# Patient Record
Sex: Female | Born: 1940 | Race: White | Hispanic: No | Marital: Married | Smoking: Never smoker
Health system: Southern US, Community
[De-identification: ages and names within clinical notes are randomized; demographics above are authoritative.]

## PROBLEM LIST (undated history)

## (undated) DIAGNOSIS — Z9221 Personal history of antineoplastic chemotherapy: Secondary | ICD-10-CM

## (undated) DIAGNOSIS — R0600 Dyspnea, unspecified: Secondary | ICD-10-CM

## (undated) DIAGNOSIS — Z9289 Personal history of other medical treatment: Secondary | ICD-10-CM

## (undated) DIAGNOSIS — Z923 Personal history of irradiation: Secondary | ICD-10-CM

## (undated) DIAGNOSIS — F419 Anxiety disorder, unspecified: Secondary | ICD-10-CM

## (undated) DIAGNOSIS — D649 Anemia, unspecified: Secondary | ICD-10-CM

## (undated) DIAGNOSIS — R011 Cardiac murmur, unspecified: Secondary | ICD-10-CM

## (undated) DIAGNOSIS — K5900 Constipation, unspecified: Secondary | ICD-10-CM

## (undated) DIAGNOSIS — R0602 Shortness of breath: Principal | ICD-10-CM

## (undated) DIAGNOSIS — M51369 Other intervertebral disc degeneration, lumbar region without mention of lumbar back pain or lower extremity pain: Secondary | ICD-10-CM

## (undated) DIAGNOSIS — D0510 Intraductal carcinoma in situ of unspecified breast: Secondary | ICD-10-CM

## (undated) DIAGNOSIS — M5136 Other intervertebral disc degeneration, lumbar region: Secondary | ICD-10-CM

## (undated) DIAGNOSIS — M1990 Unspecified osteoarthritis, unspecified site: Secondary | ICD-10-CM

## (undated) DIAGNOSIS — K219 Gastro-esophageal reflux disease without esophagitis: Secondary | ICD-10-CM

## (undated) DIAGNOSIS — C50411 Malignant neoplasm of upper-outer quadrant of right female breast: Secondary | ICD-10-CM

## (undated) DIAGNOSIS — I10 Essential (primary) hypertension: Secondary | ICD-10-CM

## (undated) DIAGNOSIS — E785 Hyperlipidemia, unspecified: Secondary | ICD-10-CM

## (undated) HISTORY — PX: ABDOMINAL HYSTERECTOMY: SHX81

## (undated) HISTORY — DX: Shortness of breath: R06.02

## (undated) HISTORY — DX: Intraductal carcinoma in situ of unspecified breast: D05.10

## (undated) HISTORY — DX: Unspecified osteoarthritis, unspecified site: M19.90

## (undated) HISTORY — DX: Gastro-esophageal reflux disease without esophagitis: K21.9

## (undated) HISTORY — DX: Hyperlipidemia, unspecified: E78.5

## (undated) HISTORY — DX: Malignant neoplasm of upper-outer quadrant of right female breast: C50.411

## (undated) HISTORY — DX: Essential (primary) hypertension: I10

## (undated) HISTORY — PX: OTHER SURGICAL HISTORY: SHX169

---

## 1999-03-01 HISTORY — PX: JOINT REPLACEMENT: SHX530

## 2000-05-30 ENCOUNTER — Encounter: Payer: Self-pay | Admitting: Orthopedic Surgery

## 2000-06-05 ENCOUNTER — Inpatient Hospital Stay (HOSPITAL_COMMUNITY): Admission: RE | Admit: 2000-06-05 | Discharge: 2000-06-10 | Payer: Self-pay | Admitting: Orthopedic Surgery

## 2001-02-28 HISTORY — PX: JOINT REPLACEMENT: SHX530

## 2002-02-28 HISTORY — PX: JOINT REPLACEMENT: SHX530

## 2005-05-18 ENCOUNTER — Ambulatory Visit: Payer: Self-pay | Admitting: Internal Medicine

## 2006-07-03 ENCOUNTER — Ambulatory Visit: Payer: Self-pay | Admitting: Internal Medicine

## 2006-07-10 ENCOUNTER — Inpatient Hospital Stay (HOSPITAL_COMMUNITY): Admission: RE | Admit: 2006-07-10 | Discharge: 2006-07-13 | Payer: Self-pay | Admitting: Orthopedic Surgery

## 2007-03-01 HISTORY — PX: COLONOSCOPY: SHX174

## 2007-07-11 ENCOUNTER — Ambulatory Visit: Payer: Self-pay | Admitting: Internal Medicine

## 2007-07-25 ENCOUNTER — Ambulatory Visit: Payer: Self-pay | Admitting: Internal Medicine

## 2007-08-03 ENCOUNTER — Ambulatory Visit: Payer: Self-pay | Admitting: General Surgery

## 2007-08-17 ENCOUNTER — Ambulatory Visit: Payer: Self-pay | Admitting: General Surgery

## 2007-09-04 ENCOUNTER — Other Ambulatory Visit: Payer: Self-pay

## 2007-09-04 ENCOUNTER — Ambulatory Visit: Payer: Self-pay | Admitting: General Surgery

## 2007-09-11 ENCOUNTER — Ambulatory Visit: Payer: Self-pay | Admitting: General Surgery

## 2008-03-17 ENCOUNTER — Ambulatory Visit: Payer: Self-pay | Admitting: Gastroenterology

## 2008-08-20 ENCOUNTER — Ambulatory Visit: Payer: Self-pay | Admitting: Internal Medicine

## 2008-11-27 ENCOUNTER — Inpatient Hospital Stay (HOSPITAL_COMMUNITY): Admission: RE | Admit: 2008-11-27 | Discharge: 2008-11-29 | Payer: Self-pay | Admitting: Orthopedic Surgery

## 2009-09-22 ENCOUNTER — Ambulatory Visit: Payer: Self-pay | Admitting: Internal Medicine

## 2010-01-29 ENCOUNTER — Encounter: Admission: RE | Admit: 2010-01-29 | Discharge: 2010-01-29 | Payer: Self-pay | Admitting: Orthopedic Surgery

## 2010-02-28 HISTORY — PX: BREAST BIOPSY: SHX20

## 2010-02-28 HISTORY — PX: CHOLECYSTECTOMY: SHX55

## 2010-06-03 LAB — BASIC METABOLIC PANEL
BUN: 14 mg/dL (ref 6–23)
BUN: 15 mg/dL (ref 6–23)
CO2: 28 mEq/L (ref 19–32)
Calcium: 8.3 mg/dL — ABNORMAL LOW (ref 8.4–10.5)
Chloride: 104 mEq/L (ref 96–112)
Chloride: 106 mEq/L (ref 96–112)
Creatinine, Ser: 0.91 mg/dL (ref 0.4–1.2)
GFR calc Af Amer: >60 mL/min (ref >60)
GFR calc non Af Amer: >60 mL/min (ref >60)
Glucose, Bld: 112 mg/dL — ABNORMAL HIGH (ref 70–99)
Glucose, Bld: 91 mg/dL (ref 70–99)
Potassium: 3.8 mEq/L (ref 3.5–5.1)
Potassium: 3.9 mEq/L (ref 3.5–5.1)
Sodium: 137 mEq/L (ref 135–145)

## 2010-06-03 LAB — CBC
HCT: 32 % — ABNORMAL LOW (ref 36.0–46.0)
HCT: 35.3 % — ABNORMAL LOW (ref 36.0–46.0)
Hemoglobin: 12.1 g/dL (ref 12.0–15.0)
MCHC: 34.4 g/dL (ref 30.0–36.0)
MCV: 94.5 fL (ref 78.0–100.0)
MCV: 95.9 fL (ref 78.0–100.0)
Platelets: 117 10*3/uL — ABNORMAL LOW (ref 150–400)
Platelets: 151 10*3/uL (ref 150–400)
RBC: 3.73 MIL/uL — ABNORMAL LOW (ref 3.87–5.11)
RDW: 13.1 % (ref 11.5–15.5)
RDW: 13.3 % (ref 11.5–15.5)
WBC: 15.3 10*3/uL — ABNORMAL HIGH (ref 4.0–10.5)

## 2010-06-04 LAB — COMPREHENSIVE METABOLIC PANEL
AST: 37 U/L (ref 0–37)
Albumin: 3.6 g/dL (ref 3.5–5.2)
Chloride: 104 mEq/L (ref 96–112)
Creatinine, Ser: 1.06 mg/dL (ref 0.4–1.2)
GFR calc Af Amer: >60 mL/min (ref >60)
Total Bilirubin: 1.6 mg/dL — ABNORMAL HIGH (ref 0.3–1.2)
Total Protein: 6.5 g/dL (ref 6.0–8.3)

## 2010-06-04 LAB — DIFFERENTIAL
Basophils Absolute: 0 10*3/uL (ref 0.0–0.1)
Basophils Relative: 0 % (ref 0–1)
Eosinophils Relative: 1 % (ref 0–5)
Lymphocytes Relative: 27 % (ref 12–46)
Neutro Abs: 4.8 10*3/uL (ref 1.7–7.7)

## 2010-06-04 LAB — URINALYSIS, ROUTINE W REFLEX MICROSCOPIC
Nitrite: NEGATIVE
Specific Gravity, Urine: 1.028 (ref 1.005–1.030)
Urobilinogen, UA: 0.2 mg/dL (ref 0.0–1.0)
pH: 6 (ref 5.0–8.0)

## 2010-06-04 LAB — CBC
HCT: 42.9 % (ref 36.0–46.0)
MCHC: 34.3 g/dL (ref 30.0–36.0)
Platelets: 168 10*3/uL (ref 150–400)
RDW: 13.1 % (ref 11.5–15.5)

## 2010-06-04 LAB — APTT: aPTT: 25 seconds (ref 24–37)

## 2010-06-04 LAB — BASIC METABOLIC PANEL
Calcium: 9.3 mg/dL (ref 8.4–10.5)
GFR calc Af Amer: 56 mL/min — ABNORMAL LOW (ref >60)
GFR calc non Af Amer: 47 mL/min — ABNORMAL LOW (ref >60)
Potassium: 3.3 mEq/L — ABNORMAL LOW (ref 3.5–5.1)
Sodium: 142 mEq/L (ref 135–145)

## 2010-06-04 LAB — CROSSMATCH: ABO/RH(D): O POS

## 2010-06-04 LAB — PROTIME-INR: INR: 0.9 (ref 0.00–1.49)

## 2010-07-16 NOTE — H&P (Signed)
Maria Gutierrez, Maria Gutierrez                 ACCOUNT NO.:  [id number]   MEDICAL RECORD NO.:  [id number]          PATIENT TYPE:  INP   LOCATION:  NA                           FACILITY:  [facility]   PHYSICIAN:  John L. Rendall, M.D.  DATE OF BIRTH:  [birth year]   DATE OF ADMISSION:  07/10/2006  DATE OF DISCHARGE:                              HISTORY & PHYSICAL   CHIEF COMPLAINT:  Painful left total knee replacement.   HISTORY OF PRESENT ILLNESS:  [name] is a 70 year old white female  with history of left total knee arthroplasty by Dr. Priscille Kluver in 2002.  The patient did well until the summer of 2007, when she started  developing left knee pain.  She denies any injury.  Pains became  progressively worse over the last year.  She describes the pain as achy  pain, worse with ambulation, better with Darvocet and crutches.  Labs  were obtained to rule out infection, i.e., sed rate and CRP, and these  were within normal limits.  X-rays of the left knee, however, showed  loosening of tibial component which has failed and now is in varus.   ALLERGIES:  1. VICODIN causes edema.  2. With PERCOCET, she feels she has an allergy, but is  unsure of what      the allergy is.  3. DEMEROL causes rash.  4. DILAUDID, she is unsure of allergy.  Feels she has allergy.  5. SULFA, again unsure, but has been told she has an allergy.   CURRENT MEDICATIONS:  1. Metoprolol ER 25 mg daily.  2. Meloxicam 15 mg one daily.  3. Hydrochlorothiazide 12.5 mg daily.  4. Darvocet-N 100, she takes daily q.4-6 h.  5. Lipitor 20 mg daily.  6. 81 mg of aspirin daily, stopped on June 03, 2006.  7. Caltrate 600 plus D 1 daily.  8. Allegra 180 mg as needed.  9. Fish oil 1000 mg twice daily.  10.Pepcid OTC p.r.n.   PAST MEDICAL HISTORY:  1. Essential hypertension.  2. Hyperlipidemia.  3. Reflux esophagitis.  4. Obesity.   PAST SURGICAL HISTORY:  1. Left total knee replacement in 2002, Dr. Priscille Kluver.  2. Hysterectomy.  3. Knee  arthroscopy, twice on the left knee and once on the right      knee. All knee procedures done by Dr. Priscille Kluver.  4. The patient denies any complications with anesthesia with the above      procedures.  Has had a blood transfusion with the left total knee      replacement in 2002.   SOCIAL HISTORY:  She denies any tobacco or alcohol use.  She lives with  her husband in a three-story home, two steps to regular entrance.   FAMILY HISTORY:  Mother deceased at age 64, history of heart disease,  MI, and stroke.  Father deceased at age 12 with heart disease and  history of stroke.  She has one living brother, age approximately 86,  hypertension. Two living sisters, age 20 with hypertension, coronary  artery disease with stent placement, and a 69 year old who is stated to  be in good health.   REVIEW OF SYSTEMS:  Reveals that the patient wears glasses at all times.  She has hypertension.  She has a history of anxiety, panic attacks. She  denies any recent fevers, chills or flu-like symptoms.  She does suffer  from recurrent sinus infections.  Otherwise, review of systems negative  or noncontributory.  Normal stress Cardiolite negative in October 2007  reported by her primary care physician.   PHYSICAL EXAMINATION:  GENERAL:  Patient is a well-developed, well-  nourished female in no acute distress.  She ambulates with the aid of a  crutch on the right side.  The patient's mood and affect are  appropriate.  She talks easily with the examiner.  VITAL SIGNS:  Temperature 99.2, pulse is 70, blood pressure 120/80,  respiratory rate 16.  CARDIAC:  Regular rate and rhythm.  No murmurs, rubs, or gallops noted.  LUNGS:  Clear to auscultation bilaterally.  No wheezing, rhonchi, or  rales noted.  ABDOMEN:  Obese.  Bowel sounds x4 quadrants.  Soft, nontender  throughout.  HEENT: Head is normocephalic, atraumatic, without frontal or maxillary  sinus tenderness to palpation.  Conjunctiva is pink.  Sclerae  is  anicteric.  PERLA.  EOMs are intact.  No external ear deformities.  TMs  pearly and gray bilaterally.  Nose: Nasal septum midline.  Nasal mucosa  pink, moist, without polyps.  Buccal mucosa pink, moist.  The patient  has good dentition.  Pharynx without erythema or exudate.  Tongue, uvula  midline.  NECK:  Carotids are 2+ bilaterally, without bruits.  No tenderness with  palpation over the midline.  She has full range of motion of the  cervical spine without pain.  BACK:  N tenderness to palpation over the thoracic or lumbar spine.[name]  NEUROLOGIC:  Patient is alert and oriented x3.  Cranial nerves II-XII  grossly intact.  Lower extremity strength testing reveals 5/5 strength  throughout.  Deep tendon reflexes are 2+ bilaterally at the knees and  ankles and are equal and symmetric.  She has good sensation to light  touch throughout.  BREASTS/GENITOURINARY/RECTAL:  All deferred at this time.  MUSCULOSKELETAL:  She has full range of motion of the upper extremities,  without pain.  Upper extremities are equal and symmetric in size and  shape throughout.  Radial pulses are 2+.  LOWER EXTREMITIES:  She has full painless range of motion of both hips.  Both hips flex to 90 degrees easily without pain.  Left knee:  0-95  degrees of flexion. Well-healed surgical incision.  She has tenderness  along the medial joint line.  Valgus and varus stressing reveals no  instability.  No effusion.  No edema is noted.  Right knee:  0-97  degrees of flexion.  5 degrees of varus deformity.  She has tenderness  along the medial joint line to palpation.  No effusion.  No edema.  Valgus and varus stressing reveals no laxity.  Calves nontender  bilaterally.  Lower extremities are nonedematous.  Dorsal pedal pulses  are 2+ bilaterally.  Posterior tibial pulse are 2+ bilaterally, equal  and symmetric.   IMPRESSION: 1. Failed left total knee arthroplasty, with loosening of the tibial      component.  2. Benign  essential hypertension.  3. Esophageal reflux.  4. Hyperlipidemia.  5. Obesity.  6. Osteoarthritis right knee.  7. Anxiety disorder.   PLAN:  The patient is to be admitted on Jul 10, 2006 to [facility]  to undergo a revision of her left total knee.  Prior to  surgery, the patient did receive surgical clearance from Dr. Yves Dill  from [place], [place].  The patient has undergone preoperative  lab testing prior to surgery.      Richardean Canal, P.A.      John L. Rendall, M.D.  Electronically Signed    GC/MEDQ  D:  06/29/2006  T:  06/29/2006  Job:  [id number]

## 2010-07-16 NOTE — H&P (Signed)
[place]  Patient:    Maria Gutierrez, Maria Gutierrez                          MRN: [id number] Adm. Date:  06/05/00 Attending:  Jonny Ruiz L. Dorothyann Gibbs, M.D. Dictator:   Arnoldo Morale, P.A.C.                         History and Physical  DATE OF BIRTH:  May 23, 1940  CHIEF COMPLAINT:  Progressively worsening left knee pain.  HISTORY OF PRESENT ILLNESS:  This 70 year old white female patient presented to Dr. Priscille Kluver with a long history of bilateral knee pain.  At this point, the left is much worse than the right.  She has a history of three knee arthroscopies on the left by Dr. Priscille Kluver, with her last one being in 1998. Since 1998, the pain in her left knee has been getting progressively worse.  At this point, the pain in the left knee is described as an intermittent throb located mostly about the medial aspect of the joint with radiation into her posterior calf.  It increases with any walking, and decreases with rest and the use of a jacuzzi on the knee.  The pain does increase at night.  She has a lot of stiffness in the knee, especially after standing for any length of time.  The knee does pop, catch, grind, lock, and give way at times.  She does have to use a crutch to ambulate p.r.n.  She has no edema or paresthesias associated with the pain.  She did get cortisone shots in both knees in March or April, and that only helped for a few days.  She is currently taking one Darvocet two to three times a day for relief of the pain, in addition to Vioxx.  ALLERGIES:  VICODIN caused edema of her lips.  CURRENT MEDICATIONS:  1. Vioxx 25 mg one tablet p.o. q.d.  2. Zestoretic 20/12 mg one tablet p.o. q.d.  3. Toprol XL 50 mg one tablet p.o. q.d.  4. Lipitor 10 mg one tablet p.o. q.d.  5. Zyrtec 10 mg one tablet p.o. q.d. p.r.n. congestion.  6. Darvocet-N 100 mg one tablet p.o. b.i.d. to t.i.d. p.r.n. pain.  7. Vivelle estrogen patch 0.05 mg one patch applied q.d.  8.  Cafergot tablets one tablet p.o. p.r.n. migraine headaches.  9. Ecotrin 81 mg one tablet p.o. q.d., last dose May 30, 2000. 10. Flonase nasal spray 50 mcg per spray 2 sprays in each naris inhaled q.d.  PAST MEDICAL HISTORY: 1. She was diagnosed with hypertension a year ago. 2. History of migraines, but none since she has been using the Darvocet and    Vioxx. 3. History of allergy and sinus congestion. 4. History of hypercholesterolemia.  She denies any history of diabetes mellitus, thyroid disease, hiatal hernia, peptic ulcer disease, heart disease, asthma, or any other chronic medical condition other than noted previously.  PAST SURGICAL HISTORY: 1. Left knee arthroscopy on November 12, 1985, by Dr. Jonny Ruiz L. Rendall. 2. Left knee arthroscopy on May 25, 1988, by Dr. Jonny Ruiz L. Rendall. 3. Excision of a pump bump on the right heel by Dr. Claude Manges. [place] on    November 16, 1990. 4. Total vaginal hysterectomy by Dr. Annett Fabian on September 13, 1994. 5. Left knee arthroscopy by Dr. Jonny Ruiz L. Rendall on June 26, 1996.  SOCIAL HISTORY:  She denies  any history of cigarette smoking, alcohol use, or drug use.  She is married and has one daughter.  She and her husband live in a one story house with two steps into the main entrance.  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 Her medical doctor is Dr. Margaretann Loveless, and his phone number is [phone].  FAMILY HISTORY:  Her mother is alive at age 22 with heart disease, hypertension, and Alzheimers disease.  Her father is alive at age 10 with heart disease, stroke, and hypertension.  She has one brother age 59 who has hypertension, and two sisters age 49 and 47 who both have hypertension.  Her daughter is age 70 and she is alive and healthy.  REVIEW OF SYSTEMS:  She does have some ear infections occasionally due to excessive build up of ear wax.  She has that removed every couple of  months. She does report a 6 pound weight gain over the last two months.  She does wear glasses.  She does have some urinary urgency.  All other systems are negative and noncontributory at this time.  PHYSICAL EXAMINATION:  GENERAL:  A well-developed, well-nourished, overweight white female in no acute distress.  Talks easily and appropriately with the examiner.  Mood and affect are appropriate.  Height is 5 feet 6 inches, weight 250 pounds, BMI 39.5.  VITAL SIGNS:  Temperature 99.1 degrees Fahrenheit, pulse 64, respirations 20, blood pressure 108/80.  HEENT:  Normocephalic, atraumatic without frontal or maxillary sinus tenderness to palpation.  Conjunctivae pink, sclerae anicteric.  Pupils are equal, round and reactive to light and accommodation.  Extraocular movements intact.  Funduscopic exam shows visible red reflex bilaterally with normal retinal vasculature, optic disks, and cup.  No visible external ear deformities.  Hearing grossly intact.  Tympanic membranes pearly gray bilaterally with good light reflex.  Nose and nasal septum midline.  Mucus membranes moist and pink without exudates or polyps noted.  Buccal mucosa pink and moist.  Good dentition.  Pharynx without erythema or exudates.  Tongue and uvula midline.  Tongue without fasciculations and uvula rises equally with phonation.  NECK:  No visible masses or lesions noted.  Trachea midline.  No palpable lymphadenopathy nor thyromegaly.  Carotids are +2 bilaterally without bruits. Full range of motion and nontender to palpation along the cervical spine.  CARDIOVASCULAR:  Heart rate and rhythm regular.  S1 and S2 present without rubs, clicks, or murmurs noted.  RESPIRATORY:  Respirations even and unlabored.  Breath sounds clear to auscultation bilaterally without rales or wheezes noted.  ABDOMEN:  Rounded abdominal contour.  Bowel sounds present x 4 quadrants. Soft and nontender to palpation without hepatosplenomegaly,  nor CVA tenderness.  Femoral pulses are +2 bilaterally.  Nontender to palpable along the vertebral collum.   BREASTS/GU/RECTAL/PELVIC:  These exams deferred at this time.  MUSCULOSKELETAL:  No obvious deformities, bilateral upper extremities with full range of motion of these extremities without pain.  Radial pulses are +2. She has full range of motion of her hips, ankles, and toes bilaterally with DP and PT pulses +2.  She is only able to bend both hips to about 90 degrees, however, with full extension.  Her left knee has full extension with flexion to 80 degrees.  She has pain on both the medial and lateral joint line with the medial joint line being much more painful than lateral.  There is no effusion at this time.  Minimal crepitus  with range of motion.  Collateral ligaments are stable.  Her right knee is lacking 5 to 10 degrees of full extension, can flex only to 70 degrees at this time.  She has pain only on the medial joint line with palpation.  No effusion.  No crepitus with range of motion, and collateral ligaments stable.  NEUROLOGIC:  Alert and oriented x 3.  Cranial nerves II-XII grossly intact. Strength is 5/5 bilateral upper and lower extremities.  Rapid alternating movements intact.  Deep tendon reflexes 3+ bilateral upper and lower extremities.  Rapid alternating movements intact.  Sensation intact to light touch.  RADIOLOGIC FINDINGS:  X-rays taken of both knees in January 2001, show severe osteoarthritis of both knees with bone-on-bone contact in the medial compartment.  There is a varus deformity and spurs noted medially.  IMPRESSION: 1. End-stage osteoarthritis, bilateral knees, left worse than right. 2. Hypertension. 3. Hypercholesterolemia. 4. History of allergies and sinus congestion. 5. History of migraines.  PLAN:  Maria Gutierrez will be admitted to [facility] on June 05, 2000, where she will undergo a left total knee arthroplasty by Dr. Jonny Ruiz L.  Rendall. She will undergo all the routine preoperative laboratory tests and studies prior to this procedure. DD:  05/30/00 TD:  05/30/00 Job: [id number]

## 2010-07-16 NOTE — Op Note (Signed)
NAMECHENITA, RUDA                 ACCOUNT NO.:  [id number]   MEDICAL RECORD NO.:  [id number]          PATIENT TYPE:  INP   LOCATION:  X001                         FACILITY:  [facility]   PHYSICIAN:  John L. Rendall, M.D.  DATE OF BIRTH:  Mar 21, 1940   DATE OF PROCEDURE:  07/10/2006  DATE OF DISCHARGE:                               OPERATIVE REPORT   PREOPERATIVE DIAGNOSIS:  Failure of tibial component, left total knee.   SURGICAL PROCEDURES:  Revision tibial component and patellar  polyethylene left total knee.   POSTOPERATIVE DIAGNOSIS:  Failure of tibial component, left total knee.   SURGEON:  John L. Rendall, M.D.   ASSISTANTAlisa Graff, PAC   ANESTHESIA:  General.   PATHOLOGY:  The patient has a knee that was in marked varus  preoperatively before her first total knee.  Her total knee done in 2002  was well-aligned and well-fixed, but it has fallen into pretty dermatic  varus with appearance of just crumbling of the medial tibial plateau  beneath the tibial tray.   PROCEDURE:  Under general anesthesia the left leg is prepared with  DuraPrep and draped as a sterile field.  The femoral nerve block was  also obtained.  Using the previous incision the patella was everted  after routine prep and drape.  The tibial tray had the polyethylene  removed and the tibial component then was undermined slightly with an  osteotome and was grossly loose and easily removed.   A synovectomy was carried out and the femoral component showed slight  undermining for about 2 mm circumferentially with synovitis.  However,  it was well-fixed.  There was no evidence of knife blade entering a cyst  or large lucent area under the femoral component.  The patellar  component was also well-fixed.  The patella button was removed.   With the tibial component out large pieces of cement were removed with  an osteotome and mallet.  The center cement plug was removed.  Canal  finder used, reamers were used up to  12 mm and then appropriate clean-up  cut was made removing approximately 50% of the proximal tibia to a depth  of 2-3 mm.  This was in the lateral 50% where there was still bone  present.  The medial side was quite defective.   The decision was made to go with the sleeve, porous sleeve and a step  wedge to repair the bone defect and give the best fit.  Reaming was then  done for the porous sleeve 29 mm.  The step wedge cut was then made and  trialing of the component revealed the step wedge cut was made a little  too low.  Several more millimeters were then removed from the lateral  proximal tibia and trialing then revealed excellent fit, alignment and  stability of the new tibial tray and this was with a 17.5 bearing.  It  should be noted that great care was used to remove all the synovitis and  foreign body reaction.  A Gram's stain was obtained and the report came  back during  the procedure; a few poly's seen, no organisms and culture  and sensitivity were also done but the joint fluid was just slightly  cloudy yellow consistent with foreign body reaction.   At this point with the trial appropriately sized the cement was mixed  with vancomycin 1 gram in each pack.  The permanent component was then  inserted with cement around the proximal tray and the sleeve.  It was  impacted and once properly seated the 17.5 bearing was placed in on top  of it.  The patellar button was placed on the metal backed patella and  the joint capsule was then closed with towel clips while cement  hardened.  Once cement hardened the knee was opened, tourniquet let down  at approximately one hour and 30 minutes.  Multiple small vessels were  cauterized.  A medium Hemovac drain was inserted.  The knee seemed a  little tighter medially than laterally still at this point and partial  release of the pes anserinus corrected this.   At this point the knee was then closed in layers with number one Tycron,  0-0 and  2-0 Vicryl and skin clips.  Total time approximately 2 hours.  The patient tolerated the procedure well and returned to recovery in  good condition.  Arnoldo Morale, PAC assisted in all portions of the  procedure and was there throughout the procedure.      John L. Rendall, M.D.  Electronically Signed     JLR/MEDQ  D:  07/10/2006  T:  07/10/2006  Job:  [id number]

## 2010-07-16 NOTE — Op Note (Signed)
[place]  Patient:    Maria Gutierrez, Maria Gutierrez                        MRN: [id number] Proc. Date: 06/05/00 Adm. Date:  [id number] Attending:  Carolan Shiver Ii                           Operative Report  PREOPERATIVE DIAGNOSIS:  Osteoarthritis left knee.  PROCEDURE:  Left LCS total knee replacement.  POSTOPERATIVE DIAGNOSIS:  Osteoarthritis left knee.  SURGEON:  John L. Dorothyann Gibbs, M.D.  ASSISTANT:  Arnoldo Morale, P.A.  ANESTHESIA:  General anesthesia.  PATHOLOGY:  The patient has end-stage osteoarthritis of the knee with bone against bone medial compartment and significant patellofemoral wear as well. Her knee lacks 7 degrees extension.  DESCRIPTION OF PROCEDURE:  Under general anesthesia, the leg was prepared with Duraprep and draped as a sterile field.  It is wrapped out with an Esmarch and a sterile tourniquet is elevated at 350 mm.  Midline incision is made going medial parapatellar deep.  Patella is everted and multiple small vessels are cauterized.  The femur is sized to the standard.  Proximal tibial resection is carried out using the first femoral guide and then a condylar drill hole is placed using a second guide.  The anterior and posterior flare of the distal femur were resected with a 10 mm flexion gap.  Using the intermedullary guide, a distal femoral cut is made with a 10 mm extension gap.  Recessing guide is then used.  Following this a laminal spreader is inserted.  The remnants of the cruciates and the menisci are resected and spurs of the back of the femoral condyles are removed.  Following this, the proximal tibia is exposed, center peg hole is placed for a 2.5 size tibial tray.  Trial reduction of a 2.5 tibia, 10 mm rotating bearing, and standard femur reveals excellent fit, alignment, and stability.  The patella is then osteotomized and then three peg-hole component is trialed and fits well.  Following this, cement is  mixed. The tibial component is cemented in place.  Femur and patella are press-fit. Following this, the tourniquet is let down at one hour.  Multiple small vessels are cauterized.  The wound is then closed in layers with #1 Tycron, 0 and 2-0 Vicryl, and skin clips.  The patient returned to the recovery room in good condition. DD:  06/05/00 TD:  06/05/00 Job: [id number]

## 2010-07-16 NOTE — Discharge Summary (Signed)
NAMEZOEI, AMISON                 ACCOUNT NO.:  [id number]   MEDICAL RECORD NO.:  [id number]          PATIENT TYPE:  INP   LOCATION:  1610                         FACILITY:  [facility]   PHYSICIAN:  John L. Rendall, M.D.  DATE OF BIRTH:  Feb 02, 1941   DATE OF ADMISSION:  07/10/2006  DATE OF DISCHARGE:  07/13/2006                               DISCHARGE SUMMARY   ADMISSION DIAGNOSES:  1. Failure of the tibial prosthesis.  2. Total knee arthroplasty on left.   DISCHARGE DIAGNOSIS:  1. Failed left total knee arthroplasty, tibia.  2. Essential hypertension.  3. Hyperlipidemia.  4. Reflux esophagitis.  5. Exogenous obesity.   PROCEDURE:  Revision, tibial tray, left total knee.   HISTORY:  Ms. Kitch is a 70 year old white female with a history of  left total knee arthroplasty done by Dr. Priscille Kluver in 2002.  The patient  did well until the summer of 2007, when she started developing pain in  the left knee.  She denies any history of injury or trauma.  Pain became  progressively worse over the last year.  She describes the pain as an  achy pain, worse with ambulation, better with Darvocet and crutches.  Labs were obtained to rule out an infection, and they were normal.  X-  rays, however, showed loosening of the tibial component, which is felt  at now in varus.  Admitted this time for revision.   HOSPITAL COURSE:  A 70 year old female admitted Jul 10, 2006, after  appropriate laboratory studies were obtained, as well as __________  on  call to the operating room.  Was taken to the operating room where she  underwent a revision and total knee arthroplasty of a  tibial plate by  Dr. Erasmo Leventhal, assisted by Nathanial Rancher PA-C.  She tolerated the  procedure well.  She was continued on Ancef 1 gram IV q.6 h. x6 doses.  This was because of a revision nature and the higher chance of  infection.  She was also started on Arixtra 2.5 mg q.8 p.m. x7 days;  Darvocet N-100 1-2 q.4-6 h. __________  6  per day was allowed.  A  reduced dose of morphine PCA was also used.  Consults for PT/OT, care  management were made.  __________  of 0-90 degrees for 6-8 hours per day  was ordered.  Weightbearing as tolerated and PT.  Foley was placed  intraoperatively.  She was allowed out of bed to a chair the following  day.  She had Arixtra instructions.  Genevieve Norlander was ordered for home  health.  Post-op day 2, her dressings were changed, revealing the wounds  to be clean and dry without signs of infection.  She was having some  problems with pain management, until we started her on Darvon 65 mg p.o.  q4 h. p.r.n.  She can use Tylenol 650 mg p.o. q.6 h. p.r.n. pain also.  This has been beneficial.  She, otherwise had an unremarkable hospital  course and is discharged today to follow back up into the office at 2  week's followup.   LABORATORY  STUDIES:  Hemoglobin of 13.8, hematocrit 40.0%, white count  9,000, platelets 218,000.  Discharge hemoglobin 11.2, hematocrit 32.9%,  white count 8,300, platelets 469,000.  Pre-op sodium 141, potassium 4.3,  chloride 104, CO2 29, glucose 102, BUN 21, creatinine 0.3.  GFR was 54,  total protein 7.0, BUN 3.7, AST 23, ALT 18, ALP 75 and total bilirubin  0.7.  Discharge sodium 139, potassium 3.5, chloride 105, CO2 29, glucose  93, BUN 9, creatinine 0.99.  Urinalysis is benign for __________  urine.  Blood type is O positive, antibody screen negative.  Urine cultured  showed 40,000 colonies of mixed bacteria.  Cultures were aerobic and  anaerobic of the left knee or no growth at the time of this dictation.   DISCHARGE INSTRUCTIONS:  She is allowed to increase her activity slowly.  Have a regular diet.  Crutches ambulating, weightbearing as tolerated.  CPM 0-90 degrees or 6 to 8 hours per day.  She may shower on Friday.  No  lifting or driving for 6 weeks.  Follow the blue instruction sheet.  Change dressing daily.  Darvon 65, one tab every 4 hours as needed for  pain.  Arixtra 2.5 mg, take daily at 8:00 p.m. as instructed, last dose  Jul 17, 2006.  Begin aspirin 81 mg daily on Jul 18, 2006.  Robaxin 500  mg, one tablet every 4 hours as needed for spasms.  Lipitor 20 mg, take  one tab each evening.  Metoprolol ER 50 mg, take 1/2 tab daily.  Hydrochlorothiazide 25 mg, take 1/2 tab daily.  Fexofenadine 180, take  one tablet daily as needed.  Caltrate 600, take one daily.  Fish oil  1,000 mg, take one daily.  Pepcid 20 mg, take one daily as needed.   She will need to follow up with Dr. Priscille Kluver on Jul 25, 2006.  She will  need to call the office and obtain an appointment.  [facility] for her home  health.  Discharged in improved condition.      Oris Drone Petrarca, P.A.-C.      Carlisle Beers. Rendall, M.D.  Electronically Signed    BDP/MEDQ  D:  07/13/2006  T:  07/13/2006  Job:  [id number]

## 2010-07-16 NOTE — Discharge Summary (Signed)
[place]  Patient:    Maria Gutierrez, Maria Gutierrez                        MRN: [id number] Adm. Date:  [id number] Disc. Date: [id number] Attending:  Carolan Shiver Ii Dictator:   Arnoldo Morale, P.A.                           Discharge Summary  ADMISSION DIAGNOSES:  1. End-stage osteoarthritis bilateral knees, left worse than right.  2. Hypertension.  3. Hypercholesterolemia.  4. History of allergies and sinus congestion.  5. History of migraines.  DISCHARGE DIAGNOSES:  1. End-stage osteoarthritis bilateral knees, left worse than right.  2. Hypertension.  3. Hypercholesterolemia.  4. History of allergies and sinus congestion.  5. History of migraines.  6. Posthemorrhagic anemia.  7. Orthostatic hypotension.  8. Pruritus and sedation secondary to pain medications.  9. Urinary retention. 10. Constipation.  SURGICAL PROCEDURE:  On June 05, 2000, the patient underwent a left total knee arthroplasty by Dr. Jonny Ruiz L. Rendall, assisted by Arnoldo Morale P.A.C.  COMPLICATIONS:  None.  CONSULTS: 1. Pharmacy consults, Coumadin therapy June 05, 2000. 2. Rehabilitation medicine, case management and physical therapy consult    June 06, 2000. 3. Occupational therapy consult, June 07, 2000.  HISTORY OF PRESENT ILLNESS:  This is a 70 year old white female who presented to Dr. Priscille Kluver with a long history of bilateral knee pain.  At this point, the left is worse than the right.  She has a history of multiple arthroscopies on the left in the past with the last one being in 1998.  The pain in the knee is described as intermittent throb in the medial aspect of the joint with radiation to the posterior calf.  Pain increases with any walking and decreases with rest.  She does have night pain and a lot of stiffness in the knee.  The knee pops, catches, grinds and locks and gives way at time.  She is using a crutch to ambulate p.r.n.  She has failed conservative treatment  at this time and because of this, she is presenting for a left total knee replacement.  HOSPITAL COURSE:  The patient tolerated her surgical procedure well without immediate postoperative complications.  On postoperative day #1, her blood pressure was low, but her hemoglobin was 10.3 with a hematocrit of 29.5.  She was tolerating CPM.  She did complain of some difficulty with pain control and she was placed on Vioxx to assist with pain control.  On postoperative day #2, she did complain of some pruritus with the Percocet. She was switched to Mepergan.  T-max was 100.1, vitals were stable.  Left knee incision well approximated.  Hemoglobin 9.8, hematocrit 28.5.  Her PT was 21 and her INR was 2.4.  She was continued on therapy per protocol.  On June 08, 2000, she had difficulty with increased sedation on the [facility], so she was switched to Dilaudid.  Her T-max was 100.5.  She has been unable to void and required straight cath several times.  She was started on Urecholine 10 mg p.o. t.i.d. for three days.  She did complain of some constipation and was treated with ______ for that.  She did have some difficulty with being light-headed later in the day, so she was subsequently transfused with 2 units of packed red blood cells.  On June 09, 2000, she was feeling a  little bit better.  Constipation had resolved.  T-max was 99.3, vitals were stable.  Hemoglobin 11.2, hematocrit 32.5.  Left knee wound was unchanged.  She was continued on therapy and monitored closely.  On June 10, 2000, she felt well enough to be discharged home later in the day.  She was doing well with therapy and was able to be discharged.  DISCHARGE INSTRUCTIONS: 1. She is to resume all pre-hospitalization medications and diet with the    exception of her Ecotrin and Cafergot. 2. Additional medications now include Coumadin 1 tablet p.o. q.d. with a dose    per pharmacy, Darvocet-N 100 one to two tablets p.o. q.4h.  p.r.n. for pain,    50 with no refill, Vioxx 25 mg one tablet p.o. q.d. 30 with one refill. 3. She is to be weightbearing as tolerated on the left leg with the use of the    walker and is to keep her incision clean and dry. 4. She has arranged for home health physical therapy, RN, and pharmacy to    monitor her protimes, Coumadin therapy, and do her physical therapy. 5. She is to follow up with Dr. Priscille Kluver on Tuesday, April 23 and is to call    [phone] to set up that appointment. 6. She is to notify Dr. Priscille Kluver if temperature greater than 101.5, chills,    pain unrelieved by pain meds or foul smelling drainage from the wound.  LABORATORY DATA:  On June 05, 2000, hemoglobin 11.6, hematocrit 33.8.  April 9, hemoglobin 10.3, hematocrit 29.5.  On April 10, hemoglobin 9.8, hematocrit 28.5, platelets 148.  On April 11, hemoglobin 9.6, hematocrit 27, and on April 12, white count 6.5, hemoglobin 11.2, hematocrit 32.5 and platelets 197.  On April 2, her PT was 12.6, INR was 1, PTT 29.  On April 13, PT 23.4 seconds, INR 2.9.  On April 9, glucose 116, BUN 24, creatinine 1.1.  On April 10, sodium 136, potassium 3.4, chloride 104, CO2 27, glucose 112, BUN 16, creatinine 1.1 and calcium 8.4.  All other laboratory studies were within normal limits. DD:  06/23/00 TD:  06/25/00 Job: [id number]

## 2010-09-14 ENCOUNTER — Ambulatory Visit: Payer: Self-pay | Admitting: Internal Medicine

## 2011-07-28 ENCOUNTER — Other Ambulatory Visit (HOSPITAL_COMMUNITY)
Admission: RE | Admit: 2011-07-28 | Discharge: 2011-07-28 | Disposition: A | Discharge status: Home / Self Care | Payer: Medicare Other | Source: Ambulatory Visit | Attending: Dental General Practice | Admitting: Dental General Practice

## 2011-07-28 DIAGNOSIS — D1039 Benign neoplasm of other parts of mouth: Secondary | ICD-10-CM | POA: Insufficient documentation

## 2011-10-10 ENCOUNTER — Ambulatory Visit: Payer: Self-pay | Admitting: Internal Medicine

## 2012-01-17 DIAGNOSIS — M1990 Unspecified osteoarthritis, unspecified site: Secondary | ICD-10-CM | POA: Insufficient documentation

## 2012-02-29 HISTORY — PX: EYE SURGERY: SHX253

## 2012-10-10 ENCOUNTER — Ambulatory Visit: Payer: Self-pay | Admitting: Family Medicine

## 2012-11-08 ENCOUNTER — Ambulatory Visit: Payer: Self-pay | Admitting: Unknown Physician Specialty

## 2013-06-21 ENCOUNTER — Emergency Department: Payer: Self-pay | Admitting: Emergency Medicine

## 2013-11-18 ENCOUNTER — Ambulatory Visit: Payer: Self-pay | Admitting: Family Medicine

## 2013-11-19 ENCOUNTER — Ambulatory Visit: Payer: Self-pay | Admitting: Unknown Physician Specialty

## 2013-11-20 ENCOUNTER — Ambulatory Visit: Payer: Self-pay | Admitting: Family Medicine

## 2013-12-23 DIAGNOSIS — M1990 Unspecified osteoarthritis, unspecified site: Secondary | ICD-10-CM | POA: Insufficient documentation

## 2014-01-31 DIAGNOSIS — S62319A Displaced fracture of base of unspecified metacarpal bone, initial encounter for closed fracture: Secondary | ICD-10-CM | POA: Insufficient documentation

## 2014-02-28 DIAGNOSIS — Z923 Personal history of irradiation: Secondary | ICD-10-CM

## 2014-02-28 DIAGNOSIS — Z9221 Personal history of antineoplastic chemotherapy: Secondary | ICD-10-CM

## 2014-02-28 HISTORY — DX: Personal history of antineoplastic chemotherapy: Z92.21

## 2014-02-28 HISTORY — DX: Personal history of irradiation: Z92.3

## 2014-05-22 ENCOUNTER — Ambulatory Visit: Payer: Self-pay | Admitting: Family Medicine

## 2014-05-26 ENCOUNTER — Ambulatory Visit: Admit: 2014-05-26 | Disposition: A | Discharge status: Home / Self Care | Payer: Self-pay | Admitting: Family Medicine

## 2014-05-26 HISTORY — PX: BREAST BIOPSY: SHX20

## 2014-06-02 ENCOUNTER — Encounter: Payer: Self-pay | Admitting: General Surgery

## 2014-06-03 ENCOUNTER — Encounter: Payer: Self-pay | Admitting: General Surgery

## 2014-06-03 ENCOUNTER — Ambulatory Visit (INDEPENDENT_AMBULATORY_CARE_PROVIDER_SITE_OTHER): Payer: Medicare HMO | Admitting: General Surgery

## 2014-06-03 ENCOUNTER — Other Ambulatory Visit: Payer: Self-pay | Admitting: General Surgery

## 2014-06-03 VITALS — BP 162/84 | HR 72 | Resp 18 | Ht 63.0 in | Wt 290.0 lb

## 2014-06-03 DIAGNOSIS — D0511 Intraductal carcinoma in situ of right breast: Secondary | ICD-10-CM | POA: Diagnosis not present

## 2014-06-03 DIAGNOSIS — D0510 Intraductal carcinoma in situ of unspecified breast: Secondary | ICD-10-CM

## 2014-06-03 HISTORY — DX: Intraductal carcinoma in situ of unspecified breast: D05.10

## 2014-06-03 NOTE — Patient Instructions (Signed)
Patient is scheduled for surgery at [facility] on 06/10/14. She will pre admit at the hospital on 06/05/14 at 7:30 am. She will arrive on 06/10/14 at the [facility] at 9:30 am. Patient is aware of dates, times, and all instructions.

## 2014-06-03 NOTE — Progress Notes (Signed)
Patient ID: Maria Gutierrez, female   DOB: [birth year], 74 y.o.   MRN: [id number]  Chief Complaint  Patient presents with  . Other    Abnormal mammogram     HPI Maria Gutierrez is a 74 y.o. female who presents for a breast evaluation. The most recent mammogram was done on 05/22/14. Patient had a right breast stereo biopsy done on 05/26/14 biopsy showed cancer.  Patient does perform regular self breast checks and gets regular mammograms done.    The patient's husband, Maria Gutierrez was present for the interview and exam.  HPI  Past Medical History  Diagnosis Date  . Hyperlipidemia   . Hypertension   . GERD (gastroesophageal reflux disease)   . Arthritis     Past Surgical History  Procedure Laterality Date  . Abdominal hysterectomy    . Kneee surgery Left   . Cholecystectomy  2012  . Breast biopsy Left 2015  . Colonoscopy  2009    No family history on file.  Social History History  Substance Use Topics  . Smoking status: Never Smoker   . Smokeless tobacco: Not on file  . Alcohol Use: No    No Known Allergies  Current Outpatient Prescriptions  Medication Sig Dispense Refill  . cholecalciferol (VITAMIN D) 1000 UNITS tablet Take 1,000 Units by mouth daily.    [name] doxycycline (VIBRA-TABS) 100 MG tablet     . hydrochlorothiazide (HYDRODIURIL) 25 MG tablet     . lansoprazole (PREVACID) 15 MG capsule Take 15 mg by mouth daily at 12 noon.    . meloxicam (MOBIC) 15 MG tablet     . metoprolol succinate (TOPROL-XL) 50 MG 24 hr tablet Take 50 mg by mouth daily. Take with or immediately following a meal.    . PARoxetine (PAXIL) 40 MG tablet Take 40 mg by mouth every morning.    . simvastatin (ZOCOR) 40 MG tablet Take 40 mg by mouth daily.    . traMADol (ULTRAM) 50 MG tablet Take by mouth every 6 (six) hours as needed.     No current facility-administered medications for this visit.    Review of Systems Review of Systems  Constitutional: Negative.   Respiratory: Negative.    Cardiovascular: Negative.     Blood pressure 162/84, pulse 72, resp. rate 18, height 5\' 3"  (1.6 m), weight 290 lb (131.543 kg).  Physical Exam Physical Exam  Constitutional: She is oriented to person, place, and time. She appears well-developed and well-nourished.  Eyes: Conjunctivae are normal. No scleral icterus.  Neck: Neck supple.  Cardiovascular: Normal rate, regular rhythm and normal heart sounds.   Pulmonary/Chest: Effort normal and breath sounds normal. Right breast exhibits no inverted nipple, no mass, no nipple discharge, no skin change and no tenderness. Left breast exhibits no inverted nipple, no mass, no nipple discharge, no skin change and no tenderness.  Abdominal: There is no tenderness.  Lymphadenopathy:    She has no cervical adenopathy.    She has no axillary adenopathy.  Neurological: She is alert and oriented to person, place, and time.  Skin: Skin is warm and dry.    Data Reviewed Mammograms dating back 3 2012 were reviewed as well as accompanying reports from 2015 through 2016. An area of indeterminate calcifications in the right breast were again identified on 05/22/2014 for which biopsy was recommended. The patient subsequently underwent stereotactic biopsy by the radiology service on 05/26/2014.  Pathology showed DCIS, solid pattern with focal sentinel necrosis, intermediate grade. Upper 1.5 cm in  diameter.    Assessment    DCIS of the right breast.    Plan    The pathology associated with DCIS was reviewed. Based on the area of mammography showing it to-3 cm area of stranding microcalcifications, she will be best served by bracketing wires to completely remove the areas of concern.  The role of post surgery radiation therapy was briefly touched upon.  The patient may continue her anti-inflammatory (Mobic) prior to surgery.       Patient is scheduled for surgery at [facility] on 06/10/14. She will pre admit at the hospital on 06/05/14 at 7:30 am. She will  arrive on 06/10/14 at the [facility] at 9:30 am. Patient is aware of dates, times, and all instructions.   PCP:  Virgie Dad 06/03/2014, 4:47 PM

## 2014-06-05 ENCOUNTER — Telehealth: Payer: Self-pay

## 2014-06-05 ENCOUNTER — Other Ambulatory Visit: Payer: Self-pay

## 2014-06-05 ENCOUNTER — Ambulatory Visit
Admit: 2014-06-05 | Disposition: A | Discharge status: Home / Self Care | Payer: Self-pay | Attending: General Surgery | Admitting: General Surgery

## 2014-06-05 LAB — CBC WITH DIFFERENTIAL/PLATELET
BASOS PCT: 0.6 %
Basophil #: 0 10*3/uL (ref 0.0–0.1)
EOS PCT: 1.8 %
Eosinophil #: 0.1 10*3/uL (ref 0.0–0.7)
HCT: 41.4 % (ref 35.0–47.0)
HGB: 13.6 g/dL (ref 12.0–16.0)
Lymphocyte #: 1.8 10*3/uL (ref 1.0–3.6)
Lymphocyte %: 24.5 %
MCH: 31.1 pg (ref 26.0–34.0)
MCHC: 33 g/dL (ref 32.0–36.0)
MCV: 94 fL (ref 80–100)
MONO ABS: 0.8 x10 3/mm (ref 0.2–0.9)
Monocyte %: 10.7 %
Neutrophil #: 4.7 10*3/uL (ref 1.4–6.5)
Neutrophil %: 62.4 %
Platelet: 180 10*3/uL (ref 150–440)
RBC: 4.38 10*6/uL (ref 3.80–5.20)
RDW: 13.3 % (ref 11.5–14.5)
WBC: 7.5 10*3/uL (ref 3.6–11.0)

## 2014-06-05 LAB — BASIC METABOLIC PANEL
Anion Gap: 9 (ref 7–16)
BUN: 22 mg/dL — AB
Calcium, Total: 9.2 mg/dL
Chloride: 105 mmol/L
Co2: 27 mmol/L
Creatinine: 0.97 mg/dL
EGFR (African American): >60
EGFR (Non-African Amer.): 58 — ABNORMAL LOW
GLUCOSE: 94 mg/dL
POTASSIUM: 3.5 mmol/L
Sodium: 141 mmol/L

## 2014-06-05 NOTE — Telephone Encounter (Signed)
Patient called to let us know that she was taking 325 mg aspirin and wanted to know when she should stop her medication due to surgery scheduled for 06/10/14. Per Dr Bary Castilla patient is to decrease her aspirin to 81mg  starting today until after her surgery. Patient expresses understanding.

## 2014-06-09 ENCOUNTER — Encounter: Payer: Self-pay | Admitting: General Surgery

## 2014-06-10 ENCOUNTER — Encounter: Payer: Self-pay | Admitting: General Surgery

## 2014-06-10 ENCOUNTER — Ambulatory Visit
Admit: 2014-06-10 | Disposition: A | Discharge status: Home / Self Care | Payer: Self-pay | Attending: General Surgery | Admitting: General Surgery

## 2014-06-10 DIAGNOSIS — C50411 Malignant neoplasm of upper-outer quadrant of right female breast: Secondary | ICD-10-CM

## 2014-06-10 HISTORY — DX: Malignant neoplasm of upper-outer quadrant of right female breast: C50.411

## 2014-06-10 HISTORY — PX: BREAST SURGERY: SHX581

## 2014-06-10 HISTORY — PX: BREAST LUMPECTOMY: SHX2

## 2014-06-12 ENCOUNTER — Encounter: Payer: Self-pay | Admitting: General Surgery

## 2014-06-12 ENCOUNTER — Telehealth: Payer: Self-pay | Admitting: *Deleted

## 2014-06-12 NOTE — Telephone Encounter (Signed)
Phone call with pathology, excision breast mass, DCIS as well as invasive carcinoma, 1 cm, margins negative.

## 2014-06-17 ENCOUNTER — Ambulatory Visit (INDEPENDENT_AMBULATORY_CARE_PROVIDER_SITE_OTHER): Payer: Medicare HMO | Admitting: General Surgery

## 2014-06-17 ENCOUNTER — Other Ambulatory Visit: Payer: Self-pay | Admitting: General Surgery

## 2014-06-17 ENCOUNTER — Encounter: Payer: Self-pay | Admitting: General Surgery

## 2014-06-17 VITALS — BP 128/72 | HR 74 | Resp 18 | Ht 63.0 in | Wt 295.0 lb

## 2014-06-17 DIAGNOSIS — D0511 Intraductal carcinoma in situ of right breast: Secondary | ICD-10-CM

## 2014-06-17 DIAGNOSIS — C50911 Malignant neoplasm of unspecified site of right female breast: Secondary | ICD-10-CM

## 2014-06-17 NOTE — Progress Notes (Signed)
Patient ID: Maria Gutierrez, female   DOB: [birth year], 74 y.o.   MRN: [id number]  Chief Complaint  Patient presents with  . Routine Post Op    right breast excision     HPI Maria Gutierrez is a 74 y.o. female here today for her op post right breast wide excision done on 06/10/14. Patient states she is doing well. HPI  Past Medical History  Diagnosis Date  . Hyperlipidemia   . Hypertension   . GERD (gastroesophageal reflux disease)   . Arthritis   . 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    Past Surgical History  Procedure Laterality Date  . Abdominal hysterectomy    . Kneee surgery Left   . Cholecystectomy  2012  . Colonoscopy  2009  . Breast biopsy Left 2015  . 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    No family history on file.  Social History History  Substance Use Topics  . Smoking status: Never Smoker   . Smokeless tobacco: Not on file  . Alcohol Use: No    No Known Allergies  Current Outpatient Prescriptions  Medication Sig Dispense Refill  . cholecalciferol (VITAMIN D) 1000 UNITS tablet Take 1,000 Units by mouth daily.    [name] doxycycline (VIBRA-TABS) 100 MG tablet     . hydrochlorothiazide (HYDRODIURIL) 25 MG tablet     . lansoprazole (PREVACID) 15 MG capsule Take 15 mg by mouth daily at 12 noon.    . meloxicam (MOBIC) 15 MG tablet     . metoprolol succinate (TOPROL-XL) 50 MG 24 hr tablet Take 50 mg by mouth daily. Take with or immediately following a meal.    . PARoxetine (PAXIL) 40 MG tablet Take 40 mg by mouth every morning.    . simvastatin (ZOCOR) 40 MG tablet Take 40 mg by mouth daily.    . traMADol (ULTRAM) 50 MG tablet Take by mouth every 6 (six) hours as needed.     No current facility-administered medications for this visit.    Review of Systems Review  of Systems  Constitutional: Negative.   Respiratory: Negative.   Cardiovascular: Negative.     Blood pressure 128/72, pulse 74, resp. rate 18, height _0  (1.6 m), weight 295 lb (133.811 kg).  Physical Exam Physical Exam  Constitutional: She is oriented to person, place, and time. She appears well-developed and well-nourished.  Eyes: Conjunctivae are normal. No scleral icterus.  Cardiovascular: Normal rate, regular rhythm and normal heart sounds.   Pulmonary/Chest: Effort normal and breath sounds normal.    Right breast incision is clean and healing well.   Neurological: She is alert and oriented to person, place, and time.  Skin: Skin is warm and dry.    Data Reviewed Final pathology showed a 10 mm area of invasive mammary carcinoma. All margins negative for both DCIS and invasive cancer.    Assessment    Stage I carcinoma the right breast.    Plan    Indication for sentinel node biopsy was reviewed with the patient. We will defer this for about 3 weeks to allow the inflammatory process in the breast to resolve. This will improve our success of identifying a sentinel node post wide excision. She was advised that it may not be possible to identify a sentinel node.  Patient is scheduled for surgery at [facility] on 07/14/14. She will pre admit by phone. Patient is aware of date and instructions.   PCP:  Virgie Dad 06/17/2014, 1:02 PM

## 2014-06-17 NOTE — Patient Instructions (Addendum)
Follow up appointment to be announced.  Patient is scheduled for surgery at [facility] on 07/14/14. She will pre admit by phone. Patient is aware of date and instructions.

## 2014-06-18 ENCOUNTER — Encounter: Payer: Self-pay | Admitting: General Surgery

## 2014-06-23 LAB — SURGICAL PATHOLOGY

## 2014-06-29 NOTE — Op Note (Signed)
PATIENT NAME:  Maria Gutierrez, Maria Gutierrez MR#:  [id number] DATE OF BIRTH:  1940-07-14  DATE OF OPERATION:  June 10, 2014    PREOPERATIVE DIAGNOSIS:  Ductal carcinoma in situ, right breast.   POSTOPERATIVE DIAGNOSIS:   Ductal carcinoma in situ, right breast.   OPERATIVE PROCEDURE:  Wide excision with wire localization.   OPERATING SURGEON:  Hervey Ard.   ANESTHESIA:  General endotracheal under Dr. Kayleen Memos, Marcaine 0.5% with 1:200,000 units of epinephrine 20 mL local infiltration.   ESTIMATED BLOOD LOSS:  Minimal.    CLINICAL NOTE:  This 74 year old woman had an area of microcalcifications and core biopsy showed evidence of intermediate grade DCIS.  She desired breast conservation.  Bracketing wires were placed by Pamelia Hoit, M.D., from radiology prior to the procedure.   OPERATIVE NOTE:  With the patient under adequate general endotracheal anesthesia and the breast taped to the left and inferiorly to provide better exposure of the wires at the 9 o'clock position, the area was prepped with ChloraPrep and draped.  Marcaine was infiltrated for postoperative analgesia.  A curvilinear incision from the 9 to 12 o'clock position was made between the two wires.  The skin and adipose tissue was then elevated and the wires identified just cephalad of the breast parenchyma.  A 6 x 6 x 5 block of breast tissue down to but not including the pectoralis fascia was removed, orientated, and sent for specimen radiograph.  This confirmed the wires were intact and the previously placed clip was present.  Gross examination showed the old biopsy cavity.   The breast parenchyma was approximated with multiple layers of 2-0 Vicryl figure-of-eight sutures.  The skin flap was elevated inferiorly to allow for a tension-free closure.  The skin was approximated with a running 4-0 Vicryl subcuticular suture.  Benzoin and Steri-Strips were applied.  Telfa pad, fluffed gauze, followed by the patient's bra for support.    The patient  tolerated the procedure well and was taken to recovery in stable condition.    ____________________________ Robert Bellow, MD jwb:kc D: 06/10/2014 12:08:57 ET T: 06/10/2014 12:34:11 ET JOB#: [id number]  cc: Robert Bellow, MD, <Dictator> Irven Easterly. Kary Kos, MD JEFFREY Amedeo Kinsman MD ELECTRONICALLY SIGNED 06/11/2014 8:56

## 2014-07-03 ENCOUNTER — Encounter: Payer: Self-pay | Admitting: *Deleted

## 2014-07-03 ENCOUNTER — Other Ambulatory Visit: Payer: Self-pay

## 2014-07-03 DIAGNOSIS — Z79899 Other long term (current) drug therapy: Secondary | ICD-10-CM | POA: Diagnosis not present

## 2014-07-03 DIAGNOSIS — Z9889 Other specified postprocedural states: Secondary | ICD-10-CM | POA: Diagnosis not present

## 2014-07-03 DIAGNOSIS — Z9049 Acquired absence of other specified parts of digestive tract: Secondary | ICD-10-CM | POA: Diagnosis not present

## 2014-07-03 DIAGNOSIS — K219 Gastro-esophageal reflux disease without esophagitis: Secondary | ICD-10-CM | POA: Diagnosis not present

## 2014-07-03 DIAGNOSIS — E785 Hyperlipidemia, unspecified: Secondary | ICD-10-CM | POA: Diagnosis not present

## 2014-07-03 DIAGNOSIS — C50411 Malignant neoplasm of upper-outer quadrant of right female breast: Secondary | ICD-10-CM | POA: Diagnosis present

## 2014-07-03 DIAGNOSIS — Z90710 Acquired absence of both cervix and uterus: Secondary | ICD-10-CM | POA: Diagnosis not present

## 2014-07-03 DIAGNOSIS — M1990 Unspecified osteoarthritis, unspecified site: Secondary | ICD-10-CM | POA: Diagnosis not present

## 2014-07-03 DIAGNOSIS — Z791 Long term (current) use of non-steroidal anti-inflammatories (NSAID): Secondary | ICD-10-CM | POA: Diagnosis not present

## 2014-07-03 DIAGNOSIS — I10 Essential (primary) hypertension: Secondary | ICD-10-CM | POA: Diagnosis not present

## 2014-07-11 ENCOUNTER — Other Ambulatory Visit: Payer: Self-pay | Admitting: General Surgery

## 2014-07-11 ENCOUNTER — Telehealth: Payer: Self-pay | Admitting: General Surgery

## 2014-07-11 DIAGNOSIS — C50911 Malignant neoplasm of unspecified site of right female breast: Secondary | ICD-10-CM

## 2014-07-11 NOTE — Telephone Encounter (Signed)
The patient was notified that she will presented 7:30 AM on Monday, May 16 for her planned node biopsy. Arrangements have been made for sentinel node injection at 8 AM that day.

## 2014-07-14 ENCOUNTER — Encounter
Admission: RE | Disposition: A | Discharge status: Home / Self Care | Payer: Self-pay | Source: Ambulatory Visit | Attending: General Surgery

## 2014-07-14 ENCOUNTER — Ambulatory Visit
Admission: RE | Admit: 2014-07-14 | Discharge: 2014-07-14 | Disposition: A | Discharge status: Home / Self Care | Payer: Medicare HMO | Source: Ambulatory Visit | Attending: General Surgery | Admitting: General Surgery

## 2014-07-14 ENCOUNTER — Ambulatory Visit: Payer: Medicare HMO | Admitting: Anesthesiology

## 2014-07-14 ENCOUNTER — Encounter: Admission: RE | Admit: 2014-07-14 | Payer: Medicare HMO | Source: Ambulatory Visit | Admitting: General Surgery

## 2014-07-14 ENCOUNTER — Encounter: Payer: Self-pay | Admitting: *Deleted

## 2014-07-14 DIAGNOSIS — E785 Hyperlipidemia, unspecified: Secondary | ICD-10-CM | POA: Insufficient documentation

## 2014-07-14 DIAGNOSIS — C50911 Malignant neoplasm of unspecified site of right female breast: Secondary | ICD-10-CM

## 2014-07-14 DIAGNOSIS — Z9049 Acquired absence of other specified parts of digestive tract: Secondary | ICD-10-CM | POA: Insufficient documentation

## 2014-07-14 DIAGNOSIS — C50511 Malignant neoplasm of lower-outer quadrant of right female breast: Secondary | ICD-10-CM | POA: Diagnosis not present

## 2014-07-14 DIAGNOSIS — C50919 Malignant neoplasm of unspecified site of unspecified female breast: Secondary | ICD-10-CM

## 2014-07-14 DIAGNOSIS — Z791 Long term (current) use of non-steroidal anti-inflammatories (NSAID): Secondary | ICD-10-CM | POA: Insufficient documentation

## 2014-07-14 DIAGNOSIS — C50411 Malignant neoplasm of upper-outer quadrant of right female breast: Secondary | ICD-10-CM | POA: Diagnosis not present

## 2014-07-14 DIAGNOSIS — M1990 Unspecified osteoarthritis, unspecified site: Secondary | ICD-10-CM | POA: Insufficient documentation

## 2014-07-14 DIAGNOSIS — Z90710 Acquired absence of both cervix and uterus: Secondary | ICD-10-CM | POA: Insufficient documentation

## 2014-07-14 DIAGNOSIS — I10 Essential (primary) hypertension: Secondary | ICD-10-CM | POA: Insufficient documentation

## 2014-07-14 DIAGNOSIS — K219 Gastro-esophageal reflux disease without esophagitis: Secondary | ICD-10-CM | POA: Insufficient documentation

## 2014-07-14 DIAGNOSIS — Z79899 Other long term (current) drug therapy: Secondary | ICD-10-CM | POA: Insufficient documentation

## 2014-07-14 DIAGNOSIS — Z9889 Other specified postprocedural states: Secondary | ICD-10-CM | POA: Insufficient documentation

## 2014-07-14 HISTORY — DX: Anxiety disorder, unspecified: F41.9

## 2014-07-14 HISTORY — PX: AXILLARY LYMPH NODE BIOPSY: SHX5737

## 2014-07-14 HISTORY — DX: Cardiac murmur, unspecified: R01.1

## 2014-07-14 SURGERY — AXILLARY LYMPH NODE BIOPSY
Anesthesia: General | Laterality: Right | Wound class: Clean

## 2014-07-14 MED ORDER — MIDAZOLAM HCL 5 MG/5ML IJ SOLN
INTRAMUSCULAR | Status: DC | PRN
  Administered 2014-07-14: 2 mg via INTRAVENOUS

## 2014-07-14 MED ORDER — TRAMADOL HCL 50 MG PO TABS
ORAL_TABLET | ORAL | Status: AC
  Filled 2014-07-14: qty 1

## 2014-07-14 MED ORDER — FENTANYL CITRATE (PF) 100 MCG/2ML IJ SOLN
INTRAMUSCULAR | Status: DC | PRN
  Administered 2014-07-14 (×2): 50 ug via INTRAVENOUS

## 2014-07-14 MED ORDER — TECHNETIUM TC 99M SULFUR COLLOID
1.0500 | Freq: Once | INTRAVENOUS | Status: AC | PRN
  Administered 2014-07-14: 1.05 via INTRAVENOUS

## 2014-07-14 MED ORDER — SODIUM CHLORIDE 0.9 % IJ SOLN
INTRAMUSCULAR | Status: AC
  Filled 2014-07-14: qty 10

## 2014-07-14 MED ORDER — GLYCOPYRROLATE 0.2 MG/ML IJ SOLN
INTRAMUSCULAR | Status: DC | PRN
  Administered 2014-07-14: .2 mg via INTRAVENOUS

## 2014-07-14 MED ORDER — EPHEDRINE SULFATE 50 MG/ML IJ SOLN
INTRAMUSCULAR | Status: DC | PRN
  Administered 2014-07-14: 5 mg via INTRAVENOUS
  Administered 2014-07-14 (×2): 10 mg via INTRAVENOUS

## 2014-07-14 MED ORDER — BUPIVACAINE HCL (PF) 0.5 % IJ SOLN
INTRAMUSCULAR | Status: AC
  Filled 2014-07-14: qty 30

## 2014-07-14 MED ORDER — FENTANYL CITRATE (PF) 100 MCG/2ML IJ SOLN
25.0000 ug | INTRAMUSCULAR | Status: DC | PRN
  Administered 2014-07-14 (×4): 25 ug via INTRAVENOUS

## 2014-07-14 MED ORDER — FENTANYL CITRATE (PF) 100 MCG/2ML IJ SOLN
INTRAMUSCULAR | Status: AC
  Administered 2014-07-14: 25 ug via INTRAVENOUS
  Filled 2014-07-14: qty 2

## 2014-07-14 MED ORDER — ONDANSETRON HCL 4 MG/2ML IJ SOLN: 4.0000 mg | Freq: Once | INTRAMUSCULAR | Status: DC | PRN

## 2014-07-14 MED ORDER — BUPIVACAINE HCL 0.5 % IJ SOLN
INTRAMUSCULAR | Status: DC | PRN
  Administered 2014-07-14: 30 mL

## 2014-07-14 MED ORDER — METHYLENE BLUE 1 % INJ SOLN
INTRAMUSCULAR | Status: AC
  Filled 2014-07-14: qty 10

## 2014-07-14 MED ORDER — TRAMADOL HCL 50 MG PO TABS
50.0000 mg | ORAL_TABLET | ORAL | Status: DC | PRN
  Administered 2014-07-14: 50 mg via ORAL

## 2014-07-14 MED ORDER — METHYLENE BLUE 1 % INJ SOLN
INTRAMUSCULAR | Status: DC | PRN
  Administered 2014-07-14: 4 mL via INTRADERMAL

## 2014-07-14 MED ORDER — LIDOCAINE HCL (CARDIAC) 20 MG/ML IV SOLN
INTRAVENOUS | Status: DC | PRN
  Administered 2014-07-14: 100 mg via INTRAVENOUS

## 2014-07-14 MED ORDER — ONDANSETRON HCL 4 MG/2ML IJ SOLN
INTRAMUSCULAR | Status: DC | PRN
  Administered 2014-07-14: 4 mg via INTRAVENOUS

## 2014-07-14 MED ORDER — LACTATED RINGERS IV SOLN
INTRAVENOUS | Status: DC
  Administered 2014-07-14 (×2): via INTRAVENOUS

## 2014-07-14 MED ORDER — PROPOFOL 10 MG/ML IV BOLUS
INTRAVENOUS | Status: DC | PRN
  Administered 2014-07-14: 180 mg via INTRAVENOUS

## 2014-07-14 SURGICAL SUPPLY — 46 items
APL SKNCLS STERI-STRIP NONHPOA (GAUZE/BANDAGES/DRESSINGS) ×1
APPLIER CLIP 11 MED OPEN (CLIP) ×3
APR CLP MED 11 20 MLT OPN (CLIP) ×1
BENZOIN TINCTURE PRP APPL 2/3 (GAUZE/BANDAGES/DRESSINGS) ×2 IMPLANT
BLADE SURG 15 STRL SS SAFETY (BLADE) ×3 IMPLANT
CANISTER SUCT 1200ML W/VALVE (MISCELLANEOUS) ×3 IMPLANT
CHLORAPREP W/TINT 26ML (MISCELLANEOUS) ×3 IMPLANT
CLIP APPLIE 11 MED OPEN (CLIP) ×1 IMPLANT
CLOSURE WOUND 1/2 X4 (GAUZE/BANDAGES/DRESSINGS) ×1
CNTNR SPEC 2.5X3XGRAD LEK (MISCELLANEOUS) ×1
CONT SPEC 4OZ STER OR WHT (MISCELLANEOUS) ×2
CONT SPEC 4OZ STRL OR WHT (MISCELLANEOUS) ×1
CONTAINER SPEC 2.5X3XGRAD LEK (MISCELLANEOUS) IMPLANT
DECANTER SPIKE VIAL GLASS SM (MISCELLANEOUS) ×2 IMPLANT
DRAPE LAPAROTOMY TRNSV 106X77 (MISCELLANEOUS) ×3 IMPLANT
DRESSING TELFA 4X3 1S ST N-ADH (GAUZE/BANDAGES/DRESSINGS) ×3 IMPLANT
DRSG TEGADERM 2X2.25 PEDS (GAUZE/BANDAGES/DRESSINGS) ×2 IMPLANT
DRSG TEGADERM 4X4.75 (GAUZE/BANDAGES/DRESSINGS) ×3 IMPLANT
DRSG TELFA 3X8 NADH (GAUZE/BANDAGES/DRESSINGS) ×3 IMPLANT
GLOVE BIO SURGEON STRL SZ7.5 (GLOVE) ×3 IMPLANT
GLOVE INDICATOR 8.0 STRL GRN (GLOVE) ×3 IMPLANT
GOWN STRL REUS W/ TWL LRG LVL3 (GOWN DISPOSABLE) ×2 IMPLANT
GOWN STRL REUS W/TWL LRG LVL3 (GOWN DISPOSABLE) ×6
KIT RM TURNOVER STRD PROC AR (KITS) ×3 IMPLANT
LABEL OR SOLS (LABEL) ×1 IMPLANT
MARGIN MAP 10MM (MISCELLANEOUS) ×3 IMPLANT
NDL HYPO 25X1 1.5 SAFETY (NEEDLE) IMPLANT
NDL SAFETY 25GX1.5 (NEEDLE) ×3 IMPLANT
NDL SAFETY ECLIPSE 18X1.5 (NEEDLE) IMPLANT
NEEDLE HYPO 18GX1.5 SHARP (NEEDLE) ×3
NEEDLE HYPO 25X1 1.5 SAFETY (NEEDLE) ×3 IMPLANT
NS IRRIG 500ML POUR BTL (IV SOLUTION) ×3 IMPLANT
PACK BASIN MINOR ARMC (MISCELLANEOUS) ×3 IMPLANT
PAD DRESSING TELFA 3X8 NADH (GAUZE/BANDAGES/DRESSINGS) IMPLANT
PAD GROUND ADULT SPLIT (MISCELLANEOUS) ×3 IMPLANT
SHEARS FOC LG CVD HARMONIC 17C (MISCELLANEOUS) ×3 IMPLANT
SLEVE PROBE SENORX GAMMA FIND (MISCELLANEOUS) ×2 IMPLANT
STRIP CLOSURE SKIN 1/2X4 (GAUZE/BANDAGES/DRESSINGS) ×2 IMPLANT
SUT VIC AB 2-0 CT1 27 (SUTURE) ×3
SUT VIC AB 2-0 CT1 TAPERPNT 27 (SUTURE) ×1 IMPLANT
SUT VIC AB 3-0 54X BRD REEL (SUTURE) ×1 IMPLANT
SUT VIC AB 3-0 BRD 54 (SUTURE) ×3
SUT VIC AB 4-0 PS2 18 (SUTURE) ×3 IMPLANT
SWABSTK COMLB BENZOIN TINCTURE (MISCELLANEOUS) ×3 IMPLANT
SYR CONTROL 10ML (SYRINGE) ×3 IMPLANT
SYRINGE 10CC LL (SYRINGE) ×2 IMPLANT

## 2014-07-14 NOTE — H&P (Signed)
No change in health status. Reviewed SLN biopsy procedure w/ patient. Site marked.  Incision is in the right lower outer quadrant, not upper outer quadrant as noted on last office note. For right SLN biopsy today.

## 2014-07-14 NOTE — Discharge Instructions (Signed)

## 2014-07-14 NOTE — Anesthesia Postprocedure Evaluation (Signed)
  Anesthesia Post-op Note  Patient: Maria Gutierrez  Procedure(s) Performed: Procedure(s): AXILLARY LYMPH NODE BIOPSY (Right)  Anesthesia type:General LMA  Patient location: PACU  Post pain: Pain level controlled  Post assessment: Post-op Vital signs reviewed, Patient's Cardiovascular Status Stable, Respiratory Function Stable, Patent Airway and No signs of Nausea or vomiting  Post vital signs: Reviewed and stable  Last Vitals:  Filed Vitals:   07/14/14 1201  BP: 119/79  Pulse: 77  Temp:   Resp: 16    Level of consciousness: awake, alert  and patient cooperative  Complications: No apparent anesthesia complications

## 2014-07-14 NOTE — Anesthesia Preprocedure Evaluation (Addendum)
Anesthesia Evaluation  Patient identified by MRN, date of birth, ID band Patient awake    Reviewed: Allergy & Precautions, H&P , NPO status , Patient's Chart, lab work & pertinent test results, reviewed documented beta blocker date and time   History of Anesthesia Complications (+) PONV  Airway Mallampati: III  TM Distance: >3 FB Neck ROM: full    Dental   Pulmonary Current Smoker,          Cardiovascular hypertension, + Valvular Problems/Murmurs Rate:Normal     Neuro/Psych    GI/Hepatic GERD-  ,  Endo/Other    Renal/GU      Musculoskeletal   Abdominal   Peds  Hematology   Anesthesia Other Findings   Reproductive/Obstetrics                            Anesthesia Physical Anesthesia Plan  ASA: III  Anesthesia Plan: General LMA   Post-op Pain Management:    Induction:   Airway Management Planned:   Additional Equipment:   Intra-op Plan:   Post-operative Plan:   Informed Consent: I have reviewed the patients History and Physical, chart, labs and discussed the procedure including the risks, benefits and alternatives for the proposed anesthesia with the patient or authorized representative who has indicated his/her understanding and acceptance.     Plan Discussed with: CRNA  Anesthesia Plan Comments:         Anesthesia Quick Evaluation

## 2014-07-14 NOTE — Op Note (Signed)
Preoperative diagnosis: Right breast cancer.  Postoperative diagnosis: Same.  Operative procedure: Right axillary node biopsy.  Operating surgeon Ollen Bowl, M.D.  Anesthesia: Gen. by LMA, Marcaine 0.5%, plain: 30 mL.  Estimated blood loss: Less than 5 mL.  Clinical note: This 74 year old woman had a core biopsy showing evidence of DCIS. Wide excision was completed showing evidence of invasive cancer. Sentinel node biopsy was planned to completely stage the patient. She was injected with technetium sulfur colloid prior to procedure. After induction of anesthesia 4 mL of normal saline/methylene blue mixed 21 was injected in the subareolar plexus.  Operative note: The patient underwent general anesthesia without difficulty. The breast axilla and neck was prepped with ChloraPrep and draped.  The gamma finder was used to identify the area of increased uptake. Marcaine was infiltrated for post operative analgesia. A skin line incision was made and carried down through the skin and 7 cm of adipose tissue. The axillary envelope was opened. 2 hot blue nodes were identified. These were sent and reported as negative for macro metastatic disease by pathology. The wound was closed in layers with 2-0 Vicryl figure-of-eight sutures. The skin was closed with a running 4-0 Vicryl septic suture. Benzoin Steri-Strips Telfa and Tegaderm dressing was applied.  Patient tolerance procedure well and taken recovery in stable condition.

## 2014-07-14 NOTE — Transfer of Care (Signed)
Immediate Anesthesia Transfer of Care Note  Patient: Maria Gutierrez  Procedure(s) Performed: Procedure(s): AXILLARY LYMPH NODE BIOPSY (Right)  Patient Location: PACU  Anesthesia Type:General  Level of Consciousness: awake and alert   Airway & Oxygen Therapy: Patient Spontanous Breathing and Patient connected to face mask oxygen  Post-op Assessment: Report given to RN and Post -op Vital signs reviewed and stable  Post vital signs: stable  Last Vitals:  Filed Vitals:   07/14/14 0743  BP: 146/89  Pulse: 71  Temp: 36.9 C  Resp: 14    Complications: No apparent anesthesia complications

## 2014-07-14 NOTE — Transfer of Care (Incomplete)
Immediate Anesthesia Transfer of Care Note  Patient: Maria Gutierrez  Procedure(s) Performed: Procedure(s): AXILLARY LYMPH NODE BIOPSY (Right)  Patient Location: {PLACES; ANE POST:19477::"PACU"}  Anesthesia Type:{PROCEDURES; ANE POST ANESTHESIA TYPE:19480}  Level of Consciousness: {FINDINGS; ANE POST LEVEL OF CONSCIOUSNESS:19484}  Airway & Oxygen Therapy: {Exam; oxygen device:30095}  Post-op Assessment: {ASSESSMENT;POST-OP ZOXWRU:04540}  Post vital signs: {DESC; ANE POST JWJXBJ:47829}  Last Vitals:  Filed Vitals:   07/14/14 1004  BP: 142/98  Pulse: 89  Temp: 36.5 C  Resp: 18    Complications: {FINDINGS; ANE POST COMPLICATIONS:19485}

## 2014-07-15 ENCOUNTER — Encounter: Payer: Self-pay | Admitting: General Surgery

## 2014-07-15 LAB — SURGICAL PATHOLOGY

## 2014-07-16 ENCOUNTER — Telehealth: Payer: Self-pay | Admitting: *Deleted

## 2014-07-16 MED ORDER — TRAMADOL HCL 50 MG PO TABS: ORAL_TABLET | ORAL | Status: DC

## 2014-07-16 NOTE — Addendum Note (Signed)
Addended by: Carson Myrtle on: 07/16/2014 10:52 AM   Modules accepted: Orders, Medications

## 2014-07-16 NOTE — Telephone Encounter (Signed)
Patient had surgery on 07/14/14 and she wants to see if she can get some more Tramadol called in. She said that she is about to run out and she doesn't want to go through the weekend without any. She uses [facility] on [address].

## 2014-07-16 NOTE — Telephone Encounter (Signed)
OK to call in Tramadol, 50 mg, #30, 1-2 po q4-6h prn pain. No refills.

## 2014-07-21 ENCOUNTER — Encounter: Payer: Self-pay | Admitting: General Surgery

## 2014-07-21 ENCOUNTER — Other Ambulatory Visit: Payer: Medicare HMO

## 2014-07-21 ENCOUNTER — Ambulatory Visit (INDEPENDENT_AMBULATORY_CARE_PROVIDER_SITE_OTHER): Payer: Medicare HMO | Admitting: General Surgery

## 2014-07-21 VITALS — BP 130/84 | HR 76 | Resp 13 | Ht 63.0 in | Wt 298.0 lb

## 2014-07-21 DIAGNOSIS — D0511 Intraductal carcinoma in situ of right breast: Secondary | ICD-10-CM | POA: Diagnosis not present

## 2014-07-21 NOTE — Patient Instructions (Signed)
Patient to see Dr. Noreene Filbert at the [facility].

## 2014-07-21 NOTE — Progress Notes (Signed)
Patient ID: Maria Gutierrez, female   DOB: 1941-02-11, 75 y.o.   MRN: [id number]  Chief Complaint  Patient presents with  . Routine Post Op    right axillary node biopsy    HPI Maria Gutierrez is a 74 y.o. female here for a post op visit from right axillary node biopsy done on 07/14/14. This was completed as after wide excision for DCIS she was found to have a microscopic foci of invasive cancer.   She states that she is doing well. She does say that the biopsy area is still sore.  HPI  Past Medical History  Diagnosis Date  . Hyperlipidemia   . Hypertension   . GERD (gastroesophageal reflux disease)   . Arthritis   . Heart murmur   . Anxiety   . 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    Past Surgical History  Procedure Laterality Date  . Abdominal hysterectomy    . Kneee surgery Left   . Cholecystectomy  2012  . Colonoscopy  2009  . Breast biopsy Left 2015  . 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  . Eye surgery    . Axillary lymph node biopsy Right 07/14/2014    Procedure: AXILLARY LYMPH NODE BIOPSY;  Surgeon: Robert Bellow, MD;  Location: [facility] ORS;  Service: General;  Laterality: Right;    No family history on file.  Social History History  Substance Use Topics  . Smoking status: Never Smoker   . Smokeless tobacco: Never Used  . Alcohol Use: No    No Known Allergies  Current Outpatient Prescriptions  Medication Sig Dispense Refill  . aspirin 81 MG tablet Take 81 mg by mouth daily.    . calcium carbonate (OS-CAL) 600 MG TABS tablet Take 600 mg by mouth daily.    . cholecalciferol (VITAMIN D) 1000 UNITS tablet Take 1,000 Units by mouth daily.    [name] doxycycline (VIBRA-TABS) 100 MG tablet     . hydrochlorothiazide (HYDRODIURIL) 25 MG tablet     . lansoprazole (PREVACID)  15 MG capsule Take 15 mg by mouth every morning.     . meloxicam (MOBIC) 15 MG tablet     . metoprolol succinate (TOPROL-XL) 50 MG 24 hr tablet Take 50 mg by mouth every morning. Take with or immediately following a meal.    . PARoxetine (PAXIL) 40 MG tablet Take 40 mg by mouth at bedtime.     . simvastatin (ZOCOR) 40 MG tablet Take 40 mg by mouth at bedtime.     . traMADol (ULTRAM) 50 MG tablet Take by mouth every 6 (six) hours as needed.     No current facility-administered medications for this visit.    Review of Systems Review of Systems  Constitutional: Negative.   Respiratory: Negative.   Cardiovascular: Negative.     Blood pressure 130/84, pulse 76, resp. rate 13, height _0  (1.6 m), weight 298 lb (135.172 kg).  Physical Exam Physical Exam  Constitutional: She is oriented to person, place, and time. She appears well-developed and well-nourished.  Pulmonary/Chest:    Neurological: She is alert and oriented to person, place, and time.  Skin: Skin is warm and dry.    Data Reviewed Examination of the Wide Excision Site Showed a Small Seroma Cavity Measuring 0.8 x 1.8 x  3.9 Cm. This Is approximately 1. centimeters below the skin surface.   Assessment    Doing well status post wide excision and subsequent sentinel node biopsy.    Plan    the patient is a candidate for either whole breast or partial breast radiation. The family has a week at the beach planned mid-June. We'll arrange for early assessment by radiation oncology for their opinion regarding appropriate RT therapy.  Patient to see Dr. Noreene Filbert at the [facility].   The reflex testing to fish was positive for HER-2/neu. The patient is T1b, N0. Formal opinion from medical oncology regarding the advisability of adjuvant therapy will be obtained.    PCP: Virgie Dad 07/22/2014, 12:48 PM

## 2014-07-22 ENCOUNTER — Encounter: Payer: Self-pay | Admitting: General Surgery

## 2014-07-22 ENCOUNTER — Telehealth: Payer: Self-pay

## 2014-07-22 ENCOUNTER — Other Ambulatory Visit: Payer: Self-pay

## 2014-07-22 DIAGNOSIS — D0511 Intraductal carcinoma in situ of right breast: Secondary | ICD-10-CM

## 2014-07-22 NOTE — Telephone Encounter (Signed)
Spoke with patient and notified her of her appointment with Dr Noreene Filbert at [facility] for 07/23/14 at 2:00 pm. Patient is aware of appointment date and time.

## 2014-07-23 ENCOUNTER — Ambulatory Visit
Admission: RE | Admit: 2014-07-23 | Discharge: 2014-07-23 | Disposition: A | Discharge status: Home / Self Care | Payer: Medicare HMO | Source: Ambulatory Visit | Attending: Radiation Oncology | Admitting: Radiation Oncology

## 2014-07-23 ENCOUNTER — Inpatient Hospital Stay: Payer: Medicare HMO | Attending: Oncology | Admitting: Oncology

## 2014-07-23 ENCOUNTER — Encounter: Payer: Self-pay | Admitting: Radiation Oncology

## 2014-07-23 VITALS — BP 133/86 | HR 70 | Temp 96.1°F | Wt 300.9 lb

## 2014-07-23 DIAGNOSIS — C50411 Malignant neoplasm of upper-outer quadrant of right female breast: Secondary | ICD-10-CM

## 2014-07-23 DIAGNOSIS — Z79899 Other long term (current) drug therapy: Secondary | ICD-10-CM | POA: Diagnosis not present

## 2014-07-23 DIAGNOSIS — Z170 Estrogen receptor positive status [ER+]: Secondary | ICD-10-CM | POA: Diagnosis not present

## 2014-07-23 DIAGNOSIS — I10 Essential (primary) hypertension: Secondary | ICD-10-CM | POA: Insufficient documentation

## 2014-07-23 DIAGNOSIS — C50911 Malignant neoplasm of unspecified site of right female breast: Secondary | ICD-10-CM | POA: Insufficient documentation

## 2014-07-23 DIAGNOSIS — D0511 Intraductal carcinoma in situ of right breast: Secondary | ICD-10-CM | POA: Insufficient documentation

## 2014-07-23 NOTE — Progress Notes (Signed)
Never smoked. Pt does not have living will.

## 2014-07-23 NOTE — Consult Note (Signed)
Radiation Oncology NEW PATIENT EVALUATION  Name: Maria Gutierrez  MRN: [id number]  Date:   07/23/2014     DOB: 1940/10/04   This 74 y.o. 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  REFERRING PHYSICIAN: [name], MD  CHIEF COMPLAINT: No chief complaint on file.   DIAGNOSIS: There were no encounter diagnoses.   PREVIOUS INVESTIGATIONS:  Mammograms and ultrasound reviewed Surgical pathology reports reviewed Clinical notes reviewed   HPI: Patient is a pleasant 74 year old female who presented with an abnormal mammogram on 05/22/2014 showing indeterminate right breast calcifications in the upper outer quadrant of the right breast for which stereotactic guided biopsy was recommended. Biopsy-positive for ductal carcinoma in situ. She went on to have a wide local excision for a 1 cm grade 2 overall invasive mammary carcinoma. Tumor was ER/PR positive and HER-2/neu overexpressed. Lymph vascular invasion was not identified. Lesion was a T1 B lesion. 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 Based on the fact there was invasive component she went on to have a sentinel node biopsy which was negative for metastatic disease. Patient has done well postoperatively. She's been seen by medical oncology and based on that HER-2 nu overexpression is favoring Herceptin plus adjuvant chemotherapy. She is seen today for consideration of treatment. Patient has morbid obesity also history of hypertension and hyperlipidemia.  PLANNED TREATMENT REGIMEN: Accelerated partial breast irradiation  PAST MEDICAL HISTORY:  has a past medical history of Hyperlipidemia; Hypertension; GERD (gastroesophageal reflux disease); Arthritis; Heart murmur; Anxiety; and Breast cancer of upper-outer quadrant of right female breast  (06/10/2014).    PAST SURGICAL HISTORY:  Past Surgical History  Procedure Laterality Date  . Abdominal hysterectomy    . Kneee surgery Left   . Cholecystectomy  2012  . Colonoscopy  2009  . Breast biopsy Left 2015  . 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  . Eye surgery    . Axillary lymph node biopsy Right 07/14/2014    Procedure: AXILLARY LYMPH NODE BIOPSY;  Surgeon: Robert Bellow, MD;  Location: [facility] ORS;  Service: General;  Laterality: Right;    FAMILY HISTORY: family history is not on file.  SOCIAL HISTORY:  reports that she has never smoked. She has never used smokeless tobacco. She reports that she does not drink alcohol or use illicit drugs.  ALLERGIES: Review of patient's allergies indicates no known allergies.  MEDICATIONS:  Current Outpatient Prescriptions  Medication Sig Dispense Refill  . aspirin 81 MG tablet Take 81 mg by mouth daily.    . calcium carbonate (OS-CAL) 600 MG TABS tablet Take 600 mg by mouth daily.    . cholecalciferol (VITAMIN D) 1000 UNITS tablet Take 1,000 Units by mouth daily.    [name] doxycycline (VIBRA-TABS) 100 MG tablet     . hydrochlorothiazide (HYDRODIURIL) 25 MG tablet     . lansoprazole (PREVACID) 15 MG capsule Take 15 mg by mouth every morning.     . meloxicam (MOBIC) 15 MG tablet     . metoprolol succinate (TOPROL-XL) 50 MG 24 hr tablet Take 50 mg by mouth every morning. Take with or immediately following a meal.    . PARoxetine (PAXIL) 40 MG tablet Take 40 mg by mouth at bedtime.     [name]  simvastatin (ZOCOR) 40 MG tablet Take 40 mg by mouth at bedtime.     . traMADol (ULTRAM) 50 MG tablet Take by mouth every 6 (six) hours as needed.     No current facility-administered medications for this encounter.    ECOG PERFORMANCE STATUS:  0 - Asymptomatic  REVIEW OF SYSTEMS:  Patient denies any weight loss, fatigue, weakness, fever, chills  or night sweats. Patient denies any loss of vision, blurred vision. Patient denies any ringing  of the ears or hearing loss. No irregular heartbeat. Patient denies heart murmur or history of fainting. Patient denies any chest pain or pain radiating to her upper extremities. Patient denies any shortness of breath, difficulty breathing at night, cough or hemoptysis. Patient denies any swelling in the lower legs. Patient denies any nausea vomiting, vomiting of blood, or coffee ground material in the vomitus. Patient denies any stomach pain. Patient states has had normal bowel movements no significant constipation or diarrhea. Patient denies any dysuria, hematuria or significant nocturia. Patient denies any problems walking, swelling in the joints or loss of balance. Patient denies any skin changes, loss of hair or loss of weight. Patient denies any excessive worrying or anxiety or significant depression. Patient denies any problems with insomnia. Patient denies excessive thirst, polyuria, polydipsia. Patient denies any swollen glands, patient denies easy bruising or easy bleeding. Patient denies any recent infections, allergies or URI. Patient "s visual fields have not changed significantly in recent time.    PHYSICAL EXAM: Well-developed obese female in NAD. She status was wide local excision of the right breast with incision healed well. No dominant mass or nodularity are noted in either breast in 2 positions examined. Patient's breasts are extremely large. Well-developed well-nourished patient in NAD. HEENT reveals PERLA, EOMI, discs not visualized.  Oral cavity is clear. No oral mucosal lesions are identified. Neck is clear without evidence of cervical or supraclavicular adenopathy. Lungs are clear to A&P. Cardiac examination is essentially unremarkable with regular rate and rhythm without murmur rub or thrill. Abdomen is benign with no organomegaly or masses noted. Motor sensory and DTR levels are equal and  symmetric in the upper and lower extremities. Cranial nerves II through XII are grossly intact. Proprioception is intact. No peripheral adenopathy or edema is identified. No motor or sensory levels are noted. Crude visual fields are within normal range.  There were no vitals taken for this visit.  LABORATORY DATA:  No results found for this or any previous visit (from the past 72 hour(s)).   RADIOLOGY RESULTS: US Breast Complete Uni Right Inc Axilla  07/22/2014   Examination of the Wide Excision Site Showed a Small Seroma Cavity  Measuring 0.8 x 1.8 x 3.9 Cm. This Is approximately 1. centimeters below  the skin surface.    IMPRESSION: Stage IA (T1 BN 0 M0) invasive mammary carcinoma the right breast triple-positive status post wide local excision and sentinel node biopsy in obese 74 year old female  PLAN: Based on the small nature of her tumor extremely large breasted female believe she would be neck a candidate for placement of MammoSite balloon and high dose rate remote afterloading with iridium 192 for accelerated partial breast radiation. Would plan on delivering 3400 cGy in 10 fractions at 340 C twice a day using high dose rate remote afterloading. I've also discussed the risks and benefits of whole breast radiation in this patient. Side effects of accelerated partial breast irradiation including postage stamp area of skin reaction, permanent thickening of the lumpectomy cavity,  fatigue, all were explained in detail to the patient. We will try to arrange with surgeon's office for placement of the MammoSite catheter then BrachyVision treatment planning after catheter placement. If we are unable to have adequate skin distance a she would need whole breast radiation. Sequencing of the accelerated partial breast radiation prior to chemotherapy would be advantageous.  I would like to take this opportunity for allowing me to participate in the care of your patient.Armstead Peaks.,  MD

## 2014-07-24 ENCOUNTER — Telehealth: Payer: Self-pay | Admitting: *Deleted

## 2014-07-24 MED ORDER — CEFADROXIL 500 MG PO CAPS: 500.0000 mg | ORAL_CAPSULE | Freq: Two times a day (BID) | ORAL | Status: DC

## 2014-07-24 NOTE — Telephone Encounter (Signed)
Mammosite schedule reviewed with the patient Placement 07-29-14 at 8:15  at [facility] Scan 07-31-14 Treat 3, 6,7,8,9 Aware Abigail Butts will be calling her for more details Aware of ATB and directions reviewed. Aware no showers and to wear her bra while mammosite in place. Pt agrees.

## 2014-07-28 ENCOUNTER — Encounter: Payer: Self-pay | Admitting: Oncology

## 2014-07-28 DIAGNOSIS — J302 Other seasonal allergic rhinitis: Secondary | ICD-10-CM | POA: Insufficient documentation

## 2014-07-28 DIAGNOSIS — E785 Hyperlipidemia, unspecified: Secondary | ICD-10-CM | POA: Insufficient documentation

## 2014-07-28 DIAGNOSIS — I10 Essential (primary) hypertension: Secondary | ICD-10-CM | POA: Insufficient documentation

## 2014-07-28 NOTE — Progress Notes (Signed)
Maria Gutierrez @ [facility] [phone]  Fax:([phone]) [place]: 1940-10-05  MR#: [id number]  VZS#:[id number]  Patient Care Team: [name], MD as PCP - General (Family Medicine) [name], MD as Referring Physician (Family Medicine) Robert Bellow, MD (General Surgery)  CHIEF COMPLAINT:  Chief Complaint  Patient presents with  . New Evaluation    VISIT DIAGNOSIS:     ICD-9-CM ICD-10-CM   1. Invasive ductal carcinoma of breast, stage 1, right 174.9 C50.911 NM Cardiac [place]     Oncology History   1.  Carcinoma of right breast.  Status post lumpectomy and sentinel lymph node evaluation.  Tumor size 1 cm.  Estrogen and progesterone receptor positive.  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  Diagnosis in April of 2016 2.  Accelerated partial breast radiation has been planned starting June of 2016     Invasive ductal carcinoma of breast, stage 1   06/17/2014 Initial Diagnosis Invasive ductal carcinoma of breast, stage 1    Oncology Flowsheet 07/14/2014  ondansetron (ZOFRAN) IJ -    INTERVAL HISTORY:  HPI: Patient is a pleasant 74 year old female who presented with an abnormal mammogram on 05/22/2014 showing indeterminate right breast calcifications in the upper outer quadrant of the right breast for which stereotactic guided biopsy was recommended. Biopsy-positive for ductal carcinoma in situ. She went on to have a wide local excision for a 1 cm grade 2 overall invasive mammary carcinoma. Tumor was ER/PR positive and HER-2/neu overexpressed. Lymph vascular invasion was not identified. Lesion was a T1 B lesion. 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 Based on the fact there was invasive component she went on to have a sentinel node biopsy which was negative for metastatic disease. Patient has done well postoperatively. She's been seen by medical oncology and based on that HER-2 nu overexpression is favoring Herceptin plus  adjuvant chemotherapy. She is seen today for consideration of treatment. Patient has morbid obesity also history of hypertension and hyperlipidemia.  REVIEW OF SYSTEMS:    GENERAL:  Feels good.  Active.  No fevers, sweats or weight loss. PERFORMANCE STATUS (ECOG):  0 HEENT:  No visual changes, runny nose, sore throat, mouth sores or tenderness. Lungs: No shortness of breath or cough.  No hemoptysis. Cardiac:  No chest pain, palpitations, orthopnea, or PND. GI:  No nausea, vomiting, diarrhea, constipation, melena or hematochezia. GU:  No urgency, frequency, dysuria, or hematuria. Musculoskeletal:  No back pain.  No joint pain.  No muscle tenderness. Extremities:  No pain or swelling. Skin:  No rashes or skin changes. Neuro:  No headache, numbness or weakness, balance or coordination issues. Endocrine:  No diabetes, thyroid issues, hot flashes or night sweats. Psych:  No mood changes, depression or anxiety. Pain:  No focal pain. Review of systems:  All other systems reviewed and found to be negative. As per HPI. Otherwise, a complete review of systems is negatve.  PAST MEDICAL HISTORY: Past Medical History  Diagnosis Date  . Hyperlipidemia   . Hypertension   . GERD (gastroesophageal reflux disease)   . Arthritis   . Heart murmur   . Anxiety   . 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    PAST SURGICAL HISTORY: Past Surgical History  Procedure Laterality Date  . Abdominal hysterectomy    . Kneee surgery Left   . Cholecystectomy  2012  . Colonoscopy  2009  . Breast biopsy Left 2015  . 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  . Eye surgery    . Axillary lymph node biopsy Right 07/14/2014    Procedure: AXILLARY LYMPH NODE BIOPSY;  Surgeon: Robert Bellow, MD;  Location: [facility] ORS;   Service: General;  Laterality: Right;    FAMILY HISTORY There is no significant family history of breast cancer, ovarian cancer, colon cancer       ADVANCED DIRECTIVES: Patient does not have any advanced healthcare directive. Information has been given.   HEALTH MAINTENANCE: History  Substance Use Topics  . Smoking status: Never Smoker   . Smokeless tobacco: Never Used  . Alcohol Use: No      No Known Allergies  Current Outpatient Prescriptions  Medication Sig Dispense Refill  . aspirin 81 MG tablet Take 81 mg by mouth daily.    . calcium carbonate (OS-CAL) 600 MG TABS tablet Take 600 mg by mouth daily.    . cholecalciferol (VITAMIN D) 1000 UNITS tablet Take 1,000 Units by mouth daily.    [name] doxycycline (VIBRA-TABS) 100 MG tablet     . hydrochlorothiazide (HYDRODIURIL) 25 MG tablet     . lansoprazole (PREVACID) 15 MG capsule Take 15 mg by mouth every morning.     . meloxicam (MOBIC) 15 MG tablet     . metoprolol succinate (TOPROL-XL) 50 MG 24 hr tablet Take 50 mg by mouth every morning. Take with or immediately following a meal.    . PARoxetine (PAXIL) 40 MG tablet Take 40 mg by mouth at bedtime.     . simvastatin (ZOCOR) 40 MG tablet Take 40 mg by mouth at bedtime.     . traMADol (ULTRAM) 50 MG tablet Take by mouth every 6 (six) hours as needed.    . cefadroxil (DURICEF) 500 MG capsule Take 1 capsule (500 mg total) by mouth 2 (two) times daily. Start one hour before office procedure on 07-29-14 20 capsule 0   No current facility-administered medications for this visit.    OBJECTIVE: PHYSICAL EXAM:   Filed Vitals:   07/23/14 1406  BP: 133/86  Pulse: 70  Temp: 96.1 F (35.6 C)     Body mass index is 53.32 kg/(m^2).    ECOG FS:0 - Asymptomatic  LAB RESULTS:  No visits with results within 3 Day(s) from this visit. Latest known visit with results is:  Admission on 07/14/2014, Discharged on 07/14/2014  Component Date Value Ref Range Status  . SURGICAL PATHOLOGY  07/14/2014    Final                   Value:Surgical Pathology CASE: [id number] PATIENT: Makaleigh Clinkscales Surgical Pathology Report     SPECIMEN SUBMITTED: A. Lymph node, sentinel #1  CLINICAL HISTORY: None provided  PRE-OPERATIVE DIAGNOSIS: DCIS Right Breast, Invasive ductal carcinoma  POST-OPERATIVE DIAGNOSIS: None provided     DIAGNOSIS: A. SENTINEL LYMPH NODE #1; EXCISION: - NO TUMOR SEEN IN TWO SENTINEL LYMPH NODES (0/2).   GROSS DESCRIPTION:  A. Intra Operative Consultation:     Received:  fresh     Specimen: sentinel node #1     Pathologic Evaluation: touch prep and frozen section     Diagnosis: A1-A2-Sentinel lymph node #1 excision; no evidence of macro metastasis into 2 Sentinel lymph nodes     Communicated to: Dr. Bary Castilla at 9:50 AM on 07/14/2014, Hinton Dyer  Baker M.D.     Tissue submitted: 1-2  Labeled: sentinel node #1 Tissue Fragment(s): 1 Measurement: 2.0 x 0.8 x 0.8 cm Comment: dissected to reveal 2 lymph node candidates, the first 0.5 x 0.4 x 0.3 and the second blue tinged, 1.4                          x 1.4 x 0.6 cm, both lymph nodes bisected, the smallest touch prep and the largest frozen, following touch prep and frozen section both formalin fixed  Entirely submitted in cassette(s): 1-smallest lymph node candidate 2-largest lymph node candidate remnant     Final Diagnosis performed by Delorse Lek, MD.  Electronically signed 07/15/2014 2:40:07PM    The electronic signature indicates that the named Attending Pathologist has evaluated the specimen  Technical component performed at [facility], [address] [place], [place] [address] Lab: [phone] Dir: Darrick Penna. Evette Doffing, MD  Professional component performed at [facility], [facility], [place], [place], [place] [address] Lab: [phone] Dir: Dellia Nims. Rubinas, MD       STUDIES: Nm Sentinel Node Inj-no Rpt (breast)  07/14/2014   CLINICAL DATA:    Sulfur colloid  was injected intradermally by the nuclear medicine  technologist for breast cancer sentinel node localization.    US Breast Complete Uni Right Inc Axilla  07/22/2014   Examination of the Wide Excision Site Showed a Small Seroma Cavity  Measuring 0.8 x 1.8 x 3.9 Cm. This Is approximately 1. centimeters below  the skin surface.    ASSESSMENT:  74 year old lady with a history of carcinoma of breast stage IB Sentinel lymph nodes negative Estrogen receptor progesterone receptor positive HER-2/neu receptor and overexpressed   PLAN:   Re: Local therapy patient has opted for partial breast radiation treatment Regarding systemic therapy: Patient would need anti-hormonal therapy for 5 years Possibility of chemotherapy with Herceptin.  According to incision guidelines more than 1 cm tumor requires chemotherapy in Herceptin therapy Patient is at borderline for 1 cm situation Considering patient does not have any significant medical problem possibility of Taxol weekly 12 and Herceptin therapy can be recommended I discussed briefly the situation and provided information regarding NCCNguidelines  Patient expressed understanding and was in agreement with this plan. She also understands that She can call clinic at any time with any questions, concerns, or complaints.    Invasive ductal carcinoma of breast, stage 1   Staging form: Breast, AJCC 7th Edition     Clinical: Stage IA (T1b, N0, M0) - Signed by Forest Gleason, MD on 07/28/2014   Forest Gleason, MD   07/28/2014 4:22 PM

## 2014-07-29 ENCOUNTER — Ambulatory Visit: Payer: Medicare HMO

## 2014-07-29 ENCOUNTER — Ambulatory Visit (INDEPENDENT_AMBULATORY_CARE_PROVIDER_SITE_OTHER): Payer: Medicare HMO | Admitting: General Surgery

## 2014-07-29 ENCOUNTER — Encounter: Payer: Self-pay | Admitting: General Surgery

## 2014-07-29 VITALS — BP 144/84 | HR 86 | Resp 16 | Ht 64.0 in | Wt 291.0 lb

## 2014-07-29 DIAGNOSIS — C50911 Malignant neoplasm of unspecified site of right female breast: Secondary | ICD-10-CM

## 2014-07-29 DIAGNOSIS — D0511 Intraductal carcinoma in situ of right breast: Secondary | ICD-10-CM | POA: Diagnosis not present

## 2014-07-29 NOTE — Progress Notes (Addendum)
Patient ID: Maria Gutierrez, female   DOB: [birth year], 74 y.o.   MRN: [id number]  Chief Complaint  Patient presents with  . Procedure    right mammosite    HPI Maria Gutierrez is a 74 y.o. female. Here today for right breast mammosite placement. The patient was evaluated by radiation oncology and felt to be a suitable candidate. HPI  Past Medical History  Diagnosis Date  . Hyperlipidemia   . Hypertension   . GERD (gastroesophageal reflux disease)   . Arthritis   . Heart murmur   . Anxiety   . 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    Past Surgical History  Procedure Laterality Date  . Abdominal hysterectomy    . Kneee surgery Left   . Cholecystectomy  2012  . Colonoscopy  2009  . Breast biopsy Left 2015  . 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  . Eye surgery    . Axillary lymph node biopsy Right 07/14/2014    Procedure: AXILLARY LYMPH NODE BIOPSY;  Surgeon: Robert Bellow, MD;  Location: [facility] ORS;  Service: General;  Laterality: Right;    No family history on file.  Social History History  Substance Use Topics  . Smoking status: Never Smoker   . Smokeless tobacco: Never Used  . Alcohol Use: No    No Known Allergies  Current Outpatient Prescriptions  Medication Sig Dispense Refill  . aspirin 81 MG tablet Take 81 mg by mouth daily.    . calcium carbonate (OS-CAL) 600 MG TABS tablet Take 600 mg by mouth daily.    . cefadroxil (DURICEF) 500 MG capsule Take 1 capsule (500 mg total) by mouth 2 (two) times daily. Start one hour before office procedure on 07-29-14 20 capsule 0  . cholecalciferol (VITAMIN D) 1000 UNITS tablet Take 1,000 Units by mouth daily.    [name] doxycycline (VIBRA-TABS) 100 MG tablet     . hydrochlorothiazide (HYDRODIURIL) 25 MG tablet     . lansoprazole  (PREVACID) 15 MG capsule Take 15 mg by mouth every morning.     . meloxicam (MOBIC) 15 MG tablet     . metoprolol succinate (TOPROL-XL) 50 MG 24 hr tablet Take 50 mg by mouth every morning. Take with or immediately following a meal.    . PARoxetine (PAXIL) 40 MG tablet Take 40 mg by mouth at bedtime.     . simvastatin (ZOCOR) 40 MG tablet Take 40 mg by mouth at bedtime.     . traMADol (ULTRAM) 50 MG tablet Take by mouth every 6 (six) hours as needed.     No current facility-administered medications for this visit.    Review of Systems Review of Systems  Constitutional: Negative.   Respiratory: Negative.   Cardiovascular: Negative.     Blood pressure 144/84, pulse 86, resp. rate 16, height '5\' 4"'  (1.626 m), weight 291 lb (131.997 kg).  Physical Exam Physical Exam  Pulmonary/Chest:      Data Reviewed Radiation oncology consultation note reviewed.  Assessment    Candidate for partial breast radiation.    Plan    The procedure was reviewed with the patient. She was amenable to proceed. Informed consent was obtained.  Ultrasound confirmed a small seroma cavity as previously identified. 10 mL of 0.5% Xylocaine with 0.25% Marcaine with  1-200,000 of epinephrine was utilized well tolerated. Chlor prep was applied to the skin. The 8 mm trocar was advanced into the seroma cavity under ultrasound guidance. The cavity evaluation device was inflated with 40 mL of saline with an adequate buffer of just under 1.3 cm. This was removed and the treatment balloon placed. This was inflated with 40 mL of saline mixed with Omnipaque. Final skin spacing was 1.28-1.38 cm. The balloon was spherical on evaluation both prior to insertion and after insertion. Minimal discomfort during inflation was appreciated. This had resolved by the end of the procedure. Bacitracin was applied to the balloon exit site. The soft stylette was lost from the sterile tray and a simple sterile cap  was applied to the end of the  treatment channel. Soft gauze padding applied.  The patient will undergo simulation on June 2 and begin treatment on June 3. We'll plan for a follow-up after [place] at the beach in 3 weeks.  She will be a candidate for an aromatase inhibitor after completing her radiation treatment.     PCP:  Virgie Dad 07/29/2014, 10:36 AM

## 2014-07-29 NOTE — Addendum Note (Signed)
Addended by: Robert Bellow on: 07/29/2014 10:41 AM   Modules accepted: Level of Service

## 2014-07-29 NOTE — Patient Instructions (Signed)
Patient care kit given to patient.  Instructed no showers, sponge bath while mammosite in place, take antibiotic. Follow up with [facility] as arranged. Discussed wearing your bra for support at all times. 

## 2014-07-31 ENCOUNTER — Ambulatory Visit
Admission: RE | Admit: 2014-07-31 | Discharge: 2014-07-31 | Disposition: A | Discharge status: Home / Self Care | Payer: Medicare HMO | Source: Ambulatory Visit | Attending: Radiation Oncology | Admitting: Radiation Oncology

## 2014-07-31 DIAGNOSIS — Z510 Encounter for antineoplastic radiation therapy: Secondary | ICD-10-CM | POA: Diagnosis present

## 2014-07-31 DIAGNOSIS — D0511 Intraductal carcinoma in situ of right breast: Secondary | ICD-10-CM | POA: Diagnosis not present

## 2014-08-01 ENCOUNTER — Ambulatory Visit: Payer: Medicare HMO

## 2014-08-01 DIAGNOSIS — D0511 Intraductal carcinoma in situ of right breast: Secondary | ICD-10-CM | POA: Diagnosis not present

## 2014-08-04 ENCOUNTER — Ambulatory Visit
Admission: RE | Admit: 2014-08-04 | Discharge: 2014-08-04 | Disposition: A | Discharge status: Home / Self Care | Payer: Medicare HMO | Source: Ambulatory Visit | Attending: Radiation Oncology | Admitting: Radiation Oncology

## 2014-08-04 DIAGNOSIS — D0511 Intraductal carcinoma in situ of right breast: Secondary | ICD-10-CM | POA: Diagnosis not present

## 2014-08-05 ENCOUNTER — Ambulatory Visit
Admission: RE | Admit: 2014-08-05 | Discharge: 2014-08-05 | Disposition: A | Discharge status: Home / Self Care | Payer: Medicare HMO | Source: Ambulatory Visit | Attending: Radiation Oncology | Admitting: Radiation Oncology

## 2014-08-05 DIAGNOSIS — D0511 Intraductal carcinoma in situ of right breast: Secondary | ICD-10-CM | POA: Diagnosis not present

## 2014-08-06 ENCOUNTER — Ambulatory Visit
Admission: RE | Admit: 2014-08-06 | Discharge: 2014-08-06 | Disposition: A | Discharge status: Home / Self Care | Payer: Medicare HMO | Source: Ambulatory Visit | Attending: Radiation Oncology | Admitting: Radiation Oncology

## 2014-08-06 DIAGNOSIS — D0511 Intraductal carcinoma in situ of right breast: Secondary | ICD-10-CM | POA: Diagnosis not present

## 2014-08-07 ENCOUNTER — Ambulatory Visit
Admission: RE | Admit: 2014-08-07 | Discharge: 2014-08-07 | Disposition: A | Discharge status: Home / Self Care | Payer: Medicare HMO | Source: Ambulatory Visit | Attending: Radiation Oncology | Admitting: Radiation Oncology

## 2014-08-07 DIAGNOSIS — D0511 Intraductal carcinoma in situ of right breast: Secondary | ICD-10-CM | POA: Diagnosis not present

## 2014-08-08 ENCOUNTER — Ambulatory Visit
Admission: RE | Admit: 2014-08-08 | Discharge: 2014-08-08 | Disposition: A | Discharge status: Home / Self Care | Payer: Medicare HMO | Source: Ambulatory Visit | Attending: Oncology | Admitting: Oncology

## 2014-08-08 DIAGNOSIS — C50911 Malignant neoplasm of unspecified site of right female breast: Secondary | ICD-10-CM | POA: Insufficient documentation

## 2014-08-08 MED ORDER — TECHNETIUM TC 99M-LABELED RED BLOOD CELLS IV KIT
20.0000 | PACK | Freq: Once | INTRAVENOUS | Status: AC | PRN
  Administered 2014-08-08: 21.37 via INTRAVENOUS

## 2014-08-18 ENCOUNTER — Inpatient Hospital Stay: Payer: Medicare HMO | Attending: Oncology | Admitting: Oncology

## 2014-08-18 VITALS — BP 110/82 | HR 69 | Temp 98.5°F | Wt 297.6 lb

## 2014-08-18 DIAGNOSIS — Z79899 Other long term (current) drug therapy: Secondary | ICD-10-CM | POA: Diagnosis not present

## 2014-08-18 DIAGNOSIS — Z7982 Long term (current) use of aspirin: Secondary | ICD-10-CM | POA: Diagnosis not present

## 2014-08-18 DIAGNOSIS — F419 Anxiety disorder, unspecified: Secondary | ICD-10-CM

## 2014-08-18 DIAGNOSIS — K219 Gastro-esophageal reflux disease without esophagitis: Secondary | ICD-10-CM | POA: Diagnosis not present

## 2014-08-18 DIAGNOSIS — I10 Essential (primary) hypertension: Secondary | ICD-10-CM | POA: Diagnosis not present

## 2014-08-18 DIAGNOSIS — E785 Hyperlipidemia, unspecified: Secondary | ICD-10-CM | POA: Diagnosis not present

## 2014-08-18 DIAGNOSIS — C50911 Malignant neoplasm of unspecified site of right female breast: Secondary | ICD-10-CM

## 2014-08-18 DIAGNOSIS — Z923 Personal history of irradiation: Secondary | ICD-10-CM | POA: Insufficient documentation

## 2014-08-18 DIAGNOSIS — C50411 Malignant neoplasm of upper-outer quadrant of right female breast: Secondary | ICD-10-CM | POA: Diagnosis not present

## 2014-08-18 DIAGNOSIS — Z170 Estrogen receptor positive status [ER+]: Secondary | ICD-10-CM | POA: Diagnosis not present

## 2014-08-18 NOTE — Progress Notes (Signed)
Pt does not have living will.  Never smoked.

## 2014-08-18 NOTE — Patient Instructions (Signed)
Paclitaxel injection What is this medicine? PACLITAXEL (PAK li TAX el) is a chemotherapy drug. It targets fast dividing cells, like cancer cells, and causes these cells to die. This medicine is used to treat ovarian cancer, breast cancer, and other cancers. This medicine may be used for other purposes; ask your health care provider or pharmacist if you have questions. COMMON BRAND NAME(S): Onxol, Taxol What should I tell my health care provider before I take this medicine? They need to know if you have any of these conditions: -blood disorders -irregular heartbeat -infection (especially a virus infection such as chickenpox, cold sores, or herpes) -liver disease -previous or ongoing radiation therapy -an unusual or allergic reaction to paclitaxel, alcohol, polyoxyethylated castor oil, other chemotherapy agents, other medicines, foods, dyes, or preservatives -pregnant or trying to get pregnant -breast-feeding How should I use this medicine? This drug is given as an infusion into a vein. It is administered in a hospital or clinic by a specially trained health care professional. Talk to your pediatrician regarding the use of this medicine in children. Special care may be needed. Overdosage: If you think you have taken too much of this medicine contact a poison control center or emergency room at once. NOTE: This medicine is only for you. Do not share this medicine with others. What if I miss a dose? It is important not to miss your dose. Call your doctor or health care professional if you are unable to keep an appointment. What may interact with this medicine? Do not take this medicine with any of the following medications: -disulfiram -metronidazole This medicine may also interact with the following medications: -cyclosporine -diazepam -ketoconazole -medicines to increase blood counts like filgrastim, pegfilgrastim, sargramostim -other chemotherapy drugs like cisplatin, doxorubicin,  epirubicin, etoposide, teniposide, vincristine -quinidine -testosterone -vaccines -verapamil Talk to your doctor or health care professional before taking any of these medicines: -acetaminophen -aspirin -ibuprofen -ketoprofen -naproxen This list may not describe all possible interactions. Give your health care provider a list of all the medicines, herbs, non-prescription drugs, or dietary supplements you use. Also tell them if you smoke, drink alcohol, or use illegal drugs. Some items may interact with your medicine. What should I watch for while using this medicine? Your condition will be monitored carefully while you are receiving this medicine. You will need important blood work done while you are taking this medicine. This drug may make you feel generally unwell. This is not uncommon, as chemotherapy can affect healthy cells as well as cancer cells. Report any side effects. Continue your course of treatment even though you feel ill unless your doctor tells you to stop. In some cases, you may be given additional medicines to help with side effects. Follow all directions for their use. Call your doctor or health care professional for advice if you get a fever, chills or sore throat, or other symptoms of a cold or flu. Do not treat yourself. This drug decreases your body's ability to fight infections. Try to avoid being around people who are sick. This medicine may increase your risk to bruise or bleed. Call your doctor or health care professional if you notice any unusual bleeding. Be careful brushing and flossing your teeth or using a toothpick because you may get an infection or bleed more easily. If you have any dental work done, tell your dentist you are receiving this medicine. Avoid taking products that contain aspirin, acetaminophen, ibuprofen, naproxen, or ketoprofen unless instructed by your doctor. These medicines may hide a fever.   Do not become pregnant while taking this medicine.  Women should inform their doctor if they wish to become pregnant or think they might be pregnant. There is a potential for serious side effects to an unborn child. Talk to your health care professional or pharmacist for more information. Do not breast-feed an infant while taking this medicine. Men are advised not to father a child while receiving this medicine. What side effects may I notice from receiving this medicine? Side effects that you should report to your doctor or health care professional as soon as possible: -allergic reactions like skin rash, itching or hives, swelling of the face, lips, or tongue -low blood counts - This drug may decrease the number of white blood cells, red blood cells and platelets. You may be at increased risk for infections and bleeding. -signs of infection - fever or chills, cough, sore throat, pain or difficulty passing urine -signs of decreased platelets or bleeding - bruising, pinpoint red spots on the skin, black, tarry stools, nosebleeds -signs of decreased red blood cells - unusually weak or tired, fainting spells, lightheadedness -breathing problems -chest pain -high or low blood pressure -mouth sores -nausea and vomiting -pain, swelling, redness or irritation at the injection site -pain, tingling, numbness in the hands or feet -slow or irregular heartbeat -swelling of the ankle, feet, hands Side effects that usually do not require medical attention (report to your doctor or health care professional if they continue or are bothersome): -bone pain -complete hair loss including hair on your head, underarms, pubic hair, eyebrows, and eyelashes -changes in the color of fingernails -diarrhea -loosening of the fingernails -loss of appetite -muscle or joint pain -red flush to skin -sweating This list may not describe all possible side effects. Call your doctor for medical advice about side effects. You may report side effects to FDA at  1-800-FDA-1088. Where should I keep my medicine? This drug is given in a hospital or clinic and will not be stored at home. NOTE: This sheet is a summary. It may not cover all possible information. If you have questions about this medicine, talk to your doctor, pharmacist, or health care provider.  2015, Elsevier/Gold Standard. (2012-04-09 16:41:21) Trastuzumab injection for infusion What is this medicine? TRASTUZUMAB (tras TOO zoo mab) is a monoclonal antibody. It targets a protein called HER2. This protein is found in some stomach and breast cancers. This medicine can stop cancer cell growth. This medicine may be used with other cancer treatments. This medicine may be used for other purposes; ask your health care provider or pharmacist if you have questions. COMMON BRAND NAME(S): Herceptin What should I tell my health care provider before I take this medicine? They need to know if you have any of these conditions: -heart disease -heart failure -infection (especially a virus infection such as chickenpox, cold sores, or herpes) -lung or breathing disease, like asthma -recent or ongoing radiation therapy -an unusual or allergic reaction to trastuzumab, benzyl alcohol, or other medications, foods, dyes, or preservatives -pregnant or trying to get pregnant -breast-feeding How should I use this medicine? This drug is given as an infusion into a vein. It is administered in a hospital or clinic by a specially trained health care professional. Talk to your pediatrician regarding the use of this medicine in children. This medicine is not approved for use in children. Overdosage: If you think you have taken too much of this medicine contact a poison control center or emergency room at once. NOTE: This medicine is   only for you. Do not share this medicine with others. What if I miss a dose? It is important not to miss a dose. Call your doctor or health care professional if you are unable to keep an  appointment. What may interact with this medicine? -cyclophosphamide -doxorubicin -warfarin This list may not describe all possible interactions. Give your health care provider a list of all the medicines, herbs, non-prescription drugs, or dietary supplements you use. Also tell them if you smoke, drink alcohol, or use illegal drugs. Some items may interact with your medicine. What should I watch for while using this medicine? Visit your doctor for checks on your progress. Report any side effects. Continue your course of treatment even though you feel ill unless your doctor tells you to stop. Call your doctor or health care professional for advice if you get a fever, chills or sore throat, or other symptoms of a cold or flu. Do not treat yourself. Try to avoid being around people who are sick. You may experience fever, chills and shaking during your first infusion. These effects are usually mild and can be treated with other medicines. Report any side effects during the infusion to your health care professional. Fever and chills usually do not happen with later infusions. What side effects may I notice from receiving this medicine? Side effects that you should report to your doctor or other health care professional as soon as possible: -breathing difficulties -chest pain or palpitations -cough -dizziness or fainting -fever or chills, sore throat -skin rash, itching or hives -swelling of the legs or ankles -unusually weak or tired Side effects that usually do not require medical attention (report to your doctor or other health care professional if they continue or are bothersome): -loss of appetite -headache -muscle aches -nausea This list may not describe all possible side effects. Call your doctor for medical advice about side effects. You may report side effects to FDA at 1-800-FDA-1088. Where should I keep my medicine? This drug is given in a hospital or clinic and will not be stored at  home. NOTE: This sheet is a summary. It may not cover all possible information. If you have questions about this medicine, talk to your doctor, pharmacist, or health care provider.  2015, Elsevier/Gold Standard. (2008-12-19 13:43:15)

## 2014-08-19 ENCOUNTER — Ambulatory Visit: Payer: Medicare HMO

## 2014-08-19 ENCOUNTER — Other Ambulatory Visit: Payer: Self-pay | Admitting: *Deleted

## 2014-08-19 DIAGNOSIS — C50911 Malignant neoplasm of unspecified site of right female breast: Secondary | ICD-10-CM

## 2014-08-19 MED ORDER — LIDOCAINE-PRILOCAINE 2.5-2.5 % EX CREA: 1.0000 "application " | TOPICAL_CREAM | CUTANEOUS | Status: DC | PRN

## 2014-08-20 ENCOUNTER — Encounter: Payer: Self-pay | Admitting: General Surgery

## 2014-08-20 ENCOUNTER — Ambulatory Visit (INDEPENDENT_AMBULATORY_CARE_PROVIDER_SITE_OTHER): Payer: Medicare HMO | Admitting: General Surgery

## 2014-08-20 VITALS — BP 126/82 | HR 86 | Resp 18 | Ht 64.0 in | Wt 300.0 lb

## 2014-08-20 DIAGNOSIS — C50911 Malignant neoplasm of unspecified site of right female breast: Secondary | ICD-10-CM

## 2014-08-20 NOTE — Patient Instructions (Signed)
Patient's surgery has been scheduled for 08-27-14 at [facility]. It is okay for patient to continue 81 mg aspirin once daily.

## 2014-08-20 NOTE — Progress Notes (Signed)
Oncology Nurse Navigator Documentation  Oncology Nurse Navigator Flowsheets 08/20/2014  Navigator Encounter Type Clinic/MDC;Education  Patient Visit Type Medonc  Treatment Phase First Chemo Tx  Barriers/Navigation Needs No barriers at this time  Support Groups/Services [facility] to recovery;Breast  Time Spent with Patient 45   Met patient, and husband Trilby Drummer after Chemo class yesterday.  They were unable to stay for tour of [facility].   Returned to [facility] today for tour,  prior to visit with Dr. Bary Castilla to discuss portacath placement.  Patient does not have appointment for first chemotherapy treatment.  Waiting for date of port placement.

## 2014-08-20 NOTE — Progress Notes (Signed)
Patient ID: Maria Gutierrez, female   DOB: [birth year], 74 y.o.   MRN: [id number]  Chief Complaint  Patient presents with  . Follow-up    right mammosite, port placement    HPI Maria Gutierrez is a 74 y.o. female here for a follow up for right mammosite placement. She completed her radiation on 08/07/14. She is also her for evaluation for a port placement. She has already gone thru chemo classes. She reports that she is doing well, just some fatigue.   HPI  Past Medical History  Diagnosis Date  . Hyperlipidemia   . Hypertension   . GERD (gastroesophageal reflux disease)   . Arthritis   . Heart murmur   . Anxiety   . 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    Past Surgical History  Procedure Laterality Date  . Abdominal hysterectomy    . Kneee surgery Left   . Cholecystectomy  2012  . Colonoscopy  2009  . Breast biopsy Left 2015  . 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  . Eye surgery    . Axillary lymph node biopsy Right 07/14/2014    Procedure: AXILLARY LYMPH NODE BIOPSY;  Surgeon: Robert Bellow, MD;  Location: [facility] ORS;  Service: General;  Laterality: Right;    No family history on file.  Social History History  Substance Use Topics  . Smoking status: Never Smoker   . Smokeless tobacco: Never Used  . Alcohol Use: No    No Known Allergies  Current Outpatient Prescriptions  Medication Sig Dispense Refill  . aspirin 81 MG tablet Take 81 mg by mouth daily.    . calcium carbonate (OS-CAL) 600 MG TABS tablet Take 600 mg by mouth daily.    . cefadroxil (DURICEF) 500 MG capsule Take 1 capsule (500 mg total) by mouth 2 (two) times daily. Start one hour before office procedure on 07-29-14 20 capsule 0  . cholecalciferol (VITAMIN D) 1000 UNITS tablet Take 1,000 Units by mouth daily.     . hydrochlorothiazide (HYDRODIURIL) 25 MG tablet     . lansoprazole (PREVACID) 15 MG capsule Take 15 mg by mouth every morning.     . lidocaine-prilocaine (EMLA) cream Apply 1 application topically as needed. 30 g 3  . meloxicam (MOBIC) 15 MG tablet     . metoprolol succinate (TOPROL-XL) 50 MG 24 hr tablet Take 50 mg by mouth every morning. Take with or immediately following a meal.    . PARoxetine (PAXIL) 40 MG tablet Take 40 mg by mouth at bedtime.     . simvastatin (ZOCOR) 40 MG tablet Take 40 mg by mouth at bedtime.     . traMADol (ULTRAM) 50 MG tablet Take by mouth every 6 (six) hours as needed.     No current facility-administered medications for this visit.    Review of Systems Review of Systems  Constitutional: Negative.   Respiratory: Negative.   Cardiovascular: Negative.     Blood pressure 126/82, pulse 86, resp. rate 18, height '5\' 4"'  (1.626 m), weight 300 lb (136.079 kg).  Physical Exam Physical Exam  Constitutional: She is oriented to person, place, and time. She appears well-developed and well-nourished.  Eyes: Conjunctivae are normal. No scleral icterus.  Neck: Neck supple.  Cardiovascular: Normal rate, regular rhythm and normal heart sounds.  Pulmonary/Chest: Effort normal and breath sounds normal.  Lymphadenopathy:    She has no cervical adenopathy.  Neurological: She is alert and oriented to person, place, and time.  Skin: Skin is warm and dry.    Data Reviewed Request from medical oncology for port placement.  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    Plan     The risks associated with central venous access including arterial, pulmonary and venous injury were reviewed. The possible need for additional treatment if pulmonary injury occurs (chest tube placement) was discussed. Patient's surgery has been scheduled for 08-27-14 at [facility]. It is okay for patient to continue 81 mg aspirin once daily.    PCP:  Virgie Dad 08/21/2014, 8:23 PM

## 2014-08-21 NOTE — H&P (Signed)
Patient ID: [name], female DOB: 21-Apr-1940, 74 y.o. MRN: [id number]  Chief Complaint   Patient presents with   .  Follow-up     right mammosite, port placement    HPI  Maria Gutierrez is a 74 y.o. female here for a follow up for right mammosite placement. She completed her radiation on 08/07/14. She is also her for evaluation for a port placement. She has already gone thru chemo classes. She reports that she is doing well, just some fatigue.  HPI  Past Medical History   Diagnosis  Date   .  Hyperlipidemia    .  Hypertension    .  GERD (gastroesophageal reflux disease)    .  Arthritis    .  Heart murmur    .  Anxiety    .  Breast cancer of upper-outer quadrant of right female breast  06/10/2014     T1b,Nx; ER 90%, PR 50-90%, HER-2/neu 2+, FISH positive.    Past Surgical History   Procedure  Laterality  Date   .  Abdominal hysterectomy     .  Kneee surgery  Left    .  Cholecystectomy   2012   .  Colonoscopy   2009   .  Breast biopsy  Left  2015   .  Breast surgery  Right  06/10/14     Wide excision for intermediate grade DCIS, identification of a 10 millimeter area of invasive mammary carcinoma.   .  Eye surgery     .  Axillary lymph node biopsy  Right  07/14/2014     Procedure: AXILLARY LYMPH NODE BIOPSY; Surgeon: Robert Bellow, MD; Location: [facility] ORS; Service: General; Laterality: Right;    No family history on file.  Social History  History   Substance Use Topics   .  Smoking status:  Never Smoker   .  Smokeless tobacco:  Never Used   .  Alcohol Use:  No    No Known Allergies  Current Outpatient Prescriptions   Medication  Sig  Dispense  Refill   .  aspirin 81 MG tablet  Take 81 mg by mouth daily.     .  calcium carbonate (OS-CAL) 600 MG TABS tablet  Take 600 mg by mouth daily.     .  cefadroxil (DURICEF) 500 MG capsule  Take 1 capsule (500 mg total) by mouth 2 (two) times daily. Start one hour before office procedure on 07-29-14  20 capsule  0   .  cholecalciferol  (VITAMIN D) 1000 UNITS tablet  Take 1,000 Units by mouth daily.     .  hydrochlorothiazide (HYDRODIURIL) 25 MG tablet      .  lansoprazole (PREVACID) 15 MG capsule  Take 15 mg by mouth every morning.     .  lidocaine-prilocaine (EMLA) cream  Apply 1 application topically as needed.  30 g  3   .  meloxicam (MOBIC) 15 MG tablet      .  metoprolol succinate (TOPROL-XL) 50 MG 24 hr tablet  Take 50 mg by mouth every morning. Take with or immediately following a meal.     .  PARoxetine (PAXIL) 40 MG tablet  Take 40 mg by mouth at bedtime.     .  simvastatin (ZOCOR) 40 MG tablet  Take 40 mg by mouth at bedtime.     .  traMADol (ULTRAM) 50 MG tablet  Take by mouth every 6 (six) hours as needed.  No current facility-administered medications for this visit.    Review of Systems  Review of Systems  Constitutional: Negative.  Respiratory: Negative.  Cardiovascular: Negative.   Blood pressure 126/82, pulse 86, resp. rate 18, height '5\' 4"'  (1.626 m), weight 300 lb (136.079 kg).  Physical Exam  Physical Exam  Constitutional: She is oriented to person, place, and time. She appears well-developed and well-nourished.  Eyes: Conjunctivae are normal. No scleral icterus.  Neck: Neck supple.  Cardiovascular: Normal rate, regular rhythm and normal heart sounds.  Pulmonary/Chest: Effort normal and breath sounds normal.  Lymphadenopathy:  She has no cervical adenopathy.  Neurological: She is alert and oriented to person, place, and time.  Skin: Skin is warm and dry.   Data Reviewed  Request from medical oncology for port placement.  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   Plan   The risks associated with central venous access including arterial, pulmonary and venous injury were reviewed. The possible need for additional treatment if pulmonary injury occurs (chest tube placement) was discussed.  Patient's surgery has been scheduled for 08-27-14 at [facility].  It is okay for patient to continue 81 mg aspirin once daily.   PCP: Virgie Dad

## 2014-08-22 ENCOUNTER — Inpatient Hospital Stay
Admission: RE | Admit: 2014-08-22 | Discharge: 2014-08-22 | Disposition: A | Discharge status: Home / Self Care | Payer: Medicare HMO | Source: Ambulatory Visit

## 2014-08-22 NOTE — Patient Instructions (Addendum)
  Your procedure is scheduled on: August 27, 2014. Report to Same Day Surgery. To find out your arrival time please call [phone] between 1PM - 3PM on August 26, 2014.  Remember: Instructions that are not followed completely may result in serious medical risk, up to and including death, or upon the discretion of your surgeon and anesthesiologist your surgery may need to be rescheduled.    _x__ 1. Do not eat food or drink liquids after midnight. No gum chewing or hard candies.     ____ 2. No Alcohol for 24 hours before or after surgery.   ____ 3. Bring all medications with you on the day of surgery if instructed.    _x___ 4. Notify your doctor if there is any change in your medical condition     (cold, fever, infections).     Do not wear jewelry, make-up, hairpins, clips or nail polish.  Do not wear lotions, powders, or perfumes. You may wear deodorant.  Do not shave 48 hours prior to surgery. Men may shave face and neck.  Do not bring valuables to the hospital.    [facility] is not responsible for any belongings or valuables.               Contacts, dentures or bridgework may not be worn into surgery.  Leave your suitcase in the car. After surgery it may be brought to your room.  For patients admitted to the hospital, discharge time is determined by your treatment team.   Patients discharged the day of surgery will not be allowed to drive home.    Please read over the following fact sheets that you were given:     __x__ Take these medicines the morning of surgery with A SIP OF WATER:    1. lansoprazole (PREVACID)   2. metoprolol succinate (TOPROL-XL)    ____ Fleet Enema (as directed)   _x___ Use CHG Soap as directed  ____ Use inhalers on the day of surgery  ____ Stop metformin 2 days prior to surgery    ____ Take 1/2 of usual insulin dose the night before surgery and none on the morning of surgery.   __x__ May continue aspirin as directed by Dr. Bary Castilla. ____ Stop  Anti-inflammatories Mobic stop today.    ____ Stop supplements until after surgery.    ____ Bring C-Pap to the hospital.

## 2014-08-22 NOTE — Pre-Procedure Instructions (Signed)
Pt instructed she needs to come to PAT for lab work, (CBC and Met B) prior to surgery if possible.  She plans to come in on Monday August 25, 2014.

## 2014-08-24 ENCOUNTER — Encounter: Payer: Self-pay | Admitting: Oncology

## 2014-08-24 NOTE — Progress Notes (Signed)
[place] @ [facility] Telephone:(336) [phone]  Fax:([phone]) [place]: 1940-06-18  MR#: [id number]  ONG#:[id number]  Patient Care Team: Maria Pink, MD as PCP - General (Family Medicine) Maria Pink, MD as Referring Physician (Family Medicine) Maria Bellow, MD (General Surgery)  CHIEF COMPLAINT:  Chief Complaint  Patient presents with  . Follow-up    VISIT DIAGNOSIS:   No diagnosis found.   Oncology History   1.  Carcinoma of right breast.  Status post lumpectomy and sentinel lymph node evaluation.  Tumor size 1 cm.  Estrogen and progesterone receptor positive.  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  Diagnosis in April of 2016 2.  Accelerated partial breast radiation in June of 2016 3.  Weekly Taxol and Herceptin therapy for adjuvant treatment starting from July of 2016     Invasive ductal carcinoma of breast, stage 1   06/17/2014 Initial Diagnosis Invasive ductal carcinoma of breast, stage 1    Oncology Flowsheet 07/14/2014  ondansetron (ZOFRAN) IJ -    INTERVAL HISTORY:  HPI: Patient is a pleasant 74 year old female who presented with an abnormal mammogram on 05/22/2014 showing indeterminate right breast calcifications in the upper outer quadrant of the right breast for which stereotactic guided biopsy was recommended. Biopsy-positive for ductal carcinoma in situ. She went on to have a wide local excision for a 1 cm grade 2 overall invasive mammary carcinoma. Tumor was ER/PR positive and HER-2/neu overexpressed. Lymph vascular invasion was not identified. Lesion was a T1 B lesion. 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 Based on the fact there was invasive component she went on to have a sentinel node biopsy which was negative for metastatic disease. Patient has done well postoperatively. She's been seen by medical oncology and based on that HER-2 nu overexpression is favoring Herceptin plus adjuvant chemotherapy. She is seen today  for consideration of treatment. Patient has morbid obesity also history of hypertension and hyperlipidemia.  June  20th, 2016 Patient is here for ongoing evaluation and consideration of adjuvant chemotherapy.  This finished accelerated partial breast radiation treatment . REVIEW OF SYSTEMS:    GENERAL:  Feels good.  Active.  No fevers, sweats or weight loss. PERFORMANCE STATUS (ECOG):  0 HEENT:  No visual changes, runny nose, sore throat, mouth sores or tenderness. Lungs: No shortness of breath or cough.  No hemoptysis. Cardiac:  No chest pain, palpitations, orthopnea, or PND. GI:  No nausea, vomiting, diarrhea, constipation, melena or hematochezia. GU:  No urgency, frequency, dysuria, or hematuria. Musculoskeletal:  No back pain.  No joint pain.  No muscle tenderness. Extremities:  No pain or swelling. Skin:  No rashes or skin changes. Neuro:  No headache, numbness or weakness, balance or coordination issues. Endocrine:  No diabetes, thyroid issues, hot flashes or night sweats. Psych:  No mood changes, depression or anxiety. Pain:  No focal pain. Review of systems:  All other systems reviewed and found to be negative. As per HPI. Otherwise, a complete review of systems is negatve.  PAST MEDICAL HISTORY: Past Medical History  Diagnosis Date  . Hyperlipidemia   . Hypertension   . GERD (gastroesophageal reflux disease)   . Arthritis   . Heart murmur   . Anxiety   . 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    PAST SURGICAL HISTORY: Past Surgical History  Procedure Laterality Date  . Abdominal hysterectomy    .  Kneee surgery Left   . Cholecystectomy  2012  . Colonoscopy  2009  . Axillary lymph node biopsy Right 07/14/2014    Procedure: AXILLARY LYMPH NODE BIOPSY;  Surgeon: Maria Bellow, MD;  Location: [facility] ORS;  Service: General;  Laterality: Right;  . Breast biopsy Left 2015  .  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  . Eye surgery Bilateral 2014  . Joint replacement Left 2001  . Joint replacement Right 2003  . Joint replacement Left 2004    replacement joint broken    FAMILY HISTORY There is no significant family history of breast cancer, ovarian cancer, colon cancer       ADVANCED DIRECTIVES: Patient does not have any advanced healthcare directive. Information has been given.   HEALTH MAINTENANCE: History  Substance Use Topics  . Smoking status: Never Smoker   . Smokeless tobacco: Never Used  . Alcohol Use: No      Allergies  Allergen Reactions  . Tape Other (See Comments)    Blisters on skin from tape during recent surgery.    Current Outpatient Prescriptions  Medication Sig Dispense Refill  . aspirin 81 MG tablet Take 81 mg by mouth daily.    . calcium carbonate (OS-CAL) 600 MG TABS tablet Take 600 mg by mouth daily.    . cefadroxil (DURICEF) 500 MG capsule Take 1 capsule (500 mg total) by mouth 2 (two) times daily. Start one hour before office procedure on 07-29-14 20 capsule 0  . cholecalciferol (VITAMIN D) 1000 UNITS tablet Take 1,000 Units by mouth daily.    . hydrochlorothiazide (HYDRODIURIL) 25 MG tablet     . lansoprazole (PREVACID) 15 MG capsule Take 15 mg by mouth every morning.     . meloxicam (MOBIC) 15 MG tablet 15 mg every morning.     . metoprolol succinate (TOPROL-XL) 50 MG 24 hr tablet Take 50 mg by mouth every morning. Take with or immediately following a meal.    . PARoxetine (PAXIL) 40 MG tablet Take 40 mg by mouth at bedtime.     . simvastatin (ZOCOR) 40 MG tablet Take 40 mg by mouth at bedtime.     . lidocaine-prilocaine (EMLA) cream Apply 1 application topically as needed. 30 g 3  . traMADol (ULTRAM) 50 MG tablet Take by mouth every 6 (six) hours as needed.     No current facility-administered medications for  this visit.    OBJECTIVE: PHYSICAL EXAM:   Filed Vitals:   08/18/14 1058  BP: 110/82  Pulse: 69  Temp: 98.5 F (36.9 C)     Body mass index is 51.06 kg/(m^2).    ECOG FS:0 - Asymptomatic  LAB RESULTS:  No visits with results within 3 Day(s) from this visit. Latest known visit with results is:  Admission on 07/14/2014, Discharged on 07/14/2014  Component Date Value Ref Range Status  . SURGICAL PATHOLOGY 07/14/2014    Final                   Value:Surgical Pathology CASE: [id number] PATIENT: Maria Gutierrez Surgical Pathology Report     SPECIMEN SUBMITTED: A. Lymph node, sentinel #1  CLINICAL HISTORY: None provided  PRE-OPERATIVE DIAGNOSIS: DCIS Right Breast, Invasive ductal carcinoma  POST-OPERATIVE DIAGNOSIS: None provided     DIAGNOSIS: A. SENTINEL LYMPH NODE #1; EXCISION: - NO TUMOR SEEN IN TWO SENTINEL LYMPH NODES (0/2).   GROSS DESCRIPTION:  A. Intra Operative Consultation:     Received:  fresh     Specimen: sentinel node #1     Pathologic Evaluation: touch prep and frozen section     Diagnosis: A1-A2-Sentinel lymph node #1 excision; no evidence of macro metastasis into 2 Sentinel lymph nodes     Communicated to: Dr. Bary Castilla at 9:50 AM on 07/14/2014, Delorse Lek M.D.     Tissue submitted: 1-2  Labeled: sentinel node #1 Tissue Fragment(s): 1 Measurement: 2.0 x 0.8 x 0.8 cm Comment: dissected to reveal 2 lymph node candidates, the first 0.5 x 0.4 x 0.3 and the second blue tinged, 1.4                          x 1.4 x 0.6 cm, both lymph nodes bisected, the smallest touch prep and the largest frozen, following touch prep and frozen section both formalin fixed  Entirely submitted in cassette(s): 1-smallest lymph node candidate 2-largest lymph node candidate remnant     Final Diagnosis performed by Delorse Lek, MD.  Electronically signed 07/15/2014 2:40:07PM    The electronic signature indicates that the named Attending Pathologist has  evaluated the specimen  Technical component performed at [facility], [address], [place], [place] [address] Lab: [phone] Dir: Darrick Penna. Evette Doffing, MD  Professional component performed at [facility], [facility], [place], [place], [place] [address] Lab: [phone] Dir: Dellia Nims. Rubinas, MD       STUDIES: Nm Cardiac Muga Rest  08/08/2014   CLINICAL DATA:  Breast cancer. Evaluate cardiac function in relation to chemotherapy.  EXAM: NUCLEAR MEDICINE CARDIAC BLOOD POOL IMAGING (MUGA)  TECHNIQUE: Cardiac multi-gated acquisition was performed at rest following intravenous injection of Tc-39mlabeled red blood cells.  RADIOPHARMACEUTICALS:  21.4 mCi Technetium-933mn-vitro labeled red blood cells IV  COMPARISON:  None.  FINDINGS: No focal wall motion abnormality of the left ventricle.  Calculated left ventricular ejection fraction equals 65%  IMPRESSION: Left ventricular ejection fraction equals 65 %.   Electronically Signed   By: StSuzy Bouchard.D.   On: 08/08/2014 12:31   [facility] Complete [employer] [facility] [employer] Axilla  07/29/2014   The procedure was reviewed with the patient. She was amenable to proceed.  Informed consent was obtained.  Ultrasound confirmed a small seroma cavity as previously identified. 10 mL  of 0.5% Xylocaine with 0.25% Marcaine with 1-200,000 of epinephrine was  utilized well tolerated. Chlor prep was applied to the skin. The 8 mm  trocar was advanced into the seroma cavity under ultrasound guidance. The  cavity evaluation device was inflated with 40 mL of saline with an  adequate buffer of just under 1.3 cm. This was removed and the treatment  balloon placed. This was inflated with 40 mL of saline mixed with  Omnipaque. Minimal discomfort during inflation was appreciated. This was  all by the end of the procedure. Bacitracin was applied to the balloon  exit site. The soft stylette was lost from the sterile tray and a simple  sterile Was applied to the end of  the treatment channel. Soft gauze  padding applied.    ASSESSMENT:  [age]ear old lady with a history of carcinoma of breast stage IB Sentinel lymph nodes negative Estrogen receptor progesterone receptor positive HER-2/neu receptor and overexpressed  Patient has finished accelerated breast radiation therapy   PLAN:   I had prolonged discussion with patient.  Considering an CCN guidelines possibility of weekly Taxol 12 and Herceptin  for 12 months every 3 weeks apart needs to be considered.  Basis performance status is fairly good and patient may likely to benefit from adjuvant chemotherapy.. I discussed the plan with the patient.  She may need port placement following adjuvant chemotherapy patient will need anti-hormonal therapy for 5 years with an aromatase inhibitor Patient expressed understanding and was in agreement with this plan. She also understands that She can call clinic at any time with any questions, concerns, or complaints.  Total duration of visit was 45 minutes.  50% or more time was spent in counseling patient and family regarding prognosis and options of treatment and available resources. Intent of chemotherapy   is   Cure All the side effects of chemotherapy including myelosuppression, alopecia, nausea vomiting fatigue weakness.  Secondary infection, and   peripheral neuropathy .  Has been discussed in details. Informal consent has been obtained and will be documented by nurses in the chart   Invasive ductal carcinoma of breast, stage 1   Staging form: Breast, AJCC 7th Edition     Clinical: Stage IA (T1b, N0, M0) - Signed by Forest Gleason, MD on 07/28/2014   Forest Gleason, MD   08/24/2014 9:01 AM

## 2014-08-25 ENCOUNTER — Encounter
Admission: RE | Admit: 2014-08-25 | Discharge: 2014-08-25 | Disposition: A | Discharge status: Home / Self Care | Payer: Medicare HMO | Source: Ambulatory Visit | Attending: Anesthesiology | Admitting: Anesthesiology

## 2014-08-25 ENCOUNTER — Other Ambulatory Visit: Payer: Self-pay | Admitting: Oncology

## 2014-08-25 DIAGNOSIS — R011 Cardiac murmur, unspecified: Secondary | ICD-10-CM | POA: Diagnosis not present

## 2014-08-25 DIAGNOSIS — E785 Hyperlipidemia, unspecified: Secondary | ICD-10-CM | POA: Diagnosis not present

## 2014-08-25 DIAGNOSIS — I10 Essential (primary) hypertension: Secondary | ICD-10-CM | POA: Diagnosis not present

## 2014-08-25 DIAGNOSIS — Z90710 Acquired absence of both cervix and uterus: Secondary | ICD-10-CM | POA: Diagnosis not present

## 2014-08-25 DIAGNOSIS — F419 Anxiety disorder, unspecified: Secondary | ICD-10-CM | POA: Diagnosis not present

## 2014-08-25 DIAGNOSIS — K219 Gastro-esophageal reflux disease without esophagitis: Secondary | ICD-10-CM | POA: Diagnosis not present

## 2014-08-25 DIAGNOSIS — M1990 Unspecified osteoarthritis, unspecified site: Secondary | ICD-10-CM | POA: Diagnosis not present

## 2014-08-25 DIAGNOSIS — Z9049 Acquired absence of other specified parts of digestive tract: Secondary | ICD-10-CM | POA: Diagnosis not present

## 2014-08-25 DIAGNOSIS — Z7982 Long term (current) use of aspirin: Secondary | ICD-10-CM | POA: Diagnosis not present

## 2014-08-25 DIAGNOSIS — Z79899 Other long term (current) drug therapy: Secondary | ICD-10-CM | POA: Diagnosis not present

## 2014-08-25 DIAGNOSIS — Z9889 Other specified postprocedural states: Secondary | ICD-10-CM | POA: Diagnosis not present

## 2014-08-25 DIAGNOSIS — C50911 Malignant neoplasm of unspecified site of right female breast: Secondary | ICD-10-CM | POA: Diagnosis not present

## 2014-08-25 DIAGNOSIS — Z791 Long term (current) use of non-steroidal anti-inflammatories (NSAID): Secondary | ICD-10-CM | POA: Diagnosis not present

## 2014-08-25 LAB — BASIC METABOLIC PANEL
ANION GAP: 11 (ref 5–15)
BUN: 17 mg/dL (ref 6–20)
CALCIUM: 9.6 mg/dL (ref 8.9–10.3)
CO2: 28 mmol/L (ref 22–32)
Chloride: 103 mmol/L (ref 101–111)
Creatinine, Ser: 1.01 mg/dL — ABNORMAL HIGH (ref 0.44–1.00)
GFR calc Af Amer: >60 mL/min (ref >60)
GFR calc non Af Amer: 54 mL/min — ABNORMAL LOW (ref >60)
Glucose, Bld: 100 mg/dL — ABNORMAL HIGH (ref 65–99)
Potassium: 3.5 mmol/L (ref 3.5–5.1)
SODIUM: 142 mmol/L (ref 135–145)

## 2014-08-25 LAB — CBC
HCT: 42.1 % (ref 35.0–47.0)
Hemoglobin: 14.4 g/dL (ref 12.0–16.0)
MCH: 32.3 pg (ref 26.0–34.0)
MCHC: 34.1 g/dL (ref 32.0–36.0)
MCV: 94.7 fL (ref 80.0–100.0)
Platelets: 157 10*3/uL (ref 150–440)
RBC: 4.45 MIL/uL (ref 3.80–5.20)
RDW: 13.4 % (ref 11.5–14.5)
WBC: 6.9 10*3/uL (ref 3.6–11.0)

## 2014-08-25 LAB — DIFFERENTIAL
Basophils Absolute: 0 10*3/uL (ref 0–0.1)
Basophils Relative: 1 %
EOS PCT: 2 %
Eosinophils Absolute: 0.1 10*3/uL (ref 0–0.7)
Lymphocytes Relative: 21 %
Lymphs Abs: 1.5 10*3/uL (ref 1.0–3.6)
MONO ABS: 0.7 10*3/uL (ref 0.2–0.9)
Monocytes Relative: 10 %
Neutro Abs: 4.7 10*3/uL (ref 1.4–6.5)
Neutrophils Relative %: 66 %

## 2014-08-27 ENCOUNTER — Ambulatory Visit: Payer: Medicare HMO

## 2014-08-27 ENCOUNTER — Ambulatory Visit
Admission: RE | Admit: 2014-08-27 | Discharge: 2014-08-27 | Disposition: A | Discharge status: Home / Self Care | Payer: Medicare HMO | Source: Ambulatory Visit | Attending: General Surgery | Admitting: General Surgery

## 2014-08-27 ENCOUNTER — Encounter: Payer: Self-pay | Admitting: *Deleted

## 2014-08-27 ENCOUNTER — Encounter
Admission: RE | Disposition: A | Discharge status: Home / Self Care | Payer: Self-pay | Source: Ambulatory Visit | Attending: General Surgery

## 2014-08-27 ENCOUNTER — Ambulatory Visit: Payer: Medicare HMO | Admitting: Anesthesiology

## 2014-08-27 DIAGNOSIS — K219 Gastro-esophageal reflux disease without esophagitis: Secondary | ICD-10-CM | POA: Insufficient documentation

## 2014-08-27 DIAGNOSIS — Z7982 Long term (current) use of aspirin: Secondary | ICD-10-CM | POA: Insufficient documentation

## 2014-08-27 DIAGNOSIS — Z90710 Acquired absence of both cervix and uterus: Secondary | ICD-10-CM | POA: Insufficient documentation

## 2014-08-27 DIAGNOSIS — Z95828 Presence of other vascular implants and grafts: Secondary | ICD-10-CM

## 2014-08-27 DIAGNOSIS — R011 Cardiac murmur, unspecified: Secondary | ICD-10-CM | POA: Insufficient documentation

## 2014-08-27 DIAGNOSIS — Z791 Long term (current) use of non-steroidal anti-inflammatories (NSAID): Secondary | ICD-10-CM | POA: Insufficient documentation

## 2014-08-27 DIAGNOSIS — M1990 Unspecified osteoarthritis, unspecified site: Secondary | ICD-10-CM | POA: Insufficient documentation

## 2014-08-27 DIAGNOSIS — C50911 Malignant neoplasm of unspecified site of right female breast: Secondary | ICD-10-CM | POA: Diagnosis not present

## 2014-08-27 DIAGNOSIS — Z9049 Acquired absence of other specified parts of digestive tract: Secondary | ICD-10-CM | POA: Insufficient documentation

## 2014-08-27 DIAGNOSIS — F419 Anxiety disorder, unspecified: Secondary | ICD-10-CM | POA: Insufficient documentation

## 2014-08-27 DIAGNOSIS — Z79899 Other long term (current) drug therapy: Secondary | ICD-10-CM | POA: Insufficient documentation

## 2014-08-27 DIAGNOSIS — Z9889 Other specified postprocedural states: Secondary | ICD-10-CM | POA: Insufficient documentation

## 2014-08-27 DIAGNOSIS — E785 Hyperlipidemia, unspecified: Secondary | ICD-10-CM | POA: Insufficient documentation

## 2014-08-27 DIAGNOSIS — I10 Essential (primary) hypertension: Secondary | ICD-10-CM | POA: Insufficient documentation

## 2014-08-27 HISTORY — PX: PORTACATH PLACEMENT: SHX2246

## 2014-08-27 SURGERY — INSERTION, TUNNELED CENTRAL VENOUS DEVICE, WITH PORT
Anesthesia: Monitor Anesthesia Care | Laterality: Left | Wound class: Clean

## 2014-08-27 MED ORDER — LIDOCAINE HCL (PF) 1 % IJ SOLN
INTRAMUSCULAR | Status: AC
  Filled 2014-08-27: qty 30

## 2014-08-27 MED ORDER — HYDROCODONE-ACETAMINOPHEN 5-325 MG PO TABS: 1.0000 | ORAL_TABLET | Freq: Four times a day (QID) | ORAL | Status: DC | PRN

## 2014-08-27 MED ORDER — MIDAZOLAM HCL 2 MG/2ML IJ SOLN
INTRAMUSCULAR | Status: DC | PRN
  Administered 2014-08-27 (×2): 1 mg via INTRAVENOUS

## 2014-08-27 MED ORDER — ONDANSETRON HCL 4 MG/2ML IJ SOLN
INTRAMUSCULAR | Status: DC | PRN
  Administered 2014-08-27: 4 mg via INTRAVENOUS

## 2014-08-27 MED ORDER — FENTANYL CITRATE (PF) 100 MCG/2ML IJ SOLN: 25.0000 ug | INTRAMUSCULAR | Status: DC | PRN

## 2014-08-27 MED ORDER — SODIUM CHLORIDE 0.9 % IJ SOLN
INTRAMUSCULAR | Status: AC
  Filled 2014-08-27: qty 50

## 2014-08-27 MED ORDER — SODIUM CHLORIDE 0.9 % IJ SOLN
INTRAMUSCULAR | Status: DC | PRN
  Administered 2014-08-27: 20 mL via INTRAVENOUS

## 2014-08-27 MED ORDER — DEXTROSE 5 % IV SOLN
3.0000 g | INTRAVENOUS | Status: AC
  Administered 2014-08-27: 3 g via INTRAVENOUS
  Filled 2014-08-27: qty 3000

## 2014-08-27 MED ORDER — ONDANSETRON HCL 4 MG/2ML IJ SOLN: 4.0000 mg | Freq: Once | INTRAMUSCULAR | Status: DC | PRN

## 2014-08-27 MED ORDER — LACTATED RINGERS IV SOLN
INTRAVENOUS | Status: DC
  Administered 2014-08-27: 12:00:00 via INTRAVENOUS

## 2014-08-27 MED ORDER — FENTANYL CITRATE (PF) 100 MCG/2ML IJ SOLN
INTRAMUSCULAR | Status: DC | PRN
  Administered 2014-08-27: 25 ug via INTRAVENOUS
  Administered 2014-08-27: 50 ug via INTRAVENOUS
  Administered 2014-08-27: 25 ug via INTRAVENOUS

## 2014-08-27 MED ORDER — LIDOCAINE HCL (PF) 1 % IJ SOLN
INTRAMUSCULAR | Status: DC | PRN
  Administered 2014-08-27: 15 mL via SUBCUTANEOUS

## 2014-08-27 MED ORDER — HYDROCODONE-ACETAMINOPHEN 5-325 MG PO TABS
ORAL_TABLET | ORAL | Status: AC
  Filled 2014-08-27: qty 1

## 2014-08-27 SURGICAL SUPPLY — 31 items
APL SKNCLS STERI-STRIP NONHPOA (GAUZE/BANDAGES/DRESSINGS) ×1
BENZOIN TINCTURE PRP APPL 2/3 (GAUZE/BANDAGES/DRESSINGS) ×2 IMPLANT
BLADE SURG 15 STRL SS SAFETY (BLADE) ×3 IMPLANT
CHLORAPREP W/TINT 26ML (MISCELLANEOUS) ×3 IMPLANT
CLOSURE WOUND 1/2 X4 (GAUZE/BANDAGES/DRESSINGS) ×1
COVER LIGHT HANDLE STERIS (MISCELLANEOUS) ×6 IMPLANT
DECANTER SPIKE VIAL GLASS SM (MISCELLANEOUS) ×6 IMPLANT
DRAPE C-ARM XRAY 36X54 (DRAPES) ×3 IMPLANT
DRAPE LAPAROTOMY TRNSV 106X77 (MISCELLANEOUS) ×3 IMPLANT
DRESSING TELFA 4X3 1S ST N-ADH (GAUZE/BANDAGES/DRESSINGS) ×3 IMPLANT
DRSG TEGADERM 2-3/8X2-3/4 SM (GAUZE/BANDAGES/DRESSINGS) ×3 IMPLANT
DRSG TEGADERM 4X4.75 (GAUZE/BANDAGES/DRESSINGS) ×3 IMPLANT
DRSG TELFA 3X8 NADH (GAUZE/BANDAGES/DRESSINGS) ×3 IMPLANT
GLOVE BIO SURGEON STRL SZ7.5 (GLOVE) ×5 IMPLANT
GLOVE INDICATOR 8.0 STRL GRN (GLOVE) ×5 IMPLANT
GOWN STRL REUS W/ TWL LRG LVL3 (GOWN DISPOSABLE) ×2 IMPLANT
GOWN STRL REUS W/TWL LRG LVL3 (GOWN DISPOSABLE) ×6
KIT RM TURNOVER STRD PROC AR (KITS) ×3 IMPLANT
LABEL OR SOLS (LABEL) ×3 IMPLANT
NS IRRIG 500ML POUR BTL (IV SOLUTION) ×3 IMPLANT
PACK PORT-A-CATH (MISCELLANEOUS) ×3 IMPLANT
PAD DRESSING TELFA 3X8 NADH (GAUZE/BANDAGES/DRESSINGS) IMPLANT
PAD GROUND ADULT SPLIT (MISCELLANEOUS) ×3 IMPLANT
PORTACATH POWER 8F (Port) ×3 IMPLANT
STRIP CLOSURE SKIN 1/2X4 (GAUZE/BANDAGES/DRESSINGS) ×2 IMPLANT
SUT PROLENE 3 0 SH DA (SUTURE) ×3 IMPLANT
SUT VIC AB 3-0 SH 27 (SUTURE) ×3
SUT VIC AB 3-0 SH 27X BRD (SUTURE) ×1 IMPLANT
SUT VIC AB 4-0 FS2 27 (SUTURE) ×3 IMPLANT
SWABSTK COMLB BENZOIN TINCTURE (MISCELLANEOUS) ×3 IMPLANT
SYR CONTROL 10ML (SYRINGE) ×4 IMPLANT

## 2014-08-27 NOTE — H&P (Signed)
No change in cardiopulmonary status.  For power port placement today.

## 2014-08-27 NOTE — Anesthesia Preprocedure Evaluation (Signed)
Anesthesia Evaluation  Patient identified by MRN, date of birth, ID band Patient awake    Reviewed: Allergy & Precautions, H&P , NPO status , Patient's Chart, lab work & pertinent test results, reviewed documented beta blocker date and time   Airway Mallampati: II  TM Distance: >3 FB Neck ROM: full    Dental no notable dental hx.    Pulmonary neg pulmonary ROS,  breath sounds clear to auscultation  Pulmonary exam normal       Cardiovascular Exercise Tolerance: Good hypertension, negative cardio ROS  - Valvular Problems/MurmursMVP Rhythm:regular Rate:Normal     Neuro/Psych negative neurological ROS  negative psych ROS   GI/Hepatic negative GI ROS, Neg liver ROS, GERD-  ,  Endo/Other  negative endocrine ROS  Renal/GU negative Renal ROS  negative genitourinary   Musculoskeletal   Abdominal   Peds  Hematology negative hematology ROS (+)   Anesthesia Other Findings   Reproductive/Obstetrics negative OB ROS                             Anesthesia Physical Anesthesia Plan  ASA: III  Anesthesia Plan: MAC   Post-op Pain Management:    Induction:   Airway Management Planned:   Additional Equipment:   Intra-op Plan:   Post-operative Plan:   Informed Consent: I have reviewed the patients History and Physical, chart, labs and discussed the procedure including the risks, benefits and alternatives for the proposed anesthesia with the patient or authorized representative who has indicated his/her understanding and acceptance.   Dental Advisory Given  Plan Discussed with: CRNA  Anesthesia Plan Comments:         Anesthesia Quick Evaluation

## 2014-08-27 NOTE — Op Note (Signed)
Preoperative diagnosis: Right breast cancer requiring adjuvant chemotherapy.  Postoperative diagnosis: Same.  Operative procedure: Left subclavian PowerPort placement with ultrasound and fluoroscopic guidance.  Operating surgeon Ollen Bowl, M.D.  Anesthesia: Attended local, 15 mL 1% Xylocaine plain.  Clinical note this 74 year old has breast cancer is a candidate for adjuvant chemotherapy and Central venous access was requested by her treating oncologist.  Operative note: With the patient currently supine on the table the area scrubbed with chlor prep and drape. Ultrasound was used to confirm patency of the left subclavian vein. In Trendelenburg position the vein was cannulated under ultrasound guidance. A guidewire was passed into the SVC followed by the dilator and catheter. This was tunneled to a pocket on the left anterior chest. The patient does have a generous layer of adipose tissue. The port was parked over an undrib to provide a firm point for access. The port was anchored to the deep tissue with 3-0 proline sutures. The adipose layer was closed with a running 3-0 Vicryls suture. Skin was closed with a running 4-0 Vicryls suture. Benzoin, Steri-Strips Telfa and Tegaderm dressing applied  Erect portable chest x-ray in the recovery room showed the catheter tip at the junction of the SVC and right atrium. No evidence of pneumothorax. The patient tolerated the procedure well.

## 2014-08-27 NOTE — Discharge Instructions (Signed)
Apply ice to area for comfort. Tylenol: If needed for soreness. Norco (Hydrocodone): If needed for pain.  Avoid repetitive activities with the left arm for the next few days (vacuuming, ironing, etc).

## 2014-08-27 NOTE — Transfer of Care (Signed)
Immediate Anesthesia Transfer of Care Note  Patient: Maria Gutierrez  Procedure(s) Performed: Procedure(s): INSERTION PORT-A-CATH (Left)  Patient Location: PACU  Anesthesia Type:MAC  Level of Consciousness: awake, alert  and oriented  Airway & Oxygen Therapy: Patient Spontanous Breathing and Patient connected to nasal cannula oxygen  Post-op Assessment: Report given to RN and Post -op Vital signs reviewed and stable  Post vital signs: Reviewed and stable  Last Vitals:  Filed Vitals:   08/27/14 1324  BP: 144/52  Pulse: 64  Temp: 36.8 C  Resp: 16    Complications: No apparent anesthesia complications

## 2014-08-27 NOTE — Anesthesia Postprocedure Evaluation (Signed)
  Anesthesia Post-op Note  Patient: Maria Gutierrez  Procedure(s) Performed: Procedure(s): INSERTION PORT-A-CATH (Left)  Anesthesia type:MAC  Patient location: PACU  Post pain: Pain level controlled  Post assessment: Post-op Vital signs reviewed, Patient's Cardiovascular Status Stable, Respiratory Function Stable, Patent Airway and No signs of Nausea or vomiting  Post vital signs: Reviewed and stable  Last Vitals:  Filed Vitals:   08/27/14 1334  BP: 111/66  Pulse: 71  Temp:   Resp: 18    Level of consciousness: awake, alert  and patient cooperative  Complications: No apparent anesthesia complications

## 2014-08-28 ENCOUNTER — Other Ambulatory Visit: Payer: Self-pay | Admitting: Oncology

## 2014-08-29 ENCOUNTER — Ambulatory Visit: Payer: Self-pay | Admitting: Urology

## 2014-09-02 ENCOUNTER — Other Ambulatory Visit: Payer: Self-pay | Admitting: *Deleted

## 2014-09-02 DIAGNOSIS — C50911 Malignant neoplasm of unspecified site of right female breast: Secondary | ICD-10-CM

## 2014-09-03 NOTE — Progress Notes (Signed)
Oncology Nurse Navigator Documentation  Oncology Nurse Navigator Flowsheets 09/03/2014  Navigator Encounter Type Telephone  Patient Visit Type Medonc  Treatment Phase First Chemo Tx  Barriers/Navigation Needs Education  Coordination of Care Other  Support Groups/Services -  Time Spent with Patient 15

## 2014-09-03 NOTE — Addendum Note (Signed)
Addendum  created 09/03/14 1229 by Marsh Dolly, CRNA   Modules edited: Anesthesia Responsible Staff

## 2014-09-04 ENCOUNTER — Ambulatory Visit: Payer: Medicare HMO | Admitting: Oncology

## 2014-09-04 ENCOUNTER — Inpatient Hospital Stay: Payer: Medicare HMO | Attending: Oncology

## 2014-09-04 ENCOUNTER — Other Ambulatory Visit: Payer: Medicare HMO

## 2014-09-04 ENCOUNTER — Ambulatory Visit: Payer: Medicare HMO

## 2014-09-04 ENCOUNTER — Inpatient Hospital Stay (HOSPITAL_BASED_OUTPATIENT_CLINIC_OR_DEPARTMENT_OTHER): Payer: Medicare HMO | Admitting: Oncology

## 2014-09-04 VITALS — BP 141/92 | HR 73 | Temp 96.8°F | Wt 301.1 lb

## 2014-09-04 DIAGNOSIS — C50911 Malignant neoplasm of unspecified site of right female breast: Secondary | ICD-10-CM

## 2014-09-04 DIAGNOSIS — Z79899 Other long term (current) drug therapy: Secondary | ICD-10-CM

## 2014-09-04 DIAGNOSIS — Z170 Estrogen receptor positive status [ER+]: Secondary | ICD-10-CM

## 2014-09-04 DIAGNOSIS — Z5111 Encounter for antineoplastic chemotherapy: Secondary | ICD-10-CM | POA: Diagnosis not present

## 2014-09-04 LAB — COMPREHENSIVE METABOLIC PANEL
ALBUMIN: 4 g/dL (ref 3.5–5.0)
ALK PHOS: 74 U/L (ref 38–126)
ALT: 20 U/L (ref 14–54)
ANION GAP: 5 (ref 5–15)
AST: 29 U/L (ref 15–41)
BILIRUBIN TOTAL: 1.1 mg/dL (ref 0.3–1.2)
BUN: 20 mg/dL (ref 6–20)
CO2: 28 mmol/L (ref 22–32)
Calcium: 8.5 mg/dL — ABNORMAL LOW (ref 8.9–10.3)
Chloride: 101 mmol/L (ref 101–111)
Creatinine, Ser: 1.09 mg/dL — ABNORMAL HIGH (ref 0.44–1.00)
GFR calc non Af Amer: 49 mL/min — ABNORMAL LOW (ref >60)
GFR, EST AFRICAN AMERICAN: 57 mL/min — AB (ref >60)
Glucose, Bld: 91 mg/dL (ref 65–99)
POTASSIUM: 3.4 mmol/L — AB (ref 3.5–5.1)
SODIUM: 134 mmol/L — AB (ref 135–145)
Total Protein: 6.9 g/dL (ref 6.5–8.1)

## 2014-09-04 LAB — CBC WITH DIFFERENTIAL/PLATELET
BASOS ABS: 0.1 10*3/uL (ref 0–0.1)
BASOS PCT: 1 %
Eosinophils Absolute: 0.2 10*3/uL (ref 0–0.7)
Eosinophils Relative: 2 %
HEMATOCRIT: 42.4 % (ref 35.0–47.0)
HEMOGLOBIN: 14.1 g/dL (ref 12.0–16.0)
LYMPHS PCT: 26 %
Lymphs Abs: 1.8 10*3/uL (ref 1.0–3.6)
MCH: 31.3 pg (ref 26.0–34.0)
MCHC: 33.3 g/dL (ref 32.0–36.0)
MCV: 94 fL (ref 80.0–100.0)
MONO ABS: 0.8 10*3/uL (ref 0.2–0.9)
MONOS PCT: 11 %
NEUTROS ABS: 4.3 10*3/uL (ref 1.4–6.5)
Neutrophils Relative %: 60 %
Platelets: 151 10*3/uL (ref 150–440)
RBC: 4.52 MIL/uL (ref 3.80–5.20)
RDW: 13.5 % (ref 11.5–14.5)
WBC: 7.1 10*3/uL (ref 3.6–11.0)

## 2014-09-04 MED ORDER — ONDANSETRON HCL 8 MG PO TABS: 8.0000 mg | ORAL_TABLET | Freq: Two times a day (BID) | ORAL | Status: DC

## 2014-09-04 NOTE — Progress Notes (Signed)
Patient does not have living will.  Declined information.  Never smoked. 

## 2014-09-05 ENCOUNTER — Encounter: Payer: Self-pay | Admitting: Oncology

## 2014-09-05 ENCOUNTER — Inpatient Hospital Stay: Payer: Medicare HMO

## 2014-09-05 ENCOUNTER — Encounter: Payer: Self-pay | Admitting: *Deleted

## 2014-09-05 DIAGNOSIS — C50911 Malignant neoplasm of unspecified site of right female breast: Secondary | ICD-10-CM

## 2014-09-05 DIAGNOSIS — Z5111 Encounter for antineoplastic chemotherapy: Secondary | ICD-10-CM | POA: Diagnosis not present

## 2014-09-05 MED ORDER — SODIUM CHLORIDE 0.9 % IV SOLN
Freq: Once | INTRAVENOUS | Status: AC
  Administered 2014-09-05: 20 mL/h via INTRAVENOUS
  Filled 2014-09-05: qty 1000

## 2014-09-05 MED ORDER — HEPARIN SOD (PORK) LOCK FLUSH 100 UNIT/ML IV SOLN
INTRAVENOUS | Status: AC
  Filled 2014-09-05: qty 5

## 2014-09-05 MED ORDER — ACETAMINOPHEN 325 MG PO TABS
650.0000 mg | ORAL_TABLET | Freq: Once | ORAL | Status: AC
  Administered 2014-09-05: 650 mg via ORAL
  Filled 2014-09-05: qty 2

## 2014-09-05 MED ORDER — SODIUM CHLORIDE 0.9 % IV SOLN
8.0000 mg/kg | Freq: Once | INTRAVENOUS | Status: AC
  Administered 2014-09-05: 1050 mg via INTRAVENOUS
  Filled 2014-09-05: qty 50

## 2014-09-05 MED ORDER — DEXTROSE 5 % IV SOLN
80.0000 mg/m2 | Freq: Once | INTRAVENOUS | Status: AC
  Administered 2014-09-05: 198 mg via INTRAVENOUS
  Filled 2014-09-05: qty 33

## 2014-09-05 MED ORDER — FAMOTIDINE IN NACL 20-0.9 MG/50ML-% IV SOLN
20.0000 mg | Freq: Once | INTRAVENOUS | Status: AC
  Administered 2014-09-05: 20 mg via INTRAVENOUS
  Filled 2014-09-05: qty 50

## 2014-09-05 MED ORDER — DIPHENHYDRAMINE HCL 50 MG/ML IJ SOLN
50.0000 mg | Freq: Once | INTRAMUSCULAR | Status: AC
  Administered 2014-09-05: 50 mg via INTRAVENOUS
  Filled 2014-09-05: qty 1

## 2014-09-05 MED ORDER — SODIUM CHLORIDE 0.9 % IV SOLN
Freq: Once | INTRAVENOUS | Status: AC
  Administered 2014-09-05: 11:00:00 via INTRAVENOUS
  Filled 2014-09-05: qty 4

## 2014-09-05 MED ORDER — HEPARIN SOD (PORK) LOCK FLUSH 100 UNIT/ML IV SOLN
500.0000 [IU] | Freq: Once | INTRAVENOUS | Status: AC | PRN
  Administered 2014-09-05: 500 [IU]

## 2014-09-05 NOTE — Progress Notes (Signed)
[place] @ [facility] [phone]  Fax:([phone]) [place]: [birth year]  MR#: [id number]  GGY#:[id number]  Patient Care Team: [name], MD as PCP - General (Family Medicine) [name], MD as Referring Physician (Family Medicine) Robert Bellow, MD (General Surgery)  CHIEF COMPLAINT:  Chief Complaint  Patient presents with  . Follow-up    VISIT DIAGNOSIS:     ICD-9-CM ICD-10-CM   1. Invasive ductal carcinoma of breast, stage 1, right 174.9 C50.911 ondansetron (ZOFRAN) 8 MG tablet     CBC with Differential     Comprehensive metabolic panel     CBC with Differential     Oncology History   1.  Carcinoma of right breast.  Status post lumpectomy and sentinel lymph node evaluation.  Tumor size 1 cm.  Estrogen and progesterone receptor positive.  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  Diagnosis in April of 2016 2.  Accelerated partial breast radiation in June of 2016 3.  Weekly Taxol and Herceptin therapy for adjuvant treatment starting from July of 2016     Invasive ductal carcinoma of breast, stage 1   06/17/2014 Initial Diagnosis Invasive ductal carcinoma of breast, stage 1    Oncology Flowsheet 07/14/2014 08/27/2014  ondansetron (ZOFRAN) IJ - -  ondansetron (ZOFRAN) IV - -    INTERVAL HISTORY:  HPI: Patient is a pleasant 74 year old female who presented with an abnormal mammogram on 05/22/2014 showing indeterminate right breast calcifications in the upper outer quadrant of the right breast for which stereotactic guided biopsy was recommended. Biopsy-positive for ductal carcinoma in situ. She went on to have a wide local excision for a 1 cm grade 2 overall invasive mammary carcinoma. Tumor was ER/PR positive and HER-2/neu overexpressed. Lymph vascular invasion was not identified. Lesion was a T1 B lesion. 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 Based on the fact there was invasive component she went on to have a sentinel  node biopsy which was negative for metastatic disease. Patient has done well postoperatively. She's been seen by medical oncology and based on that HER-2 nu overexpression is favoring Herceptin plus adjuvant chemotherapy. She is seen today for consideration of treatment. Patient has morbid obesity also history of hypertension and hyperlipidemia.  June  20th, 2016 Patient is here for ongoing evaluation and consideration of adjuvant chemotherapy.  This finished accelerated partial breast radiation treatment . September 04, 2014 Patient is here for ongoing evaluation had a port placement here to initiate chemotherapy with Herceptin and Taxol.  MUGA scan of the heart is within acceptable range.  Patient and number of questions regarding chemotherapy which are answered REVIEW OF SYSTEMS:    GENERAL:  Feels good.  Active.  No fevers, sweats or weight loss. PERFORMANCE STATUS (ECOG):  0 HEENT:  No visual changes, runny nose, sore throat, mouth sores or tenderness. Lungs: No shortness of breath or cough.  No hemoptysis. Cardiac:  No chest pain, palpitations, orthopnea, or PND. GI:  No nausea, vomiting, diarrhea, constipation, melena or hematochezia. GU:  No urgency, frequency, dysuria, or hematuria. Musculoskeletal:  No back pain.  No joint pain.  No muscle tenderness. Extremities:  No pain or swelling. Skin:  No rashes or skin changes. Neuro:  No headache, numbness or weakness, balance or coordination issues. Endocrine:  No diabetes, thyroid issues, hot flashes or night sweats. Psych:  No mood changes, depression or anxiety. Pain:  No focal pain. Review of systems:  All other systems reviewed and found to be  negative. As per HPI. Otherwise, a complete review of systems is negatve.  PAST MEDICAL HISTORY: Past Medical History  Diagnosis Date  . Hyperlipidemia   . Hypertension   . GERD (gastroesophageal reflux disease)   . Arthritis   . Heart murmur   . Anxiety   . Breast cancer of upper-outer  quadrant of right female breast 06/10/2014    T1b,Nx; ER 90%, PR 50-90%, HER-2/neu 2+, FISH positive.    PAST SURGICAL HISTORY: Past Surgical History  Procedure Laterality Date  . Abdominal hysterectomy    . [place] surgery Left   . Cholecystectomy  2012  . Colonoscopy  2009  . Axillary lymph node biopsy Right 07/14/2014    Procedure: AXILLARY LYMPH NODE BIOPSY;  Surgeon: Robert Bellow, MD;  Location: [facility] ORS;  Service: General;  Laterality: Right;  . Breast biopsy Left 2015  . 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  . Eye surgery Bilateral 2014  . Joint replacement Left 2001  . Joint replacement Right 2003  . Joint replacement Left 2004    replacement joint broken  . Portacath placement Left 08/27/2014    Procedure: INSERTION PORT-A-CATH;  Surgeon: Robert Bellow, MD;  Location: [facility] ORS;  Service: General;  Laterality: Left;    FAMILY HISTORY There is no significant family history of breast cancer, ovarian cancer, colon cancer       ADVANCED DIRECTIVES: Patient does not have any advanced healthcare directive. Information has been given.   HEALTH MAINTENANCE: History  Substance Use Topics  . Smoking status: Never Smoker   . Smokeless tobacco: Never Used  . Alcohol Use: No      Allergies  Allergen Reactions  . Tape Other (See Comments)    Blisters on skin from tape during recent surgery.    Current Outpatient Prescriptions  Medication Sig Dispense Refill  . aspirin 81 MG tablet Take 81 mg by mouth daily.    . calcium carbonate (OS-CAL) 600 MG TABS tablet Take 600 mg by mouth daily.    . cholecalciferol (VITAMIN D) 1000 UNITS tablet Take 1,000 Units by mouth daily.    . hydrochlorothiazide (HYDRODIURIL) 25 MG tablet     . HYDROcodone-acetaminophen (NORCO) 5-325 MG per tablet Take 1-2 tablets by mouth every 6 (six) hours as needed for moderate pain. 30  tablet 0  . lansoprazole (PREVACID) 15 MG capsule Take 15 mg by mouth every morning.     . lidocaine-prilocaine (EMLA) cream Apply 1 application topically as needed. 30 g 3  . meloxicam (MOBIC) 15 MG tablet 15 mg every morning.     . metoprolol succinate (TOPROL-XL) 50 MG 24 hr tablet Take 50 mg by mouth every morning. Take with or immediately following a meal.    . PARoxetine (PAXIL) 40 MG tablet Take 40 mg by mouth at bedtime.     . simvastatin (ZOCOR) 40 MG tablet Take 40 mg by mouth at bedtime.     . traMADol (ULTRAM) 50 MG tablet Take by mouth every 6 (six) hours as needed.    . ondansetron (ZOFRAN) 8 MG tablet Take 1 tablet (8 mg total) by mouth 2 (two) times daily. Start the day after chemo for 2 days. Then take as needed for nausea or vomiting. 30 tablet 1   No current facility-administered medications for this visit.    OBJECTIVE: PHYSICAL EXAM:   Filed Vitals:   09/04/14  1528  BP: 141/92  Pulse: 73  Temp: 96.8 F (36 C)     Body mass index is 51.67 kg/(m^2).    ECOG FS:0 - Asymptomatic  LAB RESULTS:  Appointment on 09/04/2014  Component Date Value Ref Range Status  . WBC 09/04/2014 7.1  3.6 - 11.0 K/uL Final  . RBC 09/04/2014 4.52  3.80 - 5.20 MIL/uL Final  . Hemoglobin 09/04/2014 14.1  12.0 - 16.0 g/dL Final  . HCT 09/04/2014 42.4  35.0 - 47.0 % Final  . MCV 09/04/2014 94.0  80.0 - 100.0 fL Final  . MCH 09/04/2014 31.3  26.0 - 34.0 pg Final  . MCHC 09/04/2014 33.3  32.0 - 36.0 g/dL Final  . RDW 09/04/2014 13.5  11.5 - 14.5 % Final  . Platelets 09/04/2014 151  150 - 440 K/uL Final  . Neutrophils Relative % 09/04/2014 60   Final  . Neutro Abs 09/04/2014 4.3  1.4 - 6.5 K/uL Final  . Lymphocytes Relative 09/04/2014 26   Final  . Lymphs Abs 09/04/2014 1.8  1.0 - 3.6 K/uL Final  . Monocytes Relative 09/04/2014 11   Final  . Monocytes Absolute 09/04/2014 0.8  0.2 - 0.9 K/uL Final  . Eosinophils Relative 09/04/2014 2   Final  . Eosinophils Absolute 09/04/2014 0.2  0 -  0.7 K/uL Final  . Basophils Relative 09/04/2014 1   Final  . Basophils Absolute 09/04/2014 0.1  0 - 0.1 K/uL Final  . Sodium 09/04/2014 134* 135 - 145 mmol/L Final  . Potassium 09/04/2014 3.4* 3.5 - 5.1 mmol/L Final  . Chloride 09/04/2014 101  101 - 111 mmol/L Final  . CO2 09/04/2014 28  22 - 32 mmol/L Final  . Glucose, Bld 09/04/2014 91  65 - 99 mg/dL Final  . BUN 09/04/2014 20  6 - 20 mg/dL Final  . Creatinine, Ser 09/04/2014 1.09* 0.44 - 1.00 mg/dL Final  . Calcium 09/04/2014 8.5* 8.9 - 10.3 mg/dL Final  . Total Protein 09/04/2014 6.9  6.5 - 8.1 g/dL Final  . Albumin 09/04/2014 4.0  3.5 - 5.0 g/dL Final  . AST 09/04/2014 29  15 - 41 U/L Final  . ALT 09/04/2014 20  14 - 54 U/L Final  . Alkaline Phosphatase 09/04/2014 74  38 - 126 U/L Final  . Total Bilirubin 09/04/2014 1.1  0.3 - 1.2 mg/dL Final  . GFR calc non Af Amer 09/04/2014 49* >60 mL/min Final  . GFR calc Af Amer 09/04/2014 57* >60 mL/min Final   Comment: (NOTE) The eGFR has been calculated using the CKD EPI equation. This calculation has not been validated in all clinical situations. eGFR's persistently <60 mL/min signify possible Chronic Kidney Disease.   . Anion gap 09/04/2014 5  5 - 15 Final     STUDIES: Dg Chest 1 View  08/27/2014   CLINICAL DATA:  Port-A-Cath insertion  EXAM: CHEST  1 VIEW  COMPARISON:  07/04/2006  FINDINGS: Left-sided Port-A-Cath with the tip located in the right atrium. There is no focal parenchymal opacity. There is no pleural effusion or pneumothorax. The heart and mediastinal contours are unremarkable.  The osseous structures are unremarkable.  IMPRESSION: Left-sided Port-A-Cath with the tip located in the right atrium.   Electronically Signed   By: Kathreen Devoid   On: 08/27/2014 14:02   Nm Cardiac Muga Rest  08/08/2014   CLINICAL DATA:  Breast cancer. Evaluate cardiac function in relation to chemotherapy.  EXAM: NUCLEAR MEDICINE CARDIAC BLOOD POOL IMAGING (MUGA)  TECHNIQUE: Cardiac  multi-gated  acquisition was performed at rest following intravenous injection of Tc-65mlabeled red blood cells.  RADIOPHARMACEUTICALS:  21.4 mCi Technetium-955mn-vitro labeled red blood cells IV  COMPARISON:  None.  FINDINGS: No focal wall motion abnormality of the left ventricle.  Calculated left ventricular ejection fraction equals 65%  IMPRESSION: Left ventricular ejection fraction equals 65 %.   Electronically Signed   By: StSuzy Bouchard.D.   On: 08/08/2014 12:31   Dg C-arm 1-60 Min-no Report  08/27/2014   CLINICAL DATA: catheter placement   C-ARM 1-60 MINUTES  Fluoroscopy was utilized by the requesting physician.  No radiographic  interpretation.     ASSESSMENT:  [age]ear old lady with a history of carcinoma of breast stage IB Sentinel lymph nodes negative Estrogen receptor progesterone receptor positive HER-2/neu receptor and overexpressed  All the side effects of chemotherapy including myelosuppression, alopecia, nausea vomiting fatigue weakness.  Secondary infection, and   peripheral neuropathy .  Has been discussed in details. Informal consent h Consent has been obtained as been obtained and will be documented by nurses in the chart.    Intent of chemotherapy   is   cure   PLAN:  All lab data has been reviewed I had prolonged discussion with patient.  Considering an CCN guidelines possibility of weekly Taxol 12 and Herceptin for 12 months every 3 weeks apart needs to be t  Taxol and Herceptin has been started  Invasive ductal carcinoma of breast, stage 1   Staging form: Breast, AJCC 7th Edition     Clinical: Stage IA (T1b, N0, M0) - Signed by JaForest GleasonMD on 07/28/2014   JaForest GleasonMD   09/05/2014 7:50 AM

## 2014-09-08 ENCOUNTER — Telehealth: Payer: Self-pay | Admitting: *Deleted

## 2014-09-08 MED ORDER — LORATADINE 10 MG PO TABS: 10.0000 mg | ORAL_TABLET | Freq: Every day | ORAL | Status: DC

## 2014-09-08 MED ORDER — HYDROCODONE-ACETAMINOPHEN 5-325 MG PO TABS: 1.0000 | ORAL_TABLET | Freq: Four times a day (QID) | ORAL | Status: DC | PRN

## 2014-09-08 NOTE — Telephone Encounter (Signed)
Spoke with pt tha tshe will need to come to pick up rx for Norco and that Claritin rx was sent to pharmacy

## 2014-09-10 ENCOUNTER — Ambulatory Visit
Admission: RE | Admit: 2014-09-10 | Discharge: 2014-09-10 | Disposition: A | Discharge status: Home / Self Care | Payer: Medicare HMO | Source: Ambulatory Visit | Attending: Radiation Oncology | Admitting: Radiation Oncology

## 2014-09-10 ENCOUNTER — Encounter: Payer: Self-pay | Admitting: Radiation Oncology

## 2014-09-10 ENCOUNTER — Telehealth: Payer: Self-pay | Admitting: *Deleted

## 2014-09-10 VITALS — Temp 97.7°F | Wt 298.7 lb

## 2014-09-10 DIAGNOSIS — C50911 Malignant neoplasm of unspecified site of right female breast: Secondary | ICD-10-CM

## 2014-09-10 DIAGNOSIS — C50919 Malignant neoplasm of unspecified site of unspecified female breast: Secondary | ICD-10-CM

## 2014-09-10 MED ORDER — OXYCODONE HCL 10 MG PO TABS: 10.0000 mg | ORAL_TABLET | Freq: Four times a day (QID) | ORAL | Status: DC | PRN

## 2014-09-10 NOTE — Telephone Encounter (Signed)
Hydrocodone not helping relieve pt's pain. New prescription for oxycodone 10mg  prescribed and given to pt after radiation treatment.

## 2014-09-10 NOTE — Progress Notes (Signed)
Radiation Oncology Follow up Note  Name: Maria Gutierrez   Date:   09/10/2014 MRN:  [id number] DOB: 04/10/1940    This 74 y.o. female presents to the clinic today for follow-up for breast cancer stage I now out 1 month from accelerated partial breast irradiation to her right breast.  REFERRING PROVIDER: Hedrick, James, MD  HPI: patient is a 74-year-old female now 1 month out having completed accelerated partial breast irradiation to her right breast for a T1 BN 0 M0 invasive mammary carcinoma ER/PR positive HER-2/neu overexpressed status post wide local excision and sentinel node biopsy. She seen today in routine follow-up is doing fair secondary to chemotherapy treatments. She's having persistent pain in her lower extremities is currently on narcotic analgesia for that. Just also feels washed out and fatigued. She specifically denies any problems with her breast as regards to her accelerated partial breast irradiation and MammoSite catheter..  COMPLICATIONS OF TREATMENT: none  FOLLOW UP COMPLIANCE: keeps appointments   PHYSICAL EXAM:  Temp(Src) 97.7 F (36.5 C) (Tympanic)  Wt 298 lb 11.6 oz (135.5 kg) Markedly obese female in NADLungs are clear to A&P cardiac examination essentially unremarkable with regular rate and rhythm. No dominant mass or nodularity is noted in either breast in 2 positions examined. Incision is well-healed. No axillary or supraclavicular adenopathy is appreciated. Cosmetic result is excellent. Well-developed well-nourished patient in NAD. HEENT reveals PERLA, EOMI, discs not visualized.  Oral cavity is clear. No oral mucosal lesions are identified. Neck is clear without evidence of cervical or supraclavicular adenopathy. Lungs are clear to A&P. Cardiac examination is essentially unremarkable with regular rate and rhythm without murmur rub or thrill. Abdomen is benign with no organomegaly or masses noted. Motor sensory and DTR levels are equal and symmetric in the upper and  lower extremities. Cranial nerves II through XII are grossly intact. Proprioception is intact. No peripheral adenopathy or edema is identified. No motor or sensory levels are noted. Crude visual fields are within normal range.   RADIOLOGY RESULTS: no recent films for review  PLAN: at the present time from our standpoint she is doing well with no smoking side effects from her am aai catheter placement and high dose rate remote afterloading. She's having some problems with leg pain and other somatic complaints related to chemotherapy which is being addressed by the medical oncology staff. I have asked to see her back in 6 months for follow-up. She continues continues on chemotherapy plus Herceptin at this time. Patient knows to call me sooner with any concerns.  I would like to take this opportunity for allowing me to participate in the care of your patient..    CHRYSTAL,GLENN S., MD   

## 2014-09-12 ENCOUNTER — Inpatient Hospital Stay: Payer: Medicare HMO

## 2014-09-12 VITALS — BP 114/81 | HR 67 | Temp 98.0°F | Resp 18

## 2014-09-12 DIAGNOSIS — Z5111 Encounter for antineoplastic chemotherapy: Secondary | ICD-10-CM | POA: Diagnosis not present

## 2014-09-12 DIAGNOSIS — C50911 Malignant neoplasm of unspecified site of right female breast: Secondary | ICD-10-CM

## 2014-09-12 LAB — CBC WITH DIFFERENTIAL/PLATELET
Basophils Absolute: 0 10*3/uL (ref 0–0.1)
Basophils Relative: 1 %
EOS ABS: 0.1 10*3/uL (ref 0–0.7)
Eosinophils Relative: 3 %
HCT: 39.5 % (ref 35.0–47.0)
HEMOGLOBIN: 13.2 g/dL (ref 12.0–16.0)
Lymphocytes Relative: 32 %
Lymphs Abs: 1.1 10*3/uL (ref 1.0–3.6)
MCH: 31.2 pg (ref 26.0–34.0)
MCHC: 33.5 g/dL (ref 32.0–36.0)
MCV: 93.3 fL (ref 80.0–100.0)
Monocytes Absolute: 0.2 10*3/uL (ref 0.2–0.9)
Monocytes Relative: 5 %
Neutro Abs: 2.1 10*3/uL (ref 1.4–6.5)
Neutrophils Relative %: 59 %
Platelets: 167 10*3/uL (ref 150–440)
RBC: 4.23 MIL/uL (ref 3.80–5.20)
RDW: 13 % (ref 11.5–14.5)
WBC: 3.6 10*3/uL (ref 3.6–11.0)

## 2014-09-12 MED ORDER — FAMOTIDINE IN NACL 20-0.9 MG/50ML-% IV SOLN
20.0000 mg | Freq: Once | INTRAVENOUS | Status: AC
  Administered 2014-09-12: 20 mg via INTRAVENOUS
  Filled 2014-09-12: qty 50

## 2014-09-12 MED ORDER — PACLITAXEL CHEMO INJECTION 300 MG/50ML
80.0000 mg/m2 | Freq: Once | INTRAVENOUS | Status: AC
  Administered 2014-09-12: 198 mg via INTRAVENOUS
  Filled 2014-09-12: qty 33

## 2014-09-12 MED ORDER — DIPHENHYDRAMINE HCL 50 MG/ML IJ SOLN
50.0000 mg | Freq: Once | INTRAMUSCULAR | Status: AC
  Administered 2014-09-12: 50 mg via INTRAVENOUS
  Filled 2014-09-12: qty 1

## 2014-09-12 MED ORDER — SODIUM CHLORIDE 0.9 % IV SOLN
Freq: Once | INTRAVENOUS | Status: AC
  Administered 2014-09-12: 15:00:00 via INTRAVENOUS
  Filled 2014-09-12: qty 4

## 2014-09-12 MED ORDER — SODIUM CHLORIDE 0.9 % IJ SOLN
10.0000 mL | INTRAMUSCULAR | Status: DC | PRN
  Administered 2014-09-12: 10 mL
  Filled 2014-09-12: qty 10

## 2014-09-12 MED ORDER — HEPARIN SOD (PORK) LOCK FLUSH 100 UNIT/ML IV SOLN
500.0000 [IU] | Freq: Once | INTRAVENOUS | Status: AC | PRN
  Administered 2014-09-12: 500 [IU]
  Filled 2014-09-12: qty 5

## 2014-09-12 MED ORDER — SODIUM CHLORIDE 0.9 % IV SOLN
Freq: Once | INTRAVENOUS | Status: AC
  Administered 2014-09-12: 15:00:00 via INTRAVENOUS
  Filled 2014-09-12: qty 1000

## 2014-09-18 ENCOUNTER — Other Ambulatory Visit: Payer: Medicare HMO

## 2014-09-18 ENCOUNTER — Ambulatory Visit: Payer: Medicare HMO | Admitting: Oncology

## 2014-09-19 ENCOUNTER — Ambulatory Visit: Payer: Medicare HMO

## 2014-09-23 ENCOUNTER — Inpatient Hospital Stay (HOSPITAL_BASED_OUTPATIENT_CLINIC_OR_DEPARTMENT_OTHER): Payer: Medicare HMO | Admitting: Oncology

## 2014-09-23 ENCOUNTER — Other Ambulatory Visit: Payer: Medicare HMO

## 2014-09-23 ENCOUNTER — Encounter: Payer: Self-pay | Admitting: Oncology

## 2014-09-23 ENCOUNTER — Ambulatory Visit: Payer: Medicare HMO

## 2014-09-23 ENCOUNTER — Inpatient Hospital Stay: Payer: Medicare HMO

## 2014-09-23 ENCOUNTER — Ambulatory Visit: Payer: Medicare HMO | Admitting: Oncology

## 2014-09-23 VITALS — BP 135/81 | HR 82 | Temp 96.4°F | Wt 296.7 lb

## 2014-09-23 VITALS — BP 112/73 | HR 80 | Resp 18

## 2014-09-23 DIAGNOSIS — Z79899 Other long term (current) drug therapy: Secondary | ICD-10-CM

## 2014-09-23 DIAGNOSIS — E876 Hypokalemia: Secondary | ICD-10-CM

## 2014-09-23 DIAGNOSIS — Z5111 Encounter for antineoplastic chemotherapy: Secondary | ICD-10-CM | POA: Diagnosis not present

## 2014-09-23 DIAGNOSIS — C50911 Malignant neoplasm of unspecified site of right female breast: Secondary | ICD-10-CM

## 2014-09-23 DIAGNOSIS — Z170 Estrogen receptor positive status [ER+]: Secondary | ICD-10-CM | POA: Diagnosis not present

## 2014-09-23 LAB — CBC WITH DIFFERENTIAL/PLATELET
Basophils Absolute: 0 10*3/uL (ref 0–0.1)
Basophils Relative: 0 %
Eosinophils Absolute: 0.1 10*3/uL (ref 0–0.7)
Eosinophils Relative: 2 %
HEMATOCRIT: 39.2 % (ref 35.0–47.0)
Hemoglobin: 13 g/dL (ref 12.0–16.0)
Lymphocytes Relative: 40 %
Lymphs Abs: 1.5 10*3/uL (ref 1.0–3.6)
MCH: 31 pg (ref 26.0–34.0)
MCHC: 33.3 g/dL (ref 32.0–36.0)
MCV: 93.3 fL (ref 80.0–100.0)
MONOS PCT: 16 %
Monocytes Absolute: 0.6 10*3/uL (ref 0.2–0.9)
NEUTROS ABS: 1.5 10*3/uL (ref 1.4–6.5)
NEUTROS PCT: 42 %
PLATELETS: 230 10*3/uL (ref 150–440)
RBC: 4.21 MIL/uL (ref 3.80–5.20)
RDW: 13.3 % (ref 11.5–14.5)
WBC: 3.7 10*3/uL (ref 3.6–11.0)

## 2014-09-23 LAB — COMPREHENSIVE METABOLIC PANEL
ALBUMIN: 3.7 g/dL (ref 3.5–5.0)
ALK PHOS: 67 U/L (ref 38–126)
ALT: 20 U/L (ref 14–54)
AST: 29 U/L (ref 15–41)
Anion gap: 7 (ref 5–15)
BILIRUBIN TOTAL: 1 mg/dL (ref 0.3–1.2)
BUN: 19 mg/dL (ref 6–20)
CALCIUM: 8.6 mg/dL — AB (ref 8.9–10.3)
CO2: 28 mmol/L (ref 22–32)
Chloride: 103 mmol/L (ref 101–111)
Creatinine, Ser: 1.09 mg/dL — ABNORMAL HIGH (ref 0.44–1.00)
GFR calc Af Amer: 57 mL/min — ABNORMAL LOW (ref >60)
GFR calc non Af Amer: 49 mL/min — ABNORMAL LOW (ref >60)
Glucose, Bld: 106 mg/dL — ABNORMAL HIGH (ref 65–99)
POTASSIUM: 3.2 mmol/L — AB (ref 3.5–5.1)
Sodium: 138 mmol/L (ref 135–145)
Total Protein: 6.3 g/dL — ABNORMAL LOW (ref 6.5–8.1)

## 2014-09-23 MED ORDER — THERA VITAL M PO TABS: 1.0000 | ORAL_TABLET | Freq: Every day | ORAL | Status: DC

## 2014-09-23 MED ORDER — POTASSIUM CHLORIDE 10 MEQ/100ML IV SOLN: 10.0000 meq | Freq: Once | INTRAVENOUS | Status: DC

## 2014-09-23 MED ORDER — PACLITAXEL CHEMO INJECTION 300 MG/50ML
80.0000 mg/m2 | Freq: Once | INTRAVENOUS | Status: AC
  Administered 2014-09-23: 198 mg via INTRAVENOUS
  Filled 2014-09-23: qty 33

## 2014-09-23 MED ORDER — SODIUM CHLORIDE 0.9 % IV SOLN
INTRAVENOUS | Status: DC
  Administered 2014-09-23: 11:00:00 via INTRAVENOUS
  Filled 2014-09-23: qty 100

## 2014-09-23 MED ORDER — FAMOTIDINE IN NACL 20-0.9 MG/50ML-% IV SOLN
20.0000 mg | Freq: Once | INTRAVENOUS | Status: AC
  Administered 2014-09-23: 20 mg via INTRAVENOUS
  Filled 2014-09-23: qty 50

## 2014-09-23 MED ORDER — HEPARIN SOD (PORK) LOCK FLUSH 100 UNIT/ML IV SOLN
500.0000 [IU] | Freq: Once | INTRAVENOUS | Status: AC | PRN
  Administered 2014-09-23: 500 [IU]
  Filled 2014-09-23: qty 5

## 2014-09-23 MED ORDER — SODIUM CHLORIDE 0.9 % IJ SOLN
10.0000 mL | INTRAMUSCULAR | Status: DC | PRN
  Administered 2014-09-23: 10 mL
  Filled 2014-09-23: qty 10

## 2014-09-23 MED ORDER — SODIUM CHLORIDE 0.9 % IV SOLN
Freq: Once | INTRAVENOUS | Status: AC
  Administered 2014-09-23: 12:00:00 via INTRAVENOUS
  Filled 2014-09-23: qty 4

## 2014-09-23 MED ORDER — SODIUM CHLORIDE 0.9 % IV SOLN
Freq: Once | INTRAVENOUS | Status: AC
  Administered 2014-09-23: 11:00:00 via INTRAVENOUS
  Filled 2014-09-23: qty 1000

## 2014-09-23 MED ORDER — DIPHENHYDRAMINE HCL 50 MG/ML IJ SOLN
50.0000 mg | Freq: Once | INTRAMUSCULAR | Status: AC
  Administered 2014-09-23: 50 mg via INTRAVENOUS
  Filled 2014-09-23: qty 1

## 2014-09-23 NOTE — Progress Notes (Signed)
[place] @ [facility] [phone]) [phone]  Fax:(336) [place]: December 15, 1940  MR#: [id number]  ZPS#:[id number]  Patient Care Team: [name], MD as PCP - General (Family Medicine) [name], MD as Referring Physician (Family Medicine) Robert Bellow, MD (General Surgery)  CHIEF COMPLAINT:  Chief Complaint  Patient presents with  . Follow-up    VISIT DIAGNOSIS:     ICD-9-CM ICD-10-CM   1. Invasive ductal carcinoma of breast, stage 1, right 174.9 C50.911 Multiple Vitamins-Minerals (MULTIVITAMIN) tablet     CBC with Differential     Basic metabolic panel     Oncology History   1.  Carcinoma of right breast.  Status post lumpectomy and sentinel lymph node evaluation.  Tumor size 1 cm.  Estrogen and progesterone receptor positive.  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  Diagnosis in April of 2016 2.  Accelerated partial breast radiation in June of 2016 3.  Weekly Taxol and Herceptin therapy for adjuvant treatment starting from July of 2016     Invasive ductal carcinoma of breast, stage 1   06/17/2014 Initial Diagnosis Invasive ductal carcinoma of breast, stage 1    Oncology Flowsheet 07/14/2014 08/27/2014 09/05/2014 09/12/2014 09/23/2014  Day, Cycle - - Day 1, Cycle 1 Day 1, Cycle 2 Day 1, Cycle 3  dexamethasone (DECADRON) IV - - [ 20 mg ] [ 20 mg ] [ 10 mg ]  ondansetron (ZOFRAN) IJ - - - - -  ondansetron (ZOFRAN) IV - - [ 8 mg ] [ 8 mg ] [ 8 mg ]  PACLitaxel (TAXOL) IV - - 80 mg/m2 80 mg/m2 80 mg/m2  trastuzumab (HERCEPTIN) IV - - 8 mg/kg - -    INTERVAL HISTORY:  HPI: Patient is a pleasant 74 year old female who presented with an abnormal mammogram on 05/22/2014 showing indeterminate right breast calcifications in the upper outer quadrant of the right breast for which stereotactic guided biopsy was recommended. Biopsy-positive for ductal carcinoma in situ. She went on to have a wide local excision for a 1 cm grade 2 overall invasive mammary  carcinoma. Tumor was ER/PR positive and HER-2/neu overexpressed. Lymph vascular invasion was not identified. Lesion was a T1 B lesion. 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 Based on the fact there was invasive component she went on to have a sentinel node biopsy which was negative for metastatic disease. Patient has done well postoperatively. She's been seen by medical oncology and based on that HER-2 nu overexpression is favoring Herceptin plus adjuvant chemotherapy. She is seen today for consideration of treatment. Patient has morbid obesity also history of hypertension and hyperlipidemia. July 2 6TH  2016  Patient is here for ongoing evaluation and treatment consideration.  Patient had some aches and pains in the muscle which is gradually getting better. . Taking Claritin tolerating treatment very well.  Appetite is improved.  No tingling.  No numbness.  Patient is 4 Herceptin next week REVIEW OF SYSTEMS:    GENERAL:  Feels good.  Active.  No fevers, sweats or weight loss. PERFORMANCE STATUS (ECOG):  0 HEENT:  No visual changes, runny nose, sore throat, mouth sores or tenderness. Lungs: No shortness of breath or cough.  No hemoptysis. Cardiac:  No chest pain, palpitations, orthopnea, or PND. GI:  No nausea, vomiting, diarrhea, constipation, melena or hematochezia. GU:  No urgency, frequency, dysuria, or hematuria. Musculoskeletal:  No back pain.  No joint pain.  No muscle tenderness. Extremities:  No pain or swelling.  Skin:  No rashes or skin changes. Neuro:  No headache, numbness or weakness, balance or coordination issues. Endocrine:  No diabetes, thyroid issues, hot flashes or night sweats. Psych:  No mood changes, depression or anxiety. Pain:  No focal pain. Review of systems:  All other systems reviewed and found to be negative. As per HPI. Otherwise, a complete review of systems is negatve.  PAST MEDICAL HISTORY: Past Medical History  Diagnosis Date  .  Hyperlipidemia   . Hypertension   . GERD (gastroesophageal reflux disease)   . Arthritis   . Heart murmur   . Anxiety   . 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    PAST SURGICAL HISTORY: Past Surgical History  Procedure Laterality Date  . Abdominal hysterectomy    . Kneee surgery Left   . Cholecystectomy  2012  . Colonoscopy  2009  . Axillary lymph node biopsy Right 07/14/2014    Procedure: AXILLARY LYMPH NODE BIOPSY;  Surgeon: Robert Bellow, MD;  Location: [facility] ORS;  Service: General;  Laterality: Right;  . Breast biopsy Left 2015  . 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  . Eye surgery Bilateral 2014  . Joint replacement Left 2001  . Joint replacement Right 2003  . Joint replacement Left 2004    replacement joint broken  . Portacath placement Left 08/27/2014    Procedure: INSERTION PORT-A-CATH;  Surgeon: Robert Bellow, MD;  Location: [facility] ORS;  Service: General;  Laterality: Left;    FAMILY HISTORY There is no significant family history of breast cancer, ovarian cancer, colon cancer       ADVANCED DIRECTIVES: Patient does not have any advanced healthcare directive. Information has been given.   HEALTH MAINTENANCE: History  Substance Use Topics  . Smoking status: Never Smoker   . Smokeless tobacco: Never Used  . Alcohol Use: No      Allergies  Allergen Reactions  . Tape Other (See Comments)    Blisters on skin from tape during recent surgery.    Current Outpatient Prescriptions  Medication Sig Dispense Refill  . aspirin 81 MG tablet Take 81 mg by mouth daily.    . calcium carbonate (OS-CAL) 600 MG TABS tablet Take 600 mg by mouth daily.    . cholecalciferol (VITAMIN D) 1000 UNITS tablet Take 1,000 Units by mouth daily.    . hydrochlorothiazide  (HYDRODIURIL) 25 MG tablet     . lansoprazole (PREVACID) 15 MG capsule Take 15 mg by mouth every morning.     . lidocaine-prilocaine (EMLA) cream Apply 1 application topically as needed. 30 g 3  . loratadine (CLARITIN) 10 MG tablet Take 1 tablet (10 mg total) by mouth daily. 30 tablet 0  . meloxicam (MOBIC) 15 MG tablet 15 mg every morning.     . metoprolol succinate (TOPROL-XL) 50 MG 24 hr tablet Take 50 mg by mouth every morning. Take with or immediately following a meal.    . ondansetron (ZOFRAN) 8 MG tablet Take 1 tablet (8 mg total) by mouth 2 (two) times daily. Start the day after chemo for 2 days. Then take as needed for nausea or vomiting. 30 tablet 1  . Oxycodone HCl 10 MG TABS Take 1 tablet (10 mg total) by mouth every 6 (six) hours as needed. 30 tablet 0  .  PARoxetine (PAXIL) 40 MG tablet Take 40 mg by mouth at bedtime.     . simvastatin (ZOCOR) 40 MG tablet Take 40 mg by mouth at bedtime.     . traMADol (ULTRAM) 50 MG tablet Take by mouth every 6 (six) hours as needed.    . Multiple Vitamins-Minerals (MULTIVITAMIN) tablet Take 1 tablet by mouth daily.     No current facility-administered medications for this visit.   Facility-Administered Medications Ordered in Other Visits  Medication Dose Route Frequency Provider Last Rate Last Dose  . PACLitaxel (TAXOL) 198 mg in dextrose 5 % 250 mL chemo infusion (</= 15m/m2)  80 mg/m2 (Treatment Plan Actual) Intravenous Once JForest Gleason MD 283 mL/hr at 09/23/14 1214 198 mg at 09/23/14 1214  . sodium chloride 0.9 % 100 mL with potassium chloride 10 mEq infusion   Intravenous Continuous JForest Gleason MD 100 mL/hr at 09/23/14 1039      OBJECTIVE: PHYSICAL EXAM:   Filed Vitals:   09/23/14 0949  BP: 135/81  Pulse: 82  Temp: 96.4 F (35.8 C)     Body mass index is 50.91 kg/(m^2).    ECOG FS:0 - Asymptomatic  LAB RESULTS:  Infusion on 09/23/2014  Component Date Value Ref Range Status  . WBC 09/23/2014 3.7  3.6 - 11.0 K/uL Final    A-LINE DRAW  . RBC 09/23/2014 4.21  3.80 - 5.20 MIL/uL Final  . Hemoglobin 09/23/2014 13.0  12.0 - 16.0 g/dL Final  . HCT 09/23/2014 39.2  35.0 - 47.0 % Final  . MCV 09/23/2014 93.3  80.0 - 100.0 fL Final  . MCH 09/23/2014 31.0  26.0 - 34.0 pg Final  . MCHC 09/23/2014 33.3  32.0 - 36.0 g/dL Final  . RDW 09/23/2014 13.3  11.5 - 14.5 % Final  . Platelets 09/23/2014 230  150 - 440 K/uL Final  . Neutrophils Relative % 09/23/2014 42   Final  . Neutro Abs 09/23/2014 1.5  1.4 - 6.5 K/uL Final  . Lymphocytes Relative 09/23/2014 40   Final  . Lymphs Abs 09/23/2014 1.5  1.0 - 3.6 K/uL Final  . Monocytes Relative 09/23/2014 16   Final  . Monocytes Absolute 09/23/2014 0.6  0.2 - 0.9 K/uL Final  . Eosinophils Relative 09/23/2014 2   Final  . Eosinophils Absolute 09/23/2014 0.1  0 - 0.7 K/uL Final  . Basophils Relative 09/23/2014 0   Final  . Basophils Absolute 09/23/2014 0.0  0 - 0.1 K/uL Final  . Sodium 09/23/2014 138  135 - 145 mmol/L Final  . Potassium 09/23/2014 3.2* 3.5 - 5.1 mmol/L Final  . Chloride 09/23/2014 103  101 - 111 mmol/L Final  . CO2 09/23/2014 28  22 - 32 mmol/L Final  . Glucose, Bld 09/23/2014 106* 65 - 99 mg/dL Final  . BUN 09/23/2014 19  6 - 20 mg/dL Final  . Creatinine, Ser 09/23/2014 1.09* 0.44 - 1.00 mg/dL Final  . Calcium 09/23/2014 8.6* 8.9 - 10.3 mg/dL Final  . Total Protein 09/23/2014 6.3* 6.5 - 8.1 g/dL Final  . Albumin 09/23/2014 3.7  3.5 - 5.0 g/dL Final  . AST 09/23/2014 29  15 - 41 U/L Final  . ALT 09/23/2014 20  14 - 54 U/L Final  . Alkaline Phosphatase 09/23/2014 67  38 - 126 U/L Final  . Total Bilirubin 09/23/2014 1.0  0.3 - 1.2 mg/dL Final  . GFR calc non Af Amer 09/23/2014 49* >60 mL/min Final  . GFR calc Af Amer 09/23/2014 57* >60 mL/min Final  Comment: (NOTE) The eGFR has been calculated using the CKD EPI equation. This calculation has not been validated in all clinical situations. eGFR's persistently <60 mL/min signify possible Chronic  Kidney Disease.   . Anion gap 09/23/2014 7  5 - 15 Final     STUDIES: Dg Chest 1 View  08/27/2014   CLINICAL DATA:  Port-A-Cath insertion  EXAM: CHEST  1 VIEW  COMPARISON:  07/04/2006  FINDINGS: Left-sided Port-A-Cath with the tip located in the right atrium. There is no focal parenchymal opacity. There is no pleural effusion or pneumothorax. The heart and mediastinal contours are unremarkable.  The osseous structures are unremarkable.  IMPRESSION: Left-sided Port-A-Cath with the tip located in the right atrium.   Electronically Signed   By: Kathreen Devoid   On: 08/27/2014 14:02   Dg C-arm 1-60 Min-no Report  08/27/2014   CLINICAL DATA: catheter placement   C-ARM 1-60 MINUTES  Fluoroscopy was utilized by the requesting physician.  No radiographic  interpretation.     ASSESSMENT:  74 year old lady with a history of carcinoma of breast stage IB Sentinel lymph nodes negative Estrogen receptor progesterone receptor positive HER-2/neu receptor and overexpressed continue chemotherapy  Celexa and joint pain secondary to Taxol treatment     PLAN:  All lab data has been reviewed I had prolonged discussion with patient.  Considering an CCN guidelines possibility of weekly Taxol 12 and Herceptin for 12 months every 3 weeks apart needs to be t  Taxol and Herceptin has been started  Invasive ductal carcinoma of breast, stage 1   Staging form: Breast, AJCC 7th Edition     Clinical: Stage IA (T1b, N0, M0) - Signed by Forest Gleason, MD on 07/28/2014   Forest Gleason, MD   09/23/2014 1:13 PM

## 2014-09-23 NOTE — Progress Notes (Signed)
Patient does not have living will.  Never smoked. 

## 2014-09-30 ENCOUNTER — Inpatient Hospital Stay: Payer: Medicare HMO

## 2014-09-30 ENCOUNTER — Encounter: Payer: Self-pay | Admitting: Oncology

## 2014-09-30 ENCOUNTER — Inpatient Hospital Stay (HOSPITAL_BASED_OUTPATIENT_CLINIC_OR_DEPARTMENT_OTHER): Payer: Medicare HMO | Admitting: Oncology

## 2014-09-30 ENCOUNTER — Inpatient Hospital Stay: Payer: Medicare HMO | Attending: Oncology

## 2014-09-30 VITALS — BP 117/75 | HR 72 | Temp 96.1°F | Resp 18

## 2014-09-30 VITALS — BP 140/84 | HR 78 | Temp 95.6°F | Wt 297.6 lb

## 2014-09-30 DIAGNOSIS — F419 Anxiety disorder, unspecified: Secondary | ICD-10-CM | POA: Diagnosis not present

## 2014-09-30 DIAGNOSIS — C50911 Malignant neoplasm of unspecified site of right female breast: Secondary | ICD-10-CM | POA: Insufficient documentation

## 2014-09-30 DIAGNOSIS — E785 Hyperlipidemia, unspecified: Secondary | ICD-10-CM | POA: Insufficient documentation

## 2014-09-30 DIAGNOSIS — Z170 Estrogen receptor positive status [ER+]: Secondary | ICD-10-CM | POA: Insufficient documentation

## 2014-09-30 DIAGNOSIS — J329 Chronic sinusitis, unspecified: Secondary | ICD-10-CM | POA: Insufficient documentation

## 2014-09-30 DIAGNOSIS — Z79899 Other long term (current) drug therapy: Secondary | ICD-10-CM

## 2014-09-30 DIAGNOSIS — Z5111 Encounter for antineoplastic chemotherapy: Secondary | ICD-10-CM | POA: Insufficient documentation

## 2014-09-30 DIAGNOSIS — K219 Gastro-esophageal reflux disease without esophagitis: Secondary | ICD-10-CM | POA: Diagnosis not present

## 2014-09-30 DIAGNOSIS — I10 Essential (primary) hypertension: Secondary | ICD-10-CM | POA: Insufficient documentation

## 2014-09-30 LAB — CBC WITH DIFFERENTIAL/PLATELET
Basophils Absolute: 0.1 10*3/uL (ref 0–0.1)
Basophils Relative: 1 %
Eosinophils Absolute: 0.1 10*3/uL (ref 0–0.7)
Eosinophils Relative: 1 %
HEMATOCRIT: 38.3 % (ref 35.0–47.0)
Hemoglobin: 13.3 g/dL (ref 12.0–16.0)
LYMPHS ABS: 1.3 10*3/uL (ref 1.0–3.6)
Lymphocytes Relative: 27 %
MCH: 31.5 pg (ref 26.0–34.0)
MCHC: 34.7 g/dL (ref 32.0–36.0)
MCV: 90.6 fL (ref 80.0–100.0)
MONOS PCT: 4 %
Monocytes Absolute: 0.2 10*3/uL (ref 0.2–0.9)
NEUTROS ABS: 3.3 10*3/uL (ref 1.4–6.5)
NEUTROS PCT: 67 %
PLATELETS: 188 10*3/uL (ref 150–440)
RBC: 4.23 MIL/uL (ref 3.80–5.20)
RDW: 13 % (ref 11.5–14.5)
WBC: 4.9 10*3/uL (ref 3.6–11.0)

## 2014-09-30 LAB — BASIC METABOLIC PANEL
Anion gap: 7 (ref 5–15)
BUN: 19 mg/dL (ref 6–20)
CHLORIDE: 101 mmol/L (ref 101–111)
CO2: 28 mmol/L (ref 22–32)
Calcium: 8.6 mg/dL — ABNORMAL LOW (ref 8.9–10.3)
Creatinine, Ser: 1.08 mg/dL — ABNORMAL HIGH (ref 0.44–1.00)
GFR calc non Af Amer: 50 mL/min — ABNORMAL LOW (ref >60)
GFR, EST AFRICAN AMERICAN: 58 mL/min — AB (ref >60)
GLUCOSE: 138 mg/dL — AB (ref 65–99)
POTASSIUM: 3.4 mmol/L — AB (ref 3.5–5.1)
Sodium: 136 mmol/L (ref 135–145)

## 2014-09-30 MED ORDER — SODIUM CHLORIDE 0.9 % IV SOLN
Freq: Once | INTRAVENOUS | Status: AC
  Administered 2014-09-30: 10:00:00 via INTRAVENOUS
  Filled 2014-09-30: qty 1000

## 2014-09-30 MED ORDER — DIPHENHYDRAMINE HCL 50 MG/ML IJ SOLN
50.0000 mg | Freq: Once | INTRAMUSCULAR | Status: AC
  Administered 2014-09-30: 50 mg via INTRAVENOUS
  Filled 2014-09-30: qty 1

## 2014-09-30 MED ORDER — DIPHENHYDRAMINE HCL 25 MG PO CAPS: 50.0000 mg | ORAL_CAPSULE | Freq: Once | ORAL | Status: DC

## 2014-09-30 MED ORDER — HEPARIN SOD (PORK) LOCK FLUSH 100 UNIT/ML IV SOLN
500.0000 [IU] | Freq: Once | INTRAVENOUS | Status: AC
  Administered 2014-09-30: 500 [IU] via INTRAVENOUS
  Filled 2014-09-30: qty 5

## 2014-09-30 MED ORDER — SODIUM CHLORIDE 0.9 % IJ SOLN
10.0000 mL | INTRAMUSCULAR | Status: DC | PRN
  Administered 2014-09-30: 10 mL via INTRAVENOUS
  Filled 2014-09-30: qty 10

## 2014-09-30 MED ORDER — ACETAMINOPHEN 325 MG PO TABS
650.0000 mg | ORAL_TABLET | Freq: Once | ORAL | Status: AC
  Administered 2014-09-30: 650 mg via ORAL
  Filled 2014-09-30: qty 2

## 2014-09-30 MED ORDER — TRASTUZUMAB CHEMO INJECTION 440 MG
6.0000 mg/kg | Freq: Once | INTRAVENOUS | Status: AC
  Administered 2014-09-30: 798 mg via INTRAVENOUS
  Filled 2014-09-30: qty 38

## 2014-09-30 MED ORDER — SODIUM CHLORIDE 0.9 % IV SOLN
Freq: Once | INTRAVENOUS | Status: AC
  Administered 2014-09-30: 11:00:00 via INTRAVENOUS
  Filled 2014-09-30: qty 4

## 2014-09-30 MED ORDER — PACLITAXEL CHEMO INJECTION 300 MG/50ML
80.0000 mg/m2 | Freq: Once | INTRAVENOUS | Status: AC
  Administered 2014-09-30: 198 mg via INTRAVENOUS
  Filled 2014-09-30: qty 33

## 2014-09-30 MED ORDER — FAMOTIDINE IN NACL 20-0.9 MG/50ML-% IV SOLN
20.0000 mg | Freq: Once | INTRAVENOUS | Status: AC
Start: 2014-09-30 — End: 2014-09-30
  Administered 2014-09-30: 20 mg via INTRAVENOUS
  Filled 2014-09-30: qty 50

## 2014-09-30 NOTE — Progress Notes (Signed)
[place] @ [facility] Telephone:([phone]) [phone]  Fax:(336) [place]: Oct 22, 1940  MR#: [id number]  GYF#:[id number]  Patient Care Team: [name], MD as PCP - General (Family Medicine) [name], MD as Referring Physician (Family Medicine) Robert Bellow, MD (General Surgery)  CHIEF COMPLAINT:  Chief Complaint  Patient presents with  . Follow-up    VISIT DIAGNOSIS:   No diagnosis found.   Oncology History   1.  Carcinoma of right breast.  Status post lumpectomy and sentinel lymph node evaluation.  Tumor size 1 cm.  Estrogen and progesterone receptor positive.  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  Diagnosis in April of 2016 2.  Accelerated partial breast radiation in June of 2016 3.  Weekly Taxol and Herceptin therapy for adjuvant treatment starting from July of 2016     Invasive ductal carcinoma of breast, stage 1   06/17/2014 Initial Diagnosis Invasive ductal carcinoma of breast, stage 1    Oncology Flowsheet 07/14/2014 08/27/2014 09/05/2014 09/12/2014 09/23/2014  Day, Cycle - - Day 1, Cycle 1 Day 1, Cycle 2 Day 1, Cycle 3  dexamethasone (DECADRON) IV - - [ 20 mg ] [ 20 mg ] [ 10 mg ]  ondansetron (ZOFRAN) IJ - - - - -  ondansetron (ZOFRAN) IV - - [ 8 mg ] [ 8 mg ] [ 8 mg ]  PACLitaxel (TAXOL) IV - - 80 mg/m2 80 mg/m2 80 mg/m2  trastuzumab (HERCEPTIN) IV - - 8 mg/kg - -    INTERVAL HISTORY:  74 year old lady with a history of carcinoma breast on Taxol and Herceptin tolerating treatment very well.  No chills.  No fever.  No nausea.  No vomiting.  Has lost her hair. Patient is here for ongoing evaluation and treatment consideration.  Patient had some aches and pains in the muscle which is gradually getting better. . Taking Claritin tolerating treatment very well.  Appetite is improved.  No tingling.  No numbness.  Patient is 4 Herceptin next week REVIEW OF SYSTEMS:     general status: Alert and oriented and not any acute distress.  No change in  a performance status.  No chills.  No fever. HEENT: Alopecia.  No evidence of stomatitis Lungs: No cough or shortness of breath Cardiac: No chest pain or paroxysmal nocturnal dyspnea GI: No nausea no vomiting no diarrhea no abdominal pain Skin: No rash Lower extremity no swelling Neurological system: No tingling.  No numbness.  No other focal signs Musculoskeletal system no bony pains   PAST MEDICAL HISTORY: Past Medical History  Diagnosis Date  . Hyperlipidemia   . Hypertension   . GERD (gastroesophageal reflux disease)   . Arthritis   . Heart murmur   . Anxiety   . 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    PAST SURGICAL HISTORY: Past Surgical History  Procedure Laterality Date  . Abdominal hysterectomy    . Kneee surgery Left   . Cholecystectomy  2012  . Colonoscopy  2009  . Axillary lymph node biopsy Right 07/14/2014    Procedure: AXILLARY LYMPH NODE BIOPSY;  Surgeon: Robert Bellow, MD;  Location: [facility] ORS;  Service: General;  Laterality: Right;  . Breast biopsy Left 2015  . 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  . Eye surgery Bilateral 2014  .  Joint replacement Left 2001  . Joint replacement Right 2003  . Joint replacement Left 2004    replacement joint broken  . Portacath placement Left 08/27/2014    Procedure: INSERTION PORT-A-CATH;  Surgeon: Robert Bellow, MD;  Location: [facility] ORS;  Service: General;  Laterality: Left;    FAMILY HISTORY There is no significant family history of breast cancer, ovarian cancer, colon cancer       ADVANCED DIRECTIVES: Patient does not have any advanced healthcare directive. Information has been given.   HEALTH MAINTENANCE: History  Substance Use Topics  . Smoking status: Never Smoker   . Smokeless tobacco: Never Used  .  Alcohol Use: No      Allergies  Allergen Reactions  . Tape Other (See Comments)    Blisters on skin from tape during recent surgery.    Current Outpatient Prescriptions  Medication Sig Dispense Refill  . aspirin 81 MG tablet Take 81 mg by mouth daily.    . calcium carbonate (OS-CAL) 600 MG TABS tablet Take 600 mg by mouth daily.    . cholecalciferol (VITAMIN D) 1000 UNITS tablet Take 1,000 Units by mouth daily.    . hydrochlorothiazide (HYDRODIURIL) 25 MG tablet     . lansoprazole (PREVACID) 15 MG capsule Take 15 mg by mouth every morning.     . lidocaine-prilocaine (EMLA) cream Apply 1 application topically as needed. 30 g 3  . loratadine (CLARITIN) 10 MG tablet Take 1 tablet (10 mg total) by mouth daily. 30 tablet 0  . meloxicam (MOBIC) 15 MG tablet 15 mg every morning.     . metoprolol succinate (TOPROL-XL) 50 MG 24 hr tablet Take 50 mg by mouth every morning. Take with or immediately following a meal.    . Multiple Vitamins-Minerals (MULTIVITAMIN) tablet Take 1 tablet by mouth daily.    . ondansetron (ZOFRAN) 8 MG tablet Take 1 tablet (8 mg total) by mouth 2 (two) times daily. Start the day after chemo for 2 days. Then take as needed for nausea or vomiting. 30 tablet 1  . Oxycodone HCl 10 MG TABS Take 1 tablet (10 mg total) by mouth every 6 (six) hours as needed. 30 tablet 0  . PARoxetine (PAXIL) 40 MG tablet Take 40 mg by mouth at bedtime.     . simvastatin (ZOCOR) 40 MG tablet Take 40 mg by mouth at bedtime.     . traMADol (ULTRAM) 50 MG tablet Take by mouth every 6 (six) hours as needed.     No current facility-administered medications for this visit.   Facility-Administered Medications Ordered in Other Visits  Medication Dose Route Frequency Provider Last Rate Last Dose  . heparin lock flush 100 unit/mL  500 Units Intravenous Once Forest Gleason, MD      . sodium chloride 0.9 % injection 10 mL  10 mL Intravenous PRN Forest Gleason, MD        OBJECTIVE: PHYSICAL  EXAM: General  status: Performance status is good.  Patient has not lost significant weight HEENT: No evidence of stomatitis. Sclera and conjunctivae :: No jaundice.   pale looking. Lungs: Air  entry equal on both sides.  No rhonchi.  No rales.  Cardiac: Heart sounds are normal.  No pericardial rub.  No murmur. Lymphatic system: Cervical, axillary, inguinal, lymph nodes not palpable GI: Abdomen is soft.  No ascites.  Liver spleen not palpable.  No tenderness.  Bowel sounds are within normal limit Lower extremity: No edema Neurological system: Higher functions, cranial nerves intact  no evidence of peripheral neuropathy. Skin: No rash.  No ecchymosis.[name] within normal limit  Filed Vitals:   09/30/14 0906  BP: 140/84  Pulse: 78  Temp: 95.6 F (35.3 C)     Body mass index is 51.06 kg/(m^2).    ECOG FS:0 - Asymptomatic  LAB RESULTS:  Infusion on 09/30/2014  Component Date Value Ref Range Status  . WBC 09/30/2014 4.9  3.6 - 11.0 K/uL Final   A-LINE DRAW  . RBC 09/30/2014 4.23  3.80 - 5.20 MIL/uL Final  . Hemoglobin 09/30/2014 13.3  12.0 - 16.0 g/dL Final  . HCT 09/30/2014 38.3  35.0 - 47.0 % Final  . MCV 09/30/2014 90.6  80.0 - 100.0 fL Final  . MCH 09/30/2014 31.5  26.0 - 34.0 pg Final  . MCHC 09/30/2014 34.7  32.0 - 36.0 g/dL Final  . RDW 09/30/2014 13.0  11.5 - 14.5 % Final  . Platelets 09/30/2014 188  150 - 440 K/uL Final  . Neutrophils Relative % 09/30/2014 67   Final  . Neutro Abs 09/30/2014 3.3  1.4 - 6.5 K/uL Final  . Lymphocytes Relative 09/30/2014 27   Final  . Lymphs Abs 09/30/2014 1.3  1.0 - 3.6 K/uL Final  . Monocytes Relative 09/30/2014 4   Final  . Monocytes Absolute 09/30/2014 0.2  0.2 - 0.9 K/uL Final  . Eosinophils Relative 09/30/2014 1   Final  . Eosinophils Absolute 09/30/2014 0.1  0 - 0.7 K/uL Final  . Basophils Relative 09/30/2014 1   Final  . Basophils Absolute 09/30/2014 0.1  0 - 0.1 K/uL Final     STUDIES: No results found.  ASSESSMENT:   74 year old lady with a history of carcinoma of breast stage IB Sentinel lymph nodes negative Estrogen receptor progesterone receptor positive HER-2/neu receptor and overexpressed continue chemotherapy  Continue chemotherapy     PLAN:  All lab data has been reviewed Continue chemotherapy today and next week.  Staging form: Breast, AJCC 7th Edition     Clinical: Stage IA (T1b, N0, M0) - Signed by Forest Gleason, MD on 07/28/2014   Forest Gleason, MD   09/30/2014 9:19 AM

## 2014-09-30 NOTE — Progress Notes (Signed)
Patient does not have living will. Never smoked.  Patient complains of nose bleeds, mostly at night when lying down.  Also had diarrhea over the past week and took Immodium which resolved problem.

## 2014-10-05 ENCOUNTER — Other Ambulatory Visit: Payer: Self-pay | Admitting: Oncology

## 2014-10-07 ENCOUNTER — Ambulatory Visit: Payer: Medicare HMO

## 2014-10-07 ENCOUNTER — Other Ambulatory Visit: Payer: Medicare HMO

## 2014-10-07 ENCOUNTER — Inpatient Hospital Stay: Payer: Medicare HMO

## 2014-10-07 DIAGNOSIS — Z5111 Encounter for antineoplastic chemotherapy: Secondary | ICD-10-CM | POA: Diagnosis not present

## 2014-10-07 DIAGNOSIS — C50911 Malignant neoplasm of unspecified site of right female breast: Secondary | ICD-10-CM

## 2014-10-07 LAB — CBC WITH DIFFERENTIAL/PLATELET
Basophils Absolute: 0 10*3/uL (ref 0–0.1)
Basophils Relative: 1 %
Eosinophils Absolute: 0 10*3/uL (ref 0–0.7)
Eosinophils Relative: 2 %
HCT: 37.1 % (ref 35.0–47.0)
Hemoglobin: 12.8 g/dL (ref 12.0–16.0)
Lymphocytes Relative: 52 %
Lymphs Abs: 1 10*3/uL (ref 1.0–3.6)
MCH: 31.6 pg (ref 26.0–34.0)
MCHC: 34.6 g/dL (ref 32.0–36.0)
MCV: 91.5 fL (ref 80.0–100.0)
MONOS PCT: 7 %
Monocytes Absolute: 0.1 10*3/uL — ABNORMAL LOW (ref 0.2–0.9)
NEUTROS PCT: 38 %
Neutro Abs: 0.7 10*3/uL — ABNORMAL LOW (ref 1.4–6.5)
PLATELETS: 172 10*3/uL (ref 150–440)
RBC: 4.05 MIL/uL (ref 3.80–5.20)
RDW: 13.1 % (ref 11.5–14.5)
WBC: 2 10*3/uL — ABNORMAL LOW (ref 3.6–11.0)

## 2014-10-07 MED ORDER — HEPARIN SOD (PORK) LOCK FLUSH 100 UNIT/ML IV SOLN
500.0000 [IU] | Freq: Once | INTRAVENOUS | Status: AC
  Administered 2014-10-07: 500 [IU] via INTRAVENOUS
  Filled 2014-10-07: qty 5

## 2014-10-07 MED ORDER — SODIUM CHLORIDE 0.9 % IJ SOLN
10.0000 mL | INTRAMUSCULAR | Status: DC | PRN
  Administered 2014-10-07: 10 mL via INTRAVENOUS
  Filled 2014-10-07: qty 10

## 2014-10-09 ENCOUNTER — Telehealth: Payer: Self-pay | Admitting: *Deleted

## 2014-10-09 MED ORDER — LEVOFLOXACIN 500 MG PO TABS: 500.0000 mg | ORAL_TABLET | Freq: Every day | ORAL | Status: DC

## 2014-10-09 NOTE — Telephone Encounter (Signed)
Reports that she has NO energy at all, and that her nose has been bleeding. She is using Saline nasal spray. Reports she has no fever. She reports that her chemo this week was not given due to low blood counts

## 2014-10-09 NOTE — Telephone Encounter (Signed)
Pt informed that rx was sent in to [facility] for abx She said thank you very much

## 2014-10-14 ENCOUNTER — Inpatient Hospital Stay: Payer: Medicare HMO

## 2014-10-14 ENCOUNTER — Encounter: Payer: Self-pay | Admitting: Oncology

## 2014-10-14 ENCOUNTER — Inpatient Hospital Stay (HOSPITAL_BASED_OUTPATIENT_CLINIC_OR_DEPARTMENT_OTHER): Payer: Medicare HMO | Admitting: Oncology

## 2014-10-14 VITALS — BP 137/84 | HR 76 | Temp 96.7°F | Wt 296.2 lb

## 2014-10-14 DIAGNOSIS — Z79899 Other long term (current) drug therapy: Secondary | ICD-10-CM | POA: Diagnosis not present

## 2014-10-14 DIAGNOSIS — J329 Chronic sinusitis, unspecified: Secondary | ICD-10-CM | POA: Diagnosis not present

## 2014-10-14 DIAGNOSIS — C50911 Malignant neoplasm of unspecified site of right female breast: Secondary | ICD-10-CM

## 2014-10-14 DIAGNOSIS — Z170 Estrogen receptor positive status [ER+]: Secondary | ICD-10-CM

## 2014-10-14 DIAGNOSIS — Z5111 Encounter for antineoplastic chemotherapy: Secondary | ICD-10-CM | POA: Diagnosis not present

## 2014-10-14 LAB — COMPREHENSIVE METABOLIC PANEL
ALK PHOS: 72 U/L (ref 38–126)
ALT: 18 U/L (ref 14–54)
ANION GAP: 8 (ref 5–15)
AST: 28 U/L (ref 15–41)
Albumin: 3.5 g/dL (ref 3.5–5.0)
BILIRUBIN TOTAL: 0.8 mg/dL (ref 0.3–1.2)
BUN: 22 mg/dL — ABNORMAL HIGH (ref 6–20)
CALCIUM: 8.7 mg/dL — AB (ref 8.9–10.3)
CO2: 26 mmol/L (ref 22–32)
Chloride: 103 mmol/L (ref 101–111)
Creatinine, Ser: 1.22 mg/dL — ABNORMAL HIGH (ref 0.44–1.00)
GFR calc Af Amer: 50 mL/min — ABNORMAL LOW (ref >60)
GFR, EST NON AFRICAN AMERICAN: 43 mL/min — AB (ref >60)
Glucose, Bld: 115 mg/dL — ABNORMAL HIGH (ref 65–99)
POTASSIUM: 3.2 mmol/L — AB (ref 3.5–5.1)
Sodium: 137 mmol/L (ref 135–145)
TOTAL PROTEIN: 6.6 g/dL (ref 6.5–8.1)

## 2014-10-14 LAB — CBC WITH DIFFERENTIAL/PLATELET
Basophils Absolute: 0.1 10*3/uL (ref 0–0.1)
Basophils Relative: 1 %
Eosinophils Absolute: 0.1 10*3/uL (ref 0–0.7)
Eosinophils Relative: 1 %
HEMATOCRIT: 37.1 % (ref 35.0–47.0)
Hemoglobin: 12.7 g/dL (ref 12.0–16.0)
LYMPHS ABS: 1.9 10*3/uL (ref 1.0–3.6)
LYMPHS PCT: 31 %
MCH: 31.1 pg (ref 26.0–34.0)
MCHC: 34.2 g/dL (ref 32.0–36.0)
MCV: 90.8 fL (ref 80.0–100.0)
MONO ABS: 1.1 10*3/uL — AB (ref 0.2–0.9)
MONOS PCT: 18 %
NEUTROS ABS: 3 10*3/uL (ref 1.4–6.5)
Neutrophils Relative %: 49 %
Platelets: 201 10*3/uL (ref 150–440)
RBC: 4.08 MIL/uL (ref 3.80–5.20)
RDW: 13.5 % (ref 11.5–14.5)
WBC: 6.1 10*3/uL (ref 3.6–11.0)

## 2014-10-14 MED ORDER — FLUTICASONE PROPIONATE 50 MCG/ACT NA SUSP: 1.0000 | Freq: Every day | NASAL | Status: DC

## 2014-10-14 MED ORDER — LEVOFLOXACIN 500 MG PO TABS: 500.0000 mg | ORAL_TABLET | Freq: Every day | ORAL | Status: DC

## 2014-10-14 MED ORDER — HEPARIN SOD (PORK) LOCK FLUSH 100 UNIT/ML IV SOLN
500.0000 [IU] | Freq: Once | INTRAVENOUS | Status: AC
  Administered 2014-10-14: 500 [IU] via INTRAVENOUS

## 2014-10-14 MED ORDER — HEPARIN SOD (PORK) LOCK FLUSH 100 UNIT/ML IV SOLN
INTRAVENOUS | Status: AC
  Filled 2014-10-14: qty 5

## 2014-10-14 NOTE — Progress Notes (Signed)
Patient does not have living will.  Never smoked. Patient complains of continued nose bleeds.  She states the inside of her nares are sore.  She is SOB and stays fatigued.  She gets up and does a little bit then has to sit down and rest before proceeding with another task.  Husband is concerned about her eyes being dark around them.

## 2014-10-14 NOTE — Progress Notes (Signed)
[place] @ [facility] [phone]  Fax:(336) [place]: [birth year]  MR#: [id number]  OFB#:[id number]  Patient Care Team: [name], MD as PCP - General (Family Medicine) [name], MD as Referring Physician (Family Medicine) Robert Bellow, MD (General Surgery)  CHIEF COMPLAINT:  Chief Complaint  Patient presents with  . Follow-up    VISIT DIAGNOSIS:     ICD-9-CM ICD-10-CM   1. Invasive ductal carcinoma of breast, stage 1, right 174.9 C50.911 levofloxacin (LEVAQUIN) 500 MG tablet     fluticasone (FLONASE) 50 MCG/ACT nasal spray     CBC with Differential     Comprehensive metabolic panel     Oncology History   1.  Carcinoma of right breast.  Status post lumpectomy and sentinel lymph node evaluation.  Tumor size 1 cm.  Estrogen and progesterone receptor positive.  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  Diagnosis in April of 2016 2.  Accelerated partial breast radiation in June of 2016 3.  Weekly Taxol and Herceptin therapy for adjuvant treatment starting from July of 2016     Invasive ductal carcinoma of breast, stage 1   06/17/2014 Initial Diagnosis Invasive ductal carcinoma of breast, stage 1    Oncology Flowsheet 07/14/2014 08/27/2014 09/05/2014 09/12/2014 09/23/2014 09/30/2014  Day, Cycle - - Day 1, Cycle 1 Day 1, Cycle 2 Day 1, Cycle 3 Day 1, Cycle 4  dexamethasone (DECADRON) IV - - [ 20 mg ] [ 20 mg ] [ 10 mg ] [ 10 mg ]  ondansetron (ZOFRAN) IJ - - - - - -  ondansetron (ZOFRAN) IV - - [ 8 mg ] [ 8 mg ] [ 8 mg ] [ 8 mg ]  PACLitaxel (TAXOL) IV - - 80 mg/m2 80 mg/m2 80 mg/m2 80 mg/m2  trastuzumab (HERCEPTIN) IV - - 8 mg/kg - - 6 mg/kg    INTERVAL HISTORY:  74 year old lady with a history of carcinoma breast on Taxol and Herceptin tolerating treatment very well.  No chills.  No fever.  No nausea.  No vomiting.  Has lost her hair. Patient is here for ongoing evaluation and treatment consideration.  Patient had some aches and pains  in the muscle which is gradually getting better. . Taking Claritin tolerating treatment very well.  Appetite is improved.  No tingling.  No numbness.   Patient chemotherapy was deferred last week because of absolute neutrophil count was below 1000.  Patient also had sinus infection.  Bleeding from the nose.  Patient was given Levaquin and Flonase which helped but still still feel like congested.  No chills.  No fever. Feels like has multiple ulcers in the nose. REVIEW OF SYSTEMS:     general status: Alert and oriented and not any acute distress.  No change in a performance status.  No chills.  No fever. HEENT: Continues to have congested nose and congested sinuses.  No fever.  Occasional epistaxis. Lungs: No cough or shortness of breath Cardiac: No chest pain or paroxysmal nocturnal dyspnea GI: No nausea no vomiting no diarrhea no abdominal pain Skin: No rash Lower extremity no swelling Neurological system: No tingling.  No numbness.  No other focal signs Musculoskeletal system no bony pains   PAST MEDICAL HISTORY: Past Medical History  Diagnosis Date  . Hyperlipidemia   . Hypertension   . GERD (gastroesophageal reflux disease)   . Arthritis   . Heart murmur   . Anxiety   . Breast cancer of upper-outer quadrant of right  female breast 06/10/2014    T1b,Nx; ER 90%, PR 50-90%, HER-2/neu 2+, FISH positive.    PAST SURGICAL HISTORY: Past Surgical History  Procedure Laterality Date  . Abdominal hysterectomy    . Kneee surgery Left   . Cholecystectomy  2012  . Colonoscopy  2009  . Axillary lymph node biopsy Right 07/14/2014    Procedure: AXILLARY LYMPH NODE BIOPSY;  Surgeon: Robert Bellow, MD;  Location: [facility] ORS;  Service: General;  Laterality: Right;  . Breast biopsy Left 2015  . 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  . Eye surgery Bilateral 2014  . Joint replacement  Left 2001  . Joint replacement Right 2003  . Joint replacement Left 2004    replacement joint broken  . Portacath placement Left 08/27/2014    Procedure: INSERTION PORT-A-CATH;  Surgeon: Robert Bellow, MD;  Location: [facility] ORS;  Service: General;  Laterality: Left;    FAMILY HISTORY There is no significant family history of breast cancer, ovarian cancer, colon cancer       ADVANCED DIRECTIVES: Patient does not have any advanced healthcare directive. Information has been given.   HEALTH MAINTENANCE: Social History  Substance Use Topics  . Smoking status: Never Smoker   . Smokeless tobacco: Never Used  . Alcohol Use: No      Allergies  Allergen Reactions  . Tape Other (See Comments)    Blisters on skin from tape during recent surgery.    Current Outpatient Prescriptions  Medication Sig Dispense Refill  . aspirin 81 MG tablet Take 81 mg by mouth daily.    . calcium carbonate (OS-CAL) 600 MG TABS tablet Take 600 mg by mouth daily.    . cholecalciferol (VITAMIN D) 1000 UNITS tablet Take 1,000 Units by mouth daily.    . hydrochlorothiazide (HYDRODIURIL) 25 MG tablet     . lansoprazole (PREVACID) 15 MG capsule Take 15 mg by mouth every morning.     [name] levofloxacin (LEVAQUIN) 500 MG tablet Take 1 tablet (500 mg total) by mouth daily. 5 tablet 0  . lidocaine-prilocaine (EMLA) cream Apply 1 application topically as needed. 30 g 3  . loratadine (CLARITIN) 10 MG tablet TAKE ONE TABLET BY MOUTH ONCE DAILY 30 tablet 0  . meloxicam (MOBIC) 15 MG tablet 15 mg every morning.     . metoprolol succinate (TOPROL-XL) 50 MG 24 hr tablet Take 50 mg by mouth every morning. Take with or immediately following a meal.    . Multiple Vitamins-Minerals (MULTIVITAMIN) tablet Take 1 tablet by mouth daily.    . ondansetron (ZOFRAN) 8 MG tablet Take 1 tablet (8 mg total) by mouth 2 (two) times daily. Start the day after chemo for 2 days. Then take as needed for nausea or vomiting. 30 tablet 1  .  Oxycodone HCl 10 MG TABS Take 1 tablet (10 mg total) by mouth every 6 (six) hours as needed. 30 tablet 0  . PARoxetine (PAXIL) 40 MG tablet Take 40 mg by mouth at bedtime.     . simvastatin (ZOCOR) 40 MG tablet Take 40 mg by mouth at bedtime.     . traMADol (ULTRAM) 50 MG tablet Take by mouth every 6 (six) hours as needed.    . fluticasone (FLONASE) 50 MCG/ACT nasal spray Place 1 spray into both nostrils daily. 16 g 2   No current facility-administered medications for this visit.    OBJECTIVE:  PHYSICAL EXAM: General  status: Performance status is good.  Patient has not lost significant weight HEENT: No evidence of stomatitis. Sclera and conjunctivae :: No jaundice.   pale looking. Evaluation of the nose shows no evidence of ulceration but redness.  Bleeding. Lungs: Air  entry equal on both sides.  No rhonchi.  No rales.  Cardiac: Heart sounds are normal.  No pericardial rub.  No murmur. Lymphatic system: Cervical, axillary, inguinal, lymph nodes not palpable GI: Abdomen is soft.  No ascites.  Liver spleen not palpable.  No tenderness.  Bowel sounds are within normal limit Lower extremity: No edema Neurological system: Higher functions, cranial nerves intact no evidence of peripheral neuropathy. Skin: No rash.  No ecchymosis.[name] Port Site within normal limit  Filed Vitals:   10/14/14 0859  BP: 137/84  Pulse: 76  Temp: 96.7 F (35.9 C)     Body mass index is 50.83 kg/(m^2).    ECOG FS:0 - Asymptomatic  LAB RESULTS:  Infusion on 10/14/2014  Component Date Value Ref Range Status  . WBC 10/14/2014 6.1  3.6 - 11.0 K/uL Final   A-LINE DRAW  . RBC 10/14/2014 4.08  3.80 - 5.20 MIL/uL Final  . Hemoglobin 10/14/2014 12.7  12.0 - 16.0 g/dL Final  . HCT 10/14/2014 37.1  35.0 - 47.0 % Final  . MCV 10/14/2014 90.8  80.0 - 100.0 fL Final  . MCH 10/14/2014 31.1  26.0 - 34.0 pg Final  . MCHC 10/14/2014 34.2  32.0 - 36.0 g/dL Final  . RDW 10/14/2014 13.5  11.5 - 14.5 % Final  . Platelets  10/14/2014 201  150 - 440 K/uL Final  . Neutrophils Relative % 10/14/2014 49   Final  . Neutro Abs 10/14/2014 3.0  1.4 - 6.5 K/uL Final  . Lymphocytes Relative 10/14/2014 31   Final  . Lymphs Abs 10/14/2014 1.9  1.0 - 3.6 K/uL Final  . Monocytes Relative 10/14/2014 18   Final  . Monocytes Absolute 10/14/2014 1.1* 0.2 - 0.9 K/uL Final  . Eosinophils Relative 10/14/2014 1   Final  . Eosinophils Absolute 10/14/2014 0.1  0 - 0.7 K/uL Final  . Basophils Relative 10/14/2014 1   Final  . Basophils Absolute 10/14/2014 0.1  0 - 0.1 K/uL Final  . Sodium 10/14/2014 137  135 - 145 mmol/L Final  . Potassium 10/14/2014 3.2* 3.5 - 5.1 mmol/L Final  . Chloride 10/14/2014 103  101 - 111 mmol/L Final  . CO2 10/14/2014 26  22 - 32 mmol/L Final  . Glucose, Bld 10/14/2014 115* 65 - 99 mg/dL Final  . BUN 10/14/2014 22* 6 - 20 mg/dL Final  . Creatinine, Ser 10/14/2014 1.22* 0.44 - 1.00 mg/dL Final  . Calcium 10/14/2014 8.7* 8.9 - 10.3 mg/dL Final  . Total Protein 10/14/2014 6.6  6.5 - 8.1 g/dL Final  . Albumin 10/14/2014 3.5  3.5 - 5.0 g/dL Final  . AST 10/14/2014 28  15 - 41 U/L Final  . ALT 10/14/2014 18  14 - 54 U/L Final  . Alkaline Phosphatase 10/14/2014 72  38 - 126 U/L Final  . Total Bilirubin 10/14/2014 0.8  0.3 - 1.2 mg/dL Final  . GFR calc non Af Amer 10/14/2014 43* >60 mL/min Final  . GFR calc Af Amer 10/14/2014 50* >60 mL/min Final   Comment: (NOTE) The eGFR has been calculated using the CKD EPI equation. This calculation has not been validated in all clinical situations. eGFR's persistently <60 mL/min signify possible Chronic Kidney Disease.   [name]  Anion gap 10/14/2014 8  5 - 15 Final      ASSESSMENT:  74 year old lady with a history of carcinoma of breast stage IB Sentinel lymph nodes negative Estrogen receptor progesterone receptor positive HER-2/neu receptor and overexpressed continue chemotherapy  Remo therapy was deferred last week because of low blood count is being deferred this  week because of persistent sinus infection.  We will continue Levaquin Flonase reevaluate patient for chemotherapy in 2 weeks.  In between the next week if patient's condition improves then patient will proceed with Taxol chemotherapy blood count permits.  I would evaluate this patient in 2 weeks and will start patient back on new cycle with Taxol and Herceptin.    PLAN:  All lab data has been reviewed Continue chemotherapy today and next week.  Staging form: Breast, AJCC 7th Edition     Clinical: Stage IA (T1b, N0, M0) - Signed by Forest Gleason, MD on 07/28/2014   Forest Gleason, MD   10/14/2014 9:45 AM

## 2014-10-21 ENCOUNTER — Inpatient Hospital Stay: Payer: Medicare HMO

## 2014-10-21 VITALS — BP 102/66 | HR 62 | Temp 97.0°F

## 2014-10-21 DIAGNOSIS — C50911 Malignant neoplasm of unspecified site of right female breast: Secondary | ICD-10-CM

## 2014-10-21 DIAGNOSIS — Z5111 Encounter for antineoplastic chemotherapy: Secondary | ICD-10-CM | POA: Diagnosis not present

## 2014-10-21 LAB — CBC WITH DIFFERENTIAL/PLATELET
BASOS ABS: 0.1 10*3/uL (ref 0–0.1)
Basophils Relative: 1 %
Eosinophils Absolute: 0.1 10*3/uL (ref 0–0.7)
Eosinophils Relative: 1 %
HCT: 36.8 % (ref 35.0–47.0)
HEMOGLOBIN: 12.5 g/dL (ref 12.0–16.0)
LYMPHS ABS: 1.8 10*3/uL (ref 1.0–3.6)
LYMPHS PCT: 19 %
MCH: 31.3 pg (ref 26.0–34.0)
MCHC: 33.9 g/dL (ref 32.0–36.0)
MCV: 92.4 fL (ref 80.0–100.0)
Monocytes Absolute: 0.8 10*3/uL (ref 0.2–0.9)
Monocytes Relative: 8 %
NEUTROS ABS: 7 10*3/uL — AB (ref 1.4–6.5)
NEUTROS PCT: 71 %
Platelets: 173 10*3/uL (ref 150–440)
RBC: 3.98 MIL/uL (ref 3.80–5.20)
RDW: 13.9 % (ref 11.5–14.5)
WBC: 9.8 10*3/uL (ref 3.6–11.0)

## 2014-10-21 LAB — COMPREHENSIVE METABOLIC PANEL
ALK PHOS: 72 U/L (ref 38–126)
ALT: 18 U/L (ref 14–54)
AST: 30 U/L (ref 15–41)
Albumin: 3.5 g/dL (ref 3.5–5.0)
Anion gap: 5 (ref 5–15)
BUN: 19 mg/dL (ref 6–20)
CALCIUM: 8.6 mg/dL — AB (ref 8.9–10.3)
CO2: 28 mmol/L (ref 22–32)
CREATININE: 1.14 mg/dL — AB (ref 0.44–1.00)
Chloride: 105 mmol/L (ref 101–111)
GFR, EST AFRICAN AMERICAN: 54 mL/min — AB (ref >60)
GFR, EST NON AFRICAN AMERICAN: 47 mL/min — AB (ref >60)
Glucose, Bld: 125 mg/dL — ABNORMAL HIGH (ref 65–99)
Potassium: 3.5 mmol/L (ref 3.5–5.1)
Sodium: 138 mmol/L (ref 135–145)
Total Bilirubin: 0.7 mg/dL (ref 0.3–1.2)
Total Protein: 6.4 g/dL — ABNORMAL LOW (ref 6.5–8.1)

## 2014-10-21 MED ORDER — DIPHENHYDRAMINE HCL 50 MG/ML IJ SOLN
50.0000 mg | Freq: Once | INTRAMUSCULAR | Status: AC
  Administered 2014-10-21: 50 mg via INTRAVENOUS
  Filled 2014-10-21: qty 1

## 2014-10-21 MED ORDER — PACLITAXEL CHEMO INJECTION 300 MG/50ML
80.0000 mg/m2 | Freq: Once | INTRAVENOUS | Status: AC
  Administered 2014-10-21: 198 mg via INTRAVENOUS
  Filled 2014-10-21: qty 33

## 2014-10-21 MED ORDER — FAMOTIDINE IN NACL 20-0.9 MG/50ML-% IV SOLN
20.0000 mg | Freq: Once | INTRAVENOUS | Status: AC
  Administered 2014-10-21: 20 mg via INTRAVENOUS
  Filled 2014-10-21: qty 50

## 2014-10-21 MED ORDER — SODIUM CHLORIDE 0.9 % IV SOLN
Freq: Once | INTRAVENOUS | Status: AC
  Administered 2014-10-21: 11:00:00 via INTRAVENOUS
  Filled 2014-10-21: qty 4

## 2014-10-21 MED ORDER — HEPARIN SOD (PORK) LOCK FLUSH 100 UNIT/ML IV SOLN
500.0000 [IU] | Freq: Once | INTRAVENOUS | Status: AC | PRN
  Administered 2014-10-21: 500 [IU]
  Filled 2014-10-21 (×2): qty 5

## 2014-10-21 MED ORDER — SODIUM CHLORIDE 0.9 % IJ SOLN
10.0000 mL | INTRAMUSCULAR | Status: DC | PRN
  Administered 2014-10-21: 10 mL
  Filled 2014-10-21: qty 10

## 2014-10-21 MED ORDER — SODIUM CHLORIDE 0.9 % IV SOLN
Freq: Once | INTRAVENOUS | Status: AC
Start: 2014-10-21 — End: 2014-10-21
  Administered 2014-10-21: 10:00:00 via INTRAVENOUS
  Filled 2014-10-21: qty 1000

## 2014-10-24 ENCOUNTER — Other Ambulatory Visit: Payer: Self-pay | Admitting: *Deleted

## 2014-10-24 DIAGNOSIS — C50911 Malignant neoplasm of unspecified site of right female breast: Secondary | ICD-10-CM

## 2014-10-28 ENCOUNTER — Inpatient Hospital Stay: Payer: Medicare HMO

## 2014-10-28 ENCOUNTER — Inpatient Hospital Stay (HOSPITAL_BASED_OUTPATIENT_CLINIC_OR_DEPARTMENT_OTHER): Payer: Medicare HMO | Admitting: Oncology

## 2014-10-28 ENCOUNTER — Encounter: Payer: Self-pay | Admitting: Oncology

## 2014-10-28 ENCOUNTER — Encounter: Payer: Self-pay | Admitting: *Deleted

## 2014-10-28 VITALS — BP 121/77 | HR 71

## 2014-10-28 VITALS — BP 144/91 | HR 71 | Temp 96.1°F | Wt 297.0 lb

## 2014-10-28 DIAGNOSIS — Z79899 Other long term (current) drug therapy: Secondary | ICD-10-CM

## 2014-10-28 DIAGNOSIS — C50911 Malignant neoplasm of unspecified site of right female breast: Secondary | ICD-10-CM | POA: Diagnosis not present

## 2014-10-28 DIAGNOSIS — Z170 Estrogen receptor positive status [ER+]: Secondary | ICD-10-CM

## 2014-10-28 DIAGNOSIS — Z5111 Encounter for antineoplastic chemotherapy: Secondary | ICD-10-CM | POA: Diagnosis not present

## 2014-10-28 LAB — COMPREHENSIVE METABOLIC PANEL
ALBUMIN: 3.5 g/dL (ref 3.5–5.0)
ALT: 19 U/L (ref 14–54)
ANION GAP: 5 (ref 5–15)
AST: 29 U/L (ref 15–41)
Alkaline Phosphatase: 66 U/L (ref 38–126)
BUN: 25 mg/dL — ABNORMAL HIGH (ref 6–20)
CO2: 26 mmol/L (ref 22–32)
Calcium: 8.4 mg/dL — ABNORMAL LOW (ref 8.9–10.3)
Chloride: 103 mmol/L (ref 101–111)
Creatinine, Ser: 1.02 mg/dL — ABNORMAL HIGH (ref 0.44–1.00)
GFR calc non Af Amer: 53 mL/min — ABNORMAL LOW (ref >60)
GLUCOSE: 125 mg/dL — AB (ref 65–99)
POTASSIUM: 3.2 mmol/L — AB (ref 3.5–5.1)
SODIUM: 134 mmol/L — AB (ref 135–145)
TOTAL PROTEIN: 6 g/dL — AB (ref 6.5–8.1)
Total Bilirubin: 1.5 mg/dL — ABNORMAL HIGH (ref 0.3–1.2)

## 2014-10-28 LAB — CBC WITH DIFFERENTIAL/PLATELET
BASOS PCT: 1 %
Basophils Absolute: 0 10*3/uL (ref 0–0.1)
EOS ABS: 0.1 10*3/uL (ref 0–0.7)
EOS PCT: 2 %
HCT: 34.9 % — ABNORMAL LOW (ref 35.0–47.0)
Hemoglobin: 11.9 g/dL — ABNORMAL LOW (ref 12.0–16.0)
Lymphocytes Relative: 29 %
Lymphs Abs: 1.2 10*3/uL (ref 1.0–3.6)
MCH: 31.2 pg (ref 26.0–34.0)
MCHC: 34.1 g/dL (ref 32.0–36.0)
MCV: 91.5 fL (ref 80.0–100.0)
MONO ABS: 0.2 10*3/uL (ref 0.2–0.9)
MONOS PCT: 5 %
Neutro Abs: 2.6 10*3/uL (ref 1.4–6.5)
Neutrophils Relative %: 63 %
Platelets: 139 10*3/uL — ABNORMAL LOW (ref 150–440)
RBC: 3.82 MIL/uL (ref 3.80–5.20)
RDW: 14.5 % (ref 11.5–14.5)
WBC: 4 10*3/uL (ref 3.6–11.0)

## 2014-10-28 MED ORDER — ACETAMINOPHEN 325 MG PO TABS
650.0000 mg | ORAL_TABLET | Freq: Once | ORAL | Status: AC
  Administered 2014-10-28: 650 mg via ORAL
  Filled 2014-10-28: qty 2

## 2014-10-28 MED ORDER — SODIUM CHLORIDE 0.9 % IV SOLN
Freq: Once | INTRAVENOUS | Status: AC
  Administered 2014-10-28: 10:00:00 via INTRAVENOUS
  Filled 2014-10-28: qty 4

## 2014-10-28 MED ORDER — PACLITAXEL CHEMO INJECTION 300 MG/50ML
80.0000 mg/m2 | Freq: Once | INTRAVENOUS | Status: AC
  Administered 2014-10-28: 198 mg via INTRAVENOUS
  Filled 2014-10-28: qty 33

## 2014-10-28 MED ORDER — SODIUM CHLORIDE 0.9 % IJ SOLN
10.0000 mL | INTRAMUSCULAR | Status: DC | PRN
  Filled 2014-10-28: qty 10

## 2014-10-28 MED ORDER — FAMOTIDINE IN NACL 20-0.9 MG/50ML-% IV SOLN
20.0000 mg | Freq: Once | INTRAVENOUS | Status: AC
  Administered 2014-10-28: 20 mg via INTRAVENOUS
  Filled 2014-10-28: qty 50

## 2014-10-28 MED ORDER — SODIUM CHLORIDE 0.9 % IJ SOLN
10.0000 mL | INTRAMUSCULAR | Status: DC | PRN
  Administered 2014-10-28 (×2): 10 mL
  Filled 2014-10-28: qty 10

## 2014-10-28 MED ORDER — SODIUM CHLORIDE 0.9 % IV SOLN
6.0000 mg/kg | Freq: Once | INTRAVENOUS | Status: AC
  Administered 2014-10-28: 798 mg via INTRAVENOUS
  Filled 2014-10-28: qty 38

## 2014-10-28 MED ORDER — DIPHENHYDRAMINE HCL 50 MG/ML IJ SOLN
6.2500 mg | Freq: Once | INTRAMUSCULAR | Status: AC
  Administered 2014-10-28: 6.5 mg via INTRAVENOUS
  Filled 2014-10-28: qty 1

## 2014-10-28 MED ORDER — HEPARIN SOD (PORK) LOCK FLUSH 100 UNIT/ML IV SOLN
500.0000 [IU] | Freq: Once | INTRAVENOUS | Status: AC
  Administered 2014-10-28: 500 [IU] via INTRAVENOUS

## 2014-10-28 MED ORDER — HEPARIN SOD (PORK) LOCK FLUSH 100 UNIT/ML IV SOLN
500.0000 [IU] | Freq: Once | INTRAVENOUS | Status: DC | PRN
  Filled 2014-10-28: qty 5

## 2014-10-28 MED ORDER — SODIUM CHLORIDE 0.9 % IV SOLN
Freq: Once | INTRAVENOUS | Status: AC
  Administered 2014-10-28: 09:00:00 via INTRAVENOUS
  Filled 2014-10-28: qty 1000

## 2014-10-28 NOTE — Progress Notes (Signed)
[name] @ [facility] Telephone:(336) [phone]  Fax:(336) [place]: Jun 28, 1940  MR#: [id number]  BJS#:[id number]  Patient Care Team: [name], MD as PCP - General (Family Medicine) [name], MD as Referring Physician (Family Medicine) Robert Bellow, MD (General Surgery)  CHIEF COMPLAINT:  Chief Complaint  Patient presents with  . Follow-up   Oncology History   1.  Carcinoma of right breast.  Status post lumpectomy and sentinel lymph node evaluation.  Tumor size 1 cm.  Estrogen and progesterone receptor positive.  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  Diagnosis in April of 2016 2.  Accelerated partial breast radiation in June of 2016 3.  Weekly Taxol and Herceptin therapy for adjuvant treatment starting from July of 2016      VISIT DIAGNOSIS:   No diagnosis found.    Oncology Flowsheet 08/27/2014 09/05/2014 09/12/2014 09/23/2014 09/30/2014 10/21/2014 10/28/2014  Day, Cycle - Day 1, Cycle 1 Day 1, Cycle 2 Day 1, Cycle 3 Day 1, Cycle 4 Day 1, Cycle 6 Day 1, Cycle 7  dexamethasone (DECADRON) IV - [ 20 mg ] [ 20 mg ] [ 10 mg ] [ 10 mg ] [ 10 mg ] [ 6 mg ]  ondansetron (ZOFRAN) IJ - - - - - - -  ondansetron (ZOFRAN) IV - [ 8 mg ] [ 8 mg ] [ 8 mg ] [ 8 mg ] [ 8 mg ] [ 8 mg ]  PACLitaxel (TAXOL) IV - 80 mg/m2 80 mg/m2 80 mg/m2 80 mg/m2 80 mg/m2 80 mg/m2  trastuzumab (HERCEPTIN) IV - 8 mg/kg - - 6 mg/kg - 6 mg/kg    INTERVAL HISTORY:  74 year old lady with a history of carcinoma breast on Taxol and Herceptin tolerating treatment very well.  No chills.  No fever.  No nausea.  No vomiting.  Has lost her hair. Patient is here for ongoing evaluation and treatment consideration.  Patient had some aches and pains in the muscle which is gradually getting better. . Taking Claritin tolerating treatment very well.  Appetite is improved.  No tingling.  No numbness.   Patient chemotherapy was deferred last week because of absolute neutrophil count was below 1000.   Patient also had sinus infection.  Bleeding from the nose.  Patient was given Levaquin and Flonase which helped but still still feel like congested.  No chills.  No fever. Feels like has multiple ulcers in the nose. October 28, 2014 Patient is for the next cycle of Taxol chemotherapy.  She had some mild bleeding from the nose has been evaluated by ENT physician.  No chills.  No fever.  No tingling numbness.  No nausea no vomiting.  No rash For continuation of chemotherapy with Taxol and Herceptin REVIEW OF SYSTEMS:     general status: Alert and oriented and not any acute distress.  No change in a performance status.  No chills.  No fever. HEENT: Continues to have congested nose and congested sinuses.  No fever.  Occasional epistaxis. Lungs: No cough or shortness of breath Cardiac: No chest pain or paroxysmal nocturnal dyspnea GI: No nausea no vomiting no diarrhea no abdominal pain Skin: No rash Lower extremity no swelling Neurological system: No tingling.  No numbness.  No other focal signs Musculoskeletal system no bony pains   PAST MEDICAL HISTORY: Past Medical History  Diagnosis Date  . Hyperlipidemia   . Hypertension   . GERD (gastroesophageal reflux disease)   . Arthritis   . Heart  murmur   . Anxiety   . 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    PAST SURGICAL HISTORY: Past Surgical History  Procedure Laterality Date  . Abdominal hysterectomy    . Kneee surgery Left   . Cholecystectomy  2012  . Colonoscopy  2009  . Axillary lymph node biopsy Right 07/14/2014    Procedure: AXILLARY LYMPH NODE BIOPSY;  Surgeon: Robert Bellow, MD;  Location: [facility] ORS;  Service: General;  Laterality: Right;  . Breast biopsy Left 2015  . 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  . Eye surgery  Bilateral 2014  . Joint replacement Left 2001  . Joint replacement Right 2003  . Joint replacement Left 2004    replacement joint broken  . Portacath placement Left 08/27/2014    Procedure: INSERTION PORT-A-CATH;  Surgeon: Robert Bellow, MD;  Location: [facility] ORS;  Service: General;  Laterality: Left;    FAMILY HISTORY There is no significant family history of breast cancer, ovarian cancer, colon cancer       ADVANCED DIRECTIVES: Patient does not have any advanced healthcare directive. Information has been given.   HEALTH MAINTENANCE: Social History  Substance Use Topics  . Smoking status: Never Smoker   . Smokeless tobacco: Never Used  . Alcohol Use: No      Allergies  Allergen Reactions  . Tape Other (See Comments)    Blisters on skin from tape during recent surgery.    Current Outpatient Prescriptions  Medication Sig Dispense Refill  . aspirin 81 MG tablet Take 81 mg by mouth daily.    . calcium carbonate (OS-CAL) 600 MG TABS tablet Take 600 mg by mouth daily.    . cholecalciferol (VITAMIN D) 1000 UNITS tablet Take 1,000 Units by mouth daily.    . fluticasone (FLONASE) 50 MCG/ACT nasal spray Place 1 spray into both nostrils daily. 16 g 2  . hydrochlorothiazide (HYDRODIURIL) 25 MG tablet     . lansoprazole (PREVACID) 15 MG capsule Take 15 mg by mouth every morning.     [name] levofloxacin (LEVAQUIN) 500 MG tablet Take 1 tablet (500 mg total) by mouth daily. 5 tablet 0  . lidocaine-prilocaine (EMLA) cream Apply 1 application topically as needed. 30 g 3  . loratadine (CLARITIN) 10 MG tablet TAKE ONE TABLET BY MOUTH ONCE DAILY 30 tablet 0  . meloxicam (MOBIC) 15 MG tablet 15 mg every morning.     . metoprolol succinate (TOPROL-XL) 50 MG 24 hr tablet Take 50 mg by mouth every morning. Take with or immediately following a meal.    . Multiple Vitamins-Minerals (MULTIVITAMIN) tablet Take 1 tablet by mouth daily.    . ondansetron (ZOFRAN) 8 MG tablet Take 1 tablet (8 mg total)  by mouth 2 (two) times daily. Start the day after chemo for 2 days. Then take as needed for nausea or vomiting. 30 tablet 1  . Oxycodone HCl 10 MG TABS Take 1 tablet (10 mg total) by mouth every 6 (six) hours as needed. 30 tablet 0  . PARoxetine (PAXIL) 40 MG tablet Take 40 mg by mouth at bedtime.     . simvastatin (ZOCOR) 40 MG tablet Take 40 mg by mouth at bedtime.     . traMADol (ULTRAM) 50 MG tablet Take by mouth every 6 (six) hours as needed.    [name]  ciprofloxacin (CIPRO) 500 MG tablet     . gentamicin ointment (GARAMYCIN) 0.1 %     . mupirocin ointment (BACTROBAN) 2 %      No current facility-administered medications for this visit.   Facility-Administered Medications Ordered in Other Visits  Medication Dose Route Frequency Provider Last Rate Last Dose  . heparin lock flush 100 unit/mL  500 Units Intracatheter Once PRN Forest Gleason, MD      . sodium chloride 0.9 % injection 10 mL  10 mL Intracatheter PRN Forest Gleason, MD   10 mL at 10/28/14 0830  . sodium chloride 0.9 % injection 10 mL  10 mL Intravenous PRN Forest Gleason, MD        OBJECTIVE: PHYSICAL EXAM: General  status: Performance status is good.  Patient has not lost significant weight HEENT: No evidence of stomatitis. Sclera and conjunctivae :: No jaundice.   pale looking. Evaluation of the nose shows no evidence of ulceration but redness.  Bleeding. Lungs: Air  entry equal on both sides.  No rhonchi.  No rales.  Cardiac: Heart sounds are normal.  No pericardial rub.  No murmur. Lymphatic system: Cervical, axillary, inguinal, lymph nodes not palpable GI: Abdomen is soft.  No ascites.  Liver spleen not palpable.  No tenderness.  Bowel sounds are within normal limit Lower extremity: No edema Neurological system: Higher functions, cranial nerves intact no evidence of peripheral neuropathy. Skin: No rash.  No ecchymosis.[name] Port Site within normal limit  Filed Vitals:   10/28/14 0842  BP: 144/91  Pulse: 71  Temp: 96.1 F (35.6  C)     Body mass index is 50.95 kg/(m^2).    ECOG FS:0 - Asymptomatic  LAB RESULTS:  Infusion on 10/28/2014  Component Date Value Ref Range Status  . WBC 10/28/2014 4.0  3.6 - 11.0 K/uL Final   A-LINE DRAW  . RBC 10/28/2014 3.82  3.80 - 5.20 MIL/uL Final  . Hemoglobin 10/28/2014 11.9* 12.0 - 16.0 g/dL Final  . HCT 10/28/2014 34.9* 35.0 - 47.0 % Final  . MCV 10/28/2014 91.5  80.0 - 100.0 fL Final  . MCH 10/28/2014 31.2  26.0 - 34.0 pg Final  . MCHC 10/28/2014 34.1  32.0 - 36.0 g/dL Final  . RDW 10/28/2014 14.5  11.5 - 14.5 % Final  . Platelets 10/28/2014 139* 150 - 440 K/uL Final  . Neutrophils Relative % 10/28/2014 63   Final  . Neutro Abs 10/28/2014 2.6  1.4 - 6.5 K/uL Final  . Lymphocytes Relative 10/28/2014 29   Final  . Lymphs Abs 10/28/2014 1.2  1.0 - 3.6 K/uL Final  . Monocytes Relative 10/28/2014 5   Final  . Monocytes Absolute 10/28/2014 0.2  0.2 - 0.9 K/uL Final  . Eosinophils Relative 10/28/2014 2   Final  . Eosinophils Absolute 10/28/2014 0.1  0 - 0.7 K/uL Final  . Basophils Relative 10/28/2014 1   Final  . Basophils Absolute 10/28/2014 0.0  0 - 0.1 K/uL Final  . Sodium 10/28/2014 134* 135 - 145 mmol/L Final  . Potassium 10/28/2014 3.2* 3.5 - 5.1 mmol/L Final  . Chloride 10/28/2014 103  101 - 111 mmol/L Final  . CO2 10/28/2014 26  22 - 32 mmol/L Final  . Glucose, Bld 10/28/2014 125* 65 - 99 mg/dL Final  . BUN 10/28/2014 25* 6 - 20 mg/dL Final  . Creatinine, Ser 10/28/2014 1.02* 0.44 - 1.00 mg/dL Final  . Calcium 10/28/2014 8.4* 8.9 - 10.3 mg/dL Final  . Total Protein 10/28/2014 6.0*  6.5 - 8.1 g/dL Final  . Albumin 10/28/2014 3.5  3.5 - 5.0 g/dL Final  . AST 10/28/2014 29  15 - 41 U/L Final  . ALT 10/28/2014 19  14 - 54 U/L Final  . Alkaline Phosphatase 10/28/2014 66  38 - 126 U/L Final  . Total Bilirubin 10/28/2014 1.5* 0.3 - 1.2 mg/dL Final  . GFR calc non Af Amer 10/28/2014 53* >60 mL/min Final  . GFR calc Af Amer 10/28/2014 >60  >60 mL/min Final   Comment:  (NOTE) The eGFR has been calculated using the CKD EPI equation. This calculation has not been validated in all clinical situations. eGFR's persistently <60 mL/min signify possible Chronic Kidney Disease.   . Anion gap 10/28/2014 5  5 - 15 Final      ASSESSMENT:  74 year old lady with a history of carcinoma of breast stage IB Sentinel lymph nodes negative Estrogen receptor progesterone receptor positive HER-2/neu receptor and overexpressed continue chemotherapy  All lab data has been reviewed and will continue Taxol and Herceptin therapy   PLAN:  All lab data has been reviewed Continue chemotherapy today and next week.  Staging form: Breast, AJCC 7th Edition     Clinical: Stage IA (T1b, N0, M0) - Signed by Forest Gleason, MD on 07/28/2014   Forest Gleason, MD   10/28/2014 1:54 PM

## 2014-10-28 NOTE — Progress Notes (Signed)
Met patient during her treatment today.  She is doing "fairly well." No needs at this time.  She is to call if she has any questions or needs.

## 2014-10-28 NOTE — Progress Notes (Signed)
Patient does not have living will.  Never smoked.  Patient saw Dr. Tami Ribas for nose bleeds.  States she continues to have them.  Will see him again next week. Wants to discuss Benadryl with MD.  Makes her feel like she cannot be still. Has to sit on the edge of her chair.

## 2014-11-04 ENCOUNTER — Inpatient Hospital Stay: Payer: Medicare HMO

## 2014-11-04 ENCOUNTER — Inpatient Hospital Stay: Payer: Medicare HMO | Attending: Oncology

## 2014-11-04 DIAGNOSIS — C50911 Malignant neoplasm of unspecified site of right female breast: Secondary | ICD-10-CM

## 2014-11-04 DIAGNOSIS — Z170 Estrogen receptor positive status [ER+]: Secondary | ICD-10-CM | POA: Diagnosis not present

## 2014-11-04 DIAGNOSIS — Z79899 Other long term (current) drug therapy: Secondary | ICD-10-CM | POA: Diagnosis not present

## 2014-11-04 DIAGNOSIS — E878 Other disorders of electrolyte and fluid balance, not elsewhere classified: Secondary | ICD-10-CM | POA: Insufficient documentation

## 2014-11-04 DIAGNOSIS — E785 Hyperlipidemia, unspecified: Secondary | ICD-10-CM | POA: Diagnosis not present

## 2014-11-04 DIAGNOSIS — F419 Anxiety disorder, unspecified: Secondary | ICD-10-CM | POA: Diagnosis not present

## 2014-11-04 DIAGNOSIS — K219 Gastro-esophageal reflux disease without esophagitis: Secondary | ICD-10-CM | POA: Insufficient documentation

## 2014-11-04 DIAGNOSIS — Z5111 Encounter for antineoplastic chemotherapy: Secondary | ICD-10-CM | POA: Diagnosis not present

## 2014-11-04 DIAGNOSIS — C50411 Malignant neoplasm of upper-outer quadrant of right female breast: Secondary | ICD-10-CM | POA: Diagnosis not present

## 2014-11-04 DIAGNOSIS — I10 Essential (primary) hypertension: Secondary | ICD-10-CM | POA: Insufficient documentation

## 2014-11-04 LAB — CBC WITH DIFFERENTIAL/PLATELET
BASOS PCT: 3 %
Basophils Absolute: 0.1 10*3/uL (ref 0–0.1)
Eosinophils Absolute: 0.1 10*3/uL (ref 0–0.7)
Eosinophils Relative: 7 %
HEMATOCRIT: 33.2 % — AB (ref 35.0–47.0)
HEMOGLOBIN: 11.4 g/dL — AB (ref 12.0–16.0)
LYMPHS ABS: 1.1 10*3/uL (ref 1.0–3.6)
Lymphocytes Relative: 47 %
MCH: 31.3 pg (ref 26.0–34.0)
MCHC: 34.3 g/dL (ref 32.0–36.0)
MCV: 91.4 fL (ref 80.0–100.0)
MONOS PCT: 6 %
Monocytes Absolute: 0.1 10*3/uL — ABNORMAL LOW (ref 0.2–0.9)
NEUTROS ABS: 0.9 10*3/uL — AB (ref 1.4–6.5)
NEUTROS PCT: 39 %
Platelets: 176 10*3/uL (ref 150–440)
RBC: 3.63 MIL/uL — ABNORMAL LOW (ref 3.80–5.20)
RDW: 14.8 % — ABNORMAL HIGH (ref 11.5–14.5)
WBC: 2.3 10*3/uL — ABNORMAL LOW (ref 3.6–11.0)

## 2014-11-04 MED ORDER — HEPARIN SOD (PORK) LOCK FLUSH 100 UNIT/ML IV SOLN
INTRAVENOUS | Status: AC
  Filled 2014-11-04: qty 5

## 2014-11-11 ENCOUNTER — Inpatient Hospital Stay: Payer: Medicare HMO

## 2014-11-11 ENCOUNTER — Inpatient Hospital Stay (HOSPITAL_BASED_OUTPATIENT_CLINIC_OR_DEPARTMENT_OTHER): Payer: Medicare HMO | Admitting: Oncology

## 2014-11-11 ENCOUNTER — Encounter: Payer: Self-pay | Admitting: Oncology

## 2014-11-11 VITALS — BP 144/77 | HR 90 | Temp 97.5°F | Resp 20

## 2014-11-11 VITALS — BP 133/76 | HR 94 | Temp 97.4°F | Wt 296.6 lb

## 2014-11-11 DIAGNOSIS — C50411 Malignant neoplasm of upper-outer quadrant of right female breast: Secondary | ICD-10-CM | POA: Diagnosis not present

## 2014-11-11 DIAGNOSIS — Z5111 Encounter for antineoplastic chemotherapy: Secondary | ICD-10-CM | POA: Diagnosis not present

## 2014-11-11 DIAGNOSIS — Z79899 Other long term (current) drug therapy: Secondary | ICD-10-CM | POA: Diagnosis not present

## 2014-11-11 DIAGNOSIS — Z170 Estrogen receptor positive status [ER+]: Secondary | ICD-10-CM

## 2014-11-11 DIAGNOSIS — C50911 Malignant neoplasm of unspecified site of right female breast: Secondary | ICD-10-CM

## 2014-11-11 LAB — CBC WITH DIFFERENTIAL/PLATELET
Basophils Absolute: 0 10*3/uL (ref 0–0.1)
Basophils Relative: 1 %
EOS ABS: 0.1 10*3/uL (ref 0–0.7)
Eosinophils Relative: 3 %
HEMATOCRIT: 35.3 % (ref 35.0–47.0)
HEMOGLOBIN: 12 g/dL (ref 12.0–16.0)
LYMPHS ABS: 1.7 10*3/uL (ref 1.0–3.6)
Lymphocytes Relative: 38 %
MCH: 31.1 pg (ref 26.0–34.0)
MCHC: 33.9 g/dL (ref 32.0–36.0)
MCV: 92 fL (ref 80.0–100.0)
MONO ABS: 0.8 10*3/uL (ref 0.2–0.9)
MONOS PCT: 19 %
NEUTROS PCT: 39 %
Neutro Abs: 1.7 10*3/uL (ref 1.4–6.5)
Platelets: 217 10*3/uL (ref 150–440)
RBC: 3.84 MIL/uL (ref 3.80–5.20)
RDW: 15.2 % — ABNORMAL HIGH (ref 11.5–14.5)
WBC: 4.3 10*3/uL (ref 3.6–11.0)

## 2014-11-11 MED ORDER — PACLITAXEL CHEMO INJECTION 300 MG/50ML
80.0000 mg/m2 | Freq: Once | INTRAVENOUS | Status: AC
  Administered 2014-11-11: 198 mg via INTRAVENOUS
  Filled 2014-11-11: qty 33

## 2014-11-11 MED ORDER — FAMOTIDINE IN NACL 20-0.9 MG/50ML-% IV SOLN
20.0000 mg | Freq: Once | INTRAVENOUS | Status: AC
Start: 2014-11-11 — End: 2014-11-11
  Administered 2014-11-11: 20 mg via INTRAVENOUS
  Filled 2014-11-11: qty 50

## 2014-11-11 MED ORDER — HEPARIN SOD (PORK) LOCK FLUSH 100 UNIT/ML IV SOLN
500.0000 [IU] | Freq: Once | INTRAVENOUS | Status: AC | PRN
  Administered 2014-11-11: 500 [IU]

## 2014-11-11 MED ORDER — HEPARIN SOD (PORK) LOCK FLUSH 100 UNIT/ML IV SOLN
500.0000 [IU] | Freq: Once | INTRAVENOUS | Status: AC
  Filled 2014-11-11: qty 5

## 2014-11-11 MED ORDER — SODIUM CHLORIDE 0.9 % IV SOLN
Freq: Once | INTRAVENOUS | Status: AC
  Administered 2014-11-11: 10:00:00 via INTRAVENOUS
  Filled 2014-11-11: qty 4

## 2014-11-11 MED ORDER — SODIUM CHLORIDE 0.9 % IJ SOLN
10.0000 mL | INTRAMUSCULAR | Status: DC | PRN
  Administered 2014-11-11: 10 mL via INTRAVENOUS
  Filled 2014-11-11: qty 10

## 2014-11-11 MED ORDER — DIPHENHYDRAMINE HCL 50 MG/ML IJ SOLN
6.2500 mg | Freq: Once | INTRAMUSCULAR | Status: AC
  Administered 2014-11-11: 6.5 mg via INTRAVENOUS
  Filled 2014-11-11: qty 1

## 2014-11-11 MED ORDER — SODIUM CHLORIDE 0.9 % IV SOLN
Freq: Once | INTRAVENOUS | Status: AC
  Administered 2014-11-11: 10:00:00 via INTRAVENOUS
  Filled 2014-11-11: qty 1000

## 2014-11-11 NOTE — Progress Notes (Signed)
[place] @ [facility] [phone]  Fax:([phone]) [place]: [birth year]  MR#: [id number]  EQA#:[id number]  Patient Care Team: [name], MD as PCP - General (Family Medicine) [name], MD as Referring Physician (Family Medicine) Robert Bellow, MD (General Surgery)  CHIEF COMPLAINT:  Chief Complaint  Patient presents with  . Follow-up    Breast Cancer, Chemo today with Taxol if labs ok.   Oncology History   1.  Carcinoma of right breast.  Status post lumpectomy and sentinel lymph node evaluation.  Tumor size 1 cm.  Estrogen and progesterone receptor positive.  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  Diagnosis in April of 2016 2.  Accelerated partial breast radiation in June of 2016 3.  Weekly Taxol and Herceptin therapy for adjuvant treatment starting from July of 2016      VISIT DIAGNOSIS:   No diagnosis found.    Oncology Flowsheet 08/27/2014 09/05/2014 09/12/2014 09/23/2014 09/30/2014 10/21/2014 10/28/2014  Day, Cycle - Day 1, Cycle 1 Day 1, Cycle 2 Day 1, Cycle 3 Day 1, Cycle 4 Day 1, Cycle 6 Day 1, Cycle 7  dexamethasone (DECADRON) IV - [ 20 mg ] [ 20 mg ] [ 10 mg ] [ 10 mg ] [ 10 mg ] [ 6 mg ]  ondansetron (ZOFRAN) IJ - - - - - - -  ondansetron (ZOFRAN) IV - [ 8 mg ] [ 8 mg ] [ 8 mg ] [ 8 mg ] [ 8 mg ] [ 8 mg ]  PACLitaxel (TAXOL) IV - 80 mg/m2 80 mg/m2 80 mg/m2 80 mg/m2 80 mg/m2 80 mg/m2  trastuzumab (HERCEPTIN) IV - 8 mg/kg - - 6 mg/kg - 6 mg/kg    INTERVAL HISTORY:  74 year old lady with a history of carcinoma breast on Taxol and Herceptin tolerating treatment very well.  No chills.  No fever.  No nausea.  No vomiting.  Has lost her hair. Patient is here for ongoing evaluation and treatment consideration.  Patient had some aches and pains in the muscle which is gradually getting better. . Taking Claritin tolerating treatment very well.  Appetite is improved.  No tingling.  No numbness.   Patient chemotherapy was deferred last week  because of absolute neutrophil count was below 1000.  Patient also had sinus infection.  Bleeding from the nose.  Patient was given Levaquin and Flonase which helped but still still feel like congested.  No chills.  No fever. Feels like has multiple ulcers in the nose. October 28, 2014 Patient is for the next cycle of Taxol chemotherapy.  She had some mild bleeding from the nose has been evaluated by ENT physician.  No chills.  No fever.  No tingling numbness.  No nausea no vomiting.  No rash For continuation of chemotherapy with Taxol and Herceptin  November 11, 2014 Patient could not get chemotherapy on September 6 because of low blood count.  Did not have any fever.  No chills.  No tingling numbness.  No soreness in the mouth.  Patient is here for ongoing evaluation and continuation of chemotherapy.  REVIEW OF SYSTEMS:     general status: Alert and oriented and not any acute distress.  No change in a performance status.  No chills.  No fever. HEENT: Continues to have congested nose and congested sinuses.  No fever.  Occasional epistaxis. Lungs: No cough or shortness of breath Cardiac: No chest pain or paroxysmal nocturnal dyspnea GI: No nausea no vomiting no diarrhea  no abdominal pain Skin: No rash Lower extremity no swelling Neurological system: No tingling.  No numbness.  No other focal signs Musculoskeletal system no bony pains   PAST MEDICAL HISTORY: Past Medical History  Diagnosis Date  . Hyperlipidemia   . Hypertension   . GERD (gastroesophageal reflux disease)   . Arthritis   . Heart murmur   . Anxiety   . 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    PAST SURGICAL HISTORY: Past Surgical History  Procedure Laterality Date  . Abdominal hysterectomy    . Kneee surgery Left   . Cholecystectomy  2012  . Colonoscopy  2009  . Axillary lymph node biopsy Right 07/14/2014    Procedure:  AXILLARY LYMPH NODE BIOPSY;  Surgeon: Robert Bellow, MD;  Location: [facility] ORS;  Service: General;  Laterality: Right;  . Breast biopsy Left 2015  . 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  . Eye surgery Bilateral 2014  . Joint replacement Left 2001  . Joint replacement Right 2003  . Joint replacement Left 2004    replacement joint broken  . Portacath placement Left 08/27/2014    Procedure: INSERTION PORT-A-CATH;  Surgeon: Robert Bellow, MD;  Location: [facility] ORS;  Service: General;  Laterality: Left;    FAMILY HISTORY There is no significant family history of breast cancer, ovarian cancer, colon cancer       ADVANCED DIRECTIVES: Patient does not have any advanced healthcare directive. Information has been given.   HEALTH MAINTENANCE: Social History  Substance Use Topics  . Smoking status: Never Smoker   . Smokeless tobacco: Never Used  . Alcohol Use: No      Allergies  Allergen Reactions  . Tape Other (See Comments)    Blisters on skin from tape during recent surgery.    Current Outpatient Prescriptions  Medication Sig Dispense Refill  . aspirin 81 MG tablet Take 81 mg by mouth daily.    . calcium carbonate (OS-CAL) 600 MG TABS tablet Take 600 mg by mouth daily.    . cholecalciferol (VITAMIN D) 1000 UNITS tablet Take 1,000 Units by mouth daily.    . ciprofloxacin (CIPRO) 500 MG tablet     . fluticasone (FLONASE) 50 MCG/ACT nasal spray Place 1 spray into both nostrils daily. 16 g 2  . gentamicin ointment (GARAMYCIN) 0.1 %     . hydrochlorothiazide (HYDRODIURIL) 25 MG tablet     . lansoprazole (PREVACID) 15 MG capsule Take 15 mg by mouth every morning.     [name] levofloxacin (LEVAQUIN) 500 MG tablet Take 1 tablet (500 mg total) by mouth daily. 5 tablet 0  . lidocaine-prilocaine (EMLA) cream Apply 1 application topically as needed. 30 g 3  . loratadine (CLARITIN)  10 MG tablet TAKE ONE TABLET BY MOUTH ONCE DAILY 30 tablet 0  . meloxicam (MOBIC) 15 MG tablet 15 mg every morning.     . metoprolol succinate (TOPROL-XL) 50 MG 24 hr tablet Take 50 mg by mouth every morning. Take with or immediately following a meal.    . Multiple Vitamins-Minerals (MULTIVITAMIN) tablet Take 1 tablet by mouth daily.    . mupirocin ointment (BACTROBAN) 2 %     . ondansetron (ZOFRAN) 8 MG tablet Take 1 tablet (8 mg total) by mouth 2 (two) times daily. Start the day  after chemo for 2 days. Then take as needed for nausea or vomiting. 30 tablet 1  . Oxycodone HCl 10 MG TABS Take 1 tablet (10 mg total) by mouth every 6 (six) hours as needed. 30 tablet 0  . PARoxetine (PAXIL) 40 MG tablet Take 40 mg by mouth at bedtime.     . simvastatin (ZOCOR) 40 MG tablet Take 40 mg by mouth at bedtime.     . traMADol (ULTRAM) 50 MG tablet Take by mouth every 6 (six) hours as needed.     No current facility-administered medications for this visit.   Facility-Administered Medications Ordered in Other Visits  Medication Dose Route Frequency Provider Last Rate Last Dose  . heparin lock flush 100 unit/mL  500 Units Intravenous Once Forest Gleason, MD      . sodium chloride 0.9 % injection 10 mL  10 mL Intravenous PRN Forest Gleason, MD   10 mL at 11/11/14 0845    OBJECTIVE: PHYSICAL EXAM: General  status: Performance status is good.  Patient has not lost significant weight HEENT: No evidence of stomatitis. Sclera and conjunctivae :: No jaundice.   pale looking. Evaluation of the nose shows no evidence of ulceration but redness.  Bleeding. Lungs: Air  entry equal on both sides.  No rhonchi.  No rales.  Cardiac: Heart sounds are normal.  No pericardial rub.  No murmur. Lymphatic system: Cervical, axillary, inguinal, lymph nodes not palpable GI: Abdomen is soft.  No ascites.  Liver spleen not palpable.  No tenderness.  Bowel sounds are within normal limit Lower extremity: No edema Neurological system:  Higher functions, cranial nerves intact no evidence of peripheral neuropathy. Skin: No rash.  No ecchymosis.[name] [place] Site within normal limit  Filed Vitals:   11/11/14 0858  BP: 133/76  Pulse: 94  Temp: 97.4 F (36.3 C)     Body mass index is 50.89 kg/(m^2).    ECOG FS:0 - Asymptomatic  LAB RESULTS:  Infusion on 11/11/2014  Component Date Value Ref Range Status  . WBC 11/11/2014 4.3  3.6 - 11.0 K/uL Final  . RBC 11/11/2014 3.84  3.80 - 5.20 MIL/uL Final  . Hemoglobin 11/11/2014 12.0  12.0 - 16.0 g/dL Final  . HCT 11/11/2014 35.3  35.0 - 47.0 % Final  . MCV 11/11/2014 92.0  80.0 - 100.0 fL Final  . MCH 11/11/2014 31.1  26.0 - 34.0 pg Final  . MCHC 11/11/2014 33.9  32.0 - 36.0 g/dL Final  . RDW 11/11/2014 15.2* 11.5 - 14.5 % Final  . Platelets 11/11/2014 217  150 - 440 K/uL Final  . Neutrophils Relative % 11/11/2014 39   Final  . Neutro Abs 11/11/2014 1.7  1.4 - 6.5 K/uL Final  . Lymphocytes Relative 11/11/2014 38   Final  . Lymphs Abs 11/11/2014 1.7  1.0 - 3.6 K/uL Final  . Monocytes Relative 11/11/2014 19   Final  . Monocytes Absolute 11/11/2014 0.8  0.2 - 0.9 K/uL Final  . Eosinophils Relative 11/11/2014 3   Final  . Eosinophils Absolute 11/11/2014 0.1  0 - 0.7 K/uL Final  . Basophils Relative 11/11/2014 1   Final  . Basophils Absolute 11/11/2014 0.0  0 - 0.1 K/uL Final      ASSESSMENT:  74 year old lady with a history of carcinoma of breast stage IB Sentinel lymph nodes negative Estrogen receptor progesterone receptor positive HER-2/neu receptor and overexpressed continue chemotherapy  All lab data has been reviewed and will continue Taxol and Herceptin therapy   PLAN:  All lab data has been reviewed This and could not get chemotherapy on 11/04/2011 Proceed with Taxol chemotherapy on 913 and 1920 patient will receive Herceptin and Taxol on 9/27 .  IF blood count permits Continue chemotherapy today and next week.  Staging form: Breast, AJCC 7th Edition     Clinical:  Stage IA (T1b, N0, M0) - Signed by Forest Gleason, MD on 07/28/2014   Forest Gleason, MD   11/11/2014 9:15 AM

## 2014-11-18 ENCOUNTER — Inpatient Hospital Stay: Payer: Medicare HMO

## 2014-11-18 VITALS — BP 116/69 | HR 67 | Temp 97.4°F | Resp 20

## 2014-11-18 DIAGNOSIS — Z5111 Encounter for antineoplastic chemotherapy: Secondary | ICD-10-CM | POA: Diagnosis not present

## 2014-11-18 DIAGNOSIS — C50911 Malignant neoplasm of unspecified site of right female breast: Secondary | ICD-10-CM

## 2014-11-18 LAB — CBC WITH DIFFERENTIAL/PLATELET
BASOS ABS: 0 10*3/uL (ref 0–0.1)
BASOS PCT: 1 %
EOS ABS: 0.1 10*3/uL (ref 0–0.7)
Eosinophils Relative: 1 %
HEMATOCRIT: 33.9 % — AB (ref 35.0–47.0)
HEMOGLOBIN: 11.4 g/dL — AB (ref 12.0–16.0)
Lymphocytes Relative: 24 %
Lymphs Abs: 1.3 10*3/uL (ref 1.0–3.6)
MCH: 30.8 pg (ref 26.0–34.0)
MCHC: 33.6 g/dL (ref 32.0–36.0)
MCV: 91.6 fL (ref 80.0–100.0)
MONO ABS: 0.3 10*3/uL (ref 0.2–0.9)
Monocytes Relative: 6 %
NEUTROS ABS: 3.7 10*3/uL (ref 1.4–6.5)
NEUTROS PCT: 68 %
Platelets: 183 10*3/uL (ref 150–440)
RBC: 3.7 MIL/uL — ABNORMAL LOW (ref 3.80–5.20)
RDW: 15.2 % — AB (ref 11.5–14.5)
WBC: 5.4 10*3/uL (ref 3.6–11.0)

## 2014-11-18 MED ORDER — PACLITAXEL CHEMO INJECTION 300 MG/50ML
80.0000 mg/m2 | Freq: Once | INTRAVENOUS | Status: AC
  Administered 2014-11-18: 198 mg via INTRAVENOUS
  Filled 2014-11-18: qty 33

## 2014-11-18 MED ORDER — SODIUM CHLORIDE 0.9 % IV SOLN
Freq: Once | INTRAVENOUS | Status: AC
  Administered 2014-11-18: 10:00:00 via INTRAVENOUS
  Filled 2014-11-18: qty 4

## 2014-11-18 MED ORDER — HEPARIN SOD (PORK) LOCK FLUSH 100 UNIT/ML IV SOLN
500.0000 [IU] | Freq: Once | INTRAVENOUS | Status: AC
  Administered 2014-11-18: 500 [IU] via INTRAVENOUS
  Filled 2014-11-18: qty 5

## 2014-11-18 MED ORDER — FAMOTIDINE IN NACL 20-0.9 MG/50ML-% IV SOLN
20.0000 mg | Freq: Once | INTRAVENOUS | Status: AC
  Administered 2014-11-18: 20 mg via INTRAVENOUS
  Filled 2014-11-18: qty 50

## 2014-11-18 MED ORDER — HEPARIN SOD (PORK) LOCK FLUSH 100 UNIT/ML IV SOLN
500.0000 [IU] | Freq: Once | INTRAVENOUS | Status: AC | PRN
  Administered 2014-11-18: 500 [IU]

## 2014-11-18 MED ORDER — SODIUM CHLORIDE 0.9 % IJ SOLN
10.0000 mL | INTRAMUSCULAR | Status: DC | PRN
  Administered 2014-11-18: 10 mL via INTRAVENOUS
  Filled 2014-11-18: qty 10

## 2014-11-18 MED ORDER — SODIUM CHLORIDE 0.9 % IV SOLN
Freq: Once | INTRAVENOUS | Status: AC
  Administered 2014-11-18: 10:00:00 via INTRAVENOUS
  Filled 2014-11-18: qty 1000

## 2014-11-18 MED ORDER — ACETAMINOPHEN 325 MG PO TABS
650.0000 mg | ORAL_TABLET | Freq: Once | ORAL | Status: AC
  Administered 2014-11-18: 650 mg via ORAL
  Filled 2014-11-18: qty 2

## 2014-11-18 MED ORDER — SODIUM CHLORIDE 0.9 % IV SOLN
6.0000 mg/kg | Freq: Once | INTRAVENOUS | Status: AC
  Administered 2014-11-18: 798 mg via INTRAVENOUS
  Filled 2014-11-18: qty 38

## 2014-11-18 MED ORDER — DIPHENHYDRAMINE HCL 50 MG/ML IJ SOLN
6.2500 mg | Freq: Once | INTRAMUSCULAR | Status: AC
  Administered 2014-11-18: 6.5 mg via INTRAVENOUS
  Filled 2014-11-18: qty 1

## 2014-11-18 NOTE — Progress Notes (Signed)
   11/18/14 0930  Clinical Encounter Type  Visited With Patient and family together  Visit Type Initial  Provided pastoral presence and support to patient and her spouse in the cancer center.  [place] [phone]

## 2014-11-25 ENCOUNTER — Encounter: Payer: Self-pay | Admitting: Oncology

## 2014-11-25 ENCOUNTER — Inpatient Hospital Stay: Payer: Medicare HMO

## 2014-11-25 ENCOUNTER — Inpatient Hospital Stay (HOSPITAL_BASED_OUTPATIENT_CLINIC_OR_DEPARTMENT_OTHER): Payer: Medicare HMO | Admitting: Oncology

## 2014-11-25 VITALS — BP 150/82 | HR 82 | Temp 96.6°F | Wt 290.2 lb

## 2014-11-25 DIAGNOSIS — Z79899 Other long term (current) drug therapy: Secondary | ICD-10-CM

## 2014-11-25 DIAGNOSIS — Z5111 Encounter for antineoplastic chemotherapy: Secondary | ICD-10-CM | POA: Diagnosis not present

## 2014-11-25 DIAGNOSIS — E878 Other disorders of electrolyte and fluid balance, not elsewhere classified: Secondary | ICD-10-CM | POA: Diagnosis not present

## 2014-11-25 DIAGNOSIS — C50411 Malignant neoplasm of upper-outer quadrant of right female breast: Secondary | ICD-10-CM

## 2014-11-25 DIAGNOSIS — C50911 Malignant neoplasm of unspecified site of right female breast: Secondary | ICD-10-CM

## 2014-11-25 DIAGNOSIS — Z170 Estrogen receptor positive status [ER+]: Secondary | ICD-10-CM

## 2014-11-25 LAB — MAGNESIUM: Magnesium: 1.5 mg/dL — ABNORMAL LOW (ref 1.7–2.4)

## 2014-11-25 LAB — COMPREHENSIVE METABOLIC PANEL
ALBUMIN: 3.8 g/dL (ref 3.5–5.0)
ALK PHOS: 70 U/L (ref 38–126)
ALT: 22 U/L (ref 14–54)
AST: 34 U/L (ref 15–41)
Anion gap: 10 (ref 5–15)
BILIRUBIN TOTAL: 1.4 mg/dL — AB (ref 0.3–1.2)
BUN: 21 mg/dL — AB (ref 6–20)
CALCIUM: 8.9 mg/dL (ref 8.9–10.3)
CO2: 25 mmol/L (ref 22–32)
CREATININE: 1.2 mg/dL — AB (ref 0.44–1.00)
Chloride: 103 mmol/L (ref 101–111)
GFR calc Af Amer: 51 mL/min — ABNORMAL LOW (ref >60)
GFR calc non Af Amer: 44 mL/min — ABNORMAL LOW (ref >60)
GLUCOSE: 115 mg/dL — AB (ref 65–99)
Potassium: 3.3 mmol/L — ABNORMAL LOW (ref 3.5–5.1)
Sodium: 138 mmol/L (ref 135–145)
TOTAL PROTEIN: 6.5 g/dL (ref 6.5–8.1)

## 2014-11-25 LAB — CBC WITH DIFFERENTIAL/PLATELET
BASOS ABS: 0 10*3/uL (ref 0–0.1)
BASOS PCT: 1 %
Eosinophils Absolute: 0.1 10*3/uL (ref 0–0.7)
Eosinophils Relative: 2 %
HEMATOCRIT: 33.4 % — AB (ref 35.0–47.0)
HEMOGLOBIN: 11.5 g/dL — AB (ref 12.0–16.0)
LYMPHS PCT: 45 %
Lymphs Abs: 1.4 10*3/uL (ref 1.0–3.6)
MCH: 31.4 pg (ref 26.0–34.0)
MCHC: 34.4 g/dL (ref 32.0–36.0)
MCV: 91.2 fL (ref 80.0–100.0)
Monocytes Absolute: 0.1 10*3/uL — ABNORMAL LOW (ref 0.2–0.9)
Monocytes Relative: 5 %
NEUTROS ABS: 1.4 10*3/uL (ref 1.4–6.5)
NEUTROS PCT: 47 %
Platelets: 171 10*3/uL (ref 150–440)
RBC: 3.66 MIL/uL — ABNORMAL LOW (ref 3.80–5.20)
RDW: 15.8 % — ABNORMAL HIGH (ref 11.5–14.5)
WBC: 3 10*3/uL — ABNORMAL LOW (ref 3.6–11.0)

## 2014-11-25 MED ORDER — DEXAMETHASONE SODIUM PHOSPHATE 100 MG/10ML IJ SOLN
Freq: Once | INTRAMUSCULAR | Status: AC
  Administered 2014-11-25: 11:00:00 via INTRAVENOUS
  Filled 2014-11-25: qty 4

## 2014-11-25 MED ORDER — FAMOTIDINE IN NACL 20-0.9 MG/50ML-% IV SOLN
20.0000 mg | Freq: Once | INTRAVENOUS | Status: AC
  Administered 2014-11-25: 20 mg via INTRAVENOUS
  Filled 2014-11-25: qty 50

## 2014-11-25 MED ORDER — HEPARIN SOD (PORK) LOCK FLUSH 100 UNIT/ML IV SOLN
INTRAVENOUS | Status: AC
  Filled 2014-11-25: qty 5

## 2014-11-25 MED ORDER — PACLITAXEL CHEMO INJECTION 300 MG/50ML
80.0000 mg/m2 | Freq: Once | INTRAVENOUS | Status: AC
  Administered 2014-11-25: 198 mg via INTRAVENOUS
  Filled 2014-11-25: qty 33

## 2014-11-25 MED ORDER — SODIUM CHLORIDE 0.9 % IV SOLN
Freq: Once | INTRAVENOUS | Status: DC
  Administered 2014-11-25: 13:00:00 via INTRAVENOUS
  Filled 2014-11-25: qty 500

## 2014-11-25 MED ORDER — SODIUM CHLORIDE 0.9 % IJ SOLN
10.0000 mL | INTRAMUSCULAR | Status: DC | PRN
  Administered 2014-11-25: 10 mL via INTRAVENOUS
  Filled 2014-11-25: qty 10

## 2014-11-25 MED ORDER — HEPARIN SOD (PORK) LOCK FLUSH 100 UNIT/ML IV SOLN
500.0000 [IU] | Freq: Once | INTRAVENOUS | Status: AC
  Administered 2014-11-25: 500 [IU] via INTRAVENOUS
  Filled 2014-11-25: qty 5

## 2014-11-25 MED ORDER — DIPHENHYDRAMINE HCL 50 MG/ML IJ SOLN
6.2500 mg | Freq: Once | INTRAMUSCULAR | Status: AC
  Administered 2014-11-25: 6.5 mg via INTRAVENOUS
  Filled 2014-11-25: qty 1

## 2014-11-25 MED ORDER — SODIUM CHLORIDE 0.9 % IV SOLN
Freq: Once | INTRAVENOUS | Status: AC
  Administered 2014-11-25: 10:00:00 via INTRAVENOUS
  Filled 2014-11-25: qty 1000

## 2014-11-25 NOTE — Progress Notes (Signed)
Patient does not have living will. Never smoked.  Patient complains of fatigue.  Appetite decreased.  Husband says it is all patient can do to walk from one end of the house to another.

## 2014-11-25 NOTE — Progress Notes (Addendum)
[place]  [phone]) [phone]  [phone]) [phone]     Maria Gutierrez DOB: 10/18/1940  MR#: [id number]  RSW#:[id number]  Patient Care Team: [name], MD as PCP - General (Family Medicine) [name], MD as Referring Physician (Family Medicine) Robert Bellow, MD (General Surgery)  CHIEF COMPLAINT:  Chief Complaint  Patient presents with  . OTHER   Breast Cancer, for chemotherapy with Taxol today, cycle 11  INTERVAL HISTORY: Patient is here for further evaluation and treatment consideration regarding carcinoma of the right breast, ER/PR positive, HER-2 positive. She is for cycle 11 of 12 Taxol chemotherapy today. She last received Herceptin on September 20th. She overall reports feeling fatigued. Encouraged patient to try and exercise as much as possible. She denies any peripheral neuropathy. She reports that she has noticed a darkening of her urine output. She reports also having difficulty with getting the recommended water intake daily.   REVIEW OF SYSTEMS:   Review of Systems  Constitutional: Positive for malaise/fatigue. Negative for fever, chills, weight loss and diaphoresis.  HENT: Negative for congestion, ear discharge, ear pain, hearing loss, nosebleeds, sore throat and tinnitus.   Eyes: Negative for blurred vision, double vision, photophobia, pain, discharge and redness.  Respiratory: Negative for cough, hemoptysis, sputum production, shortness of breath, wheezing and stridor.   Cardiovascular: Negative for chest pain, palpitations, orthopnea, claudication, leg swelling and PND.  Gastrointestinal: Negative for heartburn, nausea, vomiting, abdominal pain, diarrhea, constipation, blood in stool and melena.  Genitourinary: Negative.        Darker color  Musculoskeletal: Negative.   Skin: Negative.   Neurological: Negative for dizziness, tingling, focal weakness, seizures, weakness and headaches.  Endo/Heme/Allergies: Does not bruise/bleed easily.   Psychiatric/Behavioral: Negative for depression. The patient is not nervous/anxious and does not have insomnia.     As per HPI. Otherwise, a complete review of systems is negatve.  ONCOLOGY HISTORY: Oncology History   1.  Carcinoma of right breast.  Status post lumpectomy and sentinel lymph node evaluation.  Tumor size 1 cm.  Estrogen and progesterone receptor positive.  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  Diagnosis in April of 2016 2.  Accelerated partial breast radiation in June of 2016 3.  Weekly Taxol and Herceptin therapy for adjuvant treatment starting from July of 2016     Invasive ductal carcinoma of breast, stage 1   06/17/2014 Initial Diagnosis Invasive ductal carcinoma of breast, stage 1    PAST MEDICAL HISTORY: Past Medical History  Diagnosis Date  . Hyperlipidemia   . Hypertension   . GERD (gastroesophageal reflux disease)   . Arthritis   . Heart murmur   . Anxiety   . 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    PAST SURGICAL HISTORY: Past Surgical History  Procedure Laterality Date  . Abdominal hysterectomy    . Kneee surgery Left   . Cholecystectomy  2012  . Colonoscopy  2009  . Axillary lymph node biopsy Right 07/14/2014    Procedure: AXILLARY LYMPH NODE BIOPSY;  Surgeon: Robert Bellow, MD;  Location: [facility] ORS;  Service: General;  Laterality: Right;  . Breast biopsy Left 2015  . 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  . Eye surgery Bilateral 2014  . Joint replacement Left 2001  . Joint replacement Right 2003  . Joint replacement  Left 2004    replacement joint broken  . Portacath placement Left 08/27/2014    Procedure: INSERTION PORT-A-CATH;  Surgeon: Robert Bellow, MD;  Location: [facility] ORS;  Service: General;  Laterality: Left;    FAMILY HISTORY No family  history on file.  GYNECOLOGIC HISTORY:  No LMP recorded. Patient has had a hysterectomy.     ADVANCED DIRECTIVES:    HEALTH MAINTENANCE: Social History  Substance Use Topics  . Smoking status: Never Smoker   . Smokeless tobacco: Never Used  . Alcohol Use: No     Colonoscopy:  PAP:  Bone density:  Lipid panel:  Allergies  Allergen Reactions  . Tape Other (See Comments)    Blisters on skin from tape during recent surgery.    Current Outpatient Prescriptions  Medication Sig Dispense Refill  . aspirin 81 MG tablet Take 81 mg by mouth daily.    . calcium carbonate (OS-CAL) 600 MG TABS tablet Take 600 mg by mouth daily.    . cholecalciferol (VITAMIN D) 1000 UNITS tablet Take 1,000 Units by mouth daily.    . fluticasone (FLONASE) 50 MCG/ACT nasal spray Place 1 spray into both nostrils daily. 16 g 2  . gentamicin ointment (GARAMYCIN) 0.1 %     . hydrochlorothiazide (HYDRODIURIL) 25 MG tablet     . lansoprazole (PREVACID) 15 MG capsule Take 15 mg by mouth every morning.     . lidocaine-prilocaine (EMLA) cream Apply 1 application topically as needed. 30 g 3  . meloxicam (MOBIC) 15 MG tablet 15 mg every morning.     . metoprolol succinate (TOPROL-XL) 50 MG 24 hr tablet Take 50 mg by mouth every morning. Take with or immediately following a meal.    . Multiple Vitamins-Minerals (MULTIVITAMIN) tablet Take 1 tablet by mouth daily.    . mupirocin ointment (BACTROBAN) 2 %     . ondansetron (ZOFRAN) 8 MG tablet Take 1 tablet (8 mg total) by mouth 2 (two) times daily. Start the day after chemo for 2 days. Then take as needed for nausea or vomiting. 30 tablet 1  . Oxycodone HCl 10 MG TABS Take 1 tablet (10 mg total) by mouth every 6 (six) hours as needed. 30 tablet 0  . PARoxetine (PAXIL) 40 MG tablet Take 40 mg by mouth at bedtime.     . simvastatin (ZOCOR) 40 MG tablet Take 40 mg by mouth at bedtime.     . traMADol (ULTRAM) 50 MG tablet Take by mouth every 6 (six) hours as needed.    .  ciprofloxacin (CIPRO) 500 MG tablet     . levofloxacin (LEVAQUIN) 500 MG tablet Take 1 tablet (500 mg total) by mouth daily. (Patient not taking: Reported on 11/25/2014) 5 tablet 0  . loratadine (CLARITIN) 10 MG tablet TAKE ONE TABLET BY MOUTH ONCE DAILY (Patient not taking: Reported on 11/25/2014) 30 tablet 0   Current Facility-Administered Medications  Medication Dose Route Frequency Provider Last Rate Last Dose  . sodium chloride 0.9 % 500 mL with potassium chloride 20 mEq, magnesium sulfate 2 g infusion   Intravenous Once Evlyn Kanner, NP       Facility-Administered Medications Ordered in Other Visits  Medication Dose Route Frequency Provider Last Rate Last Dose  . heparin lock flush 100 unit/mL  500 Units Intravenous Once Forest Gleason, MD      . sodium chloride 0.9 % injection 10 mL  10 mL Intravenous PRN Forest Gleason, MD   10 mL at 11/25/14 [phone]  OBJECTIVE: BP 150/82 mmHg  Pulse 82  Temp(Src) 96.6 F (35.9 C) (Tympanic)  Wt 290 lb 4 oz (131.657 kg)   Body mass index is 49.8 kg/(m^2).    ECOG FS:1 - Symptomatic but completely ambulatory  General: Well-developed, well-nourished, no acute distress. Eyes: Pink conjunctiva, anicteric sclera. HEENT: Normocephalic, moist mucous membranes, clear oropharnyx. Lungs: Clear to auscultation bilaterally. Heart: Regular rate and rhythm. No rubs, murmurs, or gallops. Abdomen: Soft, nontender, nondistended. No organomegaly noted, normoactive bowel sounds.  Musculoskeletal: No edema, cyanosis, or clubbing. Neuro: Alert, answering all questions appropriately. Cranial nerves grossly intact. Skin: No rashes or petechiae noted. Psych: Normal affect. Lymphatics: No cervical, calvicular, axillary or inguinal LAD.   LAB RESULTS:  Infusion on 11/25/2014  Component Date Value Ref Range Status  . WBC 11/25/2014 3.0* 3.6 - 11.0 K/uL Final   A-LINE DRAW  . RBC 11/25/2014 3.66* 3.80 - 5.20 MIL/uL Final  . Hemoglobin 11/25/2014 11.5* 12.0 - 16.0  g/dL Final  . HCT 11/25/2014 33.4* 35.0 - 47.0 % Final  . MCV 11/25/2014 91.2  80.0 - 100.0 fL Final  . MCH 11/25/2014 31.4  26.0 - 34.0 pg Final  . MCHC 11/25/2014 34.4  32.0 - 36.0 g/dL Final  . RDW 11/25/2014 15.8* 11.5 - 14.5 % Final  . Platelets 11/25/2014 171  150 - 440 K/uL Final  . Neutrophils Relative % 11/25/2014 47   Final  . Neutro Abs 11/25/2014 1.4  1.4 - 6.5 K/uL Final  . Lymphocytes Relative 11/25/2014 45   Final  . Lymphs Abs 11/25/2014 1.4  1.0 - 3.6 K/uL Final  . Monocytes Relative 11/25/2014 5   Final  . Monocytes Absolute 11/25/2014 0.1* 0.2 - 0.9 K/uL Final  . Eosinophils Relative 11/25/2014 2   Final  . Eosinophils Absolute 11/25/2014 0.1  0 - 0.7 K/uL Final  . Basophils Relative 11/25/2014 1   Final  . Basophils Absolute 11/25/2014 0.0  0 - 0.1 K/uL Final  . Sodium 11/25/2014 138  135 - 145 mmol/L Final  . Potassium 11/25/2014 3.3* 3.5 - 5.1 mmol/L Final  . Chloride 11/25/2014 103  101 - 111 mmol/L Final  . CO2 11/25/2014 25  22 - 32 mmol/L Final  . Glucose, Bld 11/25/2014 115* 65 - 99 mg/dL Final  . BUN 11/25/2014 21* 6 - 20 mg/dL Final  . Creatinine, Ser 11/25/2014 1.20* 0.44 - 1.00 mg/dL Final  . Calcium 11/25/2014 8.9  8.9 - 10.3 mg/dL Final  . Total Protein 11/25/2014 6.5  6.5 - 8.1 g/dL Final  . Albumin 11/25/2014 3.8  3.5 - 5.0 g/dL Final  . AST 11/25/2014 34  15 - 41 U/L Final  . ALT 11/25/2014 22  14 - 54 U/L Final  . Alkaline Phosphatase 11/25/2014 70  38 - 126 U/L Final  . Total Bilirubin 11/25/2014 1.4* 0.3 - 1.2 mg/dL Final  . GFR calc non Af Amer 11/25/2014 44* >60 mL/min Final  . GFR calc Af Amer 11/25/2014 51* >60 mL/min Final   Comment: (NOTE) The eGFR has been calculated using the CKD EPI equation. This calculation has not been validated in all clinical situations. eGFR's persistently <60 mL/min signify possible Chronic Kidney Disease.   . Anion gap 11/25/2014 10  5 - 15 Final  . Magnesium 11/25/2014 1.5* 1.7 - 2.4 mg/dL Final     STUDIES: No results found.  ASSESSMENT:  1. Carcinoma of breast, stage IB. Sentinel lymph nodes negative, Estrogen/ Progesterone receptor positive, HER-2/neu receptor and overexpressed.  PLAN:  1. Breast Cancer. Lab data has been reviewed, Will proceed with cycle 11 Taxol chemotherapy. Patient should return to the office in 1 week for cycle 12 chemotherapy and again in 2 weeks, October 11th to see Dr. Oliva Bustard and for her next cycle of Herceptin.  2. Electrolyte deficits. Magnesium 1.5 and potassium 3.3. Will supplement with 74mQ of potassium and 2 grams of magnesium today with a half liter of IVF. Patient instructed to continue with potassium rich foods as well as insure appropriate water intake.   Patient expressed understanding and was in agreement with this plan. She also understands that She can call clinic at any time with any questions, concerns, or complaints.   Dr. COliva Bustardwas available for consultation and review of plan of care for this patient.  Invasive ductal carcinoma of breast, stage 1   Staging form: Breast, AJCC 7th Edition     Clinical: Stage IA (T1b, N0, M0) - Signed by JForest Gleason MD on 07/28/2014   LEvlyn Kanner NP   11/25/2014 9:46 AM

## 2014-12-02 ENCOUNTER — Inpatient Hospital Stay: Payer: Medicare HMO | Attending: Oncology

## 2014-12-02 ENCOUNTER — Inpatient Hospital Stay: Payer: Medicare HMO

## 2014-12-02 ENCOUNTER — Other Ambulatory Visit: Payer: Self-pay | Admitting: Oncology

## 2014-12-02 DIAGNOSIS — Z23 Encounter for immunization: Secondary | ICD-10-CM | POA: Diagnosis not present

## 2014-12-02 DIAGNOSIS — Z5111 Encounter for antineoplastic chemotherapy: Secondary | ICD-10-CM | POA: Diagnosis not present

## 2014-12-02 DIAGNOSIS — K219 Gastro-esophageal reflux disease without esophagitis: Secondary | ICD-10-CM | POA: Diagnosis not present

## 2014-12-02 DIAGNOSIS — R5383 Other fatigue: Secondary | ICD-10-CM | POA: Insufficient documentation

## 2014-12-02 DIAGNOSIS — R531 Weakness: Secondary | ICD-10-CM | POA: Insufficient documentation

## 2014-12-02 DIAGNOSIS — R0602 Shortness of breath: Secondary | ICD-10-CM | POA: Insufficient documentation

## 2014-12-02 DIAGNOSIS — I10 Essential (primary) hypertension: Secondary | ICD-10-CM | POA: Diagnosis not present

## 2014-12-02 DIAGNOSIS — Z79899 Other long term (current) drug therapy: Secondary | ICD-10-CM | POA: Insufficient documentation

## 2014-12-02 DIAGNOSIS — E785 Hyperlipidemia, unspecified: Secondary | ICD-10-CM | POA: Diagnosis not present

## 2014-12-02 DIAGNOSIS — Z170 Estrogen receptor positive status [ER+]: Secondary | ICD-10-CM | POA: Diagnosis not present

## 2014-12-02 DIAGNOSIS — F419 Anxiety disorder, unspecified: Secondary | ICD-10-CM | POA: Diagnosis not present

## 2014-12-02 DIAGNOSIS — N281 Cyst of kidney, acquired: Secondary | ICD-10-CM | POA: Insufficient documentation

## 2014-12-02 DIAGNOSIS — Z7982 Long term (current) use of aspirin: Secondary | ICD-10-CM | POA: Insufficient documentation

## 2014-12-02 DIAGNOSIS — C50911 Malignant neoplasm of unspecified site of right female breast: Secondary | ICD-10-CM

## 2014-12-02 DIAGNOSIS — C50411 Malignant neoplasm of upper-outer quadrant of right female breast: Secondary | ICD-10-CM | POA: Insufficient documentation

## 2014-12-02 LAB — CBC WITH DIFFERENTIAL/PLATELET
BASOS ABS: 0 10*3/uL (ref 0–0.1)
BASOS PCT: 1 %
EOS ABS: 0 10*3/uL (ref 0–0.7)
EOS PCT: 2 %
HEMATOCRIT: 33 % — AB (ref 35.0–47.0)
Hemoglobin: 11.2 g/dL — ABNORMAL LOW (ref 12.0–16.0)
Lymphocytes Relative: 51 %
Lymphs Abs: 1.2 10*3/uL (ref 1.0–3.6)
MCH: 31.4 pg (ref 26.0–34.0)
MCHC: 34.1 g/dL (ref 32.0–36.0)
MCV: 92.1 fL (ref 80.0–100.0)
MONO ABS: 0.1 10*3/uL — AB (ref 0.2–0.9)
MONOS PCT: 6 %
Neutro Abs: 0.9 10*3/uL — ABNORMAL LOW (ref 1.4–6.5)
Neutrophils Relative %: 40 %
PLATELETS: 181 10*3/uL (ref 150–440)
RBC: 3.58 MIL/uL — ABNORMAL LOW (ref 3.80–5.20)
RDW: 16.4 % — AB (ref 11.5–14.5)
WBC: 2.3 10*3/uL — ABNORMAL LOW (ref 3.6–11.0)

## 2014-12-02 MED ORDER — SODIUM CHLORIDE 0.9 % IV SOLN
Freq: Once | INTRAVENOUS | Status: AC
  Administered 2014-12-02: 11:00:00 via INTRAVENOUS
  Filled 2014-12-02: qty 250

## 2014-12-02 MED ORDER — SODIUM CHLORIDE 0.9 % IJ SOLN
10.0000 mL | INTRAMUSCULAR | Status: DC | PRN
  Administered 2014-12-02: 10 mL via INTRAVENOUS
  Filled 2014-12-02: qty 10

## 2014-12-02 MED ORDER — HEPARIN SOD (PORK) LOCK FLUSH 100 UNIT/ML IV SOLN
500.0000 [IU] | Freq: Once | INTRAVENOUS | Status: AC
  Administered 2014-12-02: 500 [IU] via INTRAVENOUS
  Filled 2014-12-02: qty 5

## 2014-12-03 ENCOUNTER — Inpatient Hospital Stay: Payer: Medicare HMO

## 2014-12-03 ENCOUNTER — Encounter: Payer: Self-pay | Admitting: Family Medicine

## 2014-12-03 ENCOUNTER — Ambulatory Visit
Admission: RE | Admit: 2014-12-03 | Discharge: 2014-12-03 | Disposition: A | Discharge status: Home / Self Care | Payer: Medicare HMO | Source: Ambulatory Visit | Attending: Family Medicine | Admitting: Family Medicine

## 2014-12-03 ENCOUNTER — Inpatient Hospital Stay (HOSPITAL_BASED_OUTPATIENT_CLINIC_OR_DEPARTMENT_OTHER): Payer: Medicare HMO | Admitting: Family Medicine

## 2014-12-03 ENCOUNTER — Telehealth: Payer: Self-pay | Admitting: *Deleted

## 2014-12-03 VITALS — BP 108/71 | HR 71 | Temp 96.6°F | Ht 64.0 in | Wt 290.1 lb

## 2014-12-03 DIAGNOSIS — Z79899 Other long term (current) drug therapy: Secondary | ICD-10-CM

## 2014-12-03 DIAGNOSIS — C50911 Malignant neoplasm of unspecified site of right female breast: Secondary | ICD-10-CM

## 2014-12-03 DIAGNOSIS — N281 Cyst of kidney, acquired: Secondary | ICD-10-CM | POA: Diagnosis not present

## 2014-12-03 DIAGNOSIS — Z853 Personal history of malignant neoplasm of breast: Secondary | ICD-10-CM | POA: Diagnosis not present

## 2014-12-03 DIAGNOSIS — I2510 Atherosclerotic heart disease of native coronary artery without angina pectoris: Secondary | ICD-10-CM | POA: Diagnosis not present

## 2014-12-03 DIAGNOSIS — Z170 Estrogen receptor positive status [ER+]: Secondary | ICD-10-CM

## 2014-12-03 DIAGNOSIS — R0609 Other forms of dyspnea: Secondary | ICD-10-CM | POA: Insufficient documentation

## 2014-12-03 DIAGNOSIS — I517 Cardiomegaly: Secondary | ICD-10-CM | POA: Diagnosis not present

## 2014-12-03 DIAGNOSIS — R0602 Shortness of breath: Secondary | ICD-10-CM | POA: Insufficient documentation

## 2014-12-03 DIAGNOSIS — C50411 Malignant neoplasm of upper-outer quadrant of right female breast: Secondary | ICD-10-CM

## 2014-12-03 DIAGNOSIS — R531 Weakness: Secondary | ICD-10-CM | POA: Diagnosis not present

## 2014-12-03 DIAGNOSIS — C50511 Malignant neoplasm of lower-outer quadrant of right female breast: Secondary | ICD-10-CM

## 2014-12-03 DIAGNOSIS — Z5111 Encounter for antineoplastic chemotherapy: Secondary | ICD-10-CM | POA: Diagnosis not present

## 2014-12-03 HISTORY — DX: Shortness of breath: R06.02

## 2014-12-03 LAB — CBC WITH DIFFERENTIAL/PLATELET
BASOS ABS: 0 10*3/uL (ref 0–0.1)
Basophils Relative: 2 %
Eosinophils Absolute: 0.1 10*3/uL (ref 0–0.7)
Eosinophils Relative: 2 %
HEMATOCRIT: 33.5 % — AB (ref 35.0–47.0)
HEMOGLOBIN: 11.4 g/dL — AB (ref 12.0–16.0)
LYMPHS PCT: 53 %
Lymphs Abs: 1.5 10*3/uL (ref 1.0–3.6)
MCH: 31.5 pg (ref 26.0–34.0)
MCHC: 34.2 g/dL (ref 32.0–36.0)
MCV: 92.1 fL (ref 80.0–100.0)
MONO ABS: 0.2 10*3/uL (ref 0.2–0.9)
MONOS PCT: 9 %
NEUTROS ABS: 1 10*3/uL — AB (ref 1.4–6.5)
Neutrophils Relative %: 34 %
Platelets: 214 10*3/uL (ref 150–440)
RBC: 3.64 MIL/uL — ABNORMAL LOW (ref 3.80–5.20)
RDW: 16.3 % — AB (ref 11.5–14.5)
WBC: 2.8 10*3/uL — ABNORMAL LOW (ref 3.6–11.0)

## 2014-12-03 LAB — COMPREHENSIVE METABOLIC PANEL
ALK PHOS: 67 U/L (ref 38–126)
ALT: 21 U/L (ref 14–54)
AST: 32 U/L (ref 15–41)
Albumin: 3.7 g/dL (ref 3.5–5.0)
Anion gap: 5 (ref 5–15)
BILIRUBIN TOTAL: 1.1 mg/dL (ref 0.3–1.2)
BUN: 22 mg/dL — AB (ref 6–20)
CALCIUM: 8 mg/dL — AB (ref 8.9–10.3)
CO2: 23 mmol/L (ref 22–32)
CREATININE: 1.12 mg/dL — AB (ref 0.44–1.00)
Chloride: 103 mmol/L (ref 101–111)
GFR calc Af Amer: 55 mL/min — ABNORMAL LOW (ref >60)
GFR, EST NON AFRICAN AMERICAN: 48 mL/min — AB (ref >60)
Glucose, Bld: 115 mg/dL — ABNORMAL HIGH (ref 65–99)
POTASSIUM: 3.3 mmol/L — AB (ref 3.5–5.1)
Sodium: 131 mmol/L — ABNORMAL LOW (ref 135–145)
TOTAL PROTEIN: 6.6 g/dL (ref 6.5–8.1)

## 2014-12-03 MED ORDER — IOHEXOL 350 MG/ML SOLN
100.0000 mL | Freq: Once | INTRAVENOUS | Status: AC | PRN
  Administered 2014-12-03: 100 mL via INTRAVENOUS

## 2014-12-03 NOTE — Telephone Encounter (Signed)
Patient called and states "I feel horrible."  States she cannot walk from one room to the next without sitting down.  States she is short of breath and extremely weak.  Discussed case with Dr. Oliva Bustard.  Patient is to come in and see Georgeanne Nim, NP.  For CBC and MetC per Dr. Oliva Bustard.

## 2014-12-03 NOTE — Progress Notes (Signed)
Patient here today for sick visit. States she feels extremely fatigued and short of breath with any kind of ambulation for the past week.Taking immodium for loose stools.

## 2014-12-04 ENCOUNTER — Telehealth: Payer: Self-pay | Admitting: *Deleted

## 2014-12-04 ENCOUNTER — Inpatient Hospital Stay: Payer: Medicare HMO

## 2014-12-04 ENCOUNTER — Inpatient Hospital Stay (HOSPITAL_BASED_OUTPATIENT_CLINIC_OR_DEPARTMENT_OTHER): Payer: Medicare HMO | Admitting: Family Medicine

## 2014-12-04 VITALS — BP 115/71 | HR 63 | Temp 95.8°F | Resp 18 | Wt 289.9 lb

## 2014-12-04 DIAGNOSIS — C50919 Malignant neoplasm of unspecified site of unspecified female breast: Secondary | ICD-10-CM

## 2014-12-04 DIAGNOSIS — Z79899 Other long term (current) drug therapy: Secondary | ICD-10-CM

## 2014-12-04 DIAGNOSIS — C50411 Malignant neoplasm of upper-outer quadrant of right female breast: Secondary | ICD-10-CM

## 2014-12-04 DIAGNOSIS — R5383 Other fatigue: Secondary | ICD-10-CM | POA: Diagnosis not present

## 2014-12-04 DIAGNOSIS — Z170 Estrogen receptor positive status [ER+]: Secondary | ICD-10-CM | POA: Diagnosis not present

## 2014-12-04 DIAGNOSIS — N281 Cyst of kidney, acquired: Secondary | ICD-10-CM

## 2014-12-04 DIAGNOSIS — Z5111 Encounter for antineoplastic chemotherapy: Secondary | ICD-10-CM | POA: Diagnosis not present

## 2014-12-04 MED ORDER — SODIUM CHLORIDE 0.9 % IJ SOLN
10.0000 mL | Freq: Once | INTRAMUSCULAR | Status: AC
  Administered 2014-12-04: 10 mL via INTRAVENOUS
  Filled 2014-12-04: qty 10

## 2014-12-04 MED ORDER — SODIUM CHLORIDE 0.9 % IV SOLN
10.0000 mg | Freq: Once | INTRAVENOUS | Status: AC
  Administered 2014-12-04: 10 mg via INTRAVENOUS
  Filled 2014-12-04: qty 1

## 2014-12-04 MED ORDER — SODIUM CHLORIDE 0.9 % IV SOLN
Freq: Once | INTRAVENOUS | Status: AC
  Administered 2014-12-04: 12:00:00 via INTRAVENOUS
  Filled 2014-12-04: qty 1000

## 2014-12-04 MED ORDER — HEPARIN SOD (PORK) LOCK FLUSH 100 UNIT/ML IV SOLN
500.0000 [IU] | Freq: Once | INTRAVENOUS | Status: AC
  Administered 2014-12-04: 500 [IU] via INTRAVENOUS
  Filled 2014-12-04: qty 5

## 2014-12-04 MED ORDER — DEXAMETHASONE SODIUM PHOSPHATE 10 MG/ML IJ SOLN: 10.0000 mg | Freq: Once | INTRAMUSCULAR | Status: DC

## 2014-12-04 NOTE — Progress Notes (Signed)
[facility]  [phone]  [phone]     Maria Gutierrez DOB: 05-01-1940  MR#: [id number]  ZDG#:[id number]  Patient Care Team: [name], MD as PCP - General (Family Medicine) [name], MD as Referring Physician (Family Medicine) Robert Bellow, MD (General Surgery)  CHIEF COMPLAINT:  Chief Complaint  Patient presents with  . Shortness of Breath    secondary to chemo therapy    INTERVAL HISTORY:  Patient is here for further follow-up and evaluation regarding increasing shortness of breath and weakness with fatigue secondary to chemotherapy. Patient was last seen in clinic on October 4 when she was to receive cycle 12 of Taxol. Taxol was held at that time he [place] of [place]. She is here as an acute add on today due to progressing weakness and increasing shortness of breath.  REVIEW OF SYSTEMS:   Review of Systems  Constitutional: Positive for malaise/fatigue. Negative for fever, chills, weight loss and diaphoresis.  HENT: Negative for congestion, ear discharge, ear pain, hearing loss, nosebleeds, sore throat and tinnitus.   Eyes: Negative for blurred vision, double vision, photophobia, pain, discharge and redness.  Respiratory: Positive for shortness of breath. Negative for cough, hemoptysis, sputum production, wheezing and stridor.   Cardiovascular: Negative for chest pain, palpitations, orthopnea, claudication, leg swelling and PND.  Gastrointestinal: Negative for heartburn, nausea, vomiting, abdominal pain, diarrhea, constipation, blood in stool and melena.  Genitourinary: Negative.   Musculoskeletal: Negative.   Skin: Negative.   Neurological: Positive for weakness. Negative for dizziness, tingling, focal weakness, seizures and headaches.  Endo/Heme/Allergies: Does not bruise/bleed easily.  Psychiatric/Behavioral: Negative for depression. The patient is not nervous/anxious and does not have insomnia.     As per HPI. Otherwise, a complete  review of systems is negatve.  ONCOLOGY HISTORY: Oncology History   1.  Carcinoma of right breast.  Status post lumpectomy and sentinel lymph node evaluation.  Tumor size 1 cm.  Estrogen and progesterone receptor positive.  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  Diagnosis in April of 2016 2.  Accelerated partial breast radiation in June of 2016 3.  Weekly Taxol and Herceptin therapy for adjuvant treatment starting from July of 2016     Invasive ductal carcinoma of breast, stage 1 ([place])   06/17/2014 Initial Diagnosis Invasive ductal carcinoma of breast, stage 1    PAST MEDICAL HISTORY: Past Medical History  Diagnosis Date  . Hyperlipidemia   . Hypertension   . GERD (gastroesophageal reflux disease)   . Arthritis   . Heart murmur   . Anxiety   . Breast cancer of upper-outer quadrant of right female breast ([facility]) 06/10/2014    T1b,Nx; ER 90%, PR 50-90%, HER-2/neu 2+, FISH positive.  . SOB (shortness of breath) 12/03/2014    PAST SURGICAL HISTORY: Past Surgical History  Procedure Laterality Date  . Abdominal hysterectomy    . [place] surgery Left   . Cholecystectomy  2012  . Colonoscopy  2009  . Axillary lymph node biopsy Right 07/14/2014    Procedure: AXILLARY LYMPH NODE BIOPSY;  Surgeon: Robert Bellow, MD;  Location: [facility] ORS;  Service: General;  Laterality: Right;  . Breast biopsy Left 2015  . 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  . Eye surgery Bilateral 2014  . Joint replacement Left 2001  . Joint replacement Right 2003  . Joint replacement Left 2004    replacement joint broken  .  Portacath placement Left 08/27/2014    Procedure: INSERTION PORT-A-CATH;  Surgeon: Robert Bellow, MD;  Location: [facility] ORS;  Service: General;  Laterality: Left;    FAMILY HISTORY No family history on file.  GYNECOLOGIC HISTORY:  No LMP recorded. Patient has had a hysterectomy.      ADVANCED DIRECTIVES:    HEALTH MAINTENANCE: Social History  Substance Use Topics  . Smoking status: Never Smoker   . Smokeless tobacco: Never Used  . Alcohol Use: No     Colonoscopy:  PAP:  Bone density:  Lipid panel:  Allergies  Allergen Reactions  . Tape Other (See Comments)    Blisters on skin from tape during recent surgery.    Current Outpatient Prescriptions  Medication Sig Dispense Refill  . aspirin 81 MG tablet Take 81 mg by mouth daily.    . calcium carbonate (OS-CAL) 600 MG TABS tablet Take 600 mg by mouth daily.    . cholecalciferol (VITAMIN D) 1000 UNITS tablet Take 1,000 Units by mouth daily.    . fluticasone (FLONASE) 50 MCG/ACT nasal spray Place 1 spray into both nostrils daily. 16 g 2  . gentamicin ointment (GARAMYCIN) 0.1 %     . hydrochlorothiazide (HYDRODIURIL) 25 MG tablet     . lansoprazole (PREVACID) 15 MG capsule Take 15 mg by mouth every morning.     . lidocaine-prilocaine (EMLA) cream Apply 1 application topically as needed. 30 g 3  . loratadine (CLARITIN) 10 MG tablet TAKE ONE TABLET BY MOUTH ONCE DAILY 30 tablet 0  . meloxicam (MOBIC) 15 MG tablet 15 mg every morning.     . metoprolol succinate (TOPROL-XL) 50 MG 24 hr tablet Take 50 mg by mouth every morning. Take with or immediately following a meal.    . Multiple Vitamins-Minerals (MULTIVITAMIN) tablet Take 1 tablet by mouth daily.    . mupirocin ointment (BACTROBAN) 2 %     . ondansetron (ZOFRAN) 8 MG tablet Take 1 tablet (8 mg total) by mouth 2 (two) times daily. Start the day after chemo for 2 days. Then take as needed for nausea or vomiting. 30 tablet 1  . Oxycodone HCl 10 MG TABS Take 1 tablet (10 mg total) by mouth every 6 (six) hours as needed. 30 tablet 0  . PARoxetine (PAXIL) 40 MG tablet Take 40 mg by mouth at bedtime.     . simvastatin (ZOCOR) 40 MG tablet Take 40 mg by mouth at bedtime.     . traMADol (ULTRAM) 50 MG tablet Take by mouth every 6 (six) hours as needed.     No  current facility-administered medications for this visit.    OBJECTIVE: BP 108/71 mmHg  Pulse 71  Temp(Src) 96.6 F (35.9 C) (Tympanic)  Ht '5\' 4"'  (1.626 m)  Wt 290 lb 2 oz (131.6 kg)  BMI 49.78 kg/m2  SpO2 95%   Body mass index is 49.78 kg/(m^2).    ECOG FS:2 - Symptomatic, <50% confined to bed  General: Well-developed, well-nourished, no acute distress. Obese, overall general weakness. Eyes: Pink conjunctiva, anicteric sclera. HEENT: Normocephalic, moist mucous membranes, clear oropharnyx. Lungs: Clear to auscultation bilaterally. Shortness of breath with exertion. Heart: Regular rate and rhythm. No rubs, murmurs, or gallops. Abdomen: Soft, nontender, nondistended. No organomegaly noted, normoactive bowel sounds. Musculoskeletal: No edema, cyanosis, or clubbing. Neuro: Alert, answering all questions appropriately. Cranial nerves grossly intact. Skin: No rashes or petechiae noted. Psych: Normal affect.    LAB RESULTS:  Clinical Support on 12/03/2014  Component Date Value  Ref Range Status  . WBC 12/03/2014 2.8* 3.6 - 11.0 K/uL Final  . RBC 12/03/2014 3.64* 3.80 - 5.20 MIL/uL Final  . Hemoglobin 12/03/2014 11.4* 12.0 - 16.0 g/dL Final  . HCT 12/03/2014 33.5* 35.0 - 47.0 % Final  . MCV 12/03/2014 92.1  80.0 - 100.0 fL Final  . MCH 12/03/2014 31.5  26.0 - 34.0 pg Final  . MCHC 12/03/2014 34.2  32.0 - 36.0 g/dL Final  . RDW 12/03/2014 16.3* 11.5 - 14.5 % Final  . Platelets 12/03/2014 214  150 - 440 K/uL Final  . Neutrophils Relative % 12/03/2014 34   Final  . Neutro Abs 12/03/2014 1.0* 1.4 - 6.5 K/uL Final  . Lymphocytes Relative 12/03/2014 53   Final  . Lymphs Abs 12/03/2014 1.5  1.0 - 3.6 K/uL Final  . Monocytes Relative 12/03/2014 9   Final  . Monocytes Absolute 12/03/2014 0.2  0.2 - 0.9 K/uL Final  . Eosinophils Relative 12/03/2014 2   Final  . Eosinophils Absolute 12/03/2014 0.1  0 - 0.7 K/uL Final  . Basophils Relative 12/03/2014 2   Final  . Basophils Absolute  12/03/2014 0.0  0 - 0.1 K/uL Final  . Sodium 12/03/2014 131* 135 - 145 mmol/L Final  . Potassium 12/03/2014 3.3* 3.5 - 5.1 mmol/L Final  . Chloride 12/03/2014 103  101 - 111 mmol/L Final  . CO2 12/03/2014 23  22 - 32 mmol/L Final  . Glucose, Bld 12/03/2014 115* 65 - 99 mg/dL Final  . BUN 12/03/2014 22* 6 - 20 mg/dL Final  . Creatinine, Ser 12/03/2014 1.12* 0.44 - 1.00 mg/dL Final  . Calcium 12/03/2014 8.0* 8.9 - 10.3 mg/dL Final  . Total Protein 12/03/2014 6.6  6.5 - 8.1 g/dL Final  . Albumin 12/03/2014 3.7  3.5 - 5.0 g/dL Final  . AST 12/03/2014 32  15 - 41 U/L Final  . ALT 12/03/2014 21  14 - 54 U/L Final  . Alkaline Phosphatase 12/03/2014 67  38 - 126 U/L Final  . Total Bilirubin 12/03/2014 1.1  0.3 - 1.2 mg/dL Final  . GFR calc non Af Amer 12/03/2014 48* >60 mL/min Final  . GFR calc Af Amer 12/03/2014 55* >60 mL/min Final   Comment: (NOTE) The eGFR has been calculated using the CKD EPI equation. This calculation has not been validated in all clinical situations. eGFR's persistently <60 mL/min signify possible Chronic Kidney Disease.   . Anion gap 12/03/2014 5  5 - 15 Final  Infusion on 12/02/2014  Component Date Value Ref Range Status  . WBC 12/02/2014 2.3* 3.6 - 11.0 K/uL Final  . RBC 12/02/2014 3.58* 3.80 - 5.20 MIL/uL Final  . Hemoglobin 12/02/2014 11.2* 12.0 - 16.0 g/dL Final  . HCT 12/02/2014 33.0* 35.0 - 47.0 % Final  . MCV 12/02/2014 92.1  80.0 - 100.0 fL Final  . MCH 12/02/2014 31.4  26.0 - 34.0 pg Final  . MCHC 12/02/2014 34.1  32.0 - 36.0 g/dL Final  . RDW 12/02/2014 16.4* 11.5 - 14.5 % Final  . Platelets 12/02/2014 181  150 - 440 K/uL Final  . Neutrophils Relative % 12/02/2014 40   Final  . Neutro Abs 12/02/2014 0.9* 1.4 - 6.5 K/uL Final  . Lymphocytes Relative 12/02/2014 51   Final  . Lymphs Abs 12/02/2014 1.2  1.0 - 3.6 K/uL Final  . Monocytes Relative 12/02/2014 6   Final  . Monocytes Absolute 12/02/2014 0.1* 0.2 - 0.9 K/uL Final  . Eosinophils Relative  12/02/2014 2  Final  . Eosinophils Absolute 12/02/2014 0.0  0 - 0.7 K/uL Final  . Basophils Relative 12/02/2014 1   Final  . Basophils Absolute 12/02/2014 0.0  0 - 0.1 K/uL Final    STUDIES: Ct Angio Chest Pe W/cm &/or Wo Cm  12/03/2014   CLINICAL DATA:  Shortness of breath for 3 days. History of breast cancer, currently undergoing chemotherapy. Evaluate for pulmonary embolism.  EXAM: CT ANGIOGRAPHY CHEST WITH CONTRAST  TECHNIQUE: Multidetector CT imaging of the chest was performed using the standard protocol during bolus administration of intravenous contrast. Multiplanar CT image reconstructions and MIPs were obtained to evaluate the vascular anatomy.  CONTRAST:  112m OMNIPAQUE IOHEXOL 350 MG/ML SOLN  COMPARISON:  Chest radiograph - 08/27/2014  FINDINGS: Vascular Findings:  There is suboptimal opacification of the pulmonary arterial system with the main pulmonary artery measuring only 212 Hounsfield units. There are no discrete filling defects within the central pulmonary arterial tree to the level of the bilateral lobar pulmonary arteries. Evaluation of the lobar, segmental and subsegmental pulmonary arteries is degraded secondary to a combination of suboptimal vessel opacification, patient respiratory artifact as well as quantum mottle artifact due to patient body habitus. Normal caliber of the main pulmonary artery.  Borderline cardiomegaly. No pericardial effusion. Scattered minimal coronary artery calcifications. Scattered atherosclerotic plaque with a normal caliber thoracic aorta. Bovine configuration of the aortic arch. The branch vessels of the aortic arch appear widely patent throughout their imaged course.  Left subclavian vein approach port a catheter tip terminates within knee superior cavoatrial junction.  Review of the MIP images confirms the above findings.   ----------------------------------------------------------------------------------  Nonvascular Findings:  Evaluation of the  pulmonary parenchyma is markedly degraded secondary to a combination of patient respiratory artifact as well as quantum mottle artifact due to patient body habitus. Bibasilar heterogeneous opacities are favored to represent atelectasis. No discrete focal airspace opacities. No pleural effusion pneumothorax. The central pulmonary airways appear widely patent.  No discrete pulmonary nodules given the limitations of the examination.  Shotty mediastinal lymph nodes are individually not enlarged by size criteria with index right paratracheal lymph node measuring 1 cm in greatest short axis diameter (image 44, series 4) and index left infrahilar lymph node measuring 0.8 cm (image 58). No definitive mediastinal, hilar or axillary lymphadenopathy.  Limited early arterial phase evaluation of the upper abdomen demonstrates a large (at least 10 x 8.1 cm) hypo attenuating (12 Hounsfield unit) presumed cyst arising from the superior pole of the left kidney though the caudal aspect of this presumed cyst was not imaged on this examination. Post cholecystectomy.  No acute or aggressive osseous abnormalities.  IMPRESSION: 1. No acute cardiopulmonary disease. Specifically, no evidence of central pulmonary embolism. There is suboptimal evaluation of the bilateral lobar, segmental and subsegmental pulmonary arteries secondary to multifactorial examination degradation as detailed above. If clinical concern persists for pulmonary embolism, further evaluation could be performed with bilateral lower extremity venous Doppler ultrasound and/or nuclear medicine V/Q scan as clinically indicated. 2. Borderline cardiomegaly. 3. Atherosclerosis including coronary artery calcifications. 4. Large (at least 10 cm) presumed exophytic left-sided renal cyst, incompletely imaged.   Electronically Signed   By: JSandi MariscalM.D.   On: 12/03/2014 18:02    ASSESSMENT:  Carcinoma breast, stage IB. Shortness of breath with exertion.  PLAN:   1.  Carcinoma of breast, stage IB. ER/PR positive, HER-2/neu positive. Per discussion with primary oncologist patient will not receive 12th and final cycle of Taxol. She will continue with  Herceptin as previously scheduled. 2. Shortness breath with exertion. CT chest to evaluate for PE has been performed and was found to be negative.patient subsequently noted to have borderline cardiomegaly, as well as a large left-sided renal cyst.   We'll continue with follow-up on October 6 to discuss CT scan results.  Patient expressed understanding and was in agreement with this plan. She also understands that She can call clinic at any time with any questions, concerns, or complaints.   Dr. Oliva Bustard was available for consultation and review of plan of care for this patient.  Invasive ductal carcinoma of breast, stage 1 ([facility])   Staging form: Breast, AJCC 7th Edition     Clinical: Stage IA (T1b, N0, M0) - Signed by Forest Gleason, MD on 07/28/2014   Evlyn Kanner, NP   12/03/2014 3:45 PM

## 2014-12-04 NOTE — Telephone Encounter (Signed)
Called patient to see how she was doing today.  States she is better today.  Encourage to call if she gets worse.  She is agreeable.

## 2014-12-05 NOTE — Progress Notes (Signed)
[place]  [phone]  [phone]     Maria Gutierrez DOB: 1940/07/15  MR#: [id number]  HWT#:[id number]  Patient Care Team: [name], MD as PCP - General (Family Medicine) [name], MD as Referring Physician (Family Medicine) Robert Bellow, MD (General Surgery)  CHIEF COMPLAINT:  Chief Complaint  Patient presents with  . Shortness of Breath  . Fatigue    INTERVAL HISTORY:  Patient is here for further follow-up and evaluation regarding increasing shortness of breath and weakness with fatigue secondary to chemotherapy. Patient was sent for CT scan of chest for evaluation of PE yesterday 12/03/2014. CT scan reported as negative for PE. Patient is back in office today for reevaluation of weakness and shortness of breath. She reports that shortness of breath has slightly improved as well as weakness. She was able to walk back and forth from bedroom to den this morning without having to rest.  REVIEW OF SYSTEMS:   Review of Systems  Constitutional: Positive for malaise/fatigue. Negative for fever, chills, weight loss and diaphoresis.  HENT: Negative for congestion, ear discharge, ear pain, hearing loss, nosebleeds, sore throat and tinnitus.   Eyes: Negative for blurred vision, double vision, photophobia, pain, discharge and redness.  Respiratory: Positive for shortness of breath. Negative for cough, hemoptysis, sputum production, wheezing and stridor.   Cardiovascular: Negative for chest pain, palpitations, orthopnea, claudication, leg swelling and PND.  Gastrointestinal: Negative for heartburn, nausea, vomiting, abdominal pain, diarrhea, constipation, blood in stool and melena.  Genitourinary: Negative.   Musculoskeletal: Negative.   Skin: Negative.   Neurological: Positive for weakness. Negative for dizziness, tingling, focal weakness, seizures and headaches.  Endo/Heme/Allergies: Does not bruise/bleed easily.  Psychiatric/Behavioral:  Negative for depression. The patient is not nervous/anxious and does not have insomnia.     As per HPI. Otherwise, a complete review of systems is negatve.  ONCOLOGY HISTORY: Oncology History   1.  Carcinoma of right breast.  Status post lumpectomy and sentinel lymph node evaluation.  Tumor size 1 cm.  Estrogen and progesterone receptor positive.  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  Diagnosis in April of 2016 2.  Accelerated partial breast radiation in June of 2016 3.  Weekly Taxol and Herceptin therapy for adjuvant treatment starting from July of 2016     Invasive ductal carcinoma of breast, stage 1 ([place])   06/17/2014 Initial Diagnosis Invasive ductal carcinoma of breast, stage 1    PAST MEDICAL HISTORY: Past Medical History  Diagnosis Date  . Hyperlipidemia   . Hypertension   . GERD (gastroesophageal reflux disease)   . Arthritis   . Heart murmur   . Anxiety   . Breast cancer of upper-outer quadrant of right female breast ([place]) 06/10/2014    T1b,Nx; ER 90%, PR 50-90%, HER-2/neu 2+, FISH positive.  . SOB (shortness of breath) 12/03/2014    PAST SURGICAL HISTORY: Past Surgical History  Procedure Laterality Date  . Abdominal hysterectomy    . [place] surgery Left   . Cholecystectomy  2012  . Colonoscopy  2009  . Axillary lymph node biopsy Right 07/14/2014    Procedure: AXILLARY LYMPH NODE BIOPSY;  Surgeon: Robert Bellow, MD;  Location: [facility] ORS;  Service: General;  Laterality: Right;  . Breast biopsy Left 2015  . 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  . Eye surgery Bilateral 2014  . Joint replacement Left 2001  . Joint  replacement Right 2003  . Joint replacement Left 2004    replacement joint broken  . Portacath placement Left 08/27/2014    Procedure: INSERTION PORT-A-CATH;  Surgeon: Robert Bellow, MD;  Location: [facility] ORS;  Service: General;  Laterality: Left;     FAMILY HISTORY No family history on file.  GYNECOLOGIC HISTORY:  No LMP recorded. Patient has had a hysterectomy.     ADVANCED DIRECTIVES:    HEALTH MAINTENANCE: Social History  Substance Use Topics  . Smoking status: Never Smoker   . Smokeless tobacco: Never Used  . Alcohol Use: No     Colonoscopy:  PAP:  Bone density:  Lipid panel:  Allergies  Allergen Reactions  . Tape Other (See Comments)    Blisters on skin from tape during recent surgery.    Current Outpatient Prescriptions  Medication Sig Dispense Refill  . aspirin 81 MG tablet Take 81 mg by mouth daily.    . calcium carbonate (OS-CAL) 600 MG TABS tablet Take 600 mg by mouth daily.    . cholecalciferol (VITAMIN D) 1000 UNITS tablet Take 1,000 Units by mouth daily.    . fluticasone (FLONASE) 50 MCG/ACT nasal spray Place 1 spray into both nostrils daily. 16 g 2  . gentamicin ointment (GARAMYCIN) 0.1 %     . hydrochlorothiazide (HYDRODIURIL) 25 MG tablet     . lansoprazole (PREVACID) 15 MG capsule Take 15 mg by mouth every morning.     . lidocaine-prilocaine (EMLA) cream Apply 1 application topically as needed. 30 g 3  . loratadine (CLARITIN) 10 MG tablet TAKE ONE TABLET BY MOUTH ONCE DAILY 30 tablet 0  . meloxicam (MOBIC) 15 MG tablet 15 mg every morning.     . metoprolol succinate (TOPROL-XL) 50 MG 24 hr tablet Take 50 mg by mouth every morning. Take with or immediately following a meal.    . Multiple Vitamins-Minerals (MULTIVITAMIN) tablet Take 1 tablet by mouth daily.    . mupirocin ointment (BACTROBAN) 2 %     . ondansetron (ZOFRAN) 8 MG tablet Take 1 tablet (8 mg total) by mouth 2 (two) times daily. Start the day after chemo for 2 days. Then take as needed for nausea or vomiting. 30 tablet 1  . Oxycodone HCl 10 MG TABS Take 1 tablet (10 mg total) by mouth every 6 (six) hours as needed. 30 tablet 0  . PARoxetine (PAXIL) 40 MG tablet Take 40 mg by mouth at bedtime.     . simvastatin (ZOCOR) 40 MG tablet  Take 40 mg by mouth at bedtime.     . traMADol (ULTRAM) 50 MG tablet Take by mouth every 6 (six) hours as needed.     No current facility-administered medications for this visit.    OBJECTIVE: BP 115/71 mmHg  Pulse 63  Temp(Src) 95.8 F (35.4 C) (Tympanic)  Resp 18  Wt 289 lb 14.5 oz (131.5 kg)   Body mass index is 49.74 kg/(m^2).    ECOG FS:2 - Symptomatic, <50% confined to bed  General: Well-developed, well-nourished, no acute distress. Obese, overall general weakness. Eyes: Pink conjunctiva, anicteric sclera. HEENT: Normocephalic, moist mucous membranes, clear oropharnyx. Lungs: Clear to auscultation bilaterally. Shortness of breath with exertion. Heart: Regular rate and rhythm. No rubs, murmurs, or gallops. Abdomen: Soft, nontender, nondistended. No organomegaly noted, normoactive bowel sounds. Musculoskeletal: No edema, cyanosis, or clubbing. Neuro: Alert, answering all questions appropriately. Cranial nerves grossly intact. Skin: No rashes or petechiae noted. Psych: Normal affect.    LAB RESULTS:  Clinical  Support on 12/03/2014  Component Date Value Ref Range Status  . WBC 12/03/2014 2.8* 3.6 - 11.0 K/uL Final  . RBC 12/03/2014 3.64* 3.80 - 5.20 MIL/uL Final  . Hemoglobin 12/03/2014 11.4* 12.0 - 16.0 g/dL Final  . HCT 12/03/2014 33.5* 35.0 - 47.0 % Final  . MCV 12/03/2014 92.1  80.0 - 100.0 fL Final  . MCH 12/03/2014 31.5  26.0 - 34.0 pg Final  . MCHC 12/03/2014 34.2  32.0 - 36.0 g/dL Final  . RDW 12/03/2014 16.3* 11.5 - 14.5 % Final  . Platelets 12/03/2014 214  150 - 440 K/uL Final  . Neutrophils Relative % 12/03/2014 34   Final  . Neutro Abs 12/03/2014 1.0* 1.4 - 6.5 K/uL Final  . Lymphocytes Relative 12/03/2014 53   Final  . Lymphs Abs 12/03/2014 1.5  1.0 - 3.6 K/uL Final  . Monocytes Relative 12/03/2014 9   Final  . Monocytes Absolute 12/03/2014 0.2  0.2 - 0.9 K/uL Final  . Eosinophils Relative 12/03/2014 2   Final  . Eosinophils Absolute 12/03/2014 0.1  0 -  0.7 K/uL Final  . Basophils Relative 12/03/2014 2   Final  . Basophils Absolute 12/03/2014 0.0  0 - 0.1 K/uL Final  . Sodium 12/03/2014 131* 135 - 145 mmol/L Final  . Potassium 12/03/2014 3.3* 3.5 - 5.1 mmol/L Final  . Chloride 12/03/2014 103  101 - 111 mmol/L Final  . CO2 12/03/2014 23  22 - 32 mmol/L Final  . Glucose, Bld 12/03/2014 115* 65 - 99 mg/dL Final  . BUN 12/03/2014 22* 6 - 20 mg/dL Final  . Creatinine, Ser 12/03/2014 1.12* 0.44 - 1.00 mg/dL Final  . Calcium 12/03/2014 8.0* 8.9 - 10.3 mg/dL Final  . Total Protein 12/03/2014 6.6  6.5 - 8.1 g/dL Final  . Albumin 12/03/2014 3.7  3.5 - 5.0 g/dL Final  . AST 12/03/2014 32  15 - 41 U/L Final  . ALT 12/03/2014 21  14 - 54 U/L Final  . Alkaline Phosphatase 12/03/2014 67  38 - 126 U/L Final  . Total Bilirubin 12/03/2014 1.1  0.3 - 1.2 mg/dL Final  . GFR calc non Af Amer 12/03/2014 48* >60 mL/min Final  . GFR calc Af Amer 12/03/2014 55* >60 mL/min Final   Comment: (NOTE) The eGFR has been calculated using the CKD EPI equation. This calculation has not been validated in all clinical situations. eGFR's persistently <60 mL/min signify possible Chronic Kidney Disease.   . Anion gap 12/03/2014 5  5 - 15 Final  Infusion on 12/02/2014  Component Date Value Ref Range Status  . WBC 12/02/2014 2.3* 3.6 - 11.0 K/uL Final  . RBC 12/02/2014 3.58* 3.80 - 5.20 MIL/uL Final  . Hemoglobin 12/02/2014 11.2* 12.0 - 16.0 g/dL Final  . HCT 12/02/2014 33.0* 35.0 - 47.0 % Final  . MCV 12/02/2014 92.1  80.0 - 100.0 fL Final  . MCH 12/02/2014 31.4  26.0 - 34.0 pg Final  . MCHC 12/02/2014 34.1  32.0 - 36.0 g/dL Final  . RDW 12/02/2014 16.4* 11.5 - 14.5 % Final  . Platelets 12/02/2014 181  150 - 440 K/uL Final  . Neutrophils Relative % 12/02/2014 40   Final  . Neutro Abs 12/02/2014 0.9* 1.4 - 6.5 K/uL Final  . Lymphocytes Relative 12/02/2014 51   Final  . Lymphs Abs 12/02/2014 1.2  1.0 - 3.6 K/uL Final  . Monocytes Relative 12/02/2014 6   Final  .  Monocytes Absolute 12/02/2014 0.1* 0.2 - 0.9 K/uL Final  .  Eosinophils Relative 12/02/2014 2   Final  . Eosinophils Absolute 12/02/2014 0.0  0 - 0.7 K/uL Final  . Basophils Relative 12/02/2014 1   Final  . Basophils Absolute 12/02/2014 0.0  0 - 0.1 K/uL Final    STUDIES: Ct Angio Chest Pe W/cm &/or Wo Cm  12/03/2014   CLINICAL DATA:  Shortness of breath for 3 days. History of breast cancer, currently undergoing chemotherapy. Evaluate for pulmonary embolism.  EXAM: CT ANGIOGRAPHY CHEST WITH CONTRAST  TECHNIQUE: Multidetector CT imaging of the chest was performed using the standard protocol during bolus administration of intravenous contrast. Multiplanar CT image reconstructions and MIPs were obtained to evaluate the vascular anatomy.  CONTRAST:  136m OMNIPAQUE IOHEXOL 350 MG/ML SOLN  COMPARISON:  Chest radiograph - 08/27/2014  FINDINGS: Vascular Findings:  There is suboptimal opacification of the pulmonary arterial system with the main pulmonary artery measuring only 212 Hounsfield units. There are no discrete filling defects within the central pulmonary arterial tree to the level of the bilateral lobar pulmonary arteries. Evaluation of the lobar, segmental and subsegmental pulmonary arteries is degraded secondary to a combination of suboptimal vessel opacification, patient respiratory artifact as well as quantum mottle artifact due to patient body habitus. Normal caliber of the main pulmonary artery.  Borderline cardiomegaly. No pericardial effusion. Scattered minimal coronary artery calcifications. Scattered atherosclerotic plaque with a normal caliber thoracic aorta. Bovine configuration of the aortic arch. The branch vessels of the aortic arch appear widely patent throughout their imaged course.  Left subclavian vein approach port a catheter tip terminates within knee superior cavoatrial junction.  Review of the MIP images confirms the above findings.    ----------------------------------------------------------------------------------  Nonvascular Findings:  Evaluation of the pulmonary parenchyma is markedly degraded secondary to a combination of patient respiratory artifact as well as quantum mottle artifact due to patient body habitus. Bibasilar heterogeneous opacities are favored to represent atelectasis. No discrete focal airspace opacities. No pleural effusion pneumothorax. The central pulmonary airways appear widely patent.  No discrete pulmonary nodules given the limitations of the examination.  Shotty mediastinal lymph nodes are individually not enlarged by size criteria with index right paratracheal lymph node measuring 1 cm in greatest short axis diameter (image 44, series 4) and index left infrahilar lymph node measuring 0.8 cm (image 58). No definitive mediastinal, hilar or axillary lymphadenopathy.  Limited early arterial phase evaluation of the upper abdomen demonstrates a large (at least 10 x 8.1 cm) hypo attenuating (12 Hounsfield unit) presumed cyst arising from the superior pole of the left kidney though the caudal aspect of this presumed cyst was not imaged on this examination. Post cholecystectomy.  No acute or aggressive osseous abnormalities.  IMPRESSION: 1. No acute cardiopulmonary disease. Specifically, no evidence of central pulmonary embolism. There is suboptimal evaluation of the bilateral lobar, segmental and subsegmental pulmonary arteries secondary to multifactorial examination degradation as detailed above. If clinical concern persists for pulmonary embolism, further evaluation could be performed with bilateral lower extremity venous Doppler ultrasound and/or nuclear medicine V/Q scan as clinically indicated. 2. Borderline cardiomegaly. 3. Atherosclerosis including coronary artery calcifications. 4. Large (at least 10 cm) presumed exophytic left-sided renal cyst, incompletely imaged.   Electronically Signed   By: JSandi MariscalM.D.    On: 12/03/2014 18:02    ASSESSMENT:  Carcinoma breast, stage IB. Shortness of breath with exertion. Fatigue.  PLAN:   1. Carcinoma of breast, stage IB. ER/PR positive, HER-2/neu positive. Per discussion with primary oncologist patient will not receive 12th and final  cycle of Taxol. She will continue with Herceptin as previously scheduled. 2. Shortness breath with exertion. CT chest to evaluate for PE has been performed and was found to be negative. Patient subsequently noted to have borderline cardiomegaly, as well as a large left-sided renal cyst.  3. Fatigue. As patient continues with fatigue, we will proceed with 1 L of normal saline and dexamethasone for supportive therapy.  Patient instructed to keep previously scheduled follow-up on October 11 continue with Herceptin only treatment.  Patient expressed understanding and was in agreement with this plan. She also understands that She can call clinic at any time with any questions, concerns, or complaints.   Dr. Oliva Bustard was available for consultation and review of plan of care for this patient.  Invasive ductal carcinoma of breast, stage 1 ([facility])   Staging form: Breast, AJCC 7th Edition     Clinical: Stage IA (T1b, N0, M0) - Signed by Forest Gleason, MD on 07/28/2014   Evlyn Kanner, NP   12/03/2014 2:07 PM

## 2014-12-09 ENCOUNTER — Inpatient Hospital Stay: Payer: Medicare HMO

## 2014-12-09 ENCOUNTER — Inpatient Hospital Stay (HOSPITAL_BASED_OUTPATIENT_CLINIC_OR_DEPARTMENT_OTHER): Payer: Medicare HMO | Admitting: Family Medicine

## 2014-12-09 ENCOUNTER — Encounter: Payer: Self-pay | Admitting: Oncology

## 2014-12-09 VITALS — BP 147/90 | HR 67 | Temp 96.9°F | Wt 293.2 lb

## 2014-12-09 DIAGNOSIS — Z5111 Encounter for antineoplastic chemotherapy: Secondary | ICD-10-CM | POA: Diagnosis not present

## 2014-12-09 DIAGNOSIS — C50411 Malignant neoplasm of upper-outer quadrant of right female breast: Secondary | ICD-10-CM | POA: Diagnosis not present

## 2014-12-09 DIAGNOSIS — Z79899 Other long term (current) drug therapy: Secondary | ICD-10-CM

## 2014-12-09 DIAGNOSIS — C50919 Malignant neoplasm of unspecified site of unspecified female breast: Secondary | ICD-10-CM

## 2014-12-09 DIAGNOSIS — Z170 Estrogen receptor positive status [ER+]: Secondary | ICD-10-CM

## 2014-12-09 DIAGNOSIS — C50911 Malignant neoplasm of unspecified site of right female breast: Secondary | ICD-10-CM

## 2014-12-09 LAB — CBC WITH DIFFERENTIAL/PLATELET
BASOS PCT: 1 %
Basophils Absolute: 0 10*3/uL (ref 0–0.1)
EOS ABS: 0.1 10*3/uL (ref 0–0.7)
Eosinophils Relative: 2 %
HEMATOCRIT: 34.5 % — AB (ref 35.0–47.0)
HEMOGLOBIN: 11.7 g/dL — AB (ref 12.0–16.0)
LYMPHS ABS: 1.8 10*3/uL (ref 1.0–3.6)
Lymphocytes Relative: 34 %
MCH: 30.9 pg (ref 26.0–34.0)
MCHC: 33.8 g/dL (ref 32.0–36.0)
MCV: 91.3 fL (ref 80.0–100.0)
Monocytes Absolute: 1 10*3/uL — ABNORMAL HIGH (ref 0.2–0.9)
Monocytes Relative: 20 %
NEUTROS ABS: 2.3 10*3/uL (ref 1.4–6.5)
NEUTROS PCT: 43 %
Platelets: 204 10*3/uL (ref 150–440)
RBC: 3.77 MIL/uL — AB (ref 3.80–5.20)
RDW: 17.2 % — ABNORMAL HIGH (ref 11.5–14.5)
WBC: 5.2 10*3/uL (ref 3.6–11.0)

## 2014-12-09 LAB — BASIC METABOLIC PANEL
Anion gap: 8 (ref 5–15)
BUN: 24 mg/dL — ABNORMAL HIGH (ref 6–20)
CHLORIDE: 104 mmol/L (ref 101–111)
CO2: 25 mmol/L (ref 22–32)
Calcium: 8.5 mg/dL — ABNORMAL LOW (ref 8.9–10.3)
Creatinine, Ser: 1.11 mg/dL — ABNORMAL HIGH (ref 0.44–1.00)
GFR calc non Af Amer: 48 mL/min — ABNORMAL LOW (ref >60)
GFR, EST AFRICAN AMERICAN: 56 mL/min — AB (ref >60)
Glucose, Bld: 116 mg/dL — ABNORMAL HIGH (ref 65–99)
POTASSIUM: 3.5 mmol/L (ref 3.5–5.1)
SODIUM: 137 mmol/L (ref 135–145)

## 2014-12-09 MED ORDER — ACETAMINOPHEN 325 MG PO TABS
650.0000 mg | ORAL_TABLET | Freq: Once | ORAL | Status: AC
  Administered 2014-12-09: 650 mg via ORAL
  Filled 2014-12-09: qty 2

## 2014-12-09 MED ORDER — HEPARIN SOD (PORK) LOCK FLUSH 100 UNIT/ML IV SOLN: 500.0000 [IU] | Freq: Once | INTRAVENOUS | Status: DC | PRN

## 2014-12-09 MED ORDER — HEPARIN SOD (PORK) LOCK FLUSH 100 UNIT/ML IV SOLN
500.0000 [IU] | Freq: Once | INTRAVENOUS | Status: AC
  Administered 2014-12-09: 500 [IU] via INTRAVENOUS
  Filled 2014-12-09: qty 5

## 2014-12-09 MED ORDER — SODIUM CHLORIDE 0.9 % IJ SOLN
10.0000 mL | INTRAMUSCULAR | Status: DC | PRN
  Administered 2014-12-09: 10 mL
  Filled 2014-12-09: qty 10

## 2014-12-09 MED ORDER — SODIUM CHLORIDE 0.9 % IV SOLN
Freq: Once | INTRAVENOUS | Status: AC
  Administered 2014-12-09: 10:00:00 via INTRAVENOUS
  Filled 2014-12-09: qty 1000

## 2014-12-09 MED ORDER — SODIUM CHLORIDE 0.9 % IJ SOLN
10.0000 mL | Freq: Once | INTRAMUSCULAR | Status: AC
  Administered 2014-12-09: 10 mL via INTRAVENOUS
  Filled 2014-12-09: qty 10

## 2014-12-09 MED ORDER — INFLUENZA VAC SPLIT QUAD 0.5 ML IM SUSY
0.5000 mL | PREFILLED_SYRINGE | Freq: Once | INTRAMUSCULAR | Status: AC
  Administered 2014-12-09: 0.5 mL via INTRAMUSCULAR
  Filled 2014-12-09: qty 0.5

## 2014-12-09 MED ORDER — TRASTUZUMAB CHEMO INJECTION 440 MG
6.0000 mg/kg | Freq: Once | INTRAVENOUS | Status: AC
  Administered 2014-12-09: 798 mg via INTRAVENOUS
  Filled 2014-12-09: qty 38

## 2014-12-09 NOTE — Progress Notes (Signed)
[name]  [phone]  [phone]     Maria Gutierrez DOB: 12-11-1940  MR#: [id number]  GQQ#:[id number]  Patient Care Team: Maria Pink, MD as PCP - General (Family Medicine) Maria Pink, MD as Referring Physician (Family Medicine) Robert Bellow, MD (General Surgery)  CHIEF COMPLAINT:  Chief Complaint  Patient presents with  . OTHER   Patient is here to resume Herceptin therapy for breast cancer.  INTERVAL HISTORY:  Patient is here for further follow-up and evaluation regarding carcinoma of breast. Patient was seen and evaluated last week with acute shortness of breath and weakness. CT scan was reported as negative for PE. Patient reports that overall she is feeling much better. She is able to ambulate throughout clinic without use of wheelchair today. Patient's last cycle of weekly Taxol was held several weeks ago due to an [place] of 900. After discussion with primary oncologist decision to forego any further Taxol treatments was made. She is for Herceptin only today.  REVIEW OF SYSTEMS:   Review of Systems  Constitutional: Positive for malaise/fatigue. Negative for fever, chills, weight loss and diaphoresis.       Mild  HENT: Negative for congestion, ear discharge, ear pain, hearing loss, nosebleeds, sore throat and tinnitus.   Eyes: Negative for blurred vision, double vision, photophobia, pain, discharge and redness.  Respiratory: Negative for cough, hemoptysis, sputum production, shortness of breath, wheezing and stridor.   Cardiovascular: Negative for chest pain, palpitations, orthopnea, claudication, leg swelling and PND.  Gastrointestinal: Negative for heartburn, nausea, vomiting, abdominal pain, diarrhea, constipation, blood in stool and melena.  Genitourinary: Negative.   Musculoskeletal: Negative.   Skin: Negative.   Neurological: Negative for dizziness, tingling, focal weakness, seizures, weakness and headaches.    Endo/Heme/Allergies: Does not bruise/bleed easily.  Psychiatric/Behavioral: Negative for depression. The patient is not nervous/anxious and does not have insomnia.     As per HPI. Otherwise, a complete review of systems is negatve.  ONCOLOGY HISTORY: Oncology History   1.  Carcinoma of right breast.  Status post lumpectomy and sentinel lymph node evaluation.  Tumor size 1 cm.  Estrogen and progesterone receptor positive.  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  Diagnosis in April of 2016 2.  Accelerated partial breast radiation in June of 2016 3.  Weekly Taxol and Herceptin therapy for adjuvant treatment starting from July of 2016     Invasive ductal carcinoma of breast, stage 1 ([facility])   06/17/2014 Initial Diagnosis Invasive ductal carcinoma of breast, stage 1    PAST MEDICAL HISTORY: Past Medical History  Diagnosis Date  . Hyperlipidemia   . Hypertension   . GERD (gastroesophageal reflux disease)   . Arthritis   . Heart murmur   . Anxiety   . Breast cancer of upper-outer quadrant of right female breast ([facility]) 06/10/2014    T1b,Nx; ER 90%, PR 50-90%, HER-2/neu 2+, FISH positive.  . SOB (shortness of breath) 12/03/2014    PAST SURGICAL HISTORY: Past Surgical History  Procedure Laterality Date  . Abdominal hysterectomy    . Kne[place] surgery Left   . Cholecystectomy  2012  . Colonoscopy  2009  . Axillary lymph node biopsy Right 07/14/2014    Procedure: AXILLARY LYMPH NODE BIOPSY;  Surgeon: Robert Bellow, MD;  Location: [facility] ORS;  Service: General;  Laterality: Right;  . Breast biopsy Left 2015  . 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  [name]  Eye surgery Bilateral 2014  . Joint replacement Left 2001  . Joint replacement Right 2003  . Joint replacement Left 2004    replacement joint broken  . Portacath placement Left 08/27/2014    Procedure: INSERTION PORT-A-CATH;  Surgeon: Robert Bellow, MD;  Location: [facility] ORS;  Service: General;  Laterality: Left;    FAMILY HISTORY No family history on file.  GYNECOLOGIC HISTORY:  No LMP recorded. Patient has had a hysterectomy.     ADVANCED DIRECTIVES:    HEALTH MAINTENANCE: Social History  Substance Use Topics  . Smoking status: Never Smoker   . Smokeless tobacco: Never Used  . Alcohol Use: No     Colonoscopy:  PAP:  Bone density:  Lipid panel:  Allergies  Allergen Reactions  . Tape Other (See Comments)    Blisters on skin from tape during recent surgery.    Current Outpatient Prescriptions  Medication Sig Dispense Refill  . aspirin 81 MG tablet Take 81 mg by mouth daily.    . calcium carbonate (OS-CAL) 600 MG TABS tablet Take 600 mg by mouth daily.    . cholecalciferol (VITAMIN D) 1000 UNITS tablet Take 1,000 Units by mouth daily.    . fluticasone (FLONASE) 50 MCG/ACT nasal spray Place 1 spray into both nostrils daily. 16 g 2  . gentamicin ointment (GARAMYCIN) 0.1 %     . hydrochlorothiazide (HYDRODIURIL) 25 MG tablet     . lansoprazole (PREVACID) 15 MG capsule Take 15 mg by mouth every morning.     . lidocaine-prilocaine (EMLA) cream Apply 1 application topically as needed. 30 g 3  . loratadine (CLARITIN) 10 MG tablet TAKE ONE TABLET BY MOUTH ONCE DAILY 30 tablet 0  . meloxicam (MOBIC) 15 MG tablet 15 mg every morning.     . metoprolol succinate (TOPROL-XL) 50 MG 24 hr tablet Take 50 mg by mouth every morning. Take with or immediately following a meal.    . Multiple Vitamins-Minerals (MULTIVITAMIN) tablet Take 1 tablet by mouth daily.    . mupirocin ointment (BACTROBAN) 2 %     . Oxycodone HCl 10 MG TABS Take 1 tablet (10 mg total) by mouth every 6 (six) hours as needed. 30 tablet 0  . PARoxetine (PAXIL) 40 MG tablet Take 40 mg by mouth at bedtime.     . simvastatin (ZOCOR) 40 MG tablet Take 40 mg by mouth at bedtime.     . traMADol (ULTRAM) 50 MG tablet Take by mouth every 6 (six) hours as needed.      No current facility-administered medications for this visit.   Facility-Administered Medications Ordered in Other Visits  Medication Dose Route Frequency Provider Last Rate Last Dose  . heparin lock flush 100 unit/mL  500 Units Intravenous Once Forest Gleason, MD        OBJECTIVE: BP 147/90 mmHg  Pulse 67  Temp(Src) 96.9 F (36.1 C) (Tympanic)  Wt 293 lb 4 oz (133.017 kg)   Body mass index is 50.31 kg/(m^2).    ECOG FS:1 - Symptomatic but completely ambulatory  General: Well-developed, well-nourished, no acute distress. Obese, overall general weakness. Eyes: Gutierrez conjunctiva, anicteric sclera. HEENT: Normocephalic, moist mucous membranes, clear oropharnyx. Lungs: Clear to auscultation bilaterally. Shortness of breath with exertion. Heart: Regular rate and rhythm. No rubs, murmurs, or gallops. Abdomen: Soft, nontender, nondistended. No organomegaly noted, normoactive bowel sounds. Musculoskeletal: No edema, cyanosis, or clubbing. Neuro: Alert, answering all questions appropriately. Cranial nerves grossly intact. Skin: No rashes or petechiae noted. Psych: Normal  affect.    LAB RESULTS:  Infusion on 12/09/2014  Component Date Value Ref Range Status  . WBC 12/09/2014 5.2  3.6 - 11.0 K/uL Final  . RBC 12/09/2014 3.77* 3.80 - 5.20 MIL/uL Final  . Hemoglobin 12/09/2014 11.7* 12.0 - 16.0 g/dL Final  . HCT 12/09/2014 34.5* 35.0 - 47.0 % Final  . MCV 12/09/2014 91.3  80.0 - 100.0 fL Final  . MCH 12/09/2014 30.9  26.0 - 34.0 pg Final  . MCHC 12/09/2014 33.8  32.0 - 36.0 g/dL Final  . RDW 12/09/2014 17.2* 11.5 - 14.5 % Final  . Platelets 12/09/2014 204  150 - 440 K/uL Final  . Neutrophils Relative % 12/09/2014 43   Final  . Neutro Abs 12/09/2014 2.3  1.4 - 6.5 K/uL Final  . Lymphocytes Relative 12/09/2014 34   Final  . Lymphs Abs 12/09/2014 1.8  1.0 - 3.6 K/uL Final  . Monocytes Relative 12/09/2014 20   Final  . Monocytes Absolute 12/09/2014 1.0* 0.2 - 0.9 K/uL Final  .  Eosinophils Relative 12/09/2014 2   Final  . Eosinophils Absolute 12/09/2014 0.1  0 - 0.7 K/uL Final  . Basophils Relative 12/09/2014 1   Final  . Basophils Absolute 12/09/2014 0.0  0 - 0.1 K/uL Final  . Sodium 12/09/2014 137  135 - 145 mmol/L Final  . Potassium 12/09/2014 3.5  3.5 - 5.1 mmol/L Final  . Chloride 12/09/2014 104  101 - 111 mmol/L Final  . CO2 12/09/2014 25  22 - 32 mmol/L Final  . Glucose, Bld 12/09/2014 116* 65 - 99 mg/dL Final  . BUN 12/09/2014 24* 6 - 20 mg/dL Final  . Creatinine, Ser 12/09/2014 1.11* 0.44 - 1.00 mg/dL Final  . Calcium 12/09/2014 8.5* 8.9 - 10.3 mg/dL Final  . GFR calc non Af Amer 12/09/2014 48* >60 mL/min Final  . GFR calc Af Amer 12/09/2014 56* >60 mL/min Final   Comment: (NOTE) The eGFR has been calculated using the CKD EPI equation. This calculation has not been validated in all clinical situations. eGFR's persistently <60 mL/min signify possible Chronic Kidney Disease.   . Anion gap 12/09/2014 8  5 - 15 Final    STUDIES: No results found.  ASSESSMENT:  Carcinoma breast, stage IB.  PLAN:   1. Carcinoma of breast, stage IB. ER/PR positive, HER-2/neu positive. Patient completed approximately 11 of 12 planned Taxol treatments. Taxol was discontinued due to side effect. We will proceed with Herceptin therapy.  Patient to return to clinic in 3 weeks for next Herceptin, lab and evaluation by Dr. Oliva Bustard. All other complaints of fatigue and shortness of breath have resolved.  Patient expressed understanding and was in agreement with this plan. She also understands that She can call clinic at any time with any questions, concerns, or complaints.   Dr. Oliva Bustard was available for consultation and review of plan of care for this patient.  Invasive ductal carcinoma of breast, stage 1 ([facility])   Staging form: Breast, AJCC 7th Edition     Clinical: Stage IA (T1b, N0, M0) - Signed by Forest Gleason, MD on 07/28/2014   Evlyn Kanner, NP   12/03/2014 9:57  AM

## 2014-12-09 NOTE — Progress Notes (Signed)
Patient does not have living will.  Never smoked.  Patient offers no complaints today.

## 2014-12-23 ENCOUNTER — Other Ambulatory Visit: Payer: Self-pay | Admitting: *Deleted

## 2014-12-23 DIAGNOSIS — C50919 Malignant neoplasm of unspecified site of unspecified female breast: Secondary | ICD-10-CM

## 2014-12-24 ENCOUNTER — Ambulatory Visit
Admission: RE | Admit: 2014-12-24 | Discharge: 2014-12-24 | Disposition: A | Discharge status: Home / Self Care | Payer: Medicare HMO | Source: Ambulatory Visit | Attending: Family Medicine | Admitting: Family Medicine

## 2014-12-24 DIAGNOSIS — I5189 Other ill-defined heart diseases: Secondary | ICD-10-CM | POA: Insufficient documentation

## 2014-12-24 DIAGNOSIS — C50919 Malignant neoplasm of unspecified site of unspecified female breast: Secondary | ICD-10-CM | POA: Insufficient documentation

## 2014-12-24 DIAGNOSIS — Z9221 Personal history of antineoplastic chemotherapy: Secondary | ICD-10-CM | POA: Insufficient documentation

## 2014-12-24 DIAGNOSIS — Z79899 Other long term (current) drug therapy: Secondary | ICD-10-CM | POA: Diagnosis present

## 2014-12-24 MED ORDER — TECHNETIUM TC 99M-LABELED RED BLOOD CELLS IV KIT
22.2700 | PACK | Freq: Once | INTRAVENOUS | Status: AC | PRN
  Administered 2014-12-24: 22.27 via INTRAVENOUS

## 2014-12-26 ENCOUNTER — Other Ambulatory Visit: Payer: Self-pay | Admitting: *Deleted

## 2014-12-26 DIAGNOSIS — C50919 Malignant neoplasm of unspecified site of unspecified female breast: Secondary | ICD-10-CM

## 2014-12-30 ENCOUNTER — Inpatient Hospital Stay: Payer: Medicare HMO | Attending: Oncology | Admitting: Oncology

## 2014-12-30 ENCOUNTER — Inpatient Hospital Stay: Payer: Medicare HMO

## 2014-12-30 ENCOUNTER — Encounter: Payer: Self-pay | Admitting: Oncology

## 2014-12-30 VITALS — BP 144/84 | HR 64 | Temp 96.5°F | Wt 289.2 lb

## 2014-12-30 DIAGNOSIS — Z7982 Long term (current) use of aspirin: Secondary | ICD-10-CM | POA: Insufficient documentation

## 2014-12-30 DIAGNOSIS — E785 Hyperlipidemia, unspecified: Secondary | ICD-10-CM | POA: Diagnosis not present

## 2014-12-30 DIAGNOSIS — Z923 Personal history of irradiation: Secondary | ICD-10-CM | POA: Diagnosis not present

## 2014-12-30 DIAGNOSIS — C50919 Malignant neoplasm of unspecified site of unspecified female breast: Secondary | ICD-10-CM

## 2014-12-30 DIAGNOSIS — I10 Essential (primary) hypertension: Secondary | ICD-10-CM | POA: Diagnosis not present

## 2014-12-30 DIAGNOSIS — R040 Epistaxis: Secondary | ICD-10-CM | POA: Insufficient documentation

## 2014-12-30 DIAGNOSIS — K219 Gastro-esophageal reflux disease without esophagitis: Secondary | ICD-10-CM | POA: Insufficient documentation

## 2014-12-30 DIAGNOSIS — Z5112 Encounter for antineoplastic immunotherapy: Secondary | ICD-10-CM | POA: Insufficient documentation

## 2014-12-30 DIAGNOSIS — Z170 Estrogen receptor positive status [ER+]: Secondary | ICD-10-CM | POA: Insufficient documentation

## 2014-12-30 DIAGNOSIS — Z79899 Other long term (current) drug therapy: Secondary | ICD-10-CM

## 2014-12-30 DIAGNOSIS — R0981 Nasal congestion: Secondary | ICD-10-CM | POA: Diagnosis not present

## 2014-12-30 DIAGNOSIS — F419 Anxiety disorder, unspecified: Secondary | ICD-10-CM

## 2014-12-30 DIAGNOSIS — C50411 Malignant neoplasm of upper-outer quadrant of right female breast: Secondary | ICD-10-CM | POA: Diagnosis not present

## 2014-12-30 DIAGNOSIS — C50911 Malignant neoplasm of unspecified site of right female breast: Secondary | ICD-10-CM

## 2014-12-30 LAB — COMPREHENSIVE METABOLIC PANEL
ALK PHOS: 69 U/L (ref 38–126)
ALT: 22 U/L (ref 14–54)
ANION GAP: 6 (ref 5–15)
AST: 33 U/L (ref 15–41)
Albumin: 3.6 g/dL (ref 3.5–5.0)
BUN: 19 mg/dL (ref 6–20)
CALCIUM: 8.4 mg/dL — AB (ref 8.9–10.3)
CHLORIDE: 104 mmol/L (ref 101–111)
CO2: 25 mmol/L (ref 22–32)
CREATININE: 1.06 mg/dL — AB (ref 0.44–1.00)
GFR, EST AFRICAN AMERICAN: 59 mL/min — AB (ref >60)
GFR, EST NON AFRICAN AMERICAN: 51 mL/min — AB (ref >60)
Glucose, Bld: 124 mg/dL — ABNORMAL HIGH (ref 65–99)
Potassium: 3.4 mmol/L — ABNORMAL LOW (ref 3.5–5.1)
SODIUM: 135 mmol/L (ref 135–145)
Total Bilirubin: 1.1 mg/dL (ref 0.3–1.2)
Total Protein: 6.7 g/dL (ref 6.5–8.1)

## 2014-12-30 LAB — CBC WITH DIFFERENTIAL/PLATELET
BASOS PCT: 1 %
Basophils Absolute: 0.1 10*3/uL (ref 0–0.1)
EOS ABS: 0.4 10*3/uL (ref 0–0.7)
EOS PCT: 5 %
HCT: 36.7 % (ref 35.0–47.0)
Hemoglobin: 12.3 g/dL (ref 12.0–16.0)
Lymphocytes Relative: 25 %
Lymphs Abs: 1.7 10*3/uL (ref 1.0–3.6)
MCH: 31.3 pg (ref 26.0–34.0)
MCHC: 33.5 g/dL (ref 32.0–36.0)
MCV: 93.6 fL (ref 80.0–100.0)
Monocytes Absolute: 0.7 10*3/uL (ref 0.2–0.9)
Monocytes Relative: 10 %
NEUTROS PCT: 59 %
Neutro Abs: 3.9 10*3/uL (ref 1.4–6.5)
PLATELETS: 186 10*3/uL (ref 150–440)
RBC: 3.92 MIL/uL (ref 3.80–5.20)
RDW: 17.9 % — ABNORMAL HIGH (ref 11.5–14.5)
WBC: 6.7 10*3/uL (ref 3.6–11.0)

## 2014-12-30 MED ORDER — HEPARIN SOD (PORK) LOCK FLUSH 100 UNIT/ML IV SOLN
500.0000 [IU] | Freq: Once | INTRAVENOUS | Status: AC
  Administered 2014-12-30: 500 [IU] via INTRAVENOUS
  Filled 2014-12-30: qty 5

## 2014-12-30 MED ORDER — SODIUM CHLORIDE 0.9 % IV SOLN
Freq: Once | INTRAVENOUS | Status: AC
  Administered 2014-12-30: 15:00:00 via INTRAVENOUS
  Filled 2014-12-30: qty 1000

## 2014-12-30 MED ORDER — SODIUM CHLORIDE 0.9 % IJ SOLN
10.0000 mL | INTRAMUSCULAR | Status: DC | PRN
  Administered 2014-12-30: 10 mL via INTRAVENOUS
  Filled 2014-12-30: qty 10

## 2014-12-30 MED ORDER — SODIUM CHLORIDE 0.9 % IJ SOLN
10.0000 mL | INTRAMUSCULAR | Status: DC | PRN
  Filled 2014-12-30: qty 10

## 2014-12-30 MED ORDER — TRASTUZUMAB CHEMO INJECTION 440 MG
6.0000 mg/kg | Freq: Once | INTRAVENOUS | Status: AC
  Administered 2014-12-30: 798 mg via INTRAVENOUS
  Filled 2014-12-30: qty 38

## 2014-12-30 MED ORDER — ACETAMINOPHEN 325 MG PO TABS
650.0000 mg | ORAL_TABLET | Freq: Once | ORAL | Status: AC
  Administered 2014-12-30: 650 mg via ORAL
  Filled 2014-12-30: qty 2

## 2014-12-30 MED ORDER — HEPARIN SOD (PORK) LOCK FLUSH 100 UNIT/ML IV SOLN: 500.0000 [IU] | Freq: Once | INTRAVENOUS | Status: DC | PRN

## 2014-12-30 MED ORDER — LETROZOLE 2.5 MG PO TABS: 2.5000 mg | ORAL_TABLET | Freq: Every day | ORAL | Status: DC

## 2014-12-30 NOTE — Progress Notes (Signed)
[place] @ [facility] [place] [phone]) [phone]  Fax:(336) [place]: Nov 19, 1940  MR#: [id number]  OVF#:[id number]  Patient Care Team: [name], MD as PCP - General (Family Medicine) [name], MD as Referring Physician (Family Medicine) Robert Bellow, MD (General Surgery)  CHIEF COMPLAINT:  Chief Complaint  Patient presents with  . OTHER   Oncology History   1.  Carcinoma of right breast.  Status post lumpectomy and sentinel lymph node evaluation.  Tumor size 1 cm.  Estrogen and progesterone receptor positive.  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  Diagnosis in April of 2016 2.  Accelerated partial breast radiation in June of 2016 3.  Weekly Taxol and Herceptin therapy for adjuvant treatment starting from July of 2016      VISIT DIAGNOSIS:   No diagnosis found.    Oncology Flowsheet 10/21/2014 10/28/2014 11/11/2014 11/18/2014 11/25/2014 12/04/2014 12/09/2014  Day, Cycle Day 1, Cycle 6 Day 1, Cycle 7 Day 1, Cycle 9 Day 1, Cycle 10 Day 1, Cycle 11 - -  dexamethasone (DECADRON) IV [ 10 mg ] [ 6 mg ] [ 6 mg ] [ 6 mg ] [ 6 mg ] 10 mg -  ondansetron (ZOFRAN) IJ - - - - - - -  ondansetron (ZOFRAN) IV [ 8 mg ] [ 8 mg ] [ 8 mg ] [ 8 mg ] [ 8 mg ] - -  PACLitaxel (TAXOL) IV 80 mg/m2 80 mg/m2 80 mg/m2 80 mg/m2 80 mg/m2 - -  trastuzumab (HERCEPTIN) IV - 6 mg/kg - 6 mg/kg - - 6 mg/kg    INTERVAL HISTORY:  74 year old lady with a history of carcinoma breast on Taxol and Herceptin tolerating treatment very well.  No chills.  No fever.  No nausea.  No vomiting.   .   Patient is here for ongoing evaluation and for continuation of Herceptin therapy.  Repeat MUGA scan of the heart has been reviewed independently and shows 58% ejection fraction which is slightly less than before.  But patient remains asymptomatic.   REVIEW OF SYSTEMS:     general status: Alert and oriented and not any acute distress.  No change in a performance status.  No chills.  No  fever. HEENT: Continues to have congested nose and congested sinuses.  No fever.  Occasional epistaxis. Lungs: No cough or shortness of breath Cardiac: No chest pain or paroxysmal nocturnal dyspnea GI: No nausea no vomiting no diarrhea no abdominal pain Skin: No rash Lower extremity no swelling Neurological system: No tingling.  No numbness.  No other focal signs Musculoskeletal system no bony pains   PAST MEDICAL HISTORY: Past Medical History  Diagnosis Date  . Hyperlipidemia   . Hypertension   . GERD (gastroesophageal reflux disease)   . Arthritis   . Heart murmur   . Anxiety   . Breast cancer of upper-outer quadrant of right female breast ([place]) 06/10/2014    T1b,Nx; ER 90%, PR 50-90%, HER-2/neu 2+, FISH positive.  . SOB (shortness of breath) 12/03/2014    PAST SURGICAL HISTORY: Past Surgical History  Procedure Laterality Date  . Abdominal hysterectomy    . Kneee surgery Left   . Cholecystectomy  2012  . Colonoscopy  2009  . Axillary lymph node biopsy Right 07/14/2014    Procedure: AXILLARY LYMPH NODE BIOPSY;  Surgeon: Robert Bellow, MD;  Location: [facility] ORS;  Service: General;  Laterality: Right;  . Breast biopsy Left 2015  . Breast surgery Right 06/10/14  Wide excision for intermediate grade DCIS, identification of a 10  millimeter area of invasive mammary carcinoma.  . Eye surgery Bilateral 2014  . Joint replacement Left 2001  . Joint replacement Right 2003  . Joint replacement Left 2004    replacement joint broken  . Portacath placement Left 08/27/2014    Procedure: INSERTION PORT-A-CATH;  Surgeon: Robert Bellow, MD;  Location: [facility] ORS;  Service: General;  Laterality: Left;    FAMILY HISTORY There is no significant family history of breast cancer, ovarian cancer, colon cancer       ADVANCED DIRECTIVES: Patient does not have any advanced healthcare directive. Information has been given.   HEALTH MAINTENANCE: Social History  Substance Use Topics  .  Smoking status: Never Smoker   . Smokeless tobacco: Never Used  . Alcohol Use: No      Allergies  Allergen Reactions  . Tape Other (See Comments)    Blisters on skin from tape during recent surgery.    Current Outpatient Prescriptions  Medication Sig Dispense Refill  . aspirin 81 MG tablet Take 81 mg by mouth daily.    . calcium carbonate (OS-CAL) 600 MG TABS tablet Take 600 mg by mouth daily.    . cholecalciferol (VITAMIN D) 1000 UNITS tablet Take 1,000 Units by mouth daily.    . fluticasone (FLONASE) 50 MCG/ACT nasal spray Place 1 spray into both nostrils daily. 16 g 2  . gentamicin ointment (GARAMYCIN) 0.1 %     . hydrochlorothiazide (HYDRODIURIL) 25 MG tablet     . lansoprazole (PREVACID) 15 MG capsule Take 15 mg by mouth every morning.     . lidocaine-prilocaine (EMLA) cream Apply 1 application topically as needed. 30 g 3  . loratadine (CLARITIN) 10 MG tablet TAKE ONE TABLET BY MOUTH ONCE DAILY 30 tablet 0  . meloxicam (MOBIC) 15 MG tablet 15 mg every morning.     . metoprolol succinate (TOPROL-XL) 50 MG 24 hr tablet Take 50 mg by mouth every morning. Take with or immediately following a meal.    . Multiple Vitamins-Minerals (MULTIVITAMIN) tablet Take 1 tablet by mouth daily.    . mupirocin ointment (BACTROBAN) 2 %     . Oxycodone HCl 10 MG TABS Take 1 tablet (10 mg total) by mouth every 6 (six) hours as needed. 30 tablet 0  . PARoxetine (PAXIL) 40 MG tablet Take 40 mg by mouth at bedtime.     . simvastatin (ZOCOR) 40 MG tablet Take 40 mg by mouth at bedtime.     . traMADol (ULTRAM) 50 MG tablet Take by mouth every 6 (six) hours as needed.     No current facility-administered medications for this visit.   Facility-Administered Medications Ordered in Other Visits  Medication Dose Route Frequency Provider Last Rate Last Dose  . heparin lock flush 100 unit/mL  500 Units Intravenous Once Forest Gleason, MD      . sodium chloride 0.9 % injection 10 mL  10 mL Intravenous PRN Forest Gleason, MD   10 mL at 12/30/14 1349    OBJECTIVE: PHYSICAL EXAM: General  status: Performance status is good.  Patient has not lost significant weight HEENT: No evidence of stomatitis. Sclera and conjunctivae :: No jaundice.   pale looking. Evaluation of the nose shows no evidence of ulceration but redness.  Bleeding. Lungs: Air  entry equal on both sides.  No rhonchi.  No rales.  Cardiac: Heart sounds are normal.  No pericardial rub.  No murmur. Lymphatic system:  Cervical, axillary, inguinal, lymph nodes not palpable GI: Abdomen is soft.  No ascites.  Liver spleen not palpable.  No tenderness.  Bowel sounds are within normal limit Lower extremity: No edema Neurological system: Higher functions, cranial nerves intact no evidence of peripheral neuropathy. Skin: No rash.  No ecchymosis.[name] [place] within normal limit  Filed Vitals:   12/30/14 1417  BP: 144/84  Pulse: 64  Temp: 96.5 F (35.8 C)     Body mass index is 49.62 kg/(m^2).    ECOG FS:0 - Asymptomatic  LAB RESULTS:  Infusion on 12/30/2014  Component Date Value Ref Range Status  . WBC 12/30/2014 6.7  3.6 - 11.0 K/uL Final  . RBC 12/30/2014 3.92  3.80 - 5.20 MIL/uL Final  . Hemoglobin 12/30/2014 12.3  12.0 - 16.0 g/dL Final  . HCT 12/30/2014 36.7  35.0 - 47.0 % Final  . MCV 12/30/2014 93.6  80.0 - 100.0 fL Final  . MCH 12/30/2014 31.3  26.0 - 34.0 pg Final  . MCHC 12/30/2014 33.5  32.0 - 36.0 g/dL Final  . RDW 12/30/2014 17.9* 11.5 - 14.5 % Final  . Platelets 12/30/2014 186  150 - 440 K/uL Final  . Neutrophils Relative % 12/30/2014 59   Final  . Neutro Abs 12/30/2014 3.9  1.4 - 6.5 K/uL Final  . Lymphocytes Relative 12/30/2014 25   Final  . Lymphs Abs 12/30/2014 1.7  1.0 - 3.6 K/uL Final  . Monocytes Relative 12/30/2014 10   Final  . Monocytes Absolute 12/30/2014 0.7  0.2 - 0.9 K/uL Final  . Eosinophils Relative 12/30/2014 5   Final  . Eosinophils Absolute 12/30/2014 0.4  0 - 0.7 K/uL Final  . Basophils Relative  12/30/2014 1   Final  . Basophils Absolute 12/30/2014 0.1  0 - 0.1 K/uL Final  . Sodium 12/30/2014 135  135 - 145 mmol/L Final  . Potassium 12/30/2014 3.4* 3.5 - 5.1 mmol/L Final  . Chloride 12/30/2014 104  101 - 111 mmol/L Final  . CO2 12/30/2014 25  22 - 32 mmol/L Final  . Glucose, Bld 12/30/2014 124* 65 - 99 mg/dL Final  . BUN 12/30/2014 19  6 - 20 mg/dL Final  . Creatinine, Ser 12/30/2014 1.06* 0.44 - 1.00 mg/dL Final  . Calcium 12/30/2014 8.4* 8.9 - 10.3 mg/dL Final  . Total Protein 12/30/2014 6.7  6.5 - 8.1 g/dL Final  . Albumin 12/30/2014 3.6  3.5 - 5.0 g/dL Final  . AST 12/30/2014 33  15 - 41 U/L Final  . ALT 12/30/2014 22  14 - 54 U/L Final  . Alkaline Phosphatase 12/30/2014 69  38 - 126 U/L Final  . Total Bilirubin 12/30/2014 1.1  0.3 - 1.2 mg/dL Final  . GFR calc non Af Amer 12/30/2014 51* >60 mL/min Final  . GFR calc Af Amer 12/30/2014 59* >60 mL/min Final   Comment: (NOTE) The eGFR has been calculated using the CKD EPI equation. This calculation has not been validated in all clinical situations. eGFR's persistently <60 mL/min signify possible Chronic Kidney Disease.   . Anion gap 12/30/2014 6  5 - 15 Final   IMPRESSION: Normal LEFT ventricular ejection fraction of 58%.  Normal LEFT ventricular wall motion.  When compared to the previous exam, current calculi LEFT ventricular ejection fraction has mildly decreased from the 65% on the prior study.   ASSESSMENT:  74 year old lady with a history of carcinoma of breast stage IB Sentinel lymph nodes negative Estrogen receptor progesterone receptor positive HER-2/neu receptor and  Continue Herceptin therapy  Also discussed starting anti-hormonal therapy Various side effects had been discussed with the patient. For calcium and vitamin D had been discussed For bone density has been discussed Letrozole would be startedTotal duration of visit was25 minutes.  50% or more time was spent in counseling patient and family  regarding prognosis and options of treatment and available resources  PLAN:  All lab data has been reviewed.  MUGA scan of the heart has been reviewed .  Staging form: Breast, AJCC 7th Edition     Clinical: Stage IA (T1b, N0, M0) - Signed by Forest Gleason, MD on 07/28/2014   Forest Gleason, MD   12/30/2014 2:26 PM

## 2015-01-15 ENCOUNTER — Telehealth: Payer: Self-pay | Admitting: *Deleted

## 2015-01-15 NOTE — Telephone Encounter (Signed)
Per MD pt needs to use saline mist (OTC). If problem continues then will refer to ENT. Stop aspirin for 7 days. Nose bleed is not related to treatment.

## 2015-01-15 NOTE — Telephone Encounter (Signed)
Patient advised of MD instructions and she stated she is using saline already, she has seen Dr Con Memos who told her it is the treatments causing it, and that her nose bleeds for an hour then stops and restarts in 45 mins. She states she has lost a "lot of blood and I have a headache from all the blood loss" When asked if she has checked her /P she sates it is 120/64 this morning. After discussing this with Dr Oliva Bustard he advises that she go to the ER and I called her with this advise and she states that she does not have a nose bleed right now and asked what they would do for he. I told her they could either cauterize or pack her nose and check her labs. She said she will go if her nose starts to bleed again

## 2015-01-27 ENCOUNTER — Inpatient Hospital Stay: Payer: Medicare HMO

## 2015-01-27 ENCOUNTER — Inpatient Hospital Stay (HOSPITAL_BASED_OUTPATIENT_CLINIC_OR_DEPARTMENT_OTHER): Payer: Medicare HMO | Admitting: Oncology

## 2015-01-27 VITALS — BP 140/84 | HR 65 | Temp 96.3°F | Wt 287.0 lb

## 2015-01-27 DIAGNOSIS — C50919 Malignant neoplasm of unspecified site of unspecified female breast: Secondary | ICD-10-CM

## 2015-01-27 DIAGNOSIS — C50911 Malignant neoplasm of unspecified site of right female breast: Secondary | ICD-10-CM

## 2015-01-27 DIAGNOSIS — Z923 Personal history of irradiation: Secondary | ICD-10-CM

## 2015-01-27 DIAGNOSIS — Z170 Estrogen receptor positive status [ER+]: Secondary | ICD-10-CM | POA: Diagnosis not present

## 2015-01-27 DIAGNOSIS — C50411 Malignant neoplasm of upper-outer quadrant of right female breast: Secondary | ICD-10-CM | POA: Diagnosis not present

## 2015-01-27 DIAGNOSIS — Z79899 Other long term (current) drug therapy: Secondary | ICD-10-CM

## 2015-01-27 DIAGNOSIS — Z5112 Encounter for antineoplastic immunotherapy: Secondary | ICD-10-CM | POA: Diagnosis not present

## 2015-01-27 DIAGNOSIS — R040 Epistaxis: Secondary | ICD-10-CM

## 2015-01-27 LAB — CBC WITH DIFFERENTIAL/PLATELET
Basophils Absolute: 0.1 10*3/uL (ref 0–0.1)
Basophils Relative: 1 %
EOS ABS: 0.1 10*3/uL (ref 0–0.7)
Eosinophils Relative: 2 %
HCT: 37.3 % (ref 35.0–47.0)
HEMOGLOBIN: 12.3 g/dL (ref 12.0–16.0)
LYMPHS ABS: 2.1 10*3/uL (ref 1.0–3.6)
Lymphocytes Relative: 34 %
MCH: 30.6 pg (ref 26.0–34.0)
MCHC: 33 g/dL (ref 32.0–36.0)
MCV: 92.7 fL (ref 80.0–100.0)
Monocytes Absolute: 0.7 10*3/uL (ref 0.2–0.9)
Monocytes Relative: 11 %
NEUTROS PCT: 52 %
Neutro Abs: 3.3 10*3/uL (ref 1.4–6.5)
Platelets: 193 10*3/uL (ref 150–440)
RBC: 4.02 MIL/uL (ref 3.80–5.20)
RDW: 15.5 % — ABNORMAL HIGH (ref 11.5–14.5)
WBC: 6.3 10*3/uL (ref 3.6–11.0)

## 2015-01-27 LAB — COMPREHENSIVE METABOLIC PANEL
ALK PHOS: 62 U/L (ref 38–126)
ALT: 19 U/L (ref 14–54)
ANION GAP: 9 (ref 5–15)
AST: 27 U/L (ref 15–41)
Albumin: 3.8 g/dL (ref 3.5–5.0)
BUN: 19 mg/dL (ref 6–20)
CALCIUM: 9 mg/dL (ref 8.9–10.3)
CO2: 26 mmol/L (ref 22–32)
CREATININE: 1.07 mg/dL — AB (ref 0.44–1.00)
Chloride: 103 mmol/L (ref 101–111)
GFR, EST AFRICAN AMERICAN: 58 mL/min — AB (ref >60)
GFR, EST NON AFRICAN AMERICAN: 50 mL/min — AB (ref >60)
Glucose, Bld: 124 mg/dL — ABNORMAL HIGH (ref 65–99)
Potassium: 3.4 mmol/L — ABNORMAL LOW (ref 3.5–5.1)
Sodium: 138 mmol/L (ref 135–145)
TOTAL PROTEIN: 6.6 g/dL (ref 6.5–8.1)
Total Bilirubin: 0.8 mg/dL (ref 0.3–1.2)

## 2015-01-27 MED ORDER — HEPARIN SOD (PORK) LOCK FLUSH 100 UNIT/ML IV SOLN
500.0000 [IU] | Freq: Once | INTRAVENOUS | Status: AC
  Administered 2015-01-27: 500 [IU] via INTRAVENOUS
  Filled 2015-01-27: qty 5

## 2015-01-27 MED ORDER — TRASTUZUMAB CHEMO INJECTION 440 MG
6.0000 mg/kg | Freq: Once | INTRAVENOUS | Status: AC
  Administered 2015-01-27: 798 mg via INTRAVENOUS
  Filled 2015-01-27: qty 38

## 2015-01-27 MED ORDER — MELOXICAM 15 MG PO TABS: 15.0000 mg | ORAL_TABLET | Freq: Every day | ORAL | Status: DC | PRN

## 2015-01-27 MED ORDER — ACETAMINOPHEN 325 MG PO TABS
650.0000 mg | ORAL_TABLET | Freq: Once | ORAL | Status: AC
  Administered 2015-01-27: 650 mg via ORAL
  Filled 2015-01-27: qty 2

## 2015-01-27 MED ORDER — SODIUM CHLORIDE 0.9 % IV SOLN
Freq: Once | INTRAVENOUS | Status: AC
  Administered 2015-01-27: 15:00:00 via INTRAVENOUS
  Filled 2015-01-27: qty 1000

## 2015-01-27 MED ORDER — SODIUM CHLORIDE 0.9 % IJ SOLN
10.0000 mL | INTRAMUSCULAR | Status: AC | PRN
  Administered 2015-01-27: 10 mL via INTRAVENOUS
  Filled 2015-01-27: qty 10

## 2015-01-27 NOTE — Progress Notes (Signed)
[place] @ [facility] [phone]  Fax:(336) [place]: 1940/09/17  MR#: [id number]  POE#:[id number]  Patient Care Team: [name], MD as PCP - General (Family Medicine) [name], MD as Referring Physician (Family Medicine) Robert Bellow, MD (General Surgery)  CHIEF COMPLAINT:  Chief Complaint  Patient presents with  . Breast Cancer   Oncology History   1.  Carcinoma of right breast.  Status post lumpectomy and sentinel lymph node evaluation.  Tumor size 1 cm.  Estrogen and progesterone receptor positive.  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  Diagnosis in April of 2016 2.  Accelerated partial breast radiation in June of 2016 3.  Weekly Taxol and Herceptin therapy for adjuvant treatment starting from July of 2016      VISIT DIAGNOSIS:   No diagnosis found.    Oncology Flowsheet 10/28/2014 11/11/2014 11/18/2014 11/25/2014 12/04/2014 12/09/2014 12/30/2014  Day, Cycle Day 1, Cycle 7 Day 1, Cycle 9 Day 1, Cycle 10 Day 1, Cycle 11 - - -  dexamethasone (DECADRON) IV [ 6 mg ] [ 6 mg ] [ 6 mg ] [ 6 mg ] 10 mg - -  ondansetron (ZOFRAN) IJ - - - - - - -  ondansetron (ZOFRAN) IV [ 8 mg ] [ 8 mg ] [ 8 mg ] [ 8 mg ] - - -  PACLitaxel (TAXOL) IV 80 mg/m2 80 mg/m2 80 mg/m2 80 mg/m2 - - -  trastuzumab (HERCEPTIN) IV 6 mg/kg - 6 mg/kg - - 6 mg/kg 6 mg/kg    INTERVAL HISTORY:  74 year old lady with a history of carcinoma breast on Taxol and Herceptin tolerating treatment very well.  No chills.  No fever.  No nausea.  No vomiting.   Patient has developed epistaxis was evaluated by ENT surgeon.  Was advised nasal mist. No more epistaxis. .   Patient is here for ongoing evaluation and for continuation of Herceptin therapy.  Repeat MUGA scan of the heart has been reviewed independently and shows 58% ejection fraction which is slightly less than before.  But patient remains asymptomatic.   REVIEW OF SYSTEMS:     general status: Alert and oriented  and not any acute distress.  No change in a performance status.  No chills.  No fever. HEENT: Continues to have congested nose and congested sinuses.  No fever.  Occasional epistaxis. Lungs: No cough or shortness of breath.  Epistaxis as describedin   history of present illness Cardiac: No chest pain or paroxysmal nocturnal dyspnea GI: No nausea no vomiting no diarrhea no abdominal pain Skin: No rash Lower extremity no swelling Neurological system: No tingling.  No numbness.  No other focal signs Musculoskeletal system no bony pains   PAST MEDICAL HISTORY: Past Medical History  Diagnosis Date  . Hyperlipidemia   . Hypertension   . GERD (gastroesophageal reflux disease)   . Arthritis   . Heart murmur   . Anxiety   . Breast cancer of upper-outer quadrant of right female breast ([place]) 06/10/2014    T1b,Nx; ER 90%, PR 50-90%, HER-2/neu 2+, FISH positive.  . SOB (shortness of breath) 12/03/2014    PAST SURGICAL HISTORY: Past Surgical History  Procedure Laterality Date  . Abdominal hysterectomy    . [place]ne[place] surgery Left   . Cholecystectomy  2012  . Colonoscopy  2009  . Axillary lymph node biopsy Right 07/14/2014    Procedure: AXILLARY LYMPH NODE BIOPSY;  Surgeon: Robert Bellow, MD;  Location: [facility] ORS;  Service: General;  Laterality: Right;  . Breast biopsy Left 2015  . 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  . Eye surgery Bilateral 2014  . Joint replacement Left 2001  . Joint replacement Right 2003  . Joint replacement Left 2004    replacement joint broken  . Portacath placement Left 08/27/2014    Procedure: INSERTION PORT-A-CATH;  Surgeon: Robert Bellow, MD;  Location: [facility] ORS;  Service: General;  Laterality: Left;    FAMILY HISTORY There is no significant family history of breast cancer, ovarian cancer, colon cancer       ADVANCED DIRECTIVES: Patient does not  have any advanced healthcare directive. Information has been given.   HEALTH MAINTENANCE: Social History  Substance Use Topics  . Smoking status: Never Smoker   . Smokeless tobacco: Never Used  . Alcohol Use: No      Allergies  Allergen Reactions  . Tape Other (See Comments)    Blisters on skin from tape during recent surgery.    Current Outpatient Prescriptions  Medication Sig Dispense Refill  . aspirin 81 MG tablet Take 81 mg by mouth daily.    . calcium carbonate (OS-CAL) 600 MG TABS tablet Take 600 mg by mouth daily.    . cholecalciferol (VITAMIN D) 1000 UNITS tablet Take 1,000 Units by mouth daily.    . fluticasone (FLONASE) 50 MCG/ACT nasal spray Place 1 spray into both nostrils daily. 16 g 2  . gentamicin ointment (GARAMYCIN) 0.1 %     . hydrochlorothiazide (HYDRODIURIL) 25 MG tablet     . lansoprazole (PREVACID) 15 MG capsule Take 15 mg by mouth every morning.     [name] letrozole (FEMARA) 2.5 MG tablet Take 1 tablet (2.5 mg total) by mouth daily. 30 tablet 6  . lidocaine-prilocaine (EMLA) cream Apply 1 application topically as needed. 30 g 3  . loratadine (CLARITIN) 10 MG tablet TAKE ONE TABLET BY MOUTH ONCE DAILY 30 tablet 0  . meloxicam (MOBIC) 15 MG tablet 15 mg every morning.     . metoprolol succinate (TOPROL-XL) 50 MG 24 hr tablet Take 50 mg by mouth every morning. Take with or immediately following a meal.    . Multiple Vitamins-Minerals (MULTIVITAMIN) tablet Take 1 tablet by mouth daily.    . mupirocin ointment (BACTROBAN) 2 %     . Oxycodone HCl 10 MG TABS Take 1 tablet (10 mg total) by mouth every 6 (six) hours as needed. 30 tablet 0  . PARoxetine (PAXIL) 40 MG tablet Take 40 mg by mouth at bedtime.     . simvastatin (ZOCOR) 40 MG tablet Take 40 mg by mouth at bedtime.     . traMADol (ULTRAM) 50 MG tablet Take by mouth every 6 (six) hours as needed.     No current facility-administered medications for this visit.   Facility-Administered Medications Ordered in  Other Visits  Medication Dose Route Frequency Provider Last Rate Last Dose  . heparin lock flush 100 unit/mL  500 Units Intravenous Once Forest Gleason, MD      . sodium chloride 0.9 % injection 10 mL  10 mL Intravenous PRN Forest Gleason, MD   10 mL at 01/27/15 1416    OBJECTIVE: PHYSICAL EXAM: General  status: Performance status is good.  Patient has not lost significant weight HEENT: No evidence of stomatitis. Sclera and conjunctivae :: No jaundice.   pale looking. Evaluation of the  nose shows no evidence of ulceration but redness.  Bleeding. Lungs: Air  entry equal on both sides.  No rhonchi.  No rales.  Cardiac: Heart sounds are normal.  No pericardial rub.  No murmur. Lymphatic system: Cervical, axillary, inguinal, lymph nodes not palpable GI: Abdomen is soft.  No ascites.  Liver spleen not palpable.  No tenderness.  Bowel sounds are within normal limit Lower extremity: No edema Neurological system: Higher functions, cranial nerves intact no evidence of peripheral neuropathy. Skin: No rash.  No ecchymosis.[name] [place] within normal limit  Filed Vitals:   01/27/15 1413  BP: 140/84  Pulse: 65  Temp: 96.3 F (35.7 C)     Body mass index is 49.25 kg/(m^2).    ECOG FS:0 - Asymptomatic  LAB RESULTS:  Clinical Support on 01/27/2015  Component Date Value Ref Range Status  . WBC 01/27/2015 6.3  3.6 - 11.0 K/uL Final  . RBC 01/27/2015 4.02  3.80 - 5.20 MIL/uL Final  . Hemoglobin 01/27/2015 12.3  12.0 - 16.0 g/dL Final  . HCT 01/27/2015 37.3  35.0 - 47.0 % Final  . MCV 01/27/2015 92.7  80.0 - 100.0 fL Final  . MCH 01/27/2015 30.6  26.0 - 34.0 pg Final  . MCHC 01/27/2015 33.0  32.0 - 36.0 g/dL Final  . RDW 01/27/2015 15.5* 11.5 - 14.5 % Final  . Platelets 01/27/2015 193  150 - 440 K/uL Final  . Neutrophils Relative % 01/27/2015 52   Final  . Neutro Abs 01/27/2015 3.3  1.4 - 6.5 K/uL Final  . Lymphocytes Relative 01/27/2015 34   Final  . Lymphs Abs 01/27/2015 2.1  1.0 - 3.6 K/uL Final    . Monocytes Relative 01/27/2015 11   Final  . Monocytes Absolute 01/27/2015 0.7  0.2 - 0.9 K/uL Final  . Eosinophils Relative 01/27/2015 2   Final  . Eosinophils Absolute 01/27/2015 0.1  0 - 0.7 K/uL Final  . Basophils Relative 01/27/2015 1   Final  . Basophils Absolute 01/27/2015 0.1  0 - 0.1 K/uL Final  . Sodium 01/27/2015 138  135 - 145 mmol/L Final  . Potassium 01/27/2015 3.4* 3.5 - 5.1 mmol/L Final  . Chloride 01/27/2015 103  101 - 111 mmol/L Final  . CO2 01/27/2015 26  22 - 32 mmol/L Final  . Glucose, Bld 01/27/2015 124* 65 - 99 mg/dL Final  . BUN 01/27/2015 19  6 - 20 mg/dL Final  . Creatinine, Ser 01/27/2015 1.07* 0.44 - 1.00 mg/dL Final  . Calcium 01/27/2015 9.0  8.9 - 10.3 mg/dL Final  . Total Protein 01/27/2015 6.6  6.5 - 8.1 g/dL Final  . Albumin 01/27/2015 3.8  3.5 - 5.0 g/dL Final  . AST 01/27/2015 27  15 - 41 U/L Final  . ALT 01/27/2015 19  14 - 54 U/L Final  . Alkaline Phosphatase 01/27/2015 62  38 - 126 U/L Final  . Total Bilirubin 01/27/2015 0.8  0.3 - 1.2 mg/dL Final  . GFR calc non Af Amer 01/27/2015 50* >60 mL/min Final  . GFR calc Af Amer 01/27/2015 58* >60 mL/min Final   Comment: (NOTE) The eGFR has been calculated using the CKD EPI equation. This calculation has not been validated in all clinical situations. eGFR's persistently <60 mL/min signify possible Chronic Kidney Disease.   . Anion gap 01/27/2015 9  5 - 15 Final   IMPRESSION: Normal LEFT ventricular ejection fraction of 58%.  Normal LEFT ventricular wall motion.  When compared to the previous exam, current  calculi LEFT ventricular ejection fraction has mildly decreased from the 65% on the prior study.   Electronically Signed  By: Lavonia Dana M.D.  On: 12/24/2014 11:45   ASSESSMENT:  74 year old lady with a history of carcinoma of breast stage IB Sentinel lymph nodes negative Estrogen receptor progesterone receptor positive HER-2/neu receptor and  Continue Herceptin  therapy  Epistaxis :etiology is not clear. Herceptin is not known to cause epistaxis.  As opposed to chemotherapy on a Avastin We will continue to watch ENT evaluation appreciated records have been reviewed MUGA scan of the heart repeated on October 2 016 has been evaluated independently.  There is slight reduction in ejection fraction will be monitored.   PLAN:  All lab data has been reviewed.  MUGA scan of the heart has been reviewed .  Staging form: Breast, AJCC 7th Edition     Clinical: Stage IA (T1b, N0, M0) - Signed by Forest Gleason, MD on 07/28/2014   Forest Gleason, MD   01/27/2015 2:40 PM

## 2015-01-27 NOTE — Progress Notes (Signed)
Patient states she is having significant nose bleeds.  Last week she had one that she states was like "turning a faucet on".

## 2015-01-28 ENCOUNTER — Telehealth: Payer: Self-pay | Admitting: *Deleted

## 2015-01-28 MED ORDER — HYDROCODONE-ACETAMINOPHEN 5-325 MG PO TABS: 1.0000 | ORAL_TABLET | Freq: Four times a day (QID) | ORAL | Status: DC | PRN

## 2015-01-28 NOTE — Telephone Encounter (Signed)
Patient requesting refill for Hydrocodone-Acetaminophen.  Advised patient she can pick up prescription after 2 PM today.

## 2015-01-31 ENCOUNTER — Encounter: Payer: Self-pay | Admitting: Oncology

## 2015-02-17 ENCOUNTER — Inpatient Hospital Stay: Payer: Medicare HMO

## 2015-02-17 ENCOUNTER — Encounter: Payer: Self-pay | Admitting: Oncology

## 2015-02-17 ENCOUNTER — Inpatient Hospital Stay: Payer: Medicare HMO | Attending: Oncology | Admitting: Oncology

## 2015-02-17 VITALS — BP 167/83 | HR 69 | Temp 97.5°F | Resp 18 | Wt 285.7 lb

## 2015-02-17 DIAGNOSIS — I10 Essential (primary) hypertension: Secondary | ICD-10-CM | POA: Diagnosis not present

## 2015-02-17 DIAGNOSIS — Z170 Estrogen receptor positive status [ER+]: Secondary | ICD-10-CM | POA: Diagnosis not present

## 2015-02-17 DIAGNOSIS — C50911 Malignant neoplasm of unspecified site of right female breast: Secondary | ICD-10-CM

## 2015-02-17 DIAGNOSIS — J209 Acute bronchitis, unspecified: Secondary | ICD-10-CM | POA: Insufficient documentation

## 2015-02-17 DIAGNOSIS — K219 Gastro-esophageal reflux disease without esophagitis: Secondary | ICD-10-CM | POA: Insufficient documentation

## 2015-02-17 DIAGNOSIS — Z5111 Encounter for antineoplastic chemotherapy: Secondary | ICD-10-CM | POA: Diagnosis not present

## 2015-02-17 DIAGNOSIS — E785 Hyperlipidemia, unspecified: Secondary | ICD-10-CM | POA: Diagnosis not present

## 2015-02-17 DIAGNOSIS — Z7982 Long term (current) use of aspirin: Secondary | ICD-10-CM | POA: Insufficient documentation

## 2015-02-17 DIAGNOSIS — F419 Anxiety disorder, unspecified: Secondary | ICD-10-CM | POA: Insufficient documentation

## 2015-02-17 DIAGNOSIS — C50919 Malignant neoplasm of unspecified site of unspecified female breast: Secondary | ICD-10-CM

## 2015-02-17 DIAGNOSIS — Z79899 Other long term (current) drug therapy: Secondary | ICD-10-CM | POA: Diagnosis not present

## 2015-02-17 LAB — COMPREHENSIVE METABOLIC PANEL
ALBUMIN: 4 g/dL (ref 3.5–5.0)
ALK PHOS: 65 U/L (ref 38–126)
ALT: 21 U/L (ref 14–54)
ANION GAP: 9 (ref 5–15)
AST: 30 U/L (ref 15–41)
BUN: 22 mg/dL — AB (ref 6–20)
CALCIUM: 8.8 mg/dL — AB (ref 8.9–10.3)
CO2: 24 mmol/L (ref 22–32)
Chloride: 102 mmol/L (ref 101–111)
Creatinine, Ser: 1.15 mg/dL — ABNORMAL HIGH (ref 0.44–1.00)
GFR calc non Af Amer: 46 mL/min — ABNORMAL LOW (ref >60)
GFR, EST AFRICAN AMERICAN: 53 mL/min — AB (ref >60)
GLUCOSE: 119 mg/dL — AB (ref 65–99)
Potassium: 3.8 mmol/L (ref 3.5–5.1)
SODIUM: 135 mmol/L (ref 135–145)
TOTAL PROTEIN: 6.8 g/dL (ref 6.5–8.1)
Total Bilirubin: 0.6 mg/dL (ref 0.3–1.2)

## 2015-02-17 LAB — CBC WITH DIFFERENTIAL/PLATELET
BASOS PCT: 1 %
Basophils Absolute: 0.1 10*3/uL (ref 0–0.1)
EOS ABS: 0.2 10*3/uL (ref 0–0.7)
EOS PCT: 3 %
HCT: 38.8 % (ref 35.0–47.0)
HEMOGLOBIN: 13.1 g/dL (ref 12.0–16.0)
LYMPHS ABS: 2.1 10*3/uL (ref 1.0–3.6)
Lymphocytes Relative: 34 %
MCH: 31 pg (ref 26.0–34.0)
MCHC: 33.6 g/dL (ref 32.0–36.0)
MCV: 92.1 fL (ref 80.0–100.0)
MONO ABS: 0.7 10*3/uL (ref 0.2–0.9)
MONOS PCT: 12 %
Neutro Abs: 3.2 10*3/uL (ref 1.4–6.5)
Neutrophils Relative %: 50 %
Platelets: 178 10*3/uL (ref 150–440)
RBC: 4.22 MIL/uL (ref 3.80–5.20)
RDW: 14.2 % (ref 11.5–14.5)
WBC: 6.2 10*3/uL (ref 3.6–11.0)

## 2015-02-17 MED ORDER — SODIUM CHLORIDE 0.9 % IV SOLN
Freq: Once | INTRAVENOUS | Status: AC
  Administered 2015-02-17: 15:00:00 via INTRAVENOUS
  Filled 2015-02-17: qty 1000

## 2015-02-17 MED ORDER — AZITHROMYCIN 500 MG PO TABS: 500.0000 mg | ORAL_TABLET | Freq: Every day | ORAL | Status: DC

## 2015-02-17 MED ORDER — ACETAMINOPHEN 325 MG PO TABS
650.0000 mg | ORAL_TABLET | Freq: Once | ORAL | Status: AC
  Administered 2015-02-17: 650 mg via ORAL
  Filled 2015-02-17: qty 2

## 2015-02-17 MED ORDER — TRASTUZUMAB CHEMO INJECTION 440 MG
6.0000 mg/kg | Freq: Once | INTRAVENOUS | Status: AC
  Administered 2015-02-17: 798 mg via INTRAVENOUS
  Filled 2015-02-17: qty 38

## 2015-02-17 MED ORDER — HEPARIN SOD (PORK) LOCK FLUSH 100 UNIT/ML IV SOLN
500.0000 [IU] | Freq: Once | INTRAVENOUS | Status: AC | PRN
  Administered 2015-02-17: 500 [IU]
  Filled 2015-02-17: qty 5

## 2015-02-17 NOTE — Progress Notes (Signed)
Patient has developed cough at night.  Has been taking Robitussin.

## 2015-02-17 NOTE — Progress Notes (Signed)
[place] @ [facility] [phone]) [phone]  Fax:(336) [place]: 02-12-1941  MR#: [id number]  CWC#:[id number]  Patient Care Team: [name], MD as PCP - General (Family Medicine) [name], MD as Referring Physician (Family Medicine) Robert Bellow, MD (General Surgery)  CHIEF COMPLAINT:  Chief Complaint  Patient presents with  . Breast Cancer   Oncology History   1.  Carcinoma of right breast.  Status post lumpectomy and sentinel lymph node evaluation.  Tumor size 1 cm.  Estrogen and progesterone receptor positive.  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  Diagnosis in April of 2016 2.  Accelerated partial breast radiation in June of 2016 3.  Weekly Taxol and Herceptin therapy for adjuvant treatment starting from July of 2016 4.Taxol was discontinue in September of 2016 because of side effect of neuropathy Patient was started on maintenance Herceptin therapy from October of 2016     VISIT DIAGNOSIS:   No diagnosis found.    Oncology Flowsheet 11/11/2014 11/18/2014 11/25/2014 12/04/2014 12/09/2014 12/30/2014 01/27/2015  Day, Cycle Day 1, Cycle 9 Day 1, Cycle 10 Day 1, Cycle 11 - - - -  dexamethasone (DECADRON) IV [ 6 mg ] [ 6 mg ] [ 6 mg ] 10 mg - - -  ondansetron (ZOFRAN) IJ - - - - - - -  ondansetron (ZOFRAN) IV [ 8 mg ] [ 8 mg ] [ 8 mg ] - - - -  PACLitaxel (TAXOL) IV 80 mg/m2 80 mg/m2 80 mg/m2 - - - -  trastuzumab (HERCEPTIN) IV - 6 mg/kg - - 6 mg/kg 6 mg/kg 6 mg/kg    INTERVAL HISTORY:  74 year old lady with a history of carcinoma breast on Taxol and Herceptin tolerating treatment very well.  No chills.  No fever.  No nausea.  No vomiting.   Patient has developed epistaxis was evaluated by ENT surgeon.  Was advised nasal mist. No more epistaxis. .   Patient is here for ongoing evaluation and for continuation of Herceptin therapy.  Repeat MUGA scan of the heart has been reviewed independently and shows 58% ejection fraction which is  slightly less than before.  But patient remains asymptomatic. Patient is here for ongoing evaluation.  Has dry hacking cough with occasional expectoration for last 7-10 days.  No chills.  No fever.   REVIEW OF SYSTEMS:     general status: Alert and oriented and not any acute distress.  No change in a performance status.  No chills.  No fever. HEENT: Continues to have congested nose and congested sinuses.  No fever.  Occasional epistaxis. Lungs: No cough or shortness of breath.  Epistaxis as describedin   history of present illness Cardiac: No chest pain or paroxysmal nocturnal dyspnea GI: No nausea no vomiting no diarrhea no abdominal pain Skin: No rash Lower extremity no swelling Neurological system: No tingling.  No numbness.  No other focal signs Musculoskeletal system no bony pains   PAST MEDICAL HISTORY: Past Medical History  Diagnosis Date  . Hyperlipidemia   . Hypertension   . GERD (gastroesophageal reflux disease)   . Arthritis   . Heart murmur   . Anxiety   . Breast cancer of upper-outer quadrant of right female breast ([place]) 06/10/2014    T1b,Nx; ER 90%, PR 50-90%, HER-2/neu 2+, FISH positive.  . SOB (shortness of breath) 12/03/2014    PAST SURGICAL HISTORY: Past Surgical History  Procedure Laterality Date  . Abdominal hysterectomy    . [place]ne[place] surgery Left   .  Cholecystectomy  2012  . Colonoscopy  2009  . Axillary lymph node biopsy Right 07/14/2014    Procedure: AXILLARY LYMPH NODE BIOPSY;  Surgeon: Robert Bellow, MD;  Location: [facility] ORS;  Service: General;  Laterality: Right;  . Breast biopsy Left 2015  . 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  . Eye surgery Bilateral 2014  . Joint replacement Left 2001  . Joint replacement Right 2003  . Joint replacement Left 2004    replacement joint broken  . Portacath placement Left 08/27/2014    Procedure:  INSERTION PORT-A-CATH;  Surgeon: Robert Bellow, MD;  Location: [facility] ORS;  Service: General;  Laterality: Left;    FAMILY HISTORY There is no significant family history of breast cancer, ovarian cancer, colon cancer       ADVANCED DIRECTIVES: Patient does not have any advanced healthcare directive. Information has been given.   HEALTH MAINTENANCE: Social History  Substance Use Topics  . Smoking status: Never Smoker   . Smokeless tobacco: Never Used  . Alcohol Use: No      Allergies  Allergen Reactions  . Tape Other (See Comments)    Blisters on skin from tape during recent surgery.    Current Outpatient Prescriptions  Medication Sig Dispense Refill  . aspirin 81 MG tablet Take 81 mg by mouth daily.    . calcium carbonate (OS-CAL) 600 MG TABS tablet Take 600 mg by mouth daily.    . cholecalciferol (VITAMIN D) 1000 UNITS tablet Take 1,000 Units by mouth daily.    . fluticasone (FLONASE) 50 MCG/ACT nasal spray Place 1 spray into both nostrils daily. 16 g 2  . gentamicin ointment (GARAMYCIN) 0.1 %     . hydrochlorothiazide (HYDRODIURIL) 25 MG tablet     . HYDROcodone-acetaminophen (NORCO) 5-325 MG tablet Take 1 tablet by mouth every 6 (six) hours as needed for moderate pain. 30 tablet 0  . lansoprazole (PREVACID) 15 MG capsule Take 15 mg by mouth every morning.     [name] letrozole (FEMARA) 2.5 MG tablet Take 1 tablet (2.5 mg total) by mouth daily. 30 tablet 6  . lidocaine-prilocaine (EMLA) cream Apply 1 application topically as needed. 30 g 3  . loratadine (CLARITIN) 10 MG tablet TAKE ONE TABLET BY MOUTH ONCE DAILY 30 tablet 0  . meloxicam (MOBIC) 15 MG tablet Take 1 tablet (15 mg total) by mouth daily as needed for pain. 30 tablet 3  . metoprolol succinate (TOPROL-XL) 50 MG 24 hr tablet Take 50 mg by mouth every morning. Take with or immediately following a meal.    . Multiple Vitamins-Minerals (MULTIVITAMIN) tablet Take 1 tablet by mouth daily.    . mupirocin ointment  (BACTROBAN) 2 %     . Oxycodone HCl 10 MG TABS Take 1 tablet (10 mg total) by mouth every 6 (six) hours as needed. 30 tablet 0  . PARoxetine (PAXIL) 40 MG tablet Take 40 mg by mouth at bedtime.     . simvastatin (ZOCOR) 40 MG tablet Take 40 mg by mouth at bedtime.     . traMADol (ULTRAM) 50 MG tablet Take by mouth every 6 (six) hours as needed.     No current facility-administered medications for this visit.   Facility-Administered Medications Ordered in Other Visits  Medication Dose Route Frequency Provider Last Rate Last Dose  . sodium chloride 0.9 % injection 10 mL  10 mL Intravenous  PRN Forest Gleason, MD   10 mL at 01/27/15 1416    OBJECTIVE: PHYSICAL EXAM: General  status: Performance status is good.  Patient has not lost significant weight HEENT: No evidence of stomatitis. Sclera and conjunctivae :: No jaundice.   pale looking. Evaluation of the nose shows no evidence of ulceration but redness.  Bleeding. Lungs: Air  entry equal on both sides.  No rhonchi.  No rales.  Cardiac: Heart sounds are normal.  No pericardial rub.  No murmur. Lymphatic system: Cervical, axillary, inguinal, lymph nodes not palpable GI: Abdomen is soft.  No ascites.  Liver spleen not palpable.  No tenderness.  Bowel sounds are within normal limit Lower extremity: No edema Neurological system: Higher functions, cranial nerves intact no evidence of peripheral neuropathy. Skin: No rash.  No ecchymosis.[name] Port [place] within normal limit  Filed Vitals:   02/17/15 1408  BP: 167/83  Pulse: 69  Temp: 97.5 F (36.4 C)  Resp: 18     Body mass index is 49.02 kg/(m^2).    ECOG FS:0 - Asymptomatic  LAB RESULTS:  Infusion on 02/17/2015  Component Date Value Ref Range Status  . WBC 02/17/2015 6.2  3.6 - 11.0 K/uL Final  . RBC 02/17/2015 4.22  3.80 - 5.20 MIL/uL Final  . Hemoglobin 02/17/2015 13.1  12.0 - 16.0 g/dL Final  . HCT 02/17/2015 38.8  35.0 - 47.0 % Final  . MCV 02/17/2015 92.1  80.0 - 100.0 fL Final  .  MCH 02/17/2015 31.0  26.0 - 34.0 pg Final  . MCHC 02/17/2015 33.6  32.0 - 36.0 g/dL Final  . RDW 02/17/2015 14.2  11.5 - 14.5 % Final  . Platelets 02/17/2015 178  150 - 440 K/uL Final  . Neutrophils Relative % 02/17/2015 50   Final  . Neutro Abs 02/17/2015 3.2  1.4 - 6.5 K/uL Final  . Lymphocytes Relative 02/17/2015 34   Final  . Lymphs Abs 02/17/2015 2.1  1.0 - 3.6 K/uL Final  . Monocytes Relative 02/17/2015 12   Final  . Monocytes Absolute 02/17/2015 0.7  0.2 - 0.9 K/uL Final  . Eosinophils Relative 02/17/2015 3   Final  . Eosinophils Absolute 02/17/2015 0.2  0 - 0.7 K/uL Final  . Basophils Relative 02/17/2015 1   Final  . Basophils Absolute 02/17/2015 0.1  0 - 0.1 K/uL Final  . Sodium 02/17/2015 135  135 - 145 mmol/L Final  . Potassium 02/17/2015 3.8  3.5 - 5.1 mmol/L Final  . Chloride 02/17/2015 102  101 - 111 mmol/L Final  . CO2 02/17/2015 24  22 - 32 mmol/L Final  . Glucose, Bld 02/17/2015 119* 65 - 99 mg/dL Final  . BUN 02/17/2015 22* 6 - 20 mg/dL Final  . Creatinine, Ser 02/17/2015 1.15* 0.44 - 1.00 mg/dL Final  . Calcium 02/17/2015 8.8* 8.9 - 10.3 mg/dL Final  . Total Protein 02/17/2015 6.8  6.5 - 8.1 g/dL Final  . Albumin 02/17/2015 4.0  3.5 - 5.0 g/dL Final  . AST 02/17/2015 30  15 - 41 U/L Final  . ALT 02/17/2015 21  14 - 54 U/L Final  . Alkaline Phosphatase 02/17/2015 65  38 - 126 U/L Final  . Total Bilirubin 02/17/2015 0.6  0.3 - 1.2 mg/dL Final  . GFR calc non Af Amer 02/17/2015 46* >60 mL/min Final  . GFR calc Af Amer 02/17/2015 53* >60 mL/min Final   Comment: (NOTE) The eGFR has been calculated using the CKD EPI equation. This calculation has not been  validated in all clinical situations. eGFR's persistently <60 mL/min signify possible Chronic Kidney Disease.   . Anion gap 02/17/2015 9  5 - 15 Final   IMPRESSION: Normal LEFT ventricular ejection fraction of 58%.  Normal LEFT ventricular wall motion.  When compared to the previous exam, current calculi  LEFT ventricular ejection fraction has mildly decreased from the 65% on the prior study.   Electronically Signed  By: Lavonia Dana M.D.  On: 12/24/2014 11:45   ASSESSMENT:  74 year old lady with a history of carcinoma of breast stage IB Sentinel lymph nodes negative Estrogen receptor progesterone receptor positive HER-2/neu receptor and  Continue Herceptin therapy 2.  Dry hacking cough. Acute bronchitis Patient was given now asked for 5 days If cough continues and chest x-ray would be done Next MUGA scan would be done in February of 2016  PLAN:  All lab data has been reviewed.  MUGA scan of the heart has been reviewed .  Staging form: Breast, AJCC 7th Edition     Clinical: Stage IA (T1b, N0, M0) - Signed by Forest Gleason, MD on 07/28/2014   Forest Gleason, MD   02/17/2015 2:15 PM

## 2015-02-24 ENCOUNTER — Telehealth: Payer: Self-pay | Admitting: *Deleted

## 2015-02-24 NOTE — Telephone Encounter (Signed)
Pt states is having chest congestion after finishing antibiotic. Pt would like another antibiotic to be prescribed. Per Dr. Grayland Ormond, pt needs to try OTC sinus and cold medication. Pt verbalized understanding.

## 2015-03-10 ENCOUNTER — Inpatient Hospital Stay: Payer: Medicare HMO

## 2015-03-10 ENCOUNTER — Inpatient Hospital Stay: Payer: Medicare HMO | Attending: Family Medicine | Admitting: Family Medicine

## 2015-03-10 ENCOUNTER — Encounter: Payer: Self-pay | Admitting: Internal Medicine

## 2015-03-10 VITALS — BP 152/73 | HR 67 | Temp 97.7°F | Resp 18 | Wt 287.9 lb

## 2015-03-10 DIAGNOSIS — C50911 Malignant neoplasm of unspecified site of right female breast: Secondary | ICD-10-CM

## 2015-03-10 DIAGNOSIS — I10 Essential (primary) hypertension: Secondary | ICD-10-CM | POA: Insufficient documentation

## 2015-03-10 DIAGNOSIS — K219 Gastro-esophageal reflux disease without esophagitis: Secondary | ICD-10-CM | POA: Diagnosis not present

## 2015-03-10 DIAGNOSIS — C50919 Malignant neoplasm of unspecified site of unspecified female breast: Secondary | ICD-10-CM

## 2015-03-10 DIAGNOSIS — Z923 Personal history of irradiation: Secondary | ICD-10-CM | POA: Insufficient documentation

## 2015-03-10 DIAGNOSIS — Z5112 Encounter for antineoplastic immunotherapy: Secondary | ICD-10-CM | POA: Insufficient documentation

## 2015-03-10 DIAGNOSIS — Z170 Estrogen receptor positive status [ER+]: Secondary | ICD-10-CM

## 2015-03-10 DIAGNOSIS — E785 Hyperlipidemia, unspecified: Secondary | ICD-10-CM | POA: Diagnosis not present

## 2015-03-10 DIAGNOSIS — C50411 Malignant neoplasm of upper-outer quadrant of right female breast: Secondary | ICD-10-CM | POA: Insufficient documentation

## 2015-03-10 DIAGNOSIS — Z79899 Other long term (current) drug therapy: Secondary | ICD-10-CM | POA: Insufficient documentation

## 2015-03-10 LAB — COMPREHENSIVE METABOLIC PANEL
ALK PHOS: 69 U/L (ref 38–126)
ALT: 18 U/L (ref 14–54)
AST: 24 U/L (ref 15–41)
Albumin: 3.8 g/dL (ref 3.5–5.0)
Anion gap: 6 (ref 5–15)
BUN: 22 mg/dL — AB (ref 6–20)
CALCIUM: 9.3 mg/dL (ref 8.9–10.3)
CO2: 27 mmol/L (ref 22–32)
CREATININE: 1.13 mg/dL — AB (ref 0.44–1.00)
Chloride: 103 mmol/L (ref 101–111)
GFR, EST AFRICAN AMERICAN: 54 mL/min — AB (ref >60)
GFR, EST NON AFRICAN AMERICAN: 47 mL/min — AB (ref >60)
Glucose, Bld: 111 mg/dL — ABNORMAL HIGH (ref 65–99)
Potassium: 3.7 mmol/L (ref 3.5–5.1)
SODIUM: 136 mmol/L (ref 135–145)
Total Bilirubin: 0.7 mg/dL (ref 0.3–1.2)
Total Protein: 6.9 g/dL (ref 6.5–8.1)

## 2015-03-10 LAB — CBC WITH DIFFERENTIAL/PLATELET
BASOS ABS: 0.1 10*3/uL (ref 0–0.1)
Basophils Relative: 1 %
Eosinophils Absolute: 0.2 10*3/uL (ref 0–0.7)
Eosinophils Relative: 3 %
HCT: 40.3 % (ref 35.0–47.0)
HEMOGLOBIN: 13.4 g/dL (ref 12.0–16.0)
LYMPHS ABS: 2 10*3/uL (ref 1.0–3.6)
LYMPHS PCT: 29 %
MCH: 30.4 pg (ref 26.0–34.0)
MCHC: 33.3 g/dL (ref 32.0–36.0)
MCV: 91.4 fL (ref 80.0–100.0)
Monocytes Absolute: 0.7 10*3/uL (ref 0.2–0.9)
Monocytes Relative: 10 %
NEUTROS ABS: 4 10*3/uL (ref 1.4–6.5)
NEUTROS PCT: 57 %
Platelets: 178 10*3/uL (ref 150–440)
RBC: 4.41 MIL/uL (ref 3.80–5.20)
RDW: 13.6 % (ref 11.5–14.5)
WBC: 6.9 10*3/uL (ref 3.6–11.0)

## 2015-03-10 MED ORDER — SODIUM CHLORIDE 0.9 % IV SOLN
Freq: Once | INTRAVENOUS | Status: AC
  Administered 2015-03-10: 15:00:00 via INTRAVENOUS
  Filled 2015-03-10: qty 1000

## 2015-03-10 MED ORDER — HEPARIN SOD (PORK) LOCK FLUSH 100 UNIT/ML IV SOLN
500.0000 [IU] | Freq: Once | INTRAVENOUS | Status: AC | PRN
  Administered 2015-03-10: 500 [IU]
  Filled 2015-03-10: qty 5

## 2015-03-10 MED ORDER — SODIUM CHLORIDE 0.9 % IJ SOLN
10.0000 mL | INTRAMUSCULAR | Status: DC | PRN
  Administered 2015-03-10: 10 mL
  Filled 2015-03-10: qty 10

## 2015-03-10 MED ORDER — ACETAMINOPHEN 325 MG PO TABS
650.0000 mg | ORAL_TABLET | Freq: Once | ORAL | Status: AC
  Administered 2015-03-10: 650 mg via ORAL
  Filled 2015-03-10: qty 2

## 2015-03-10 MED ORDER — SODIUM CHLORIDE 0.9 % IV SOLN
6.0000 mg/kg | Freq: Once | INTRAVENOUS | Status: AC
  Administered 2015-03-10: 798 mg via INTRAVENOUS
  Filled 2015-03-10: qty 38

## 2015-03-10 NOTE — Progress Notes (Signed)
[place] @ [facility] Telephone:(336) [phone]  Fax:([phone]) [place]: 07/09/1940  MR#: [id number]  EXH#:[id number]  Patient Care Team: [name], MD as PCP - General (Family Medicine) [name], MD as Referring Physician (Family Medicine) Robert Bellow, MD (General Surgery)  CHIEF COMPLAINT:  Chief Complaint  Patient presents with  . Breast Cancer   Oncology History   1.  Carcinoma of right breast.  Status post lumpectomy and sentinel lymph node evaluation.  Tumor size 1 cm.  Estrogen and progesterone receptor positive.  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  Diagnosis in April of 2016 2.  Accelerated partial breast radiation in June of 2016 3.  Weekly Taxol and Herceptin therapy for adjuvant treatment starting from July of 2016 4.Taxol was discontinue in September of 2016 because of side effect of neuropathy Patient was started on maintenance Herceptin therapy from October of 2016     VISIT DIAGNOSIS:     ICD-9-CM ICD-10-CM   1. Invasive ductal carcinoma of breast, stage 1, unspecified laterality (HCC) 174.9 C50.919 CANCELED: CBC with Differential/Platelet      Oncology Flowsheet 11/18/2014 11/25/2014 12/04/2014 12/09/2014 12/30/2014 01/27/2015 02/17/2015  Day, Cycle Day 1, Cycle 10 Day 1, Cycle 11 - - - - -  dexamethasone (DECADRON) IV [ 6 mg ] [ 6 mg ] 10 mg - - - -  ondansetron (ZOFRAN) IJ - - - - - - -  ondansetron (ZOFRAN) IV [ 8 mg ] [ 8 mg ] - - - - -  PACLitaxel (TAXOL) IV 80 mg/m2 80 mg/m2 - - - - -  trastuzumab (HERCEPTIN) IV 6 mg/kg - - 6 mg/kg 6 mg/kg 6 mg/kg 6 mg/kg    INTERVAL HISTORY:  Patient is here for ongoing evaluation and for continuation of Herceptin therapy. MUGA scan of the heart from 12/24/2014 has been reviewed and shows 58% ejection fraction which is slightly less than before, but patient remains asymptomatic. Patient is here for ongoing evaluation. Patient reports overall feeling fairly well. She denies any  congestion, cough, shortness of breath.  REVIEW OF SYSTEMS:    General status: Alert and oriented and not any acute distress.  No change in performance status.  No chills.  No fever. HEENT: Continues to have congested nose and congested sinuses.  No fever.  Occasional epistaxis. Lungs: No cough or shortness of breath.   Cardiac: No chest pain or paroxysmal nocturnal dyspnea GI: No nausea no vomiting no diarrhea no abdominal pain Skin: No rash Lower extremity: no swelling Neurological system: No tingling.  No numbness.  No other focal signs Musculoskeletal system: no bony pains   PAST MEDICAL HISTORY: Past Medical History  Diagnosis Date  . Hyperlipidemia   . Hypertension   . GERD (gastroesophageal reflux disease)   . Arthritis   . Heart murmur   . Anxiety   . Breast cancer of upper-outer quadrant of right female breast ([place]) 06/10/2014    T1b,Nx; ER 90%, PR 50-90%, HER-2/neu 2+, FISH positive.  . SOB (shortness of breath) 12/03/2014    PAST SURGICAL HISTORY: Past Surgical History  Procedure Laterality Date  . Abdominal hysterectomy    . [place] surgery Left   . Cholecystectomy  2012  . Colonoscopy  2009  . Axillary lymph node biopsy Right 07/14/2014    Procedure: AXILLARY LYMPH NODE BIOPSY;  Surgeon: Robert Bellow, MD;  Location: [facility] ORS;  Service: General;  Laterality: Right;  . Breast biopsy Left 2015  . Breast surgery Right  06/10/14    Wide excision for intermediate grade DCIS, identification of a 10  millimeter area of invasive mammary carcinoma.  . Eye surgery Bilateral 2014  . Joint replacement Left 2001  . Joint replacement Right 2003  . Joint replacement Left 2004    replacement joint broken  . Portacath placement Left 08/27/2014    Procedure: INSERTION PORT-A-CATH;  Surgeon: Robert Bellow, MD;  Location: [facility] ORS;  Service: General;  Laterality: Left;    FAMILY HISTORY There is no significant family history of breast cancer, ovarian cancer, colon  cancer       ADVANCED DIRECTIVES: Patient does not have any advanced healthcare directive. Information has been given.   HEALTH MAINTENANCE: Social History  Substance Use Topics  . Smoking status: Never Smoker   . Smokeless tobacco: Never Used  . Alcohol Use: No      Allergies  Allergen Reactions  . Tape Other (See Comments)    Blisters on skin from tape during recent surgery.    Current Outpatient Prescriptions  Medication Sig Dispense Refill  . aspirin 81 MG tablet Take 81 mg by mouth daily.    . calcium carbonate (OS-CAL) 600 MG TABS tablet Take 600 mg by mouth daily.    . cholecalciferol (VITAMIN D) 1000 UNITS tablet Take 1,000 Units by mouth daily.    . fluticasone (FLONASE) 50 MCG/ACT nasal spray Place 1 spray into both nostrils daily. 16 g 2  . gentamicin ointment (GARAMYCIN) 0.1 %     . hydrochlorothiazide (HYDRODIURIL) 25 MG tablet     . HYDROcodone-acetaminophen (NORCO) 5-325 MG tablet Take 1 tablet by mouth every 6 (six) hours as needed for moderate pain. 30 tablet 0  . lansoprazole (PREVACID) 15 MG capsule Take 15 mg by mouth every morning.     [name] letrozole (FEMARA) 2.5 MG tablet Take 1 tablet (2.5 mg total) by mouth daily. 30 tablet 6  . lidocaine-prilocaine (EMLA) cream Apply 1 application topically as needed. 30 g 3  . loratadine (CLARITIN) 10 MG tablet TAKE ONE TABLET BY MOUTH ONCE DAILY 30 tablet 0  . meloxicam (MOBIC) 15 MG tablet Take 1 tablet (15 mg total) by mouth daily as needed for pain. 30 tablet 3  . metoprolol succinate (TOPROL-XL) 50 MG 24 hr tablet Take 50 mg by mouth every morning. Take with or immediately following a meal.    . Multiple Vitamins-Minerals (MULTIVITAMIN) tablet Take 1 tablet by mouth daily.    . mupirocin ointment (BACTROBAN) 2 %     . Oxycodone HCl 10 MG TABS Take 1 tablet (10 mg total) by mouth every 6 (six) hours as needed. 30 tablet 0  . PARoxetine (PAXIL) 40 MG tablet Take 40 mg by mouth at bedtime.     . simvastatin (ZOCOR)  40 MG tablet Take 40 mg by mouth at bedtime.     . traMADol (ULTRAM) 50 MG tablet Take by mouth every 6 (six) hours as needed.     No current facility-administered medications for this visit.   Facility-Administered Medications Ordered in Other Visits  Medication Dose Route Frequency Provider Last Rate Last Dose  . sodium chloride 0.9 % injection 10 mL  10 mL Intravenous PRN Forest Gleason, MD   10 mL at 01/27/15 1416    OBJECTIVE: PHYSICAL EXAM: General  status: Performance status is good.  Patient has not lost significant weight Lungs: Air  entry equal on both sides.  No rhonchi.  No rales.   Cardiac: Heart sounds are  normal.  No pericardial rub.  No murmur. Lymphatic system: Cervical, axillary, inguinal, lymph nodes not palpable GI: Abdomen is soft.  No ascites.  Liver spleen not palpable.  No tenderness.  Bowel sounds are within normal limit Lower extremity: No edema Neurological system: Higher functions, cranial nerves intact no evidence of peripheral neuropathy. Skin: No rash.  No ecchymosis.Danley Danker Vitals:   03/10/15 1404  BP: 152/73  Pulse: 67  Temp: 97.7 F (36.5 C)  Resp: 18     Body mass index is 49.4 kg/(m^2).    ECOG FS:0 - Asymptomatic  LAB RESULTS:  Infusion on 03/10/2015  Component Date Value Ref Range Status  . WBC 03/10/2015 6.9  3.6 - 11.0 K/uL Final  . RBC 03/10/2015 4.41  3.80 - 5.20 MIL/uL Final  . Hemoglobin 03/10/2015 13.4  12.0 - 16.0 g/dL Final  . HCT 03/10/2015 40.3  35.0 - 47.0 % Final  . MCV 03/10/2015 91.4  80.0 - 100.0 fL Final  . MCH 03/10/2015 30.4  26.0 - 34.0 pg Final  . MCHC 03/10/2015 33.3  32.0 - 36.0 g/dL Final  . RDW 03/10/2015 13.6  11.5 - 14.5 % Final  . Platelets 03/10/2015 178  150 - 440 K/uL Final  . Neutrophils Relative % 03/10/2015 57   Final  . Neutro Abs 03/10/2015 4.0  1.4 - 6.5 K/uL Final  . Lymphocytes Relative 03/10/2015 29   Final  . Lymphs Abs 03/10/2015 2.0  1.0 - 3.6 K/uL Final  . Monocytes Relative 03/10/2015 10    Final  . Monocytes Absolute 03/10/2015 0.7  0.2 - 0.9 K/uL Final  . Eosinophils Relative 03/10/2015 3   Final  . Eosinophils Absolute 03/10/2015 0.2  0 - 0.7 K/uL Final  . Basophils Relative 03/10/2015 1   Final  . Basophils Absolute 03/10/2015 0.1  0 - 0.1 K/uL Final  . Sodium 03/10/2015 136  135 - 145 mmol/L Final  . Potassium 03/10/2015 3.7  3.5 - 5.1 mmol/L Final  . Chloride 03/10/2015 103  101 - 111 mmol/L Final  . CO2 03/10/2015 27  22 - 32 mmol/L Final  . Glucose, Bld 03/10/2015 111* 65 - 99 mg/dL Final  . BUN 03/10/2015 22* 6 - 20 mg/dL Final  . Creatinine, Ser 03/10/2015 1.13* 0.44 - 1.00 mg/dL Final  . Calcium 03/10/2015 9.3  8.9 - 10.3 mg/dL Final  . Total Protein 03/10/2015 6.9  6.5 - 8.1 g/dL Final  . Albumin 03/10/2015 3.8  3.5 - 5.0 g/dL Final  . AST 03/10/2015 24  15 - 41 U/L Final  . ALT 03/10/2015 18  14 - 54 U/L Final  . Alkaline Phosphatase 03/10/2015 69  38 - 126 U/L Final  . Total Bilirubin 03/10/2015 0.7  0.3 - 1.2 mg/dL Final  . GFR calc non Af Amer 03/10/2015 47* >60 mL/min Final  . GFR calc Af Amer 03/10/2015 54* >60 mL/min Final   Comment: (NOTE) The eGFR has been calculated using the CKD EPI equation. This calculation has not been validated in all clinical situations. eGFR's persistently <60 mL/min signify possible Chronic Kidney Disease.   . Anion gap 03/10/2015 6  5 - 15 Final   IMPRESSION: Normal LEFT ventricular ejection fraction of 58%.  Normal LEFT ventricular wall motion.  When compared to the previous exam, current calculi LEFT ventricular ejection fraction has mildly decreased from the 65% on the prior study.   Electronically Signed  By: Lavonia Dana M.D.  On: 12/24/2014 11:45   ASSESSMENT/  PLAN:  1. History of carcinoma of breast, stage IB. Sentinel lymph nodes negative, Estrogen receptor progesterone receptor positive HER-2/neu receptor positive. Most recent MUGA scan from October 2016 with 58% ejection fraction. We'll  proceed with Herceptin treatment today. We'll schedule next MUGA scan the first part of February 2016. Patient will return in approximately 3 weeks for Herceptin only. In approximately 5 weeks for her MUGA scan, then in 6 weeks for Herceptin and further evaluation by Dr. Oliva Bustard.   Staging form: Breast, AJCC 7th Edition     Clinical: Stage IA (T1b, N0, M0) - Signed by Forest Gleason, MD on 07/28/2014   Evlyn Kanner, NP   03/10/2015 2:40 PM

## 2015-03-25 ENCOUNTER — Telehealth: Payer: Self-pay | Admitting: General Surgery

## 2015-03-25 ENCOUNTER — Ambulatory Visit
Admission: RE | Admit: 2015-03-25 | Discharge: 2015-03-25 | Disposition: A | Discharge status: Home / Self Care | Payer: Medicare HMO | Source: Ambulatory Visit | Attending: Radiation Oncology | Admitting: Radiation Oncology

## 2015-03-25 ENCOUNTER — Encounter: Payer: Self-pay | Admitting: Radiation Oncology

## 2015-03-25 VITALS — BP 140/78 | HR 71 | Temp 97.0°F | Resp 18 | Wt 284.8 lb

## 2015-03-25 DIAGNOSIS — C50911 Malignant neoplasm of unspecified site of right female breast: Secondary | ICD-10-CM

## 2015-03-25 NOTE — Progress Notes (Signed)
Radiation Oncology Follow up Note  Name: Maria Gutierrez   Date:   03/25/2015 MRN:  [id number] DOB: Dec 18, 1940    This 75 y.o. female presents to the clinic today for follow-up for breast cancer status post accelerated partial breast radiation chemotherapy and currently on Herceptin now 7 months out from radiation treatment.  REFERRING PROVIDER: [name], MD  HPI: Patient is a 75 year old female now 7 months out having completed accelerated partial breast radiation to her right breast for a 1 cm invasive mammary carcinoma ER/PR positive HER-2/neu positive. Seen today in routine follow-up she is doing well has completed her chemotherapy continues on Herceptin maintenance. She specifically denies breast tenderness cough or bone pain. Has not had a follow-up mammogram as of yet. She specifically denies breast tenderness cough or bone pain..  COMPLICATIONS OF TREATMENT: none  FOLLOW UP COMPLIANCE: keeps appointments   PHYSICAL EXAM:  BP 140/78 mmHg  Pulse 71  Temp(Src) 97 F (36.1 C)  Resp 18  Wt 284 lb 13.4 oz (129.2 kg) Lungs are clear to A&P cardiac examination essentially unremarkable with regular rate and rhythm. No dominant mass or nodularity is noted in either breast in 2 positions examined. Incision is well-healed. No axillary or supraclavicular adenopathy is appreciated. Cosmetic result is excellent. Well-developed well-nourished patient in NAD. HEENT reveals PERLA, EOMI, discs not visualized.  Oral cavity is clear. No oral mucosal lesions are identified. Neck is clear without evidence of cervical or supraclavicular adenopathy. Lungs are clear to A&P. Cardiac examination is essentially unremarkable with regular rate and rhythm without murmur rub or thrill. Abdomen is benign with no organomegaly or masses noted. Motor sensory and DTR levels are equal and symmetric in the upper and lower extremities. Cranial nerves II through XII are grossly intact. Proprioception is intact. No  peripheral adenopathy or edema is identified. No motor or sensory levels are noted. Crude visual fields are within normal range.  RADIOLOGY RESULTS: Have contacted surgeon's office to arrange for follow-up mammograms  PLAN: Present time patient is doing well 7 months out from accelerated partial breast radiation. She continues on Herceptin maintenance tolerating that well. She is otherwise doing fine without side effect or complaint. I have contacted her surgeon's office to arrange for follow-up and for ordering of follow-up mammograms. I have asked to see her back in 6 months for follow-up. Patient knows to call sooner with any concerns. She continues close follow-up care and Herceptin treatments with medical oncology.  I would like to take this opportunity for allowing me to participate in the care of your patient.Armstead Peaks., MD

## 2015-03-25 NOTE — Telephone Encounter (Signed)
03-25-15 @ 9:40AM KIM WITH DR [facility] CALLED & WAS CKING TO SEE IF PT NEEDED TO HAVE A F/U APPT.SHE WAS LAST SEEN 08-27-14 FOR PORT PLACEMENT. SHE HAD A LUMPECTOMY DONE ON 07-14-14,P/O 07-21-14,MAMMOSITE 07-29-14 & P/O 08-20-14 & PRE-OP FOR HER PORT. SHE IS NOT IN RECALL'S. DOES SHE NEED AN APPT ? PLEASE NOTIFY KIM @ [phone].IF SHE HASN'T HEARD BACK FROM [place] BY Tuesday 03-31-15 SHE WILL CALL BACK/MTH

## 2015-03-25 NOTE — Telephone Encounter (Deleted)
03-25-15@ 9:40AM [place]

## 2015-03-26 NOTE — Telephone Encounter (Signed)
Patient should have a follow up visit as well as bilateral mammograms in May  2017. Thanks.

## 2015-03-26 NOTE — Telephone Encounter (Signed)
KIM IN DR [facility] WAS NOTIFIED. VOICE MAIL LEFT AT [phone]. PT WAS PUT IN RECALL'S FOR 06-2015 WITH BILATERAL MAMMO./MTH

## 2015-03-31 ENCOUNTER — Inpatient Hospital Stay: Payer: Medicare HMO

## 2015-03-31 VITALS — BP 114/74 | HR 62 | Temp 96.4°F | Resp 18

## 2015-03-31 DIAGNOSIS — C50911 Malignant neoplasm of unspecified site of right female breast: Secondary | ICD-10-CM

## 2015-03-31 DIAGNOSIS — Z5112 Encounter for antineoplastic immunotherapy: Secondary | ICD-10-CM | POA: Diagnosis not present

## 2015-03-31 LAB — CBC WITH DIFFERENTIAL/PLATELET
Basophils Absolute: 0.1 10*3/uL (ref 0–0.1)
Basophils Relative: 1 %
EOS PCT: 2 %
Eosinophils Absolute: 0.1 10*3/uL (ref 0–0.7)
HEMATOCRIT: 40.7 % (ref 35.0–47.0)
Hemoglobin: 13.9 g/dL (ref 12.0–16.0)
LYMPHS ABS: 2.6 10*3/uL (ref 1.0–3.6)
LYMPHS PCT: 33 %
MCH: 30.8 pg (ref 26.0–34.0)
MCHC: 34 g/dL (ref 32.0–36.0)
MCV: 90.6 fL (ref 80.0–100.0)
MONO ABS: 0.7 10*3/uL (ref 0.2–0.9)
Monocytes Relative: 9 %
NEUTROS ABS: 4.4 10*3/uL (ref 1.4–6.5)
Neutrophils Relative %: 55 %
PLATELETS: 175 10*3/uL (ref 150–440)
RBC: 4.49 MIL/uL (ref 3.80–5.20)
RDW: 13.5 % (ref 11.5–14.5)
WBC: 7.8 10*3/uL (ref 3.6–11.0)

## 2015-03-31 MED ORDER — ACETAMINOPHEN 325 MG PO TABS
650.0000 mg | ORAL_TABLET | Freq: Once | ORAL | Status: AC
  Administered 2015-03-31: 650 mg via ORAL
  Filled 2015-03-31: qty 2

## 2015-03-31 MED ORDER — SODIUM CHLORIDE 0.9% FLUSH
10.0000 mL | Freq: Once | INTRAVENOUS | Status: AC
  Administered 2015-03-31: 10 mL via INTRAVENOUS
  Filled 2015-03-31: qty 10

## 2015-03-31 MED ORDER — TRASTUZUMAB CHEMO INJECTION 440 MG
6.0000 mg/kg | Freq: Once | INTRAVENOUS | Status: AC
  Administered 2015-03-31: 798 mg via INTRAVENOUS
  Filled 2015-03-31: qty 38

## 2015-03-31 MED ORDER — SODIUM CHLORIDE 0.9 % IV SOLN
Freq: Once | INTRAVENOUS | Status: AC
  Administered 2015-03-31: 14:00:00 via INTRAVENOUS
  Filled 2015-03-31: qty 1000

## 2015-03-31 MED ORDER — HEPARIN SOD (PORK) LOCK FLUSH 100 UNIT/ML IV SOLN
500.0000 [IU] | Freq: Once | INTRAVENOUS | Status: AC
  Administered 2015-03-31: 500 [IU] via INTRAVENOUS
  Filled 2015-03-31: qty 5

## 2015-04-14 ENCOUNTER — Ambulatory Visit: Admission: RE | Admit: 2015-04-14 | Payer: Medicare HMO | Source: Ambulatory Visit

## 2015-04-14 ENCOUNTER — Encounter
Admission: RE | Admit: 2015-04-14 | Discharge: 2015-04-14 | Disposition: A | Discharge status: Home / Self Care | Payer: Medicare HMO | Source: Ambulatory Visit | Attending: Family Medicine | Admitting: Family Medicine

## 2015-04-14 DIAGNOSIS — C50919 Malignant neoplasm of unspecified site of unspecified female breast: Secondary | ICD-10-CM | POA: Insufficient documentation

## 2015-04-14 MED ORDER — TECHNETIUM TC 99M-LABELED RED BLOOD CELLS IV KIT: 20.0000 | PACK | Freq: Once | INTRAVENOUS | Status: DC | PRN

## 2015-04-21 ENCOUNTER — Encounter: Payer: Self-pay | Admitting: Oncology

## 2015-04-21 ENCOUNTER — Inpatient Hospital Stay: Payer: Medicare HMO

## 2015-04-21 ENCOUNTER — Inpatient Hospital Stay (HOSPITAL_BASED_OUTPATIENT_CLINIC_OR_DEPARTMENT_OTHER): Payer: Medicare HMO | Admitting: Oncology

## 2015-04-21 ENCOUNTER — Other Ambulatory Visit: Payer: Self-pay | Admitting: *Deleted

## 2015-04-21 ENCOUNTER — Inpatient Hospital Stay: Payer: Medicare HMO | Attending: Oncology

## 2015-04-21 VITALS — BP 108/78 | HR 70 | Temp 96.8°F | Resp 180 | Wt 289.5 lb

## 2015-04-21 DIAGNOSIS — I10 Essential (primary) hypertension: Secondary | ICD-10-CM | POA: Diagnosis not present

## 2015-04-21 DIAGNOSIS — K219 Gastro-esophageal reflux disease without esophagitis: Secondary | ICD-10-CM | POA: Insufficient documentation

## 2015-04-21 DIAGNOSIS — E785 Hyperlipidemia, unspecified: Secondary | ICD-10-CM | POA: Insufficient documentation

## 2015-04-21 DIAGNOSIS — Z170 Estrogen receptor positive status [ER+]: Secondary | ICD-10-CM | POA: Insufficient documentation

## 2015-04-21 DIAGNOSIS — Z79899 Other long term (current) drug therapy: Secondary | ICD-10-CM | POA: Insufficient documentation

## 2015-04-21 DIAGNOSIS — Z5112 Encounter for antineoplastic immunotherapy: Secondary | ICD-10-CM | POA: Diagnosis present

## 2015-04-21 DIAGNOSIS — C50919 Malignant neoplasm of unspecified site of unspecified female breast: Secondary | ICD-10-CM

## 2015-04-21 DIAGNOSIS — C50911 Malignant neoplasm of unspecified site of right female breast: Secondary | ICD-10-CM

## 2015-04-21 DIAGNOSIS — M1990 Unspecified osteoarthritis, unspecified site: Secondary | ICD-10-CM | POA: Diagnosis not present

## 2015-04-21 DIAGNOSIS — Z7982 Long term (current) use of aspirin: Secondary | ICD-10-CM | POA: Diagnosis not present

## 2015-04-21 DIAGNOSIS — Z79811 Long term (current) use of aromatase inhibitors: Secondary | ICD-10-CM | POA: Insufficient documentation

## 2015-04-21 LAB — CBC WITH DIFFERENTIAL/PLATELET
BASOS ABS: 0.1 10*3/uL (ref 0–0.1)
BASOS PCT: 1 %
EOS PCT: 3 %
Eosinophils Absolute: 0.2 10*3/uL (ref 0–0.7)
HEMATOCRIT: 39 % (ref 35.0–47.0)
Hemoglobin: 13.3 g/dL (ref 12.0–16.0)
LYMPHS PCT: 28 %
Lymphs Abs: 2.1 10*3/uL (ref 1.0–3.6)
MCH: 31.1 pg (ref 26.0–34.0)
MCHC: 34.2 g/dL (ref 32.0–36.0)
MCV: 90.9 fL (ref 80.0–100.0)
MONO ABS: 0.8 10*3/uL (ref 0.2–0.9)
Monocytes Relative: 11 %
NEUTROS ABS: 4.3 10*3/uL (ref 1.4–6.5)
Neutrophils Relative %: 57 %
PLATELETS: 179 10*3/uL (ref 150–440)
RBC: 4.28 MIL/uL (ref 3.80–5.20)
RDW: 13.8 % (ref 11.5–14.5)
WBC: 7.4 10*3/uL (ref 3.6–11.0)

## 2015-04-21 LAB — COMPREHENSIVE METABOLIC PANEL
ALBUMIN: 3.2 g/dL — AB (ref 3.5–5.0)
ALT: 15 U/L (ref 14–54)
AST: 19 U/L (ref 15–41)
Alkaline Phosphatase: 50 U/L (ref 38–126)
Anion gap: 4 — ABNORMAL LOW (ref 5–15)
BUN: 19 mg/dL (ref 6–20)
CHLORIDE: 114 mmol/L — AB (ref 101–111)
CO2: 22 mmol/L (ref 22–32)
CREATININE: 0.92 mg/dL (ref 0.44–1.00)
Calcium: 7.4 mg/dL — ABNORMAL LOW (ref 8.9–10.3)
GFR calc Af Amer: >60 mL/min (ref >60)
GFR calc non Af Amer: >60 mL/min (ref >60)
GLUCOSE: 97 mg/dL (ref 65–99)
POTASSIUM: 2.8 mmol/L — AB (ref 3.5–5.1)
Sodium: 140 mmol/L (ref 135–145)
Total Bilirubin: 0.8 mg/dL (ref 0.3–1.2)
Total Protein: 5.3 g/dL — ABNORMAL LOW (ref 6.5–8.1)

## 2015-04-21 MED ORDER — POTASSIUM CHLORIDE CRYS ER 20 MEQ PO TBCR: 20.0000 meq | EXTENDED_RELEASE_TABLET | Freq: Every day | ORAL | Status: DC

## 2015-04-21 MED ORDER — SODIUM CHLORIDE 0.9% FLUSH
10.0000 mL | INTRAVENOUS | Status: DC | PRN
Start: 2015-04-21 — End: 2015-04-21
  Administered 2015-04-21: 10 mL via INTRAVENOUS
  Filled 2015-04-21: qty 10

## 2015-04-21 MED ORDER — TRASTUZUMAB CHEMO INJECTION 440 MG
6.0000 mg/kg | Freq: Once | INTRAVENOUS | Status: AC
  Administered 2015-04-21: 798 mg via INTRAVENOUS
  Filled 2015-04-21: qty 38

## 2015-04-21 MED ORDER — LETROZOLE 2.5 MG PO TABS: 2.5000 mg | ORAL_TABLET | Freq: Every day | ORAL | Status: DC

## 2015-04-21 MED ORDER — SODIUM CHLORIDE 0.9 % IV SOLN
Freq: Once | INTRAVENOUS | Status: AC
  Administered 2015-04-21: 15:00:00 via INTRAVENOUS
  Filled 2015-04-21: qty 1000

## 2015-04-21 MED ORDER — ACETAMINOPHEN 325 MG PO TABS
650.0000 mg | ORAL_TABLET | Freq: Once | ORAL | Status: AC
  Administered 2015-04-21: 650 mg via ORAL
  Filled 2015-04-21: qty 2

## 2015-04-21 MED ORDER — HEPARIN SOD (PORK) LOCK FLUSH 100 UNIT/ML IV SOLN
500.0000 [IU] | Freq: Once | INTRAVENOUS | Status: AC
  Administered 2015-04-21: 500 [IU] via INTRAVENOUS
  Filled 2015-04-21: qty 5

## 2015-04-21 MED ORDER — SODIUM CHLORIDE 0.9 % IV SOLN
Freq: Once | INTRAVENOUS | Status: AC
  Administered 2015-04-21: 16:00:00 via INTRAVENOUS
  Filled 2015-04-21: qty 100

## 2015-04-21 NOTE — Progress Notes (Signed)
[place] @ [facility] [phone]) [phone]  Fax:(336) [place]: 21-Mar-1940  MR#: [id number]  JYN#:[id number]  Patient Care Team: [name], MD as PCP - General (Family Medicine) [name], MD as Referring Physician (Family Medicine) Robert Bellow, MD (General Surgery)  CHIEF COMPLAINT:  Chief Complaint  Patient presents with  . Breast Cancer   Oncology History   1.  Carcinoma of right breast.  Status post lumpectomy and sentinel lymph node evaluation.  Tumor size 1 cm.  Estrogen and progesterone receptor positive.  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  Diagnosis in April of 2016 2.  Accelerated partial breast radiation in June of 2016 3.  Weekly Taxol and Herceptin therapy for adjuvant treatment starting from July of 2016 4.Taxol was discontinue in September of 2016 because of side effect of neuropathy Patient was started on maintenance Herceptin therapy from October of 2016     VISIT DIAGNOSIS:     ICD-9-CM ICD-10-CM   1. Invasive ductal carcinoma of breast, stage 1, unspecified laterality (HCC) 174.9 C50.919 potassium chloride SA (K-DUR,KLOR-CON) 20 MEQ tablet     letrozole (FEMARA) 2.5 MG tablet     Comprehensive metabolic panel     Magnesium      Oncology Flowsheet 12/04/2014 12/09/2014 12/30/2014 01/27/2015 02/17/2015 03/10/2015 03/31/2015  Day, Cycle - - - - - - -  dexamethasone (DECADRON) IV 10 mg - - - - - -  ondansetron (ZOFRAN) IJ - - - - - - -  ondansetron (ZOFRAN) IV - - - - - - -  PACLitaxel (TAXOL) IV - - - - - - -  trastuzumab (HERCEPTIN) IV - 6 mg/kg 6 mg/kg 6 mg/kg 6 mg/kg 6 mg/kg 6 mg/kg    INTERVAL HISTORY:  75 year old lady with a history of carcinoma breast on Taxol and Herceptin tolerating treatment very well.  No chills.  No fever.  No nausea.  No vomiting.   Patient is here for ongoing evaluation and treatment consideration No chills fever.  No cough. Patient had a MUGA scan of the heart which was stable  (April 14, 2015 )  REVIEW OF SYSTEMS:     general status: Alert and oriented and not any acute distress.  No change in a performance status.  No chills.  No fever. HEENT: Continues to have congested nose and congested sinuses.  No fever.  Occasional epistaxis. Lungs: No cough or shortness of breath.  Epistaxis as describedin   history of present illness Cardiac: No chest pain or paroxysmal nocturnal dyspnea GI: No nausea no vomiting no diarrhea no abdominal pain Skin: No rash Lower extremity no swelling Neurological system: No tingling.  No numbness.  No other focal signs Musculoskeletal system no bony pains   PAST MEDICAL HISTORY: Past Medical History  Diagnosis Date  . Hyperlipidemia   . Hypertension   . GERD (gastroesophageal reflux disease)   . Arthritis   . Heart murmur   . Anxiety   . Breast cancer of upper-outer quadrant of right female breast ([place]) 06/10/2014    T1b,Nx; ER 90%, PR 50-90%, HER-2/neu 2+, FISH positive.  . SOB (shortness of breath) 12/03/2014    PAST SURGICAL HISTORY: Past Surgical History  Procedure Laterality Date  . Abdominal hysterectomy    . Kneee surgery Left   . Cholecystectomy  2012  . Colonoscopy  2009  . Axillary lymph node biopsy Right 07/14/2014    Procedure: AXILLARY LYMPH NODE BIOPSY;  Surgeon: Robert Bellow, MD;  Location: [facility]  ORS;  Service: General;  Laterality: Right;  . Breast biopsy Left 2015  . 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  . Eye surgery Bilateral 2014  . Joint replacement Left 2001  . Joint replacement Right 2003  . Joint replacement Left 2004    replacement joint broken  . Portacath placement Left 08/27/2014    Procedure: INSERTION PORT-A-CATH;  Surgeon: Robert Bellow, MD;  Location: [facility] ORS;  Service: General;  Laterality: Left;    FAMILY HISTORY There is no significant family history of breast  cancer, ovarian cancer, colon cancer       ADVANCED DIRECTIVES: Patient does not have any advanced healthcare directive. Information has been given.   HEALTH MAINTENANCE: Social History  Substance Use Topics  . Smoking status: Never Smoker   . Smokeless tobacco: Never Used  . Alcohol Use: No      Allergies  Allergen Reactions  . Tape Other (See Comments)    Blisters on skin from tape during recent surgery.    Current Outpatient Prescriptions  Medication Sig Dispense Refill  . aspirin 81 MG tablet Take 81 mg by mouth daily.    . calcium carbonate (OS-CAL) 600 MG TABS tablet Take 600 mg by mouth daily.    . cholecalciferol (VITAMIN D) 1000 UNITS tablet Take 1,000 Units by mouth daily.    . fluticasone (FLONASE) 50 MCG/ACT nasal spray Place 1 spray into both nostrils daily. 16 g 2  . gentamicin ointment (GARAMYCIN) 0.1 %     . hydrochlorothiazide (HYDRODIURIL) 25 MG tablet     . HYDROcodone-acetaminophen (NORCO) 5-325 MG tablet Take 1 tablet by mouth every 6 (six) hours as needed for moderate pain. 30 tablet 0  . lansoprazole (PREVACID) 15 MG capsule Take 15 mg by mouth every morning.     [name] letrozole (FEMARA) 2.5 MG tablet Take 1 tablet (2.5 mg total) by mouth daily. 90 tablet 3  . lidocaine-prilocaine (EMLA) cream Apply 1 application topically as needed. 30 g 3  . loratadine (CLARITIN) 10 MG tablet TAKE ONE TABLET BY MOUTH ONCE DAILY 30 tablet 0  . meloxicam (MOBIC) 15 MG tablet Take 1 tablet (15 mg total) by mouth daily as needed for pain. 30 tablet 3  . metoprolol succinate (TOPROL-XL) 50 MG 24 hr tablet Take 50 mg by mouth every morning. Take with or immediately following a meal.    . Multiple Vitamins-Minerals (MULTIVITAMIN) tablet Take 1 tablet by mouth daily.    . mupirocin ointment (BACTROBAN) 2 %     . Oxycodone HCl 10 MG TABS Take 1 tablet (10 mg total) by mouth every 6 (six) hours as needed. 30 tablet 0  . PARoxetine (PAXIL) 40 MG tablet Take 40 mg by mouth at  bedtime.     . simvastatin (ZOCOR) 40 MG tablet Take 40 mg by mouth at bedtime.     . traMADol (ULTRAM) 50 MG tablet Take by mouth every 6 (six) hours as needed.    . potassium chloride SA (K-DUR,KLOR-CON) 20 MEQ tablet Take 1 tablet (20 mEq total) by mouth daily. 30 tablet 3   No current facility-administered medications for this visit.   Facility-Administered Medications Ordered in Other Visits  Medication Dose Route Frequency Provider Last Rate Last Dose  . acetaminophen (TYLENOL) tablet 650 mg  650 mg Oral Once Forest Gleason, MD      . heparin lock flush 100  unit/mL  500 Units Intravenous Once Forest Gleason, MD      . sodium chloride 0.9 % 100 mL with potassium chloride 20 mEq infusion   Intravenous Once Forest Gleason, MD      . sodium chloride 0.9 % injection 10 mL  10 mL Intravenous PRN Forest Gleason, MD   10 mL at 01/27/15 1416  . sodium chloride flush (NS) 0.9 % injection 10 mL  10 mL Intravenous PRN Forest Gleason, MD   10 mL at 04/21/15 1350  . trastuzumab (HERCEPTIN) 798 mg in sodium chloride 0.9 % 250 mL chemo infusion  6 mg/kg (Treatment Plan Actual) Intravenous Once Forest Gleason, MD        OBJECTIVE: PHYSICAL EXAM: General  status: Performance status is good.  Patient has not lost significant weight HEENT: No evidence of stomatitis. Sclera and conjunctivae :: No jaundice.   pale looking. Evaluation of the nose shows no evidence of ulceration but redness.  Bleeding. Lungs: Air  entry equal on both sides.  No rhonchi.  No rales.  Cardiac: Heart sounds are normal.  No pericardial rub.  No murmur. Lymphatic system: Cervical, axillary, inguinal, lymph nodes not palpable GI: Abdomen is soft.  No ascites.  Liver spleen not palpable.  No tenderness.  Bowel sounds are within normal limit Lower extremity: No edema Neurological system: Higher functions, cranial nerves intact no evidence of peripheral neuropathy. Skin: No rash.  No ecchymosis.. Examination of both breast without any evidence  of palpable mass  Filed Vitals:   04/21/15 1429  BP: 108/78  Pulse: 70  Temp: 96.8 F (36 C)  Resp: 180     Body mass index is 49.66 kg/(m^2).    ECOG FS:0 - Asymptomatic  LAB RESULTS:  Infusion on 04/21/2015  Component Date Value Ref Range Status  . WBC 04/21/2015 7.4  3.6 - 11.0 K/uL Final  . RBC 04/21/2015 4.28  3.80 - 5.20 MIL/uL Final  . Hemoglobin 04/21/2015 13.3  12.0 - 16.0 g/dL Final  . HCT 04/21/2015 39.0  35.0 - 47.0 % Final  . MCV 04/21/2015 90.9  80.0 - 100.0 fL Final  . MCH 04/21/2015 31.1  26.0 - 34.0 pg Final  . MCHC 04/21/2015 34.2  32.0 - 36.0 g/dL Final  . RDW 04/21/2015 13.8  11.5 - 14.5 % Final  . Platelets 04/21/2015 179  150 - 440 K/uL Final  . Neutrophils Relative % 04/21/2015 57   Final  . Neutro Abs 04/21/2015 4.3  1.4 - 6.5 K/uL Final  . Lymphocytes Relative 04/21/2015 28   Final  . Lymphs Abs 04/21/2015 2.1  1.0 - 3.6 K/uL Final  . Monocytes Relative 04/21/2015 11   Final  . Monocytes Absolute 04/21/2015 0.8  0.2 - 0.9 K/uL Final  . Eosinophils Relative 04/21/2015 3   Final  . Eosinophils Absolute 04/21/2015 0.2  0 - 0.7 K/uL Final  . Basophils Relative 04/21/2015 1   Final  . Basophils Absolute 04/21/2015 0.1  0 - 0.1 K/uL Final  . Sodium 04/21/2015 140  135 - 145 mmol/L Final  . Potassium 04/21/2015 2.8* 3.5 - 5.1 mmol/L Final   Comment: RESULTS VERIFIED BY REPEAT TESTING CRITICAL RESULT CALLED TO, READ BACK BY AND VERIFIED WITH NYO TO HAYLEY RHODE 1412 04/21/15   . Chloride 04/21/2015 114* 101 - 111 mmol/L Final  . CO2 04/21/2015 22  22 - 32 mmol/L Final  . Glucose, Bld 04/21/2015 97  65 - 99 mg/dL Final  . BUN 04/21/2015 19  6 - 20 mg/dL Final  . Creatinine, Ser 04/21/2015 0.92  0.44 - 1.00 mg/dL Final  . Calcium 04/21/2015 7.4* 8.9 - 10.3 mg/dL Final  . Total Protein 04/21/2015 5.3* 6.5 - 8.1 g/dL Final  . Albumin 04/21/2015 3.2* 3.5 - 5.0 g/dL Final  . AST 04/21/2015 19  15 - 41 U/L Final  . ALT 04/21/2015 15  14 - 54 U/L Final  .  Alkaline Phosphatase 04/21/2015 50  38 - 126 U/L Final  . Total Bilirubin 04/21/2015 0.8  0.3 - 1.2 mg/dL Final  . GFR calc non Af Amer 04/21/2015 >60  >60 mL/min Final  . GFR calc Af Amer 04/21/2015 >60  >60 mL/min Final   Comment: (NOTE) The eGFR has been calculated using the CKD EPI equation. This calculation has not been validated in all clinical situations. eGFR's persistently <60 mL/min signify possible Chronic Kidney Disease.   . Anion gap 04/21/2015 4* 5 - 15 Final   IMPRESSION: Normal LEFT ventricular ejection fraction of 58%.  Normal LEFT ventricular wall motion.  When compared to the previous exam, current calculi LEFT ventricular ejection fraction has mildly decreased from the 65% on the prior study.   Electronically Signed  By: Lavonia Dana M.D.  On: 12/24/2014 11:45   ASSESSMENT:  75 year old lady with a history of carcinoma of breast stage IB Sentinel lymph nodes negative Estrogen receptor progesterone receptor positive HER-2/neu receptor and  Continue Herceptin therapy  Patient does not have any cough.  PLAN:  All lab data has been reviewed.  MUGA scan of the heart has been reviewed ejection fraction is 60% and has been compared with the previous ejection fraction.  Which is stable.  .  Staging form: Breast, AJCC 7th Edition     Clinical: Stage IA (T1b, N0, M0) - Signed by Forest Gleason, MD on 07/28/2014   Forest Gleason, MD   04/21/2015 3:37 PM

## 2015-04-22 ENCOUNTER — Encounter: Payer: Self-pay | Admitting: Oncology

## 2015-04-22 ENCOUNTER — Other Ambulatory Visit: Payer: Self-pay | Admitting: *Deleted

## 2015-04-22 DIAGNOSIS — C50311 Malignant neoplasm of lower-inner quadrant of right female breast: Secondary | ICD-10-CM

## 2015-04-22 DIAGNOSIS — C50511 Malignant neoplasm of lower-outer quadrant of right female breast: Secondary | ICD-10-CM

## 2015-04-30 ENCOUNTER — Telehealth: Payer: Self-pay | Admitting: *Deleted

## 2015-04-30 NOTE — Telephone Encounter (Signed)
Patient called today with complaints of increasing leg / joint pain.  States it is getting harder for her to get up out of the chair after sitting for any length of time.  States she was not experiencing any leg pain prior to initiation of her Letrozole.  States she has been on the Letrozole for about 3 months.  Discussed with patient to stop the Letrozole for 2 weeks and call me back.  Dr. Oliva Bustard said he would re-evaluate her at that time.  She is agreeable to the plan.

## 2015-05-11 ENCOUNTER — Encounter: Payer: Self-pay | Admitting: *Deleted

## 2015-05-12 ENCOUNTER — Inpatient Hospital Stay: Payer: Medicare HMO

## 2015-05-12 ENCOUNTER — Inpatient Hospital Stay: Payer: Medicare HMO | Attending: Oncology | Admitting: Oncology

## 2015-05-12 ENCOUNTER — Encounter: Payer: Self-pay | Admitting: Oncology

## 2015-05-12 VITALS — BP 111/80 | HR 71 | Temp 97.1°F | Resp 18 | Wt 289.7 lb

## 2015-05-12 DIAGNOSIS — Z79810 Long term (current) use of selective estrogen receptor modulators (SERMs): Secondary | ICD-10-CM | POA: Diagnosis not present

## 2015-05-12 DIAGNOSIS — Z5112 Encounter for antineoplastic immunotherapy: Secondary | ICD-10-CM | POA: Insufficient documentation

## 2015-05-12 DIAGNOSIS — C50911 Malignant neoplasm of unspecified site of right female breast: Secondary | ICD-10-CM

## 2015-05-12 DIAGNOSIS — Z923 Personal history of irradiation: Secondary | ICD-10-CM | POA: Diagnosis not present

## 2015-05-12 DIAGNOSIS — F419 Anxiety disorder, unspecified: Secondary | ICD-10-CM | POA: Diagnosis not present

## 2015-05-12 DIAGNOSIS — Z170 Estrogen receptor positive status [ER+]: Secondary | ICD-10-CM | POA: Insufficient documentation

## 2015-05-12 DIAGNOSIS — C50919 Malignant neoplasm of unspecified site of unspecified female breast: Secondary | ICD-10-CM

## 2015-05-12 DIAGNOSIS — E876 Hypokalemia: Secondary | ICD-10-CM | POA: Insufficient documentation

## 2015-05-12 DIAGNOSIS — K219 Gastro-esophageal reflux disease without esophagitis: Secondary | ICD-10-CM | POA: Diagnosis not present

## 2015-05-12 DIAGNOSIS — I10 Essential (primary) hypertension: Secondary | ICD-10-CM | POA: Diagnosis not present

## 2015-05-12 DIAGNOSIS — E785 Hyperlipidemia, unspecified: Secondary | ICD-10-CM | POA: Diagnosis not present

## 2015-05-12 LAB — COMPREHENSIVE METABOLIC PANEL
ALBUMIN: 3.8 g/dL (ref 3.5–5.0)
ALK PHOS: 62 U/L (ref 38–126)
ALT: 18 U/L (ref 14–54)
ANION GAP: 7 (ref 5–15)
AST: 29 U/L (ref 15–41)
BUN: 22 mg/dL — ABNORMAL HIGH (ref 6–20)
CALCIUM: 8.9 mg/dL (ref 8.9–10.3)
CHLORIDE: 104 mmol/L (ref 101–111)
CO2: 25 mmol/L (ref 22–32)
Creatinine, Ser: 1.22 mg/dL — ABNORMAL HIGH (ref 0.44–1.00)
GFR calc non Af Amer: 43 mL/min — ABNORMAL LOW (ref >60)
GFR, EST AFRICAN AMERICAN: 50 mL/min — AB (ref >60)
GLUCOSE: 121 mg/dL — AB (ref 65–99)
POTASSIUM: 4.2 mmol/L (ref 3.5–5.1)
SODIUM: 136 mmol/L (ref 135–145)
Total Bilirubin: 1.3 mg/dL — ABNORMAL HIGH (ref 0.3–1.2)
Total Protein: 7 g/dL (ref 6.5–8.1)

## 2015-05-12 LAB — CBC WITH DIFFERENTIAL/PLATELET
BASOS ABS: 0.1 10*3/uL (ref 0–0.1)
BASOS PCT: 1 %
EOS ABS: 0.1 10*3/uL (ref 0–0.7)
Eosinophils Relative: 2 %
HCT: 39 % (ref 35.0–47.0)
HEMOGLOBIN: 13.4 g/dL (ref 12.0–16.0)
LYMPHS ABS: 1.9 10*3/uL (ref 1.0–3.6)
Lymphocytes Relative: 25 %
MCH: 31.2 pg (ref 26.0–34.0)
MCHC: 34.3 g/dL (ref 32.0–36.0)
MCV: 91.2 fL (ref 80.0–100.0)
Monocytes Absolute: 0.8 10*3/uL (ref 0.2–0.9)
Monocytes Relative: 11 %
NEUTROS PCT: 61 %
Neutro Abs: 4.7 10*3/uL (ref 1.4–6.5)
PLATELETS: 166 10*3/uL (ref 150–440)
RBC: 4.28 MIL/uL (ref 3.80–5.20)
RDW: 14.5 % (ref 11.5–14.5)
WBC: 7.5 10*3/uL (ref 3.6–11.0)

## 2015-05-12 LAB — MAGNESIUM: Magnesium: 1.7 mg/dL (ref 1.7–2.4)

## 2015-05-12 MED ORDER — HEPARIN SOD (PORK) LOCK FLUSH 100 UNIT/ML IV SOLN
500.0000 [IU] | Freq: Once | INTRAVENOUS | Status: AC
  Administered 2015-05-12: 500 [IU] via INTRAVENOUS
  Filled 2015-05-12: qty 5

## 2015-05-12 MED ORDER — SODIUM CHLORIDE 0.9 % IV SOLN
Freq: Once | INTRAVENOUS | Status: AC
  Administered 2015-05-12: 15:00:00 via INTRAVENOUS
  Filled 2015-05-12: qty 1000

## 2015-05-12 MED ORDER — TRASTUZUMAB CHEMO INJECTION 440 MG
6.0000 mg/kg | Freq: Once | INTRAVENOUS | Status: AC
  Administered 2015-05-12: 798 mg via INTRAVENOUS
  Filled 2015-05-12: qty 38

## 2015-05-12 MED ORDER — TAMOXIFEN CITRATE 20 MG PO TABS: 20.0000 mg | ORAL_TABLET | Freq: Every day | ORAL | Status: DC

## 2015-05-12 MED ORDER — ACETAMINOPHEN 325 MG PO TABS
650.0000 mg | ORAL_TABLET | Freq: Once | ORAL | Status: AC
  Administered 2015-05-12: 650 mg via ORAL
  Filled 2015-05-12: qty 2

## 2015-05-12 MED ORDER — SODIUM CHLORIDE 0.9% FLUSH
10.0000 mL | INTRAVENOUS | Status: DC | PRN
  Administered 2015-05-12: 10 mL via INTRAVENOUS
  Filled 2015-05-12: qty 10

## 2015-05-12 NOTE — Progress Notes (Signed)
[place] @ [facility] [phone]  Fax:(336) [place]: [phone]  MR#: [id number]  QZR#:[id number]  Patient Care Team: [name], MD as PCP - General (Family Medicine) [name], MD as Referring Physician (Family Medicine) Robert Bellow, MD (General Surgery)  CHIEF COMPLAINT:  Chief Complaint  Patient presents with  . Breast Cancer   Oncology History   1.  Carcinoma of right breast.  Status post lumpectomy and sentinel lymph node evaluation.  Tumor size 1 cm.  Estrogen and progesterone receptor positive.  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  Diagnosis in April of 2016 2.  Accelerated partial breast radiation in June of 2016 3.  Weekly Taxol and Herceptin therapy for adjuvant treatment starting from July of 2016 4.Taxol was discontinue in September of 2016 because of side effect of neuropathy Patient was started on maintenance Herceptin therapy from October of 2016  5.Patient has been taken off letrozole because of bony pain and started on tamoxifen 20 mg by mouth daily from May 12, 2015   VISIT DIAGNOSIS:     ICD-9-CM ICD-10-CM   1. Invasive ductal carcinoma of breast, stage 1, unspecified laterality (HCC) 174.9 C50.919 tamoxifen (NOLVADEX) 20 MG tablet     Comprehensive metabolic panel     Potassium     Magnesium      Oncology Flowsheet 12/09/2014 12/30/2014 01/27/2015 02/17/2015 03/10/2015 03/31/2015 04/21/2015  Day, Cycle - - - - - - -  dexamethasone (DECADRON) IV - - - - - - -  ondansetron (ZOFRAN) IJ - - - - - - -  ondansetron (ZOFRAN) IV - - - - - - -  PACLitaxel (TAXOL) IV - - - - - - -  trastuzumab (HERCEPTIN) IV 6 mg/kg 6 mg/kg 6 mg/kg 6 mg/kg 6 mg/kg 6 mg/kg 6 mg/kg    INTERVAL HISTORY:  75 year old lady with a history of carcinoma breast on Taxol and Herceptin tolerating treatment very well.  No chills.  No fever.  No nausea.  No vomiting.   Patient is here for ongoing evaluation and treatment  consideration No chills fever.  No cough. Patient had a MUGA scan of the heart which was stable (April 14, 2015 ) Patient called today with complaints of increasing leg / joint pain.  States it is getting harder for her to get up out of the chair after sitting for any length of time.  States she was not experiencing any leg pain prior to initiation of her Letrozole.  States she has been on the Letrozole for about 3 months.  Discussed with patient to stop the Letrozole for 2 weeks and call me back.  Dr. Oliva Bustard said he would re-evaluate her at that time.  She is agreeable to the plan.  According to patient after stopping letrozole patient started feeling much better.  All the joint pains have resolved.  She letrozole would be discontinued and patient will be started on tamoxifen 20 mg daily Patient is here for ongoing evaluation and continuation of treatment.  Patient is due for mammogram in May of 2017  REVIEW OF SYSTEMS:     general status: Alert and oriented and not any acute distress.  No change in a performance status.  No chills.  No fever. HEENT: Continues to have congested nose and congested sinuses.  No fever.  Occasional epistaxis. Lungs: No cough or shortness of breath.  Epistaxis as describedin   history of present illness Cardiac: No chest pain or paroxysmal nocturnal dyspnea  GI: No nausea no vomiting no diarrhea no abdominal pain Skin: No rash Lower extremity no swelling Neurological system: No tingling.  No numbness.  No other focal signs Musculoskeletal system no bony pains   PAST MEDICAL HISTORY: Past Medical History  Diagnosis Date  . Hyperlipidemia   . Hypertension   . GERD (gastroesophageal reflux disease)   . Arthritis   . Heart murmur   . Anxiety   . Breast cancer of upper-outer quadrant of right female breast ([place]) 06/10/2014    T1b,Nx; ER 90%, PR 50-90%, HER-2/neu 2+, FISH positive.  . SOB (shortness of breath) 12/03/2014    PAST SURGICAL HISTORY: Past  Surgical History  Procedure Laterality Date  . Abdominal hysterectomy    . Kneee surgery Left   . Cholecystectomy  2012  . Colonoscopy  2009  . Axillary lymph node biopsy Right 07/14/2014    Procedure: AXILLARY LYMPH NODE BIOPSY;  Surgeon: Robert Bellow, MD;  Location: [facility] ORS;  Service: General;  Laterality: Right;  . Breast biopsy Left 2015  . 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  . Eye surgery Bilateral 2014  . Joint replacement Left 2001  . Joint replacement Right 2003  . Joint replacement Left 2004    replacement joint broken  . Portacath placement Left 08/27/2014    Procedure: INSERTION PORT-A-CATH;  Surgeon: Robert Bellow, MD;  Location: [facility] ORS;  Service: General;  Laterality: Left;    FAMILY HISTORY There is no significant family history of breast cancer, ovarian cancer, colon cancer       ADVANCED DIRECTIVES: Patient does not have any advanced healthcare directive. Information has been given.   HEALTH MAINTENANCE: Social History  Substance Use Topics  . Smoking status: Never Smoker   . Smokeless tobacco: Never Used  . Alcohol Use: No      Allergies  Allergen Reactions  . Tape Other (See Comments)    Blisters on skin from tape during recent surgery.    Current Outpatient Prescriptions  Medication Sig Dispense Refill  . aspirin 81 MG tablet Take 81 mg by mouth daily.    . calcium carbonate (OS-CAL) 600 MG TABS tablet Take 600 mg by mouth daily.    . cholecalciferol (VITAMIN D) 1000 UNITS tablet Take 1,000 Units by mouth daily.    . fluticasone (FLONASE) 50 MCG/ACT nasal spray Place 1 spray into both nostrils daily. 16 g 2  . gentamicin ointment (GARAMYCIN) 0.1 %     . hydrochlorothiazide (HYDRODIURIL) 25 MG tablet     . HYDROcodone-acetaminophen (NORCO) 5-325 MG tablet Take 1 tablet by mouth every 6 (six) hours as needed for moderate pain.  30 tablet 0  . lansoprazole (PREVACID) 15 MG capsule Take 15 mg by mouth every morning.     . lidocaine-prilocaine (EMLA) cream Apply 1 application topically as needed. 30 g 3  . loratadine (CLARITIN) 10 MG tablet TAKE ONE TABLET BY MOUTH ONCE DAILY 30 tablet 0  . meloxicam (MOBIC) 15 MG tablet Take 1 tablet (15 mg total) by mouth daily as needed for pain. 30 tablet 3  . metoprolol succinate (TOPROL-XL) 50 MG 24 hr tablet Take 50 mg by mouth every morning. Take with or immediately following a meal.    . Multiple Vitamins-Minerals (MULTIVITAMIN) tablet Take 1 tablet by mouth daily.    . mupirocin ointment (BACTROBAN) 2 %     . Oxycodone  HCl 10 MG TABS Take 1 tablet (10 mg total) by mouth every 6 (six) hours as needed. 30 tablet 0  . PARoxetine (PAXIL) 40 MG tablet Take 40 mg by mouth at bedtime.     . simvastatin (ZOCOR) 40 MG tablet Take 40 mg by mouth at bedtime.     . traMADol (ULTRAM) 50 MG tablet Take by mouth every 6 (six) hours as needed.    . tamoxifen (NOLVADEX) 20 MG tablet Take 1 tablet (20 mg total) by mouth daily. 30 tablet 3   No current facility-administered medications for this visit.   Facility-Administered Medications Ordered in Other Visits  Medication Dose Route Frequency Provider Last Rate Last Dose  . heparin lock flush 100 unit/mL  500 Units Intravenous Once Forest Gleason, MD      . sodium chloride 0.9 % injection 10 mL  10 mL Intravenous PRN Forest Gleason, MD   10 mL at 01/27/15 1416  . sodium chloride flush (NS) 0.9 % injection 10 mL  10 mL Intravenous PRN Forest Gleason, MD        OBJECTIVE: PHYSICAL EXAM: General  status: Performance status is good.  Patient has not lost significant weight HEENT: No evidence of stomatitis. Sclera and conjunctivae :: No jaundice.   pale looking. Evaluation of the nose shows no evidence of ulceration but redness.  Bleeding. Lungs: Air  entry equal on both sides.  No rhonchi.  No rales.  Cardiac: Heart sounds are normal.  No pericardial  rub.  No murmur. Lymphatic system: Cervical, axillary, inguinal, lymph nodes not palpable GI: Abdomen is soft.  No ascites.  Liver spleen not palpable.  No tenderness.  Bowel sounds are within normal limit Lower extremity: No edema Neurological system: Higher functions, cranial nerves intact no evidence of peripheral neuropathy. Skin: No rash.  No ecchymosis.. Examination of both breast without any evidence of palpable mass  Filed Vitals:   05/12/15 1400  BP: 111/80  Pulse: 71  Temp: 97.1 F (36.2 C)  Resp: 18     Body mass index is 49.7 kg/(m^2).    ECOG FS:0 - Asymptomatic  LAB RESULTS:  Infusion on 05/12/2015  Component Date Value Ref Range Status  . WBC 05/12/2015 7.5  3.6 - 11.0 K/uL Final  . RBC 05/12/2015 4.28  3.80 - 5.20 MIL/uL Final  . Hemoglobin 05/12/2015 13.4  12.0 - 16.0 g/dL Final  . HCT 05/12/2015 39.0  35.0 - 47.0 % Final  . MCV 05/12/2015 91.2  80.0 - 100.0 fL Final  . MCH 05/12/2015 31.2  26.0 - 34.0 pg Final  . MCHC 05/12/2015 34.3  32.0 - 36.0 g/dL Final  . RDW 05/12/2015 14.5  11.5 - 14.5 % Final  . Platelets 05/12/2015 166  150 - 440 K/uL Final  . Neutrophils Relative % 05/12/2015 61   Final  . Neutro Abs 05/12/2015 4.7  1.4 - 6.5 K/uL Final  . Lymphocytes Relative 05/12/2015 25   Final  . Lymphs Abs 05/12/2015 1.9  1.0 - 3.6 K/uL Final  . Monocytes Relative 05/12/2015 11   Final  . Monocytes Absolute 05/12/2015 0.8  0.2 - 0.9 K/uL Final  . Eosinophils Relative 05/12/2015 2   Final  . Eosinophils Absolute 05/12/2015 0.1  0 - 0.7 K/uL Final  . Basophils Relative 05/12/2015 1   Final  . Basophils Absolute 05/12/2015 0.1  0 - 0.1 K/uL Final  . Sodium 05/12/2015 136  135 - 145 mmol/L Final  . Potassium 05/12/2015 4.2  3.5 -  5.1 mmol/L Final  . Chloride 05/12/2015 104  101 - 111 mmol/L Final  . CO2 05/12/2015 25  22 - 32 mmol/L Final  . Glucose, Bld 05/12/2015 121* 65 - 99 mg/dL Final  . BUN 05/12/2015 22* 6 - 20 mg/dL Final  . Creatinine, Ser  05/12/2015 1.22* 0.44 - 1.00 mg/dL Final  . Calcium 05/12/2015 8.9  8.9 - 10.3 mg/dL Final  . Total Protein 05/12/2015 7.0  6.5 - 8.1 g/dL Final  . Albumin 05/12/2015 3.8  3.5 - 5.0 g/dL Final  . AST 05/12/2015 29  15 - 41 U/L Final  . ALT 05/12/2015 18  14 - 54 U/L Final  . Alkaline Phosphatase 05/12/2015 62  38 - 126 U/L Final  . Total Bilirubin 05/12/2015 1.3* 0.3 - 1.2 mg/dL Final  . GFR calc non Af Amer 05/12/2015 43* >60 mL/min Final  . GFR calc Af Amer 05/12/2015 50* >60 mL/min Final   Comment: (NOTE) The eGFR has been calculated using the CKD EPI equation. This calculation has not been validated in all clinical situations. eGFR's persistently <60 mL/min signify possible Chronic Kidney Disease.   . Anion gap 05/12/2015 7  5 - 15 Final  . Magnesium 05/12/2015 1.7  1.7 - 2.4 mg/dL Final   IMPRESSION: Normal LEFT ventricular ejection fraction of 58%.  Normal LEFT ventricular wall motion.  When compared to the previous exam, current calculi LEFT ventricular ejection fraction has mildly decreased from the 65% on the prior study.   Electronically Signed  By: Lavonia Dana M.D.  On: 12/24/2014 11:45   ASSESSMENT:  75 year old lady with a history of carcinoma of breast stage IB Sentinel lymph nodes negative Estrogen receptor progesterone receptor positive HER-2/neu receptor and  Continue Herceptin therapy Cause of significant systemic symptoms of bony pain and joint pains patient has been taken off letrozole and there was dramatic relief in bony pain so patient will be started on tamoxifen 20 mg daily .   Hypokalemia: Pitts potassium is 4.2 now she potassium.  We will be discontinued Mammogram is been scheduled in May of 2017 Patient will continue Herceptin 6 this time and repeated in 3 weeks and I will reevaluate patient in 6 weeks Magnesium is slightly low 1.7 we will continue to observe that patient is asymptomatic  .  Staging form: Breast, AJCC 7th Edition      Clinical: Stage IA (T1b, N0, M0) - Signed by Forest Gleason, MD on 07/28/2014   Forest Gleason, MD   05/12/2015 2:16 PM

## 2015-06-02 ENCOUNTER — Inpatient Hospital Stay: Payer: Medicare HMO | Attending: Oncology

## 2015-06-02 ENCOUNTER — Inpatient Hospital Stay: Payer: Medicare HMO

## 2015-06-02 VITALS — BP 118/76 | HR 64 | Temp 97.5°F | Resp 18

## 2015-06-02 DIAGNOSIS — E785 Hyperlipidemia, unspecified: Secondary | ICD-10-CM | POA: Insufficient documentation

## 2015-06-02 DIAGNOSIS — M1990 Unspecified osteoarthritis, unspecified site: Secondary | ICD-10-CM | POA: Insufficient documentation

## 2015-06-02 DIAGNOSIS — F419 Anxiety disorder, unspecified: Secondary | ICD-10-CM | POA: Diagnosis not present

## 2015-06-02 DIAGNOSIS — C50911 Malignant neoplasm of unspecified site of right female breast: Secondary | ICD-10-CM

## 2015-06-02 DIAGNOSIS — Z79899 Other long term (current) drug therapy: Secondary | ICD-10-CM | POA: Insufficient documentation

## 2015-06-02 DIAGNOSIS — K219 Gastro-esophageal reflux disease without esophagitis: Secondary | ICD-10-CM | POA: Insufficient documentation

## 2015-06-02 DIAGNOSIS — C50919 Malignant neoplasm of unspecified site of unspecified female breast: Secondary | ICD-10-CM

## 2015-06-02 DIAGNOSIS — Z5112 Encounter for antineoplastic immunotherapy: Secondary | ICD-10-CM | POA: Diagnosis present

## 2015-06-02 DIAGNOSIS — I10 Essential (primary) hypertension: Secondary | ICD-10-CM | POA: Diagnosis not present

## 2015-06-02 DIAGNOSIS — Z170 Estrogen receptor positive status [ER+]: Secondary | ICD-10-CM | POA: Diagnosis not present

## 2015-06-02 DIAGNOSIS — Z79810 Long term (current) use of selective estrogen receptor modulators (SERMs): Secondary | ICD-10-CM | POA: Insufficient documentation

## 2015-06-02 LAB — CBC WITH DIFFERENTIAL/PLATELET
BASOS PCT: 2 %
Basophils Absolute: 0.2 10*3/uL — ABNORMAL HIGH (ref 0–0.1)
EOS ABS: 0.1 10*3/uL (ref 0–0.7)
EOS PCT: 1 %
HEMATOCRIT: 39.2 % (ref 35.0–47.0)
Hemoglobin: 13.5 g/dL (ref 12.0–16.0)
Lymphocytes Relative: 21 %
Lymphs Abs: 2.1 10*3/uL (ref 1.0–3.6)
MCH: 31.4 pg (ref 26.0–34.0)
MCHC: 34.5 g/dL (ref 32.0–36.0)
MCV: 91 fL (ref 80.0–100.0)
MONO ABS: 0.6 10*3/uL (ref 0.2–0.9)
MONOS PCT: 7 %
NEUTROS ABS: 7 10*3/uL — AB (ref 1.4–6.5)
Neutrophils Relative %: 69 %
Platelets: 182 10*3/uL (ref 150–440)
RBC: 4.31 MIL/uL (ref 3.80–5.20)
RDW: 14.2 % (ref 11.5–14.5)
WBC: 10 10*3/uL (ref 3.6–11.0)

## 2015-06-02 LAB — MAGNESIUM: MAGNESIUM: 1.8 mg/dL (ref 1.7–2.4)

## 2015-06-02 LAB — POTASSIUM: Potassium: 3.7 mmol/L (ref 3.5–5.1)

## 2015-06-02 MED ORDER — SODIUM CHLORIDE 0.9% FLUSH
10.0000 mL | INTRAVENOUS | Status: DC | PRN
  Administered 2015-06-02: 10 mL via INTRAVENOUS
  Filled 2015-06-02: qty 10

## 2015-06-02 MED ORDER — HEPARIN SOD (PORK) LOCK FLUSH 100 UNIT/ML IV SOLN
500.0000 [IU] | Freq: Once | INTRAVENOUS | Status: AC
  Administered 2015-06-02: 500 [IU] via INTRAVENOUS
  Filled 2015-06-02: qty 5

## 2015-06-02 MED ORDER — SODIUM CHLORIDE 0.9 % IV SOLN
6.0000 mg/kg | Freq: Once | INTRAVENOUS | Status: AC
  Administered 2015-06-02: 798 mg via INTRAVENOUS
  Filled 2015-06-02: qty 38

## 2015-06-02 MED ORDER — ACETAMINOPHEN 325 MG PO TABS
650.0000 mg | ORAL_TABLET | Freq: Once | ORAL | Status: AC
  Administered 2015-06-02: 650 mg via ORAL
  Filled 2015-06-02: qty 2

## 2015-06-02 MED ORDER — SODIUM CHLORIDE 0.9 % IV SOLN
Freq: Once | INTRAVENOUS | Status: AC
  Administered 2015-06-02: 14:00:00 via INTRAVENOUS
  Filled 2015-06-02: qty 1000

## 2015-06-23 ENCOUNTER — Encounter: Payer: Self-pay | Admitting: Oncology

## 2015-06-23 ENCOUNTER — Inpatient Hospital Stay: Payer: Medicare HMO

## 2015-06-23 ENCOUNTER — Inpatient Hospital Stay (HOSPITAL_BASED_OUTPATIENT_CLINIC_OR_DEPARTMENT_OTHER): Payer: Medicare HMO | Admitting: Oncology

## 2015-06-23 VITALS — BP 151/85 | HR 66 | Temp 97.0°F | Resp 18 | Wt 291.9 lb

## 2015-06-23 DIAGNOSIS — Z79810 Long term (current) use of selective estrogen receptor modulators (SERMs): Secondary | ICD-10-CM | POA: Diagnosis not present

## 2015-06-23 DIAGNOSIS — Z170 Estrogen receptor positive status [ER+]: Secondary | ICD-10-CM | POA: Diagnosis not present

## 2015-06-23 DIAGNOSIS — C50911 Malignant neoplasm of unspecified site of right female breast: Secondary | ICD-10-CM | POA: Diagnosis not present

## 2015-06-23 DIAGNOSIS — C50919 Malignant neoplasm of unspecified site of unspecified female breast: Secondary | ICD-10-CM

## 2015-06-23 DIAGNOSIS — Z5112 Encounter for antineoplastic immunotherapy: Secondary | ICD-10-CM | POA: Diagnosis not present

## 2015-06-23 LAB — CBC WITH DIFFERENTIAL/PLATELET
BASOS PCT: 1 %
Basophils Absolute: 0 10*3/uL (ref 0–0.1)
EOS ABS: 0.2 10*3/uL (ref 0–0.7)
Eosinophils Relative: 2 %
HCT: 38 % (ref 35.0–47.0)
HEMOGLOBIN: 13.2 g/dL (ref 12.0–16.0)
Lymphocytes Relative: 32 %
Lymphs Abs: 2.2 10*3/uL (ref 1.0–3.6)
MCH: 32.1 pg (ref 26.0–34.0)
MCHC: 34.7 g/dL (ref 32.0–36.0)
MCV: 92.5 fL (ref 80.0–100.0)
MONOS PCT: 11 %
Monocytes Absolute: 0.7 10*3/uL (ref 0.2–0.9)
NEUTROS PCT: 54 %
Neutro Abs: 3.6 10*3/uL (ref 1.4–6.5)
Platelets: 170 10*3/uL (ref 150–440)
RBC: 4.1 MIL/uL (ref 3.80–5.20)
RDW: 13.7 % (ref 11.5–14.5)
WBC: 6.7 10*3/uL (ref 3.6–11.0)

## 2015-06-23 LAB — COMPREHENSIVE METABOLIC PANEL
ALK PHOS: 53 U/L (ref 38–126)
ALT: 16 U/L (ref 14–54)
AST: 26 U/L (ref 15–41)
Albumin: 3.7 g/dL (ref 3.5–5.0)
Anion gap: 9 (ref 5–15)
BILIRUBIN TOTAL: 1 mg/dL (ref 0.3–1.2)
BUN: 20 mg/dL (ref 6–20)
CALCIUM: 9.1 mg/dL (ref 8.9–10.3)
CO2: 27 mmol/L (ref 22–32)
CREATININE: 1.22 mg/dL — AB (ref 0.44–1.00)
Chloride: 104 mmol/L (ref 101–111)
GFR, EST AFRICAN AMERICAN: 50 mL/min — AB (ref >60)
GFR, EST NON AFRICAN AMERICAN: 43 mL/min — AB (ref >60)
Glucose, Bld: 112 mg/dL — ABNORMAL HIGH (ref 65–99)
Potassium: 4 mmol/L (ref 3.5–5.1)
SODIUM: 140 mmol/L (ref 135–145)
Total Protein: 6.6 g/dL (ref 6.5–8.1)

## 2015-06-23 MED ORDER — ACETAMINOPHEN 325 MG PO TABS
650.0000 mg | ORAL_TABLET | Freq: Once | ORAL | Status: AC
  Administered 2015-06-23: 650 mg via ORAL
  Filled 2015-06-23: qty 2

## 2015-06-23 MED ORDER — HEPARIN SOD (PORK) LOCK FLUSH 100 UNIT/ML IV SOLN
500.0000 [IU] | Freq: Once | INTRAVENOUS | Status: AC | PRN
  Administered 2015-06-23: 500 [IU]
  Filled 2015-06-23: qty 5

## 2015-06-23 MED ORDER — TRASTUZUMAB CHEMO INJECTION 440 MG
6.0000 mg/kg | Freq: Once | INTRAVENOUS | Status: AC
  Administered 2015-06-23: 798 mg via INTRAVENOUS
  Filled 2015-06-23: qty 38

## 2015-06-23 MED ORDER — SODIUM CHLORIDE 0.9 % IV SOLN
Freq: Once | INTRAVENOUS | Status: AC
  Administered 2015-06-23: 15:00:00 via INTRAVENOUS
  Filled 2015-06-23: qty 1000

## 2015-06-23 NOTE — Progress Notes (Signed)
[place] @ [facility] [phone]  Fax:([phone]) [place]: 11/15/1940  MR#: [id number]  EGB#:[id number]  Patient Care Team: [name], MD as PCP - General (Family Medicine) [name], MD as Referring Physician (Family Medicine) Robert Bellow, MD (General Surgery)  CHIEF COMPLAINT:  Chief Complaint  Patient presents with  . Breast Cancer   Oncology History   1.  Carcinoma of right breast.  Status post lumpectomy and sentinel lymph node evaluation.  Tumor size 1 cm.  Estrogen and progesterone receptor positive.  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  Diagnosis in April of 2016 2.  Accelerated partial breast radiation in June of 2016 3.  Weekly Taxol and Herceptin therapy for adjuvant treatment starting from July of 2016 4.Taxol was discontinue in September of 2016 because of side effect of neuropathy Patient was started on maintenance Herceptin therapy from October of 2016  5.Patient has been taken off letrozole because of bony pain and started on tamoxifen 20 mg by mouth daily from May 12, 2015   VISIT DIAGNOSIS:   No diagnosis found.    Oncology Flowsheet 01/27/2015 02/17/2015 03/10/2015 03/31/2015 04/21/2015 05/12/2015 06/02/2015  Day, Cycle - - - - - - -  dexamethasone (DECADRON) IV - - - - - - -  ondansetron (ZOFRAN) IJ - - - - - - -  ondansetron (ZOFRAN) IV - - - - - - -  PACLitaxel (TAXOL) IV - - - - - - -  trastuzumab (HERCEPTIN) IV 6 mg/kg 6 mg/kg 6 mg/kg 6 mg/kg 6 mg/kg 6 mg/kg 6 mg/kg    INTERVAL HISTORY:  75 year old lady with a history of carcinoma breast on Taxol and Herceptin tolerating treatment very well.  No chills.  No fever.  No nausea.  No vomiting.   Patient is here for ongoing evaluation and treatment consideration No chills fever.  No cough. Patient had a MUGA scan of the heart which was stable (April 14, 2015 ) She is taking tamoxifen and tolerating well.  For ongoing evaluation and continuation of  maintenance Herceptin therapy   According to patient after stopping letrozole patient started feeling much better.  All the joint pains have resolved.  She letrozole would be discontinued and patient will be started on tamoxifen 20 mg daily Patient is here for ongoing evaluation and continuation of treatment.  Patient is due for mammogram in May of 2017  REVIEW OF SYSTEMS:     general status: Alert and oriented and not any acute distress.  No change in a performance status.  No chills.  No fever. HEENT: Continues to have congested nose and congested sinuses.  No fever.  Occasional epistaxis. Lungs: No cough or shortness of breath.  Epistaxis as describedin   history of present illness Cardiac: No chest pain or paroxysmal nocturnal dyspnea GI: No nausea no vomiting no diarrhea no abdominal pain Skin: No rash Lower extremity no swelling Neurological system: No tingling.  No numbness.  No other focal signs Musculoskeletal system no bony pains   PAST MEDICAL HISTORY: Past Medical History  Diagnosis Date  . Hyperlipidemia   . Hypertension   . GERD (gastroesophageal reflux disease)   . Arthritis   . Heart murmur   . Anxiety   . Breast cancer of upper-outer quadrant of right female breast ([facility]) 06/10/2014    T1b,Nx; ER 90%, PR 50-90%, HER-2/neu 2+, FISH positive.  . SOB (shortness of breath) 12/03/2014    PAST SURGICAL HISTORY: Past Surgical History  Procedure Laterality Date  . Abdominal hysterectomy    . [place] surgery Left   . Cholecystectomy  2012  . Colonoscopy  2009  . Axillary lymph node biopsy Right 07/14/2014    Procedure: AXILLARY LYMPH NODE BIOPSY;  Surgeon: Robert Bellow, MD;  Location: [facility] ORS;  Service: General;  Laterality: Right;  . Breast biopsy Left 2015  . 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  . Eye surgery Bilateral 2014  . Joint replacement Left  2001  . Joint replacement Right 2003  . Joint replacement Left 2004    replacement joint broken  . Portacath placement Left 08/27/2014    Procedure: INSERTION PORT-A-CATH;  Surgeon: Robert Bellow, MD;  Location: [facility] ORS;  Service: General;  Laterality: Left;    FAMILY HISTORY There is no significant family history of breast cancer, ovarian cancer, colon cancer       ADVANCED DIRECTIVES: Patient does not have any advanced healthcare directive. Information has been given.   HEALTH MAINTENANCE: Social History  Substance Use Topics  . Smoking status: Never Smoker   . Smokeless tobacco: Never Used  . Alcohol Use: No      Allergies  Allergen Reactions  . Tape Other (See Comments)    Blisters on skin from tape during recent surgery.    Current Outpatient Prescriptions  Medication Sig Dispense Refill  . aspirin 81 MG tablet Take 81 mg by mouth daily.    . calcium carbonate (OS-CAL) 600 MG TABS tablet Take 600 mg by mouth daily.    . cholecalciferol (VITAMIN D) 1000 UNITS tablet Take 1,000 Units by mouth daily.    . fluticasone (FLONASE) 50 MCG/ACT nasal spray Place 1 spray into both nostrils daily. 16 g 2  . gentamicin ointment (GARAMYCIN) 0.1 %     . hydrochlorothiazide (HYDRODIURIL) 25 MG tablet     . HYDROcodone-acetaminophen (NORCO) 5-325 MG tablet Take 1 tablet by mouth every 6 (six) hours as needed for moderate pain. 30 tablet 0  . lansoprazole (PREVACID) 15 MG capsule Take 15 mg by mouth every morning.     . lidocaine-prilocaine (EMLA) cream Apply 1 application topically as needed. 30 g 3  . loratadine (CLARITIN) 10 MG tablet TAKE ONE TABLET BY MOUTH ONCE DAILY 30 tablet 0  . meloxicam (MOBIC) 15 MG tablet Take 1 tablet (15 mg total) by mouth daily as needed for pain. 30 tablet 3  . metoprolol succinate (TOPROL-XL) 50 MG 24 hr tablet Take 50 mg by mouth every morning. Take with or immediately following a meal.    . Multiple Vitamins-Minerals (MULTIVITAMIN) tablet  Take 1 tablet by mouth daily.    . mupirocin ointment (BACTROBAN) 2 %     . Oxycodone HCl 10 MG TABS Take 1 tablet (10 mg total) by mouth every 6 (six) hours as needed. 30 tablet 0  . PARoxetine (PAXIL) 40 MG tablet Take 40 mg by mouth at bedtime.     . simvastatin (ZOCOR) 40 MG tablet Take 40 mg by mouth at bedtime.     . tamoxifen (NOLVADEX) 20 MG tablet Take 1 tablet (20 mg total) by mouth daily. 30 tablet 3  . traMADol (ULTRAM) 50 MG tablet Take by mouth every 6 (six) hours as needed.     No current facility-administered medications for this visit.   Facility-Administered Medications Ordered in Other Visits  Medication Dose Route Frequency Provider  Last Rate Last Dose  . sodium chloride 0.9 % injection 10 mL  10 mL Intravenous PRN Forest Gleason, MD   10 mL at 01/27/15 1416    OBJECTIVE: PHYSICAL EXAM: General  status: Performance status is good.  Patient has not lost significant weight HEENT: No evidence of stomatitis. Sclera and conjunctivae :: No jaundice.   pale looking. Evaluation of the nose shows no evidence of ulceration but redness.  Bleeding. Lungs: Air  entry equal on both sides.  No rhonchi.  No rales.  Cardiac: Heart sounds are normal.  No pericardial rub.  No murmur. Lymphatic system: Cervical, axillary, inguinal, lymph nodes not palpable GI: Abdomen is soft.  No ascites.  Liver spleen not palpable.  No tenderness.  Bowel sounds are within normal limit Lower extremity: No edema Neurological system: Higher functions, cranial nerves intact no evidence of peripheral neuropathy. Skin: No rash.  No ecchymosis.. Examination of both breast without any evidence of palpable mass  Filed Vitals:   06/23/15 1439  BP: 151/85  Pulse: 66  Temp: 97 F (36.1 C)  Resp: 18     Body mass index is 50.08 kg/(m^2).    ECOG FS:0 - Asymptomatic  LAB RESULTS:  Appointment on 06/23/2015  Component Date Value Ref Range Status  . Sodium 06/23/2015 140  135 - 145 mmol/L Final  . Potassium  06/23/2015 4.0  3.5 - 5.1 mmol/L Final  . Chloride 06/23/2015 104  101 - 111 mmol/L Final  . CO2 06/23/2015 27  22 - 32 mmol/L Final  . Glucose, Bld 06/23/2015 112* 65 - 99 mg/dL Final  . BUN 06/23/2015 20  6 - 20 mg/dL Final  . Creatinine, Ser 06/23/2015 1.22* 0.44 - 1.00 mg/dL Final  . Calcium 06/23/2015 9.1  8.9 - 10.3 mg/dL Final  . Total Protein 06/23/2015 6.6  6.5 - 8.1 g/dL Final  . Albumin 06/23/2015 3.7  3.5 - 5.0 g/dL Final  . AST 06/23/2015 26  15 - 41 U/L Final  . ALT 06/23/2015 16  14 - 54 U/L Final  . Alkaline Phosphatase 06/23/2015 53  38 - 126 U/L Final  . Total Bilirubin 06/23/2015 1.0  0.3 - 1.2 mg/dL Final  . GFR calc non Af Amer 06/23/2015 43* >60 mL/min Final  . GFR calc Af Amer 06/23/2015 50* >60 mL/min Final   Comment: (NOTE) The eGFR has been calculated using the CKD EPI equation. This calculation has not been validated in all clinical situations. eGFR's persistently <60 mL/min signify possible Chronic Kidney Disease.   . Anion gap 06/23/2015 9  5 - 15 Final  . WBC 06/23/2015 6.7  3.6 - 11.0 K/uL Final  . RBC 06/23/2015 4.10  3.80 - 5.20 MIL/uL Final  . Hemoglobin 06/23/2015 13.2  12.0 - 16.0 g/dL Final  . HCT 06/23/2015 38.0  35.0 - 47.0 % Final  . MCV 06/23/2015 92.5  80.0 - 100.0 fL Final  . MCH 06/23/2015 32.1  26.0 - 34.0 pg Final  . MCHC 06/23/2015 34.7  32.0 - 36.0 g/dL Final  . RDW 06/23/2015 13.7  11.5 - 14.5 % Final  . Platelets 06/23/2015 170  150 - 440 K/uL Final  . Neutrophils Relative % 06/23/2015 54   Final  . Neutro Abs 06/23/2015 3.6  1.4 - 6.5 K/uL Final  . Lymphocytes Relative 06/23/2015 32   Final  . Lymphs Abs 06/23/2015 2.2  1.0 - 3.6 K/uL Final  . Monocytes Relative 06/23/2015 11   Final  . Monocytes Absolute  06/23/2015 0.7  0.2 - 0.9 K/uL Final  . Eosinophils Relative 06/23/2015 2   Final  . Eosinophils Absolute 06/23/2015 0.2  0 - 0.7 K/uL Final  . Basophils Relative 06/23/2015 1   Final  . Basophils Absolute 06/23/2015 0.0  0 -  0.1 K/uL Final   IMPRESSION: Carcinoma breast there is no evidence of recurrent or progressive disease MAMMOGRAM  of Jun 29, 2015 BIRAD-2 Electronically Signed  By: Lavonia Dana M.D.  On: 12/24/2014 11:45   ASSESSMENT:  75 year old lady with a history of carcinoma of breast stage IB Sentinel lymph nodes negative Estrogen receptor progesterone receptor positive HER-2/neu receptor and  Continue Herceptin therapy ON TAMOXIFEN TOLERATING WELL mAMMOGRAM HAS BEEN REPORTED TO BE NEGATIVE (mAY OF 2017)  .  Staging form: Breast, AJCC 7th Edition     Clinical: Stage IA (T1b, N0, M0) - Signed by Forest Gleason, MD on 07/28/2014   Forest Gleason, MD   06/23/2015 2:52 PM

## 2015-06-29 ENCOUNTER — Ambulatory Visit
Admission: RE | Admit: 2015-06-29 | Discharge: 2015-06-29 | Disposition: A | Discharge status: Home / Self Care | Payer: Medicare HMO | Source: Ambulatory Visit | Attending: General Surgery | Admitting: General Surgery

## 2015-06-29 ENCOUNTER — Encounter: Payer: Self-pay | Admitting: Oncology

## 2015-06-29 ENCOUNTER — Other Ambulatory Visit: Payer: Self-pay | Admitting: General Surgery

## 2015-06-29 DIAGNOSIS — C50511 Malignant neoplasm of lower-outer quadrant of right female breast: Secondary | ICD-10-CM

## 2015-06-29 DIAGNOSIS — Z853 Personal history of malignant neoplasm of breast: Secondary | ICD-10-CM | POA: Diagnosis present

## 2015-07-06 ENCOUNTER — Ambulatory Visit (INDEPENDENT_AMBULATORY_CARE_PROVIDER_SITE_OTHER): Payer: Medicare HMO | Admitting: General Surgery

## 2015-07-06 ENCOUNTER — Encounter: Payer: Self-pay | Admitting: General Surgery

## 2015-07-06 VITALS — BP 132/76 | HR 76 | Resp 12 | Ht 63.0 in | Wt 280.0 lb

## 2015-07-06 DIAGNOSIS — C50911 Malignant neoplasm of unspecified site of right female breast: Secondary | ICD-10-CM | POA: Diagnosis not present

## 2015-07-06 NOTE — Progress Notes (Signed)
Patient ID: Maria Gutierrez, female   DOB: Feb 26, 1941, 75 y.o.   MRN: [id number]  Chief Complaint  Patient presents with  . Follow-up    mammogram     HPI Maria Gutierrez is a 75 y.o. female who presents for a breast evaluation. The most recent mammogram was done on 06/29/15. Patient does perform regular self breast checks and gets regular mammograms done. She is tolerating the Tamoxifen. She has two more Herceptin treatments left.   The patient did not tolerate a trial of an aromatase inhibitor.  She is still reporting fatigue with physical activity. On careful questioning in the presence of her husband this is more a general weakness rather than shortness of breath.  I personally reviewed the patient's history.    HPI  Past Medical History  Diagnosis Date  . Hyperlipidemia   . Hypertension   . GERD (gastroesophageal reflux disease)   . Arthritis   . Heart murmur   . Anxiety   . Breast cancer of upper-outer quadrant of right female breast ([facility]) 06/10/2014    T1b,Nx; ER 90%, PR 50-90%, HER-2/neu 2+, FISH positive.  . SOB (shortness of breath) 12/03/2014    Past Surgical History  Procedure Laterality Date  . Abdominal hysterectomy    . [place]ne[place] surgery Left   . Cholecystectomy  2012  . Colonoscopy  2009  . Axillary lymph node biopsy Right 07/14/2014    Procedure: AXILLARY LYMPH NODE BIOPSY;  Surgeon: Robert Bellow, MD;  Location: [facility] ORS;  Service: General;  Laterality: Right;  . 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  . Eye surgery Bilateral 2014  . Joint replacement Left 2001  . Joint replacement Right 2003  . Joint replacement Left 2004    replacement joint broken  . Portacath placement Left 08/27/2014    Procedure: INSERTION PORT-A-CATH;  Surgeon: Robert Bellow, MD;  Location: [facility] ORS;  Service: General;  Laterality: Left;  . Breast biopsy Left 2015  . Breast  excisional biopsy Right 06/10/2014    DCIS rad and chemo    No family history on file.  Social History Social History  Substance Use Topics  . Smoking status: Never Smoker   . Smokeless tobacco: Never Used  . Alcohol Use: No    Allergies  Allergen Reactions  . Tape Other (See Comments)    Blisters on skin from tape during recent surgery.    Current Outpatient Prescriptions  Medication Sig Dispense Refill  . aspirin 81 MG tablet Take 81 mg by mouth daily.    . calcium carbonate (OS-CAL) 600 MG TABS tablet Take 600 mg by mouth daily.    . cholecalciferol (VITAMIN D) 1000 UNITS tablet Take 1,000 Units by mouth daily.    . fluticasone (FLONASE) 50 MCG/ACT nasal spray Place 1 spray into both nostrils daily. 16 g 2  . gentamicin ointment (GARAMYCIN) 0.1 %     . HYDROcodone-acetaminophen (NORCO) 5-325 MG tablet Take 1 tablet by mouth every 6 (six) hours as needed for moderate pain. 30 tablet 0  . lansoprazole (PREVACID) 15 MG capsule Take 15 mg by mouth every morning.     . lidocaine-prilocaine (EMLA) cream Apply 1 application topically as needed. 30 g 3  . meloxicam (MOBIC) 15 MG tablet Take 1 tablet (15 mg total) by mouth daily as needed for pain. 30 tablet 3  . metoprolol succinate (TOPROL-XL) 50 MG  24 hr tablet Take 50 mg by mouth every morning. Take with or immediately following a meal.    . Multiple Vitamins-Minerals (MULTIVITAMIN) tablet Take 1 tablet by mouth daily.    . mupirocin ointment (BACTROBAN) 2 %     . PARoxetine (PAXIL) 40 MG tablet Take 40 mg by mouth at bedtime.     . simvastatin (ZOCOR) 40 MG tablet Take 40 mg by mouth at bedtime.     . tamoxifen (NOLVADEX) 20 MG tablet Take 1 tablet (20 mg total) by mouth daily. 30 tablet 3  . traMADol (ULTRAM) 50 MG tablet Take by mouth every 6 (six) hours as needed.    . hydrochlorothiazide (HYDRODIURIL) 25 MG tablet      No current facility-administered medications for this visit.   Facility-Administered Medications Ordered  in Other Visits  Medication Dose Route Frequency Provider Last Rate Last Dose  . sodium chloride 0.9 % injection 10 mL  10 mL Intravenous PRN Forest Gleason, MD   10 mL at 01/27/15 1416    Review of Systems Review of Systems  Constitutional: Negative.   Respiratory: Negative.   Cardiovascular: Negative.     Blood pressure 132/76, pulse 76, resp. rate 12, height '5\' 3"'  (1.6 m), weight 280 lb (127.007 kg).  Physical Exam Physical Exam  Constitutional: She is oriented to person, place, and time. She appears well-developed and well-nourished.  Eyes: Conjunctivae are normal. No scleral icterus.  Neck: Neck supple.  Cardiovascular: Normal rate, regular rhythm and normal heart sounds.   Pulmonary/Chest: Effort normal and breath sounds normal. Right breast exhibits no inverted nipple, no mass, no nipple discharge, no skin change and no tenderness. Left breast exhibits no inverted nipple, no mass, no nipple discharge, no skin change and no tenderness.  Lymphadenopathy:    She has no cervical adenopathy.    She has no axillary adenopathy.  Neurological: She is alert and oriented to person, place, and time.  Skin: Skin is warm and dry.  Psychiatric: She has a normal mood and affect.    Data Reviewed Lateral mammograms dated 06/29/2015 showed postsurgical changes. BI-RADS-2.  Assessment    Doing well status post breast conservation.    Plan    The patient has been encouraged to exercise as tolerated. Hopefully her decreased energy will improve when she completes her Herceptin therapy.  We'll plan for follow-up examination with bilateral diagnostic mammograms in one year.    PCP: [name] This has been scribed by Lesly Rubenstein LPN    Robert Bellow 07/07/2015, 4:02 PM

## 2015-07-06 NOTE — Patient Instructions (Signed)
Continue self breast exams. Call office for any new breast issues or concerns. Follow up in one year.   

## 2015-07-14 ENCOUNTER — Telehealth: Payer: Self-pay | Admitting: Pharmacist

## 2015-07-14 ENCOUNTER — Inpatient Hospital Stay: Payer: Medicare HMO | Attending: Oncology

## 2015-07-14 DIAGNOSIS — Z5112 Encounter for antineoplastic immunotherapy: Secondary | ICD-10-CM | POA: Diagnosis not present

## 2015-07-14 DIAGNOSIS — Z79810 Long term (current) use of selective estrogen receptor modulators (SERMs): Secondary | ICD-10-CM | POA: Diagnosis not present

## 2015-07-14 DIAGNOSIS — Z170 Estrogen receptor positive status [ER+]: Secondary | ICD-10-CM | POA: Diagnosis not present

## 2015-07-14 DIAGNOSIS — C50911 Malignant neoplasm of unspecified site of right female breast: Secondary | ICD-10-CM | POA: Insufficient documentation

## 2015-07-14 MED ORDER — SODIUM CHLORIDE 0.9 % IV SOLN
Freq: Once | INTRAVENOUS | Status: AC
  Administered 2015-07-14: 14:00:00 via INTRAVENOUS
  Filled 2015-07-14: qty 1000

## 2015-07-14 MED ORDER — HEPARIN SOD (PORK) LOCK FLUSH 100 UNIT/ML IV SOLN
500.0000 [IU] | Freq: Once | INTRAVENOUS | Status: AC | PRN
  Administered 2015-07-14: 500 [IU]
  Filled 2015-07-14: qty 5

## 2015-07-14 MED ORDER — TRASTUZUMAB CHEMO INJECTION 440 MG
6.0000 mg/kg | Freq: Once | INTRAVENOUS | Status: AC
  Administered 2015-07-14: 798 mg via INTRAVENOUS
  Filled 2015-07-14: qty 38

## 2015-07-14 MED ORDER — ACETAMINOPHEN 325 MG PO TABS
650.0000 mg | ORAL_TABLET | Freq: Once | ORAL | Status: AC
  Administered 2015-07-14: 650 mg via ORAL
  Filled 2015-07-14: qty 2

## 2015-07-15 ENCOUNTER — Other Ambulatory Visit: Payer: Self-pay | Admitting: *Deleted

## 2015-07-15 DIAGNOSIS — C50911 Malignant neoplasm of unspecified site of right female breast: Secondary | ICD-10-CM

## 2015-07-30 ENCOUNTER — Ambulatory Visit
Admission: RE | Admit: 2015-07-30 | Discharge: 2015-07-30 | Disposition: A | Discharge status: Home / Self Care | Payer: Medicare HMO | Source: Ambulatory Visit | Attending: Oncology | Admitting: Oncology

## 2015-07-30 DIAGNOSIS — C50911 Malignant neoplasm of unspecified site of right female breast: Secondary | ICD-10-CM | POA: Insufficient documentation

## 2015-07-30 MED ORDER — TECHNETIUM TC 99M-LABELED RED BLOOD CELLS IV KIT
20.0000 | PACK | Freq: Once | INTRAVENOUS | Status: AC | PRN
  Administered 2015-07-30: 22.72 via INTRAVENOUS

## 2015-08-05 ENCOUNTER — Inpatient Hospital Stay: Payer: Medicare HMO

## 2015-08-05 ENCOUNTER — Inpatient Hospital Stay: Payer: Medicare HMO | Attending: Family Medicine

## 2015-08-05 ENCOUNTER — Inpatient Hospital Stay (HOSPITAL_BASED_OUTPATIENT_CLINIC_OR_DEPARTMENT_OTHER): Payer: Medicare HMO | Admitting: Family Medicine

## 2015-08-05 VITALS — BP 122/84 | HR 65 | Temp 98.6°F | Resp 18 | Wt 289.2 lb

## 2015-08-05 DIAGNOSIS — C50919 Malignant neoplasm of unspecified site of unspecified female breast: Secondary | ICD-10-CM

## 2015-08-05 DIAGNOSIS — Z5112 Encounter for antineoplastic immunotherapy: Secondary | ICD-10-CM | POA: Diagnosis present

## 2015-08-05 DIAGNOSIS — K219 Gastro-esophageal reflux disease without esophagitis: Secondary | ICD-10-CM | POA: Diagnosis not present

## 2015-08-05 DIAGNOSIS — E785 Hyperlipidemia, unspecified: Secondary | ICD-10-CM | POA: Insufficient documentation

## 2015-08-05 DIAGNOSIS — Z9221 Personal history of antineoplastic chemotherapy: Secondary | ICD-10-CM | POA: Diagnosis not present

## 2015-08-05 DIAGNOSIS — Z170 Estrogen receptor positive status [ER+]: Secondary | ICD-10-CM | POA: Diagnosis not present

## 2015-08-05 DIAGNOSIS — Z79899 Other long term (current) drug therapy: Secondary | ICD-10-CM | POA: Insufficient documentation

## 2015-08-05 DIAGNOSIS — M1990 Unspecified osteoarthritis, unspecified site: Secondary | ICD-10-CM | POA: Insufficient documentation

## 2015-08-05 DIAGNOSIS — Z923 Personal history of irradiation: Secondary | ICD-10-CM

## 2015-08-05 DIAGNOSIS — C50411 Malignant neoplasm of upper-outer quadrant of right female breast: Secondary | ICD-10-CM | POA: Insufficient documentation

## 2015-08-05 DIAGNOSIS — F419 Anxiety disorder, unspecified: Secondary | ICD-10-CM | POA: Insufficient documentation

## 2015-08-05 DIAGNOSIS — I10 Essential (primary) hypertension: Secondary | ICD-10-CM | POA: Insufficient documentation

## 2015-08-05 DIAGNOSIS — C50911 Malignant neoplasm of unspecified site of right female breast: Secondary | ICD-10-CM

## 2015-08-05 LAB — CBC WITH DIFFERENTIAL/PLATELET
Basophils Absolute: 0.1 10*3/uL (ref 0–0.1)
Basophils Relative: 1 %
EOS PCT: 2 %
Eosinophils Absolute: 0.1 10*3/uL (ref 0–0.7)
HCT: 39.1 % (ref 35.0–47.0)
Hemoglobin: 13.6 g/dL (ref 12.0–16.0)
LYMPHS ABS: 2.4 10*3/uL (ref 1.0–3.6)
LYMPHS PCT: 32 %
MCH: 32.2 pg (ref 26.0–34.0)
MCHC: 34.8 g/dL (ref 32.0–36.0)
MCV: 92.7 fL (ref 80.0–100.0)
MONO ABS: 0.8 10*3/uL (ref 0.2–0.9)
MONOS PCT: 11 %
Neutro Abs: 4.1 10*3/uL (ref 1.4–6.5)
Neutrophils Relative %: 54 %
PLATELETS: 175 10*3/uL (ref 150–440)
RBC: 4.22 MIL/uL (ref 3.80–5.20)
RDW: 13.2 % (ref 11.5–14.5)
WBC: 7.4 10*3/uL (ref 3.6–11.0)

## 2015-08-05 LAB — COMPREHENSIVE METABOLIC PANEL
ALT: 18 U/L (ref 14–54)
AST: 27 U/L (ref 15–41)
Albumin: 3.8 g/dL (ref 3.5–5.0)
Alkaline Phosphatase: 53 U/L (ref 38–126)
Anion gap: 8 (ref 5–15)
BUN: 21 mg/dL — ABNORMAL HIGH (ref 6–20)
CHLORIDE: 104 mmol/L (ref 101–111)
CO2: 26 mmol/L (ref 22–32)
CREATININE: 1.17 mg/dL — AB (ref 0.44–1.00)
Calcium: 8.9 mg/dL (ref 8.9–10.3)
GFR, EST AFRICAN AMERICAN: 52 mL/min — AB (ref >60)
GFR, EST NON AFRICAN AMERICAN: 45 mL/min — AB (ref >60)
Glucose, Bld: 109 mg/dL — ABNORMAL HIGH (ref 65–99)
POTASSIUM: 3.7 mmol/L (ref 3.5–5.1)
Sodium: 138 mmol/L (ref 135–145)
Total Bilirubin: 1 mg/dL (ref 0.3–1.2)
Total Protein: 6.6 g/dL (ref 6.5–8.1)

## 2015-08-05 MED ORDER — SODIUM CHLORIDE 0.9 % IV SOLN
Freq: Once | INTRAVENOUS | Status: AC
  Administered 2015-08-05: 15:00:00 via INTRAVENOUS
  Filled 2015-08-05: qty 1000

## 2015-08-05 MED ORDER — TAMOXIFEN CITRATE 20 MG PO TABS: 20.0000 mg | ORAL_TABLET | Freq: Every day | ORAL | Status: DC

## 2015-08-05 MED ORDER — HEPARIN SOD (PORK) LOCK FLUSH 100 UNIT/ML IV SOLN
500.0000 [IU] | Freq: Once | INTRAVENOUS | Status: AC
  Administered 2015-08-05: 500 [IU] via INTRAVENOUS
  Filled 2015-08-05: qty 5

## 2015-08-05 MED ORDER — TRASTUZUMAB CHEMO INJECTION 440 MG
6.0000 mg/kg | Freq: Once | INTRAVENOUS | Status: AC
  Administered 2015-08-05: 798 mg via INTRAVENOUS
  Filled 2015-08-05: qty 38

## 2015-08-05 MED ORDER — ACETAMINOPHEN 325 MG PO TABS
650.0000 mg | ORAL_TABLET | Freq: Once | ORAL | Status: AC
  Administered 2015-08-05: 650 mg via ORAL
  Filled 2015-08-05: qty 2

## 2015-08-05 MED ORDER — SODIUM CHLORIDE 0.9% FLUSH
10.0000 mL | INTRAVENOUS | Status: DC | PRN
  Filled 2015-08-05: qty 10

## 2015-08-05 NOTE — Progress Notes (Signed)
Maria Gutierrez @ [facility] [phone]  Fax:([phone]) [place]: [birth year]  MR#: [id number]  XBD#:[id number]  Patient Care Team: [name], MD as PCP - General (Family Medicine) [name], MD as Referring Physician (Family Medicine) Robert Bellow, MD (General Surgery)  CHIEF COMPLAINT:  Chief Complaint  Patient presents with  . Breast Cancer   Oncology History   1.  Carcinoma of right breast.  Status post lumpectomy and sentinel lymph node evaluation.  Tumor size 1 cm.  Estrogen and progesterone receptor positive.  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  Diagnosis in April of 2016 2.  Accelerated partial breast radiation in June of 2016 3.  Weekly Taxol and Herceptin therapy for adjuvant treatment starting from July of 2016 4.Taxol was discontinue in September of 2016 because of side effect of neuropathy Patient was started on maintenance Herceptin therapy from October of 2016     VISIT DIAGNOSIS:     ICD-9-CM ICD-10-CM   1. Invasive ductal carcinoma of breast, stage 1, unspecified laterality (HCC) 174.9 C50.919 CBC with Differential/Platelet     Comprehensive metabolic panel     tamoxifen (NOLVADEX) 20 MG tablet      Oncology Flowsheet 03/10/2015 03/31/2015 04/21/2015 05/12/2015 06/02/2015 06/23/2015 07/14/2015  Day, Cycle - - - - - - -  dexamethasone (DECADRON) IV - - - - - - -  ondansetron (ZOFRAN) IJ - - - - - - -  ondansetron (ZOFRAN) IV - - - - - - -  PACLitaxel (TAXOL) IV - - - - - - -  trastuzumab (HERCEPTIN) IV 6 mg/kg 6 mg/kg 6 mg/kg 6 mg/kg 6 mg/kg 6 mg/kg 6 mg/kg    INTERVAL HISTORY:  Patient is here for ongoing evaluation and for continuation of Herceptin therapy, Today is her final treatment.Maria Gutierrez MUGA scan of the heart from 07/30/2015 has been reviewed and shows 67% ejection fraction which is improved from previous report. Patient is currently on tamoxifen due to intolerance of aromatase inhibitors. She reports that she is feeling  very well with tamoxifen and denies any new joint, bone, or muscle pains. Patient's mammogram is up-to-date and last reported 06/29/2015 as BI-RADS 2, benign.  REVIEW OF SYSTEMS:   General status: Alert and oriented and not any acute distress.  No change in performance status.  No chills.  No fever. HEENT: Continues to have congested nose and congested sinuses.  No fever.  Occasional epistaxis. Lungs: No cough or shortness of breath.   Cardiac: No chest pain or paroxysmal nocturnal dyspnea GI: No nausea no vomiting no diarrhea no abdominal pain Skin: No rash Lower extremity: no swelling Neurological system: No tingling.  No numbness.  No other focal signs Musculoskeletal system: no bony pains   PAST MEDICAL HISTORY: Past Medical History  Diagnosis Date  . Hyperlipidemia   . Hypertension   . GERD (gastroesophageal reflux disease)   . Arthritis   . Heart murmur   . Anxiety   . Breast cancer of upper-outer quadrant of right female breast ([place]) 06/10/2014    T1b,Nx; ER 90%, PR 50-90%, HER-2/neu 2+, FISH positive.  . SOB (shortness of breath) 12/03/2014    PAST SURGICAL HISTORY: Past Surgical History  Procedure Laterality Date  . Abdominal hysterectomy    . [place] surgery Left   . Cholecystectomy  2012  . Colonoscopy  2009  . Axillary lymph node biopsy Right 07/14/2014    Procedure: AXILLARY LYMPH NODE BIOPSY;  Surgeon: Robert Bellow, MD;  Location: [facility]  ORS;  Service: General;  Laterality: Right;  . 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  . Eye surgery Bilateral 2014  . Joint replacement Left 2001  . Joint replacement Right 2003  . Joint replacement Left 2004    replacement joint broken  . Portacath placement Left 08/27/2014    Procedure: INSERTION PORT-A-CATH;  Surgeon: Robert Bellow, MD;  Location: [facility] ORS;  Service: General;  Laterality: Left;  . Breast biopsy  Left 2015  . Breast excisional biopsy Right 06/10/2014    DCIS rad and chemo    FAMILY HISTORY There is no significant family history of breast cancer, ovarian cancer, colon cancer     ADVANCED DIRECTIVES: Patient does not have any advanced healthcare directive. Information has been given.   HEALTH MAINTENANCE: Social History  Substance Use Topics  . Smoking status: Never Smoker   . Smokeless tobacco: Never Used  . Alcohol Use: No      Allergies  Allergen Reactions  . Tape Other (See Comments)    Blisters on skin from tape during recent surgery.    Current Outpatient Prescriptions  Medication Sig Dispense Refill  . aspirin 81 MG tablet Take 81 mg by mouth daily.    . calcium carbonate (OS-CAL) 600 MG TABS tablet Take 600 mg by mouth daily.    . cholecalciferol (VITAMIN D) 1000 UNITS tablet Take 1,000 Units by mouth daily.    . fluticasone (FLONASE) 50 MCG/ACT nasal spray Place 1 spray into both nostrils daily. 16 g 2  . gentamicin ointment (GARAMYCIN) 0.1 %     . hydrochlorothiazide (HYDRODIURIL) 25 MG tablet     . HYDROcodone-acetaminophen (NORCO) 5-325 MG tablet Take 1 tablet by mouth every 6 (six) hours as needed for moderate pain. 30 tablet 0  . lansoprazole (PREVACID) 15 MG capsule Take 15 mg by mouth every morning.     . lidocaine-prilocaine (EMLA) cream Apply 1 application topically as needed. 30 g 3  . meloxicam (MOBIC) 15 MG tablet Take 1 tablet (15 mg total) by mouth daily as needed for pain. 30 tablet 3  . metoprolol succinate (TOPROL-XL) 50 MG 24 hr tablet Take 50 mg by mouth every morning. Take with or immediately following a meal.    . Multiple Vitamins-Minerals (MULTIVITAMIN) tablet Take 1 tablet by mouth daily.    . mupirocin ointment (BACTROBAN) 2 %     . PARoxetine (PAXIL) 40 MG tablet Take 40 mg by mouth at bedtime.     . simvastatin (ZOCOR) 40 MG tablet Take 40 mg by mouth at bedtime.     . tamoxifen (NOLVADEX) 20 MG tablet Take 1 tablet (20 mg total)  by mouth daily. 90 tablet 2  . traMADol (ULTRAM) 50 MG tablet Take by mouth every 6 (six) hours as needed.     No current facility-administered medications for this visit.   Facility-Administered Medications Ordered in Other Visits  Medication Dose Route Frequency Provider Last Rate Last Dose  . 0.9 %  sodium chloride infusion   Intravenous Once Forest Gleason, MD      . acetaminophen (TYLENOL) tablet 650 mg  650 mg Oral Once Forest Gleason, MD      . heparin lock flush 100 unit/mL  500 Units Intravenous Once Forest Gleason, MD      . sodium chloride 0.9 % injection 10 mL  10 mL Intravenous PRN Forest Gleason, MD  10 mL at 01/27/15 1416  . sodium chloride flush (NS) 0.9 % injection 10 mL  10 mL Intravenous PRN Forest Gleason, MD      . trastuzumab (HERCEPTIN) 798 mg in sodium chloride 0.9 % 250 mL chemo infusion  6 mg/kg (Treatment Plan Actual) Intravenous Once Forest Gleason, MD        OBJECTIVE: PHYSICAL EXAM: General  status: Performance status is good.  Patient has not lost significant weight Lungs: Air  entry equal on both sides.  No rhonchi.  No rales.   Cardiac: Heart sounds are normal.  No pericardial rub.  No murmur. Lymphatic system: Cervical, axillary, inguinal, lymph nodes not palpable GI: Abdomen is soft.  No ascites.  Liver spleen not palpable.  No tenderness.  Bowel sounds are within normal limit Lower extremity: Trace edema in bilateral lower extremities. Neurological system: Higher functions, cranial nerves intact no evidence of peripheral neuropathy. Skin: No rash.  No ecchymosis.Danley Danker Vitals:   08/05/15 1345  BP: 122/84  Pulse: 65  Temp: 98.6 F (37 C)  Resp: 18     Body mass index is 51.25 kg/(m^2).    ECOG FS:0 - Asymptomatic  LAB RESULTS:  Infusion on 08/05/2015  Component Date Value Ref Range Status  . WBC 08/05/2015 7.4  3.6 - 11.0 K/uL Final  . RBC 08/05/2015 4.22  3.80 - 5.20 MIL/uL Final  . Hemoglobin 08/05/2015 13.6  12.0 - 16.0 g/dL Final  . HCT  08/05/2015 39.1  35.0 - 47.0 % Final  . MCV 08/05/2015 92.7  80.0 - 100.0 fL Final  . MCH 08/05/2015 32.2  26.0 - 34.0 pg Final  . MCHC 08/05/2015 34.8  32.0 - 36.0 g/dL Final  . RDW 08/05/2015 13.2  11.5 - 14.5 % Final  . Platelets 08/05/2015 175  150 - 440 K/uL Final  . Neutrophils Relative % 08/05/2015 54   Final  . Neutro Abs 08/05/2015 4.1  1.4 - 6.5 K/uL Final  . Lymphocytes Relative 08/05/2015 32   Final  . Lymphs Abs 08/05/2015 2.4  1.0 - 3.6 K/uL Final  . Monocytes Relative 08/05/2015 11   Final  . Monocytes Absolute 08/05/2015 0.8  0.2 - 0.9 K/uL Final  . Eosinophils Relative 08/05/2015 2   Final  . Eosinophils Absolute 08/05/2015 0.1  0 - 0.7 K/uL Final  . Basophils Relative 08/05/2015 1   Final  . Basophils Absolute 08/05/2015 0.1  0 - 0.1 K/uL Final  . Sodium 08/05/2015 138  135 - 145 mmol/L Final  . Potassium 08/05/2015 3.7  3.5 - 5.1 mmol/L Final  . Chloride 08/05/2015 104  101 - 111 mmol/L Final  . CO2 08/05/2015 26  22 - 32 mmol/L Final  . Glucose, Bld 08/05/2015 109* 65 - 99 mg/dL Final  . BUN 08/05/2015 21* 6 - 20 mg/dL Final  . Creatinine, Ser 08/05/2015 1.17* 0.44 - 1.00 mg/dL Final  . Calcium 08/05/2015 8.9  8.9 - 10.3 mg/dL Final  . Total Protein 08/05/2015 6.6  6.5 - 8.1 g/dL Final  . Albumin 08/05/2015 3.8  3.5 - 5.0 g/dL Final  . AST 08/05/2015 27  15 - 41 U/L Final  . ALT 08/05/2015 18  14 - 54 U/L Final  . Alkaline Phosphatase 08/05/2015 53  38 - 126 U/L Final  . Total Bilirubin 08/05/2015 1.0  0.3 - 1.2 mg/dL Final  . GFR calc non Af Amer 08/05/2015 45* >60 mL/min Final  . GFR calc Af Amer 08/05/2015 52* >60  mL/min Final   Comment: (NOTE) The eGFR has been calculated using the CKD EPI equation. This calculation has not been validated in all clinical situations. eGFR's persistently <60 mL/min signify possible Chronic Kidney Disease.   . Anion gap 08/05/2015 8  5 - 15 Final   IMPRESSION: Normal LEFT ventricular ejection fraction of 58%.  Normal  LEFT ventricular wall motion.  When compared to the previous exam, current calculi LEFT ventricular ejection fraction has mildly decreased from the 65% on the prior study.   Electronically Signed  By: Lavonia Dana M.D.  On: 12/24/2014 11:45   ASSESSMENT/ PLAN:  1. History of carcinoma of breast, stage IB. Sentinel lymph nodes negative, Estrogen receptor progesterone receptor positive HER-2/neu receptor positive. Most recent MUGA scan 07/30/2015 with 67% ejection fraction. We'll proceed with her final Herceptin treatment today. Patient has been instructed to continue with her tamoxifen daily. She has had a hysterectomy in the past. Patient advised to return to our clinic in approximately 4 months for continued follow-up with Dr. Rogue Bussing.  Patient expressed understanding and was in agreement with this plan. She also understands that she can call clinic at any time with any questions, concerns, or complaints.  Dr. Oliva Bustard was available for consultation and review of plan of care for this patient.  Staging form: Breast, AJCC 7th Edition     Clinical: Stage IA (T1b, N0, M0) - Signed by Forest Gleason, MD on 07/28/2014   Evlyn Kanner, NP   08/05/2015 2:24 PM

## 2015-08-05 NOTE — Progress Notes (Signed)
Patient does not offer any problems today.  

## 2015-09-17 ENCOUNTER — Inpatient Hospital Stay: Payer: Medicare HMO

## 2015-09-17 ENCOUNTER — Inpatient Hospital Stay: Payer: Medicare HMO | Attending: Internal Medicine

## 2015-09-17 DIAGNOSIS — Z170 Estrogen receptor positive status [ER+]: Secondary | ICD-10-CM | POA: Insufficient documentation

## 2015-09-17 DIAGNOSIS — Z95828 Presence of other vascular implants and grafts: Secondary | ICD-10-CM

## 2015-09-17 DIAGNOSIS — C50911 Malignant neoplasm of unspecified site of right female breast: Secondary | ICD-10-CM | POA: Diagnosis present

## 2015-09-17 DIAGNOSIS — Z79810 Long term (current) use of selective estrogen receptor modulators (SERMs): Secondary | ICD-10-CM | POA: Diagnosis not present

## 2015-09-17 DIAGNOSIS — Z452 Encounter for adjustment and management of vascular access device: Secondary | ICD-10-CM | POA: Insufficient documentation

## 2015-09-17 MED ORDER — SODIUM CHLORIDE 0.9% FLUSH
10.0000 mL | INTRAVENOUS | Status: DC | PRN
  Administered 2015-09-17: 10 mL via INTRAVENOUS
  Filled 2015-09-17: qty 10

## 2015-09-17 MED ORDER — HEPARIN SOD (PORK) LOCK FLUSH 100 UNIT/ML IV SOLN
500.0000 [IU] | Freq: Once | INTRAVENOUS | Status: AC
  Administered 2015-09-17: 500 [IU] via INTRAVENOUS

## 2015-09-17 NOTE — Progress Notes (Signed)
Survivorship Care Plan visit completed.  Treatment summary reviewed and given to patient.  ASCO answers booklet reviewed and given to patient.  CARE program and Cancer Transitions discussed along with other resources the cancer center offers patients.  Patient verbalized understanding.

## 2015-09-21 NOTE — Progress Notes (Signed)
  Oncology Nurse Navigator Documentation  Navigator Location: [facility]Med Onc (09/21/15 1600) Navigator Encounter Type: Treatment (09/21/15 1600)           Patient Visit Type: MedOnc (09/21/15 1600) Treatment Phase: Treatment (09/21/15 1600) Barriers/Navigation Needs:  (anti-hormonal) (09/21/15 1600)                          Time Spent with Patient: 45 (09/21/15 1600)  Patient reports she is having  "bone pain" since taking Tamoxifen.  Questions whether okay to stop for a few days, to see if pain resolves.   Notified Dr. Rogue Bussing. Instructed patient to stop taking for 1 week, and she is to report back if symptoms improved.  She has also tried Femara with unfavorable side effects.

## 2015-10-07 ENCOUNTER — Ambulatory Visit
Admission: RE | Admit: 2015-10-07 | Discharge: 2015-10-07 | Disposition: A | Discharge status: Home / Self Care | Payer: Medicare HMO | Source: Ambulatory Visit | Attending: Radiation Oncology | Admitting: Radiation Oncology

## 2015-10-07 ENCOUNTER — Encounter: Payer: Self-pay | Admitting: Radiation Oncology

## 2015-10-07 VITALS — BP 128/81 | HR 62 | Temp 98.3°F | Resp 20 | Wt 292.8 lb

## 2015-10-07 DIAGNOSIS — Z79810 Long term (current) use of selective estrogen receptor modulators (SERMs): Secondary | ICD-10-CM | POA: Diagnosis not present

## 2015-10-07 DIAGNOSIS — Z170 Estrogen receptor positive status [ER+]: Secondary | ICD-10-CM | POA: Insufficient documentation

## 2015-10-07 DIAGNOSIS — C50911 Malignant neoplasm of unspecified site of right female breast: Secondary | ICD-10-CM | POA: Insufficient documentation

## 2015-10-07 DIAGNOSIS — C50411 Malignant neoplasm of upper-outer quadrant of right female breast: Secondary | ICD-10-CM

## 2015-10-07 DIAGNOSIS — Z923 Personal history of irradiation: Secondary | ICD-10-CM | POA: Diagnosis not present

## 2015-10-07 NOTE — Progress Notes (Signed)
Radiation Oncology Follow up Note  Name: Maria Gutierrez   Date:   10/07/2015 MRN:  [id number] DOB: 1940/08/08    This 75 y.o. female presents to the clinic today for 1 year follow-up status post accelerated partial breast irradiation to her right breast for a stage I (T1 N0 M0) ER/PR positive HER-2/neu positive invasive mammary carcinoma.[name]  REFERRING PROVIDER: Robert Bellow, MD  HPI: Patient is a 75 year old female now 1 year out having completed accelerated partial breast irradiation to her right breast for a 1 cm invasive mammary carcinoma ER/PR positive HER-2/neu positive she is completed all chemotherapy and Herceptin at this time. She is doing well.[name] She's been having some hip pain currently on tamoxifen other anti-estrogen have been tried. She had a mammogram back in May showing BI-RADS 2 benign with suggestive for 1 year follow-up. She specifically denies breast tenderness cough or bone pain.  COMPLICATIONS OF TREATMENT: none  FOLLOW UP COMPLIANCE: keeps appointments   PHYSICAL EXAM:  BP 128/81   Pulse 62   Temp 98.3 F (36.8 C)   Resp 20   Wt 292 lb 12.3 oz (132.8 kg)   BMI 51.86 kg/m  Lungs are clear to A&P cardiac examination essentially unremarkable with regular rate and rhythm. No dominant mass or nodularity is noted in either breast in 2 positions examined. Incision is well-healed. No axillary or supraclavicular adenopathy is appreciated. Cosmetic result is excellent. There is some still some nodularity present in her lumpectomy site consistent with high dose radiation changes and surgery. Well-developed well-nourished patient in NAD. HEENT reveals PERLA, EOMI, discs not visualized.  Oral cavity is clear. No oral mucosal lesions are identified. Neck is clear without evidence of cervical or supraclavicular adenopathy. Lungs are clear to A&P. Cardiac examination is essentially unremarkable with regular rate and rhythm without murmur rub or thrill. Abdomen is benign with no  organomegaly or masses noted. Motor sensory and DTR levels are equal and symmetric in the upper and lower extremities. Cranial nerves II through XII are grossly intact. Proprioception is intact. No peripheral adenopathy or edema is identified. No motor or sensory levels are noted. Crude visual fields are within normal range.  RADIOLOGY RESULTS: Most recent mammogram reviewed  PLAN: Present time she is doing well with no evidence of disease. I am please were overall progress. I've asked to see her back in 1 year for follow-up. She continues close follow-up care with surgeon. Patient knows to call with any concerns.  I would like to take this opportunity to thank you for allowing me to participate in the care of your patient.Armstead Peaks., MD

## 2015-10-28 ENCOUNTER — Inpatient Hospital Stay: Payer: Medicare HMO | Attending: Internal Medicine

## 2015-10-28 DIAGNOSIS — C50911 Malignant neoplasm of unspecified site of right female breast: Secondary | ICD-10-CM | POA: Insufficient documentation

## 2015-10-28 DIAGNOSIS — Z79810 Long term (current) use of selective estrogen receptor modulators (SERMs): Secondary | ICD-10-CM | POA: Diagnosis not present

## 2015-10-28 DIAGNOSIS — Z95828 Presence of other vascular implants and grafts: Secondary | ICD-10-CM

## 2015-10-28 DIAGNOSIS — Z452 Encounter for adjustment and management of vascular access device: Secondary | ICD-10-CM | POA: Diagnosis not present

## 2015-10-28 DIAGNOSIS — Z170 Estrogen receptor positive status [ER+]: Secondary | ICD-10-CM | POA: Insufficient documentation

## 2015-10-28 MED ORDER — HEPARIN SOD (PORK) LOCK FLUSH 100 UNIT/ML IV SOLN
500.0000 [IU] | Freq: Once | INTRAVENOUS | Status: AC
  Administered 2015-10-28: 500 [IU] via INTRAVENOUS

## 2015-10-28 MED ORDER — SODIUM CHLORIDE 0.9% FLUSH
10.0000 mL | INTRAVENOUS | Status: DC | PRN
  Administered 2015-10-28: 10 mL via INTRAVENOUS
  Filled 2015-10-28: qty 10

## 2015-11-27 IMAGING — NM NM CARDIA MUGA REST
4 series · 24 of 24 positions shown · non-contrast
Comparison: None.

CLINICAL DATA: Breast cancer. Evaluate cardiac function in relation
to chemotherapy.

EXAM:
NUCLEAR MEDICINE CARDIAC BLOOD POOL IMAGING (MUGA)
TECHNIQUE: Cardiac multi-gated acquisition was performed at rest following
intravenous injection of Mc-00m labeled red blood cells.
RADIOPHARMACEUTICALS:  21.4 mCi Wechnetium-ZZm in-vitro labeled red
blood cells IV

[Series 1000: ant-gated · 3.30mm/px · 6 of 24 frames shown]
[frame 3/24]
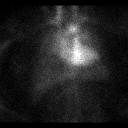
[frame 7/24]
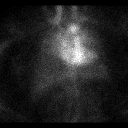
[frame 11/24]
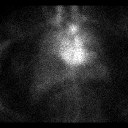
[frame 15/24]
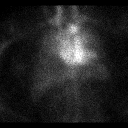
[frame 19/24]
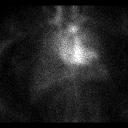
[frame 23/24]
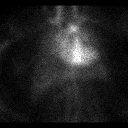

[Series 1000: lao 70-gated · 3.30mm/px · 6 of 24 frames shown]
[frame 3/24]
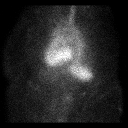
[frame 7/24]
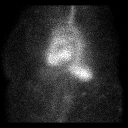
[frame 11/24]
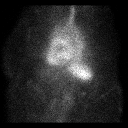
[frame 15/24]
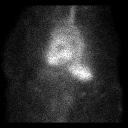
[frame 19/24]
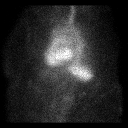
[frame 23/24]
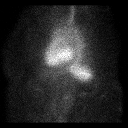

[Series 1000: lao 45-gated (results) · 3.30mm/px · 6 of 24 frames shown]
[frame 3/24  full-range]
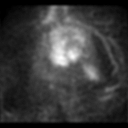
[frame 7/24  full-range]
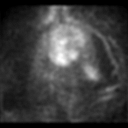
[frame 11/24  full-range]
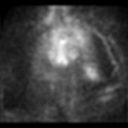
[frame 15/24  full-range]
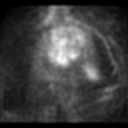
[frame 19/24  full-range]
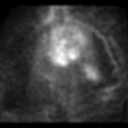
[frame 23/24  full-range]
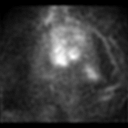

[Series 1000: lao 45-gated · 3.30mm/px · 6 of 24 frames shown]
[frame 3/24  full-range]
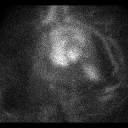
[frame 7/24  full-range]
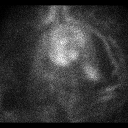
[frame 11/24  full-range]
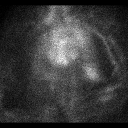
[frame 15/24  full-range]
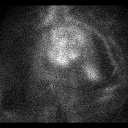
[frame 19/24  full-range]
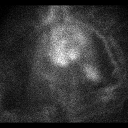
[frame 23/24  full-range]
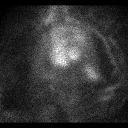

[24 of 24 positions shown; findings below may reference images not displayed]

FINDINGS: No focal wall motion abnormality of the left ventricle.

Calculated left ventricular ejection fraction equals 65%
IMPRESSION: Left ventricular ejection fraction equals 65 %.

## 2015-12-02 ENCOUNTER — Telehealth: Payer: Self-pay | Admitting: *Deleted

## 2015-12-02 NOTE — Telephone Encounter (Signed)
Patient informed of OK to get flu shot and wants to get it when she comes in next week

## 2015-12-02 NOTE — Telephone Encounter (Signed)
Asking if she can get Flu vaccine when she comes in for lab md on / or should she get it from her PCP?

## 2015-12-02 NOTE — Telephone Encounter (Signed)
As long as there are no contraindications for her to receive influenza vaccine-md ok with patient receiving injection.

## 2015-12-04 ENCOUNTER — Other Ambulatory Visit: Payer: Self-pay

## 2015-12-04 DIAGNOSIS — C50919 Malignant neoplasm of unspecified site of unspecified female breast: Secondary | ICD-10-CM

## 2015-12-07 ENCOUNTER — Ambulatory Visit: Payer: Medicare HMO | Admitting: Internal Medicine

## 2015-12-07 ENCOUNTER — Other Ambulatory Visit: Payer: Medicare HMO

## 2015-12-10 ENCOUNTER — Inpatient Hospital Stay: Payer: Medicare HMO | Attending: Internal Medicine

## 2015-12-10 ENCOUNTER — Inpatient Hospital Stay: Payer: Medicare HMO

## 2015-12-10 ENCOUNTER — Inpatient Hospital Stay (HOSPITAL_BASED_OUTPATIENT_CLINIC_OR_DEPARTMENT_OTHER): Payer: Medicare HMO | Admitting: Internal Medicine

## 2015-12-10 ENCOUNTER — Encounter (INDEPENDENT_AMBULATORY_CARE_PROVIDER_SITE_OTHER): Payer: Self-pay

## 2015-12-10 VITALS — BP 120/78 | HR 66 | Resp 22

## 2015-12-10 DIAGNOSIS — Z170 Estrogen receptor positive status [ER+]: Secondary | ICD-10-CM | POA: Insufficient documentation

## 2015-12-10 DIAGNOSIS — Z23 Encounter for immunization: Secondary | ICD-10-CM | POA: Insufficient documentation

## 2015-12-10 DIAGNOSIS — Z79810 Long term (current) use of selective estrogen receptor modulators (SERMs): Secondary | ICD-10-CM | POA: Insufficient documentation

## 2015-12-10 DIAGNOSIS — C50411 Malignant neoplasm of upper-outer quadrant of right female breast: Secondary | ICD-10-CM | POA: Diagnosis not present

## 2015-12-10 DIAGNOSIS — F419 Anxiety disorder, unspecified: Secondary | ICD-10-CM | POA: Diagnosis not present

## 2015-12-10 DIAGNOSIS — K219 Gastro-esophageal reflux disease without esophagitis: Secondary | ICD-10-CM | POA: Diagnosis not present

## 2015-12-10 DIAGNOSIS — C50811 Malignant neoplasm of overlapping sites of right female breast: Secondary | ICD-10-CM

## 2015-12-10 DIAGNOSIS — I10 Essential (primary) hypertension: Secondary | ICD-10-CM | POA: Diagnosis not present

## 2015-12-10 DIAGNOSIS — E785 Hyperlipidemia, unspecified: Secondary | ICD-10-CM | POA: Insufficient documentation

## 2015-12-10 DIAGNOSIS — M1990 Unspecified osteoarthritis, unspecified site: Secondary | ICD-10-CM | POA: Diagnosis not present

## 2015-12-10 DIAGNOSIS — N951 Menopausal and female climacteric states: Secondary | ICD-10-CM | POA: Insufficient documentation

## 2015-12-10 DIAGNOSIS — C50919 Malignant neoplasm of unspecified site of unspecified female breast: Secondary | ICD-10-CM

## 2015-12-10 DIAGNOSIS — Z95828 Presence of other vascular implants and grafts: Secondary | ICD-10-CM

## 2015-12-10 LAB — COMPREHENSIVE METABOLIC PANEL
ALBUMIN: 3.7 g/dL (ref 3.5–5.0)
ALK PHOS: 52 U/L (ref 38–126)
ALT: 22 U/L (ref 14–54)
ANION GAP: 10 (ref 5–15)
AST: 29 U/L (ref 15–41)
BILIRUBIN TOTAL: 0.9 mg/dL (ref 0.3–1.2)
BUN: 18 mg/dL (ref 6–20)
CO2: 26 mmol/L (ref 22–32)
Calcium: 9.1 mg/dL (ref 8.9–10.3)
Chloride: 104 mmol/L (ref 101–111)
Creatinine, Ser: 1.16 mg/dL — ABNORMAL HIGH (ref 0.44–1.00)
GFR calc Af Amer: 52 mL/min — ABNORMAL LOW (ref >60)
GFR calc non Af Amer: 45 mL/min — ABNORMAL LOW (ref >60)
GLUCOSE: 112 mg/dL — AB (ref 65–99)
Potassium: 3.5 mmol/L (ref 3.5–5.1)
Sodium: 140 mmol/L (ref 135–145)
TOTAL PROTEIN: 6.5 g/dL (ref 6.5–8.1)

## 2015-12-10 LAB — CBC WITH DIFFERENTIAL/PLATELET
BASOS PCT: 1 %
Basophils Absolute: 0.1 10*3/uL (ref 0–0.1)
Eosinophils Absolute: 0.1 10*3/uL (ref 0–0.7)
Eosinophils Relative: 2 %
HEMATOCRIT: 39.7 % (ref 35.0–47.0)
HEMOGLOBIN: 13.7 g/dL (ref 12.0–16.0)
LYMPHS ABS: 2.8 10*3/uL (ref 1.0–3.6)
LYMPHS PCT: 33 %
MCH: 32.3 pg (ref 26.0–34.0)
MCHC: 34.5 g/dL (ref 32.0–36.0)
MCV: 93.7 fL (ref 80.0–100.0)
MONOS PCT: 8 %
Monocytes Absolute: 0.7 10*3/uL (ref 0.2–0.9)
NEUTROS ABS: 4.7 10*3/uL (ref 1.4–6.5)
NEUTROS PCT: 56 %
Platelets: 169 10*3/uL (ref 150–440)
RBC: 4.24 MIL/uL (ref 3.80–5.20)
RDW: 13.4 % (ref 11.5–14.5)
WBC: 8.5 10*3/uL (ref 3.6–11.0)

## 2015-12-10 MED ORDER — ERGOCALCIFEROL 1.25 MG (50000 UT) PO CAPS: 50000.0000 [IU] | ORAL_CAPSULE | ORAL | 1 refills | Status: DC

## 2015-12-10 MED ORDER — INFLUENZA VAC SPLIT QUAD 0.5 ML IM SUSY
PREFILLED_SYRINGE | INTRAMUSCULAR | Status: AC
  Filled 2015-12-10: qty 0.5

## 2015-12-10 MED ORDER — INFLUENZA VAC SPLIT QUAD 0.5 ML IM SUSY
0.5000 mL | PREFILLED_SYRINGE | Freq: Once | INTRAMUSCULAR | Status: AC
  Administered 2015-12-10: 0.5 mL via INTRAMUSCULAR

## 2015-12-10 MED ORDER — VENLAFAXINE HCL ER 37.5 MG PO CP24: 37.5000 mg | ORAL_CAPSULE | Freq: Every day | ORAL | 3 refills | Status: DC

## 2015-12-10 MED ORDER — HEPARIN SOD (PORK) LOCK FLUSH 100 UNIT/ML IV SOLN
500.0000 [IU] | Freq: Once | INTRAVENOUS | Status: AC
  Administered 2015-12-10: 500 [IU] via INTRAVENOUS

## 2015-12-10 MED ORDER — SODIUM CHLORIDE 0.9% FLUSH
10.0000 mL | INTRAVENOUS | Status: AC | PRN
  Administered 2015-12-10: 10 mL via INTRAVENOUS
  Filled 2015-12-10: qty 10

## 2015-12-10 NOTE — Progress Notes (Signed)
[place] OFFICE PROGRESS NOTE  Patient Care Team: [name], MD as PCP - General (Family Medicine) [name], MD as Referring Physician (Family Medicine) Robert Bellow, MD (General Surgery)  Invasive ductal carcinoma of breast, stage 1 [facility])   Staging form: Breast, AJCC 7th Edition   - Clinical: Stage IA (T1b, N0, M0) - Signed by Forest Gleason, MD on 07/28/2014   Oncology History   1.  Carcinoma of right breast.  Status post lumpectomy and sentinel lymph node evaluation.  Tumor size 1 cm.  Estrogen and progesterone receptor positive.  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  Diagnosis in April of 2016 2.  Accelerated partial breast radiation in June of 2016 3.  Weekly Taxol and Herceptin therapy for adjuvant treatment starting from July of 2016; Finished herceptin July 2017   # 5.Patient has been taken off letrozole because of bony pain and started on tamoxifen 20 mg by mouth daily from May 12, 2015 Tamoxifen        Invasive ductal carcinoma of breast, stage 1 ([place])   06/17/2014 Initial Diagnosis    Invasive ductal carcinoma of breast, stage 1       Carcinoma of overlapping sites of right breast in female, estrogen receptor positive ([place])     This is my first interaction with the patient as patient's primary oncologist has been Dr.Choksi. I reviewed the patient's prior charts/pertinent labs/imaging in detail; findings are summarized above.     INTERVAL HISTORY:  Maria Gutierrez 75 y.o.  female pleasant patient above history of Breast cancer currently on antihormone therapy is here for follow-up.  Patient complains of significant hot flashes waking her from sleep. She also complains of significant joint aches and muscle pain. Denies any headaches. Denies any vision changes. No nausea no vomiting. No bone pain.  REVIEW OF SYSTEMS:  A complete 10 point review of system is done which is negative except mentioned above/history of present illness.   PAST MEDICAL  HISTORY :  Past Medical History:  Diagnosis Date  . Anxiety   . Arthritis   . Breast cancer of upper-outer quadrant of right female breast ([place]) 06/10/2014   T1b,Nx; ER 90%, PR 50-90%, HER-2/neu 2+, FISH positive.  [name] GERD (gastroesophageal reflux disease)   . Heart murmur   . Hyperlipidemia   . Hypertension   . SOB (shortness of breath) 12/03/2014    PAST SURGICAL HISTORY :   Past Surgical History:  Procedure Laterality Date  . ABDOMINAL HYSTERECTOMY    . AXILLARY LYMPH NODE BIOPSY Right 07/14/2014   Procedure: AXILLARY LYMPH NODE BIOPSY;  Surgeon: Robert Bellow, MD;  Location: [facility] ORS;  Service: General;  Laterality: Right;  . BREAST BIOPSY Left 2015  . BREAST EXCISIONAL BIOPSY Right 06/10/2014   DCIS rad and chemo  . BREAST SURGERY Right 06/10/14   Wide excision for intermediate grade DCIS, identification of a 10  millimeter area of invasive mammary carcinoma.  . CHOLECYSTECTOMY  2012  . COLONOSCOPY  2009  . EYE SURGERY Bilateral 2014  . JOINT REPLACEMENT Left 2001  . JOINT REPLACEMENT Right 2003  . JOINT REPLACEMENT Left 2004   replacement joint broken  . kneee surgery Left   . PORTACATH PLACEMENT Left 08/27/2014   Procedure: INSERTION PORT-A-CATH;  Surgeon: Robert Bellow, MD;  Location: [facility] ORS;  Service: General;  Laterality: Left;    FAMILY HISTORY :  No family history on file.  SOCIAL HISTORY:   Social History  Substance Use Topics  .  Smoking status: Never Smoker  . Smokeless tobacco: Never Used  . Alcohol use No    ALLERGIES:  is allergic to tape.  MEDICATIONS:  Current Outpatient Prescriptions  Medication Sig Dispense Refill  . calcium carbonate (OS-CAL) 600 MG TABS tablet Take 600 mg by mouth daily.    . cholecalciferol (VITAMIN D) 1000 UNITS tablet Take 1,000 Units by mouth daily.    . fluticasone (FLONASE) 50 MCG/ACT nasal spray Place 1 spray into both nostrils daily. 16 g 2  . hydrochlorothiazide (HYDRODIURIL) 25 MG tablet Take 25 mg by mouth  daily.     [name] HYDROcodone-acetaminophen (NORCO) 5-325 MG tablet Take 1 tablet by mouth every 6 (six) hours as needed for moderate pain. 30 tablet 0  . lansoprazole (PREVACID) 15 MG capsule Take 15 mg by mouth every morning.     . lidocaine-prilocaine (EMLA) cream Apply 1 application topically as needed. 30 g 3  . meloxicam (MOBIC) 15 MG tablet Take 1 tablet (15 mg total) by mouth daily as needed for pain. 30 tablet 3  . metoprolol succinate (TOPROL-XL) 50 MG 24 hr tablet Take 50 mg by mouth every morning. Take with or immediately following a meal.    . Multiple Vitamins-Minerals (MULTIVITAMIN) tablet Take 1 tablet by mouth daily.    . tamoxifen (NOLVADEX) 20 MG tablet Take 1 tablet (20 mg total) by mouth daily. 90 tablet 2  . ergocalciferol (VITAMIN D2) 50000 units capsule Take 1 capsule (50,000 Units total) by mouth once a week. 12 capsule 1  . simvastatin (ZOCOR) 40 MG tablet Take 40 mg by mouth at bedtime.     . traMADol (ULTRAM) 50 MG tablet Take by mouth every 6 (six) hours as needed.    . venlafaxine XR (EFFEXOR-XR) 37.5 MG 24 hr capsule Take 1 capsule (37.5 mg total) by mouth daily with breakfast. 30 capsule 3   No current facility-administered medications for this visit.    Facility-Administered Medications Ordered in Other Visits  Medication Dose Route Frequency Provider Last Rate Last Dose  . sodium chloride 0.9 % injection 10 mL  10 mL Intravenous PRN Forest Gleason, MD   10 mL at 01/27/15 1416  . sodium chloride flush (NS) 0.9 % injection 10 mL  10 mL Intravenous PRN Cammie Sickle, MD   10 mL at 12/10/15 1347    PHYSICAL EXAMINATION: ECOG PERFORMANCE STATUS: 0 - Asymptomatic  BP 120/78 (BP Location: Left Arm, Patient Position: Sitting) Comment (BP Location): forearm  Pulse 66   Resp (!) 22   There were no vitals filed for this visit.  GENERAL: Well-nourished well-developed; Alert, no distress and comfortable.   Accompanied by her husband.  EYES: no pallor or  icterus OROPHARYNX: no thrush or ulceration; good dentition  NECK: supple, no masses felt LYMPH:  no palpable lymphadenopathy in the cervical, axillary or inguinal regions LUNGS: clear to auscultation and  No wheeze or crackles HEART/CVS: regular rate & rhythm and no murmurs; No lower extremity edema ABDOMEN:abdomen soft, non-tender and normal bowel sounds Musculoskeletal:no cyanosis of digits and no clubbing  PSYCH: alert & oriented x 3 with fluent speech NEURO: no focal motor/sensory deficits SKIN:  no rashes or significant lesions  LABORATORY DATA:  I have reviewed the data as listed    Component Value Date/Time   NA 140 12/10/2015 1330   NA 141 06/05/2014 0843   K 3.5 12/10/2015 1330   K 3.5 06/05/2014 0843   CL 104 12/10/2015 1330   CL 105 06/05/2014  0843   CO2 26 12/10/2015 1330   CO2 27 06/05/2014 0843   GLUCOSE 112 (H) 12/10/2015 1330   GLUCOSE 94 06/05/2014 0843   BUN 18 12/10/2015 1330   BUN 22 (H) 06/05/2014 0843   CREATININE 1.16 (H) 12/10/2015 1330   CREATININE 0.97 06/05/2014 0843   CALCIUM 9.1 12/10/2015 1330   CALCIUM 9.2 06/05/2014 0843   PROT 6.5 12/10/2015 1330   ALBUMIN 3.7 12/10/2015 1330   AST 29 12/10/2015 1330   ALT 22 12/10/2015 1330   ALKPHOS 52 12/10/2015 1330   BILITOT 0.9 12/10/2015 1330   GFRNONAA 45 (L) 12/10/2015 1330   GFRNONAA 58 (L) 06/05/2014 0843   GFRAA 52 (L) 12/10/2015 1330   GFRAA >60 06/05/2014 0843    No results found for: SPEP, UPEP  Lab Results  Component Value Date   WBC 8.5 12/10/2015   NEUTROABS 4.7 12/10/2015   HGB 13.7 12/10/2015   HCT 39.7 12/10/2015   MCV 93.7 12/10/2015   PLT 169 12/10/2015      Chemistry      Component Value Date/Time   NA 140 12/10/2015 1330   NA 141 06/05/2014 0843   K 3.5 12/10/2015 1330   K 3.5 06/05/2014 0843   CL 104 12/10/2015 1330   CL 105 06/05/2014 0843   CO2 26 12/10/2015 1330   CO2 27 06/05/2014 0843   BUN 18 12/10/2015 1330   BUN 22 (H) 06/05/2014 0843   CREATININE  1.16 (H) 12/10/2015 1330   CREATININE 0.97 06/05/2014 0843      Component Value Date/Time   CALCIUM 9.1 12/10/2015 1330   CALCIUM 9.2 06/05/2014 0843   ALKPHOS 52 12/10/2015 1330   AST 29 12/10/2015 1330   ALT 22 12/10/2015 1330   BILITOT 0.9 12/10/2015 1330       RADIOGRAPHIC STUDIES: I have personally reviewed the radiological images as listed and agreed with the findings in the report. No results found.   ASSESSMENT & PLAN:  Carcinoma of overlapping sites of right breast in female, estrogen receptor positive ([place]) # Breast cancer ER/PR/her 2 Neu-POSITIVE; stage I; status post adjuvant radiation/MammoSite followed by axillary Herceptin adjuvant chemotherapy. Clinically no recurrence of recurrence. Discussed the excellent prognosis.   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 Recommend vitamin D high-dose 50,000 units weekly; Osteo Bi-Flex exercise. Hold tamoxifen for 6 weeks;    # hot flashes-grade 2-3. Recommend discontinuation of Paxil [interaction with tamoxifen]; start Effexor.    # port flushes- next visit/ 6 weeks; and then q 8 week.   #Follow-up with me in 6 weeks.    No orders of the defined types were placed in this encounter.  All questions were answered. The patient knows to call the clinic with any problems, questions or concerns.      Cammie Sickle, MD 12/13/2015 12:40 PM

## 2015-12-10 NOTE — Progress Notes (Signed)
Patient has been on Tamoxifen since May 12, 2015.  Patient reports hot flashes during the day. However, this has not caused interrupted sleep processes for her.  She reports joint pain in knees and back which has worsen since being on the Tamoxifen. Patient previously used letrozole in Nov. 2016 for several months. This was d/c due to severe joint pain. Pt reports that her joint pain now is about the "same as when I was on femara but has not gotten any worse."

## 2015-12-10 NOTE — Assessment & Plan Note (Addendum)
#   Breast cancer ER/PR/her 2 Neu-POSITIVE; stage I; status post adjuvant radiation/MammoSite followed by axillary Herceptin adjuvant chemotherapy. Clinically no recurrence of recurrence. Discussed the excellent prognosis.   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 Recommend vitamin D high-dose 50,000 units weekly; Osteo Bi-Flex exercise. Hold tamoxifen for 6 weeks;    # hot flashes-grade 2-3. Recommend discontinuation of Paxil [interaction with tamoxifen]; start Effexor.    # port flushes- next visit/ 6 weeks; and then q 8 week.   #Follow-up with me in 6 weeks.

## 2016-01-20 ENCOUNTER — Ambulatory Visit: Payer: Medicare HMO | Admitting: Internal Medicine

## 2016-01-27 ENCOUNTER — Inpatient Hospital Stay: Payer: Medicare HMO | Attending: Internal Medicine | Admitting: Internal Medicine

## 2016-01-27 ENCOUNTER — Inpatient Hospital Stay: Payer: Medicare HMO

## 2016-01-27 VITALS — BP 120/80 | HR 80 | Temp 98.0°F | Resp 18 | Wt 290.6 lb

## 2016-01-27 DIAGNOSIS — C50919 Malignant neoplasm of unspecified site of unspecified female breast: Secondary | ICD-10-CM

## 2016-01-27 DIAGNOSIS — N951 Menopausal and female climacteric states: Secondary | ICD-10-CM | POA: Insufficient documentation

## 2016-01-27 DIAGNOSIS — Z170 Estrogen receptor positive status [ER+]: Secondary | ICD-10-CM | POA: Diagnosis not present

## 2016-01-27 DIAGNOSIS — Z79899 Other long term (current) drug therapy: Secondary | ICD-10-CM | POA: Diagnosis not present

## 2016-01-27 DIAGNOSIS — I10 Essential (primary) hypertension: Secondary | ICD-10-CM | POA: Diagnosis not present

## 2016-01-27 DIAGNOSIS — Z79810 Long term (current) use of selective estrogen receptor modulators (SERMs): Secondary | ICD-10-CM | POA: Diagnosis not present

## 2016-01-27 DIAGNOSIS — Z923 Personal history of irradiation: Secondary | ICD-10-CM | POA: Diagnosis not present

## 2016-01-27 DIAGNOSIS — K219 Gastro-esophageal reflux disease without esophagitis: Secondary | ICD-10-CM | POA: Diagnosis not present

## 2016-01-27 DIAGNOSIS — F419 Anxiety disorder, unspecified: Secondary | ICD-10-CM | POA: Insufficient documentation

## 2016-01-27 DIAGNOSIS — C50411 Malignant neoplasm of upper-outer quadrant of right female breast: Secondary | ICD-10-CM

## 2016-01-27 DIAGNOSIS — M1990 Unspecified osteoarthritis, unspecified site: Secondary | ICD-10-CM | POA: Diagnosis not present

## 2016-01-27 DIAGNOSIS — Z95828 Presence of other vascular implants and grafts: Secondary | ICD-10-CM

## 2016-01-27 DIAGNOSIS — E785 Hyperlipidemia, unspecified: Secondary | ICD-10-CM | POA: Diagnosis not present

## 2016-01-27 DIAGNOSIS — Z452 Encounter for adjustment and management of vascular access device: Secondary | ICD-10-CM | POA: Diagnosis not present

## 2016-01-27 DIAGNOSIS — C50811 Malignant neoplasm of overlapping sites of right female breast: Secondary | ICD-10-CM

## 2016-01-27 MED ORDER — TAMOXIFEN CITRATE 20 MG PO TABS: 20.0000 mg | ORAL_TABLET | Freq: Every day | ORAL | 4 refills | Status: DC

## 2016-01-27 MED ORDER — HEPARIN SOD (PORK) LOCK FLUSH 100 UNIT/ML IV SOLN
500.0000 [IU] | Freq: Once | INTRAVENOUS | Status: AC
  Administered 2016-01-27: 500 [IU] via INTRAVENOUS

## 2016-01-27 MED ORDER — SODIUM CHLORIDE 0.9% FLUSH
10.0000 mL | INTRAVENOUS | Status: DC | PRN
  Administered 2016-01-27: 10 mL via INTRAVENOUS
  Filled 2016-01-27: qty 10

## 2016-01-27 NOTE — Progress Notes (Signed)
Pt here for follow up. Denies complaints at this time.

## 2016-01-27 NOTE — Assessment & Plan Note (Addendum)
#   Breast cancer ER/PR/her 2 Neu-POSITIVE; stage I; status post adjuvant radiation/MammoSite followed by axillary Herceptin adjuvant chemotherapy. Clinically no recurrence of recurrence. Discussed the excellent prognosis. Re-start tamoxifen.   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  vitamin D high-dose 50,000 units weekly; Osteo Bi-Flex exercise. Improved.   # hot flashes-grade Improved; on Effexor; improved.    # recommend port explantation. Pt agrees. Refer to Dr.Byrnett.   #Follow-up with me in 4 months/ labs.

## 2016-01-27 NOTE — Progress Notes (Signed)
[place] OFFICE PROGRESS NOTE  Patient Care Team: [name], MD as PCP - General (Family Medicine) [name], MD as Referring Physician (Family Medicine) Robert Bellow, MD (General Surgery)  Invasive ductal carcinoma of breast, stage 1 [facility])   Staging form: Breast, AJCC 7th Edition   - Clinical: Stage IA (T1b, N0, M0) - Signed by Forest Gleason, MD on 07/28/2014   Oncology History   1.  Carcinoma of right breast.  Status post lumpectomy and sentinel lymph node evaluation.  Tumor size 1 cm.  Estrogen and progesterone receptor positive.  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  Diagnosis in April of 2016 2.  Accelerated partial breast radiation in June of 2016 3.  Weekly Taxol and Herceptin therapy for adjuvant treatment starting from July of 2016; Finished herceptin July 2017   # 5.Patient has been taken off letrozole because of bony pain and started on tamoxifen 20 mg by mouth daily from May 12, 2015; Tamoxifen [Hx of TAH]      Invasive ductal carcinoma of breast, stage 1 ([place])   06/17/2014 Initial Diagnosis    Invasive ductal carcinoma of breast, stage 1       Carcinoma of overlapping sites of right breast in female, estrogen receptor positive ([place])       INTERVAL HISTORY:  NAJAT OLAZABAL 75 y.o.  female pleasant patient above history of Breast cancer currently on antihormone therapy is here for follow-up. Patient had been off tamoxifen for about 6 weeks.; She was also started on high-dose vitamin D/glucosamine-chondroitin.  She has noticed significant improvement of her arthritis/muscle pain. Hot flashes improved.  Denies any headaches. Denies any vision changes. No nausea no vomiting. No bone pain.  REVIEW OF SYSTEMS:  A complete 10 point review of system is done which is negative except mentioned above/history of present illness.   PAST MEDICAL HISTORY :  Past Medical History:  Diagnosis Date  . Anxiety   . Arthritis   . Breast cancer of upper-outer  quadrant of right female breast ([place]) 06/10/2014   T1b,Nx; ER 90%, PR 50-90%, HER-2/neu 2+, FISH positive.  [name] GERD (gastroesophageal reflux disease)   . Heart murmur   . Hyperlipidemia   . Hypertension   . SOB (shortness of breath) 12/03/2014    PAST SURGICAL HISTORY :   Past Surgical History:  Procedure Laterality Date  . ABDOMINAL HYSTERECTOMY    . AXILLARY LYMPH NODE BIOPSY Right 07/14/2014   Procedure: AXILLARY LYMPH NODE BIOPSY;  Surgeon: Robert Bellow, MD;  Location: [facility] ORS;  Service: General;  Laterality: Right;  . BREAST BIOPSY Left 2015  . BREAST EXCISIONAL BIOPSY Right 06/10/2014   DCIS rad and chemo  . BREAST SURGERY Right 06/10/14   Wide excision for intermediate grade DCIS, identification of a 10  millimeter area of invasive mammary carcinoma.  . CHOLECYSTECTOMY  2012  . COLONOSCOPY  2009  . EYE SURGERY Bilateral 2014  . JOINT REPLACEMENT Left 2001  . JOINT REPLACEMENT Right 2003  . JOINT REPLACEMENT Left 2004   replacement joint broken  . kneee surgery Left   . PORTACATH PLACEMENT Left 08/27/2014   Procedure: INSERTION PORT-A-CATH;  Surgeon: Robert Bellow, MD;  Location: [facility] ORS;  Service: General;  Laterality: Left;    FAMILY HISTORY :  No family history on file.  SOCIAL HISTORY:   Social History  Substance Use Topics  . Smoking status: Never Smoker  . Smokeless tobacco: Never Used  . Alcohol use No  ALLERGIES:  is allergic to tape.  MEDICATIONS:  Current Outpatient Prescriptions  Medication Sig Dispense Refill  . calcium carbonate (OS-CAL) 600 MG TABS tablet Take 600 mg by mouth daily.    . ergocalciferol (VITAMIN D2) 50000 units capsule Take 1 capsule (50,000 Units total) by mouth once a week. 12 capsule 1  . Glucosamine-Chondroit-Vit C-Mn (GLUCOSAMINE 1500 COMPLEX PO) Take by mouth.    . hydrochlorothiazide (HYDRODIURIL) 25 MG tablet Take 25 mg by mouth daily.     . lansoprazole (PREVACID) 15 MG capsule Take 15 mg by mouth every morning.      . lidocaine-prilocaine (EMLA) cream Apply 1 application topically as needed. 30 g 3  . meloxicam (MOBIC) 15 MG tablet Take 1 tablet (15 mg total) by mouth daily as needed for pain. 30 tablet 3  . metoprolol succinate (TOPROL-XL) 50 MG 24 hr tablet Take 50 mg by mouth every morning. Take with or immediately following a meal.    . Multiple Vitamins-Minerals (MULTIVITAMIN) tablet Take 1 tablet by mouth daily.    . simvastatin (ZOCOR) 40 MG tablet Take 40 mg by mouth at bedtime.     [name] venlafaxine XR (EFFEXOR-XR) 37.5 MG 24 hr capsule Take 1 capsule (37.5 mg total) by mouth daily with breakfast. 30 capsule 3  . fluticasone (FLONASE) 50 MCG/ACT nasal spray Place 1 spray into both nostrils daily. (Patient not taking: Reported on 01/27/2016) 16 g 2  . HYDROcodone-acetaminophen (NORCO) 5-325 MG tablet Take 1 tablet by mouth every 6 (six) hours as needed for moderate pain. (Patient not taking: Reported on 01/27/2016) 30 tablet 0  . tamoxifen (NOLVADEX) 20 MG tablet Take 1 tablet (20 mg total) by mouth daily. 90 tablet 4   No current facility-administered medications for this visit.    Facility-Administered Medications Ordered in Other Visits  Medication Dose Route Frequency Provider Last Rate Last Dose  . sodium chloride 0.9 % injection 10 mL  10 mL Intravenous PRN Forest Gleason, MD   10 mL at 01/27/15 1416  . sodium chloride flush (NS) 0.9 % injection 10 mL  10 mL Intravenous PRN Cammie Sickle, MD   10 mL at 12/10/15 1347  . sodium chloride flush (NS) 0.9 % injection 10 mL  10 mL Intravenous PRN Cammie Sickle, MD   10 mL at 01/27/16 1612    PHYSICAL EXAMINATION: ECOG PERFORMANCE STATUS: 0 - Asymptomatic  BP 120/80 (BP Location: Left Arm, Patient Position: Sitting)   Pulse 80   Temp 98 F (36.7 C)   Resp 18   Wt 290 lb 9.6 oz (131.8 kg)   BMI 51.48 kg/m   Filed Weights   01/27/16 1518  Weight: 290 lb 9.6 oz (131.8 kg)    GENERAL: Well-nourished well-developed; Alert, no  distress and comfortable.   Accompanied by her husband.  EYES: no pallor or icterus OROPHARYNX: no thrush or ulceration; good dentition  NECK: supple, no masses felt LYMPH:  no palpable lymphadenopathy in the cervical, axillary or inguinal regions LUNGS: clear to auscultation and  No wheeze or crackles HEART/CVS: regular rate & rhythm and no murmurs; No lower extremity edema ABDOMEN:abdomen soft, non-tender and normal bowel sounds Musculoskeletal:no cyanosis of digits and no clubbing  PSYCH: alert & oriented x 3 with fluent speech NEURO: no focal motor/sensory deficits SKIN:  no rashes or significant lesions  LABORATORY DATA:  I have reviewed the data as listed    Component Value Date/Time   NA 140 12/10/2015 1330   NA 141  06/05/2014 0843   K 3.5 12/10/2015 1330   K 3.5 06/05/2014 0843   CL 104 12/10/2015 1330   CL 105 06/05/2014 0843   CO2 26 12/10/2015 1330   CO2 27 06/05/2014 0843   GLUCOSE 112 (H) 12/10/2015 1330   GLUCOSE 94 06/05/2014 0843   BUN 18 12/10/2015 1330   BUN 22 (H) 06/05/2014 0843   CREATININE 1.16 (H) 12/10/2015 1330   CREATININE 0.97 06/05/2014 0843   CALCIUM 9.1 12/10/2015 1330   CALCIUM 9.2 06/05/2014 0843   PROT 6.5 12/10/2015 1330   ALBUMIN 3.7 12/10/2015 1330   AST 29 12/10/2015 1330   ALT 22 12/10/2015 1330   ALKPHOS 52 12/10/2015 1330   BILITOT 0.9 12/10/2015 1330   GFRNONAA 45 (L) 12/10/2015 1330   GFRNONAA 58 (L) 06/05/2014 0843   GFRAA 52 (L) 12/10/2015 1330   GFRAA >60 06/05/2014 0843    No results found for: SPEP, UPEP  Lab Results  Component Value Date   WBC 8.5 12/10/2015   NEUTROABS 4.7 12/10/2015   HGB 13.7 12/10/2015   HCT 39.7 12/10/2015   MCV 93.7 12/10/2015   PLT 169 12/10/2015      Chemistry      Component Value Date/Time   NA 140 12/10/2015 1330   NA 141 06/05/2014 0843   K 3.5 12/10/2015 1330   K 3.5 06/05/2014 0843   CL 104 12/10/2015 1330   CL 105 06/05/2014 0843   CO2 26 12/10/2015 1330   CO2 27  06/05/2014 0843   BUN 18 12/10/2015 1330   BUN 22 (H) 06/05/2014 0843   CREATININE 1.16 (H) 12/10/2015 1330   CREATININE 0.97 06/05/2014 0843      Component Value Date/Time   CALCIUM 9.1 12/10/2015 1330   CALCIUM 9.2 06/05/2014 0843   ALKPHOS 52 12/10/2015 1330   AST 29 12/10/2015 1330   ALT 22 12/10/2015 1330   BILITOT 0.9 12/10/2015 1330       RADIOGRAPHIC STUDIES: I have personally reviewed the radiological images as listed and agreed with the findings in the report. No results found.   ASSESSMENT & PLAN:  Carcinoma of overlapping sites of right breast in female, estrogen receptor positive ([place]) # Breast cancer ER/PR/her 2 Neu-POSITIVE; stage I; status post adjuvant radiation/MammoSite followed by axillary Herceptin adjuvant chemotherapy. Clinically no recurrence of recurrence. Discussed the excellent prognosis. Re-start tamoxifen.   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  vitamin D high-dose 50,000 units weekly; Osteo Bi-Flex exercise. Improved.   # hot flashes-grade Improved; on Effexor; improved.    # recommend port explantation. Pt agrees. Refer to Dr.Byrnett.   #Follow-up with me in 4 months/ labs.    Orders Placed This Encounter  Procedures  . CBC with Differential    Standing Status:   Future    Standing Expiration Date:   01/26/2017  . Comprehensive metabolic panel    Standing Status:   Future    Standing Expiration Date:   01/26/2017  . Ambulatory referral to General Surgery    Referral Priority:   Routine    Referral Type:   Surgical    Referral Reason:   Specialty Services Required    Referred to Provider:   Robert Bellow, MD    Requested Specialty:   General Surgery    Number of Visits Requested:   1   All questions were answered. The patient knows to call the clinic with any problems, questions or concerns.      Cammie Sickle, MD  01/27/2016 4:32 PM

## 2016-02-11 ENCOUNTER — Encounter: Payer: Self-pay | Admitting: General Surgery

## 2016-02-11 ENCOUNTER — Ambulatory Visit (INDEPENDENT_AMBULATORY_CARE_PROVIDER_SITE_OTHER): Payer: Medicare HMO | Admitting: General Surgery

## 2016-02-11 VITALS — BP 128/80 | HR 84 | Resp 12 | Ht 64.0 in | Wt 291.0 lb

## 2016-02-11 DIAGNOSIS — C50911 Malignant neoplasm of unspecified site of right female breast: Secondary | ICD-10-CM | POA: Diagnosis not present

## 2016-02-11 HISTORY — PX: PORT-A-CATH REMOVAL: SHX5289

## 2016-02-11 NOTE — Patient Instructions (Addendum)
The patient is aware to call back for any questions or concerns. Keep area clean May remove dressing in 2-3 days, steri strips will gradually fall off Use ice pack today for comfort

## 2016-02-11 NOTE — Progress Notes (Signed)
Patient ID: Maria Gutierrez, female   DOB: 1940/12/09, 75 y.o.   MRN: [id number]  Chief Complaint  Patient presents with  . Procedure    HPI Maria Gutierrez is a 75 y.o. female.  Here today for port removal.  HPI  Past Medical History:  Diagnosis Date  . Anxiety   . Arthritis   . Breast cancer of upper-outer quadrant of right female breast ([place]) 06/10/2014   T1b,Nx; ER 90%, PR 50-90%, HER-2/neu 2+, FISH positive.  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 GERD (gastroesophageal reflux disease)   . Heart murmur   . Hyperlipidemia   . Hypertension   . SOB (shortness of breath) 12/03/2014    Past Surgical History:  Procedure Laterality Date  . ABDOMINAL HYSTERECTOMY    . AXILLARY LYMPH NODE BIOPSY Right 07/14/2014   Procedure: AXILLARY LYMPH NODE BIOPSY;  Surgeon: Robert Bellow, MD;  Location: [facility] ORS;  Service: General;  Laterality: Right;  . BREAST BIOPSY Left 2015  . BREAST EXCISIONAL BIOPSY Right 06/10/2014   DCIS rad and chemo  . BREAST SURGERY Right 06/10/14   Wide excision for intermediate grade DCIS, identification of a 10  millimeter area of invasive mammary carcinoma.  . CHOLECYSTECTOMY  2012  . COLONOSCOPY  2009  . EYE SURGERY Bilateral 2014  . JOINT REPLACEMENT Left 2001  . JOINT REPLACEMENT Right 2003  . JOINT REPLACEMENT Left 2004   replacement joint broken  . kneee surgery Left   . PORT-A-CATH REMOVAL  02/11/2016   Dr Bary Castilla  . PORTACATH PLACEMENT Left 08/27/2014   Procedure: INSERTION PORT-A-CATH;  Surgeon: Robert Bellow, MD;  Location: [facility] ORS;  Service: General;  Laterality: Left;    No family history on file.  Social History Social History  Substance Use Topics  . Smoking status: Never Smoker  . Smokeless tobacco: Never Used  . Alcohol use No    Allergies  Allergen Reactions  . Tape Other (See Comments)    Blisters on skin from tape during recent surgery.    Current Outpatient Prescriptions  Medication Sig Dispense  Refill  . calcium carbonate (OS-CAL) 600 MG TABS tablet Take 600 mg by mouth daily.    . ergocalciferol (VITAMIN D2) 50000 units capsule Take 1 capsule (50,000 Units total) by mouth once a week. 12 capsule 1  . fluticasone (FLONASE) 50 MCG/ACT nasal spray Place 1 spray into both nostrils daily. 16 g 2  . Glucosamine-Chondroit-Vit C-Mn (GLUCOSAMINE 1500 COMPLEX PO) Take by mouth.    . hydrochlorothiazide (HYDRODIURIL) 25 MG tablet Take 25 mg by mouth daily.     [name] HYDROcodone-acetaminophen (NORCO) 5-325 MG tablet Take 1 tablet by mouth every 6 (six) hours as needed for moderate pain. 30 tablet 0  . lansoprazole (PREVACID) 15 MG capsule Take 15 mg by mouth every morning.     . lidocaine-prilocaine (EMLA) cream Apply 1 application topically as needed. 30 g 3  . meloxicam (MOBIC) 15 MG tablet Take 1 tablet (15 mg total) by mouth daily as needed for pain. 30 tablet 3  . metoprolol succinate (TOPROL-XL) 50 MG 24 hr tablet Take 50 mg by mouth every morning. Take with or immediately following a meal.    . Multiple Vitamins-Minerals (MULTIVITAMIN) tablet Take 1 tablet by mouth daily.    . simvastatin (ZOCOR) 40 MG tablet Take 40 mg by mouth at bedtime.     . tamoxifen (NOLVADEX) 20 MG tablet Take 1 tablet (20 mg total) by  mouth daily. 90 tablet 4  . venlafaxine XR (EFFEXOR-XR) 37.5 MG 24 hr capsule Take 1 capsule (37.5 mg total) by mouth daily with breakfast. 30 capsule 3   No current facility-administered medications for this visit.    Facility-Administered Medications Ordered in Other Visits  Medication Dose Route Frequency Provider Last Rate Last Dose  . sodium chloride 0.9 % injection 10 mL  10 mL Intravenous PRN Forest Gleason, MD   10 mL at 01/27/15 1416  . sodium chloride flush (NS) 0.9 % injection 10 mL  10 mL Intravenous PRN Cammie Sickle, MD   10 mL at 12/10/15 1347    Review of Systems Review of Systems  Constitutional: Negative.   Respiratory: Negative.   Cardiovascular: Negative.      Blood pressure 128/80, pulse 84, resp. rate 12, height '5\' 4"'  (1.626 m), weight 291 lb (132 kg).  Physical Exam Physical Exam  Constitutional: She is oriented to person, place, and time. She appears well-developed and well-nourished.  Neurological: She is alert and oriented to person, place, and time.  Skin: Skin is warm and dry.  Psychiatric: Her behavior is normal.    Data Reviewed Request for port removal reviewed.  Assessment    Candidate for removal of PowerPort.    Plan    The procedure was reviewed and the patient was amenable to proceed.  Alcohol prep followed by 10 mL of 0.5% Xylocaine with 0.25% Marcaine with 1-200,000 of epinephrine. The original scar was opened and the left chest and the port exposed. Previously placed transfixion sutures were removed. The port was removed intact. Small amount of bleeding from the tract was controlled with pressure and a 3-0 Vicryl figure-of-eight suture and adipose tissue. The adipose layer was approximated with a running 3-0 Vicryl suture. The skin was closed with a 3-0 Vicryl subcuticular suture. Benzoin, Steri-Strips, Telfa and Tegaderm dressing applied. Ice pack provided.  Postoperative wound care reviewed.  The patient may return next week for nursing check if she desires. She was encouraged to call if she has any concerns. Follow up otherwise will be as scheduled for spring 2018 for her breast exam.    Follow up as scheduled in May. The patient is aware to call back for any questions or concerns.   This information has been scribed by Karie Fetch RN, BSN,BC.   Robert Bellow 02/12/2016, 6:18 PM

## 2016-02-12 ENCOUNTER — Encounter: Payer: Self-pay | Admitting: General Surgery

## 2016-03-18 ENCOUNTER — Other Ambulatory Visit: Payer: Self-pay | Admitting: *Deleted

## 2016-03-18 DIAGNOSIS — C50919 Malignant neoplasm of unspecified site of unspecified female breast: Secondary | ICD-10-CM

## 2016-03-18 MED ORDER — TAMOXIFEN CITRATE 20 MG PO TABS: 20.0000 mg | ORAL_TABLET | Freq: Every day | ORAL | 0 refills | Status: DC

## 2016-03-18 MED ORDER — ERGOCALCIFEROL 1.25 MG (50000 UT) PO CAPS: 50000.0000 [IU] | ORAL_CAPSULE | ORAL | 0 refills | Status: DC

## 2016-03-18 NOTE — Telephone Encounter (Signed)
Received fax refill request from [facility] for Tamoxifen and Vit D2, called pt to verify as this pharmacy is not listed on her preferred pharmacies, Per pt, her new insurance requires Rx to be provided by [facility], sending over 3 month supply of Tamoxifen and Vitamin D2 to [facility] as requested by the patient.

## 2016-04-06 ENCOUNTER — Other Ambulatory Visit: Payer: Self-pay | Admitting: *Deleted

## 2016-04-06 MED ORDER — VENLAFAXINE HCL ER 37.5 MG PO CP24: 37.5000 mg | ORAL_CAPSULE | Freq: Every day | ORAL | 1 refills | Status: DC

## 2016-05-06 ENCOUNTER — Other Ambulatory Visit: Payer: Self-pay | Admitting: Internal Medicine

## 2016-05-06 DIAGNOSIS — C50919 Malignant neoplasm of unspecified site of unspecified female breast: Secondary | ICD-10-CM

## 2016-05-26 ENCOUNTER — Inpatient Hospital Stay: Payer: Medicare Other | Attending: Internal Medicine | Admitting: Internal Medicine

## 2016-05-26 ENCOUNTER — Other Ambulatory Visit: Payer: Self-pay | Admitting: General Surgery

## 2016-05-26 ENCOUNTER — Inpatient Hospital Stay: Payer: Medicare Other

## 2016-05-26 DIAGNOSIS — F419 Anxiety disorder, unspecified: Secondary | ICD-10-CM | POA: Insufficient documentation

## 2016-05-26 DIAGNOSIS — Z79899 Other long term (current) drug therapy: Secondary | ICD-10-CM | POA: Diagnosis not present

## 2016-05-26 DIAGNOSIS — C50411 Malignant neoplasm of upper-outer quadrant of right female breast: Secondary | ICD-10-CM | POA: Diagnosis not present

## 2016-05-26 DIAGNOSIS — I10 Essential (primary) hypertension: Secondary | ICD-10-CM | POA: Diagnosis not present

## 2016-05-26 DIAGNOSIS — M1990 Unspecified osteoarthritis, unspecified site: Secondary | ICD-10-CM | POA: Diagnosis not present

## 2016-05-26 DIAGNOSIS — Z170 Estrogen receptor positive status [ER+]: Principal | ICD-10-CM

## 2016-05-26 DIAGNOSIS — N951 Menopausal and female climacteric states: Secondary | ICD-10-CM | POA: Diagnosis not present

## 2016-05-26 DIAGNOSIS — C50811 Malignant neoplasm of overlapping sites of right female breast: Secondary | ICD-10-CM

## 2016-05-26 DIAGNOSIS — Z9221 Personal history of antineoplastic chemotherapy: Secondary | ICD-10-CM | POA: Insufficient documentation

## 2016-05-26 DIAGNOSIS — E785 Hyperlipidemia, unspecified: Secondary | ICD-10-CM | POA: Diagnosis not present

## 2016-05-26 DIAGNOSIS — E876 Hypokalemia: Secondary | ICD-10-CM | POA: Insufficient documentation

## 2016-05-26 DIAGNOSIS — Z79810 Long term (current) use of selective estrogen receptor modulators (SERMs): Secondary | ICD-10-CM | POA: Insufficient documentation

## 2016-05-26 DIAGNOSIS — K219 Gastro-esophageal reflux disease without esophagitis: Secondary | ICD-10-CM | POA: Insufficient documentation

## 2016-05-26 DIAGNOSIS — Z853 Personal history of malignant neoplasm of breast: Secondary | ICD-10-CM

## 2016-05-26 LAB — CBC WITH DIFFERENTIAL/PLATELET
Basophils Absolute: 0.1 10*3/uL (ref 0–0.1)
Basophils Relative: 1 %
EOS PCT: 1 %
Eosinophils Absolute: 0.1 10*3/uL (ref 0–0.7)
HCT: 40.9 % (ref 35.0–47.0)
HEMOGLOBIN: 14.2 g/dL (ref 12.0–16.0)
LYMPHS ABS: 2.8 10*3/uL (ref 1.0–3.6)
LYMPHS PCT: 35 %
MCH: 31.9 pg (ref 26.0–34.0)
MCHC: 34.8 g/dL (ref 32.0–36.0)
MCV: 91.5 fL (ref 80.0–100.0)
MONOS PCT: 8 %
Monocytes Absolute: 0.7 10*3/uL (ref 0.2–0.9)
Neutro Abs: 4.4 10*3/uL (ref 1.4–6.5)
Neutrophils Relative %: 55 %
PLATELETS: 174 10*3/uL (ref 150–440)
RBC: 4.47 MIL/uL (ref 3.80–5.20)
RDW: 12.7 % (ref 11.5–14.5)
WBC: 8 10*3/uL (ref 3.6–11.0)

## 2016-05-26 LAB — COMPREHENSIVE METABOLIC PANEL
ALK PHOS: 46 U/L (ref 38–126)
ALT: 17 U/L (ref 14–54)
ANION GAP: 6 (ref 5–15)
AST: 25 U/L (ref 15–41)
Albumin: 3.7 g/dL (ref 3.5–5.0)
BUN: 21 mg/dL — ABNORMAL HIGH (ref 6–20)
CALCIUM: 9.3 mg/dL (ref 8.9–10.3)
CO2: 27 mmol/L (ref 22–32)
CREATININE: 1.14 mg/dL — AB (ref 0.44–1.00)
Chloride: 105 mmol/L (ref 101–111)
GFR, EST AFRICAN AMERICAN: 54 mL/min — AB (ref >60)
GFR, EST NON AFRICAN AMERICAN: 46 mL/min — AB (ref >60)
Glucose, Bld: 95 mg/dL (ref 65–99)
Potassium: 3.1 mmol/L — ABNORMAL LOW (ref 3.5–5.1)
Sodium: 138 mmol/L (ref 135–145)
TOTAL PROTEIN: 6.8 g/dL (ref 6.5–8.1)
Total Bilirubin: 1.1 mg/dL (ref 0.3–1.2)

## 2016-05-26 MED ORDER — VENLAFAXINE HCL ER 75 MG PO CP24: 75.0000 mg | ORAL_CAPSULE | Freq: Every day | ORAL | 3 refills | Status: DC

## 2016-05-26 NOTE — Progress Notes (Signed)
[place] OFFICE PROGRESS NOTE  Patient Care Team: [name], MD as PCP - General (Family Medicine) [name], MD as Referring Physician (Family Medicine) Robert Bellow, MD (General Surgery)  Cancer Staging No matching staging information was found for the patient.    Oncology History   1.  Carcinoma of right breast.  Status post lumpectomy and sentinel lymph node evaluation.  Tumor size 1 cm.  Estrogen and progesterone receptor positive.  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  Diagnosis in April of 2016 2.  Accelerated partial breast radiation in June of 2016 3.  Weekly Taxol and Herceptin therapy for adjuvant treatment starting from July of 2016; Finished herceptin July 2017   # 5.Patient has been taken off letrozole because of bony pain and started on tamoxifen 20 mg by mouth daily from May 12, 2015; Tamoxifen [Hx of TAH]      Carcinoma of overlapping sites of right breast in female, estrogen receptor positive ([place])       INTERVAL HISTORY:  Maria Gutierrez 76 y.o.  female pleasant patient above history of Breast cancer currently on antihormone therapy is here for follow-up. Patient had been off tamoxifen for about 6 weeks.; She was also started on high-dose vitamin D/glucosamine-chondroitin.  She has noticed significant improvement of her arthritis/muscle pain. Hot flashes improved.  Denies any headaches. Denies any vision changes. No nausea no vomiting. No bone pain.  REVIEW OF SYSTEMS:  A complete 10 point review of system is done which is negative except mentioned above/history of present illness.   PAST MEDICAL HISTORY :  Past Medical History:  Diagnosis Date  . Anxiety   . Arthritis   . Breast cancer of upper-outer quadrant of right female breast ([place]) 06/10/2014   T1b,Nx; ER 90%, PR 50-90%, HER-2/neu 2+, FISH positive.  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 GERD (gastroesophageal reflux disease)   . Heart murmur   .  Hyperlipidemia   . Hypertension   . SOB (shortness of breath) 12/03/2014    PAST SURGICAL HISTORY :   Past Surgical History:  Procedure Laterality Date  . ABDOMINAL HYSTERECTOMY    . AXILLARY LYMPH NODE BIOPSY Right 07/14/2014   Procedure: AXILLARY LYMPH NODE BIOPSY;  Surgeon: Robert Bellow, MD;  Location: [facility] ORS;  Service: General;  Laterality: Right;  . BREAST BIOPSY Left 2015  . BREAST EXCISIONAL BIOPSY Right 06/10/2014   DCIS rad and chemo  . BREAST SURGERY Right 06/10/14   Wide excision for intermediate grade DCIS, identification of a 10  millimeter area of invasive mammary carcinoma.  . CHOLECYSTECTOMY  2012  . COLONOSCOPY  2009  . EYE SURGERY Bilateral 2014  . JOINT REPLACEMENT Left 2001  . JOINT REPLACEMENT Right 2003  . JOINT REPLACEMENT Left 2004   replacement joint broken  . kneee surgery Left   . PORT-A-CATH REMOVAL  02/11/2016   Dr Bary Castilla  . PORTACATH PLACEMENT Left 08/27/2014   Procedure: INSERTION PORT-A-CATH;  Surgeon: Robert Bellow, MD;  Location: [facility] ORS;  Service: General;  Laterality: Left;    FAMILY HISTORY :  No family history on file.  SOCIAL HISTORY:   Social History  Substance Use Topics  . Smoking status: Never Smoker  . Smokeless tobacco: Never Used  . Alcohol use No    ALLERGIES:  is allergic to tape.  MEDICATIONS:  Current Outpatient Prescriptions  Medication Sig Dispense Refill  . calcium carbonate (OS-CAL) 600 MG TABS tablet Take 600 mg by  mouth daily.    . ergocalciferol (VITAMIN D2) 50000 units capsule Take 1 capsule (50,000 Units total) by mouth once a week. 12 capsule 0  . Glucosamine-Chondroit-Vit C-Mn (GLUCOSAMINE 1500 COMPLEX PO) Take 1 tablet by mouth daily.     . hydrochlorothiazide (HYDRODIURIL) 25 MG tablet Take 25 mg by mouth daily.     . lansoprazole (PREVACID) 15 MG capsule Take 15 mg by mouth every morning.     . meloxicam (MOBIC) 15 MG tablet Take 1 tablet (15 mg total) by mouth daily as needed for pain. 30 tablet  3  . metoprolol succinate (TOPROL-XL) 50 MG 24 hr tablet Take 50 mg by mouth every morning. Take with or immediately following a meal.    . Multiple Vitamins-Minerals (MULTIVITAMIN) tablet Take 1 tablet by mouth daily.    . tamoxifen (NOLVADEX) 20 MG tablet TAKE 1 TABLET BY MOUTH  DAILY 90 tablet 0  . venlafaxine XR (EFFEXOR-XR) 75 MG 24 hr capsule Take 1 capsule (75 mg total) by mouth daily with breakfast. 30 capsule 3  . fluticasone (FLONASE) 50 MCG/ACT nasal spray Place 1 spray into both nostrils daily. (Patient not taking: Reported on 05/26/2016) 16 g 2   No current facility-administered medications for this visit.    Facility-Administered Medications Ordered in Other Visits  Medication Dose Route Frequency Provider Last Rate Last Dose  . sodium chloride 0.9 % injection 10 mL  10 mL Intravenous PRN Forest Gleason, MD   10 mL at 01/27/15 1416  . sodium chloride flush (NS) 0.9 % injection 10 mL  10 mL Intravenous PRN Cammie Sickle, MD   10 mL at 12/10/15 1347    PHYSICAL EXAMINATION: ECOG PERFORMANCE STATUS: 0 - Asymptomatic  There were no vitals taken for this visit.  There were no vitals filed for this visit.  GENERAL: Well-nourished well-developed; Alert, no distress and comfortable.   Accompanied by her husband.  EYES: no pallor or icterus OROPHARYNX: no thrush or ulceration; good dentition  NECK: supple, no masses felt LYMPH:  no palpable lymphadenopathy in the cervical, axillary or inguinal regions LUNGS: clear to auscultation and  No wheeze or crackles HEART/CVS: regular rate & rhythm and no murmurs; No lower extremity edema ABDOMEN:abdomen soft, non-tender and normal bowel sounds Musculoskeletal:no cyanosis of digits and no clubbing  PSYCH: alert & oriented x 3 with fluent speech NEURO: no focal motor/sensory deficits SKIN:  no rashes or significant lesions  Right and left BREAST exam [in the presence of nurse]- no unusual skin changes or dominant masses felt.  Surgical scars noted.    LABORATORY DATA:  I have reviewed the data as listed    Component Value Date/Time   NA 138 05/26/2016 1035   NA 141 06/05/2014 0843   K 3.1 (L) 05/26/2016 1035   K 3.5 06/05/2014 0843   CL 105 05/26/2016 1035   CL 105 06/05/2014 0843   CO2 27 05/26/2016 1035   CO2 27 06/05/2014 0843   GLUCOSE 95 05/26/2016 1035   GLUCOSE 94 06/05/2014 0843   BUN 21 (H) 05/26/2016 1035   BUN 22 (H) 06/05/2014 0843   CREATININE 1.14 (H) 05/26/2016 1035   CREATININE 0.97 06/05/2014 0843   CALCIUM 9.3 05/26/2016 1035   CALCIUM 9.2 06/05/2014 0843   PROT 6.8 05/26/2016 1035   ALBUMIN 3.7 05/26/2016 1035   AST 25 05/26/2016 1035   ALT 17 05/26/2016 1035   ALKPHOS 46 05/26/2016 1035   BILITOT 1.1 05/26/2016 1035   GFRNONAA 46 (L)  05/26/2016 1035   GFRNONAA 58 (L) 06/05/2014 0843   GFRAA 54 (L) 05/26/2016 1035   GFRAA >60 06/05/2014 0843    No results found for: SPEP, UPEP  Lab Results  Component Value Date   WBC 8.0 05/26/2016   NEUTROABS 4.4 05/26/2016   HGB 14.2 05/26/2016   HCT 40.9 05/26/2016   MCV 91.5 05/26/2016   PLT 174 05/26/2016      Chemistry      Component Value Date/Time   NA 138 05/26/2016 1035   NA 141 06/05/2014 0843   K 3.1 (L) 05/26/2016 1035   K 3.5 06/05/2014 0843   CL 105 05/26/2016 1035   CL 105 06/05/2014 0843   CO2 27 05/26/2016 1035   CO2 27 06/05/2014 0843   BUN 21 (H) 05/26/2016 1035   BUN 22 (H) 06/05/2014 0843   CREATININE 1.14 (H) 05/26/2016 1035   CREATININE 0.97 06/05/2014 0843      Component Value Date/Time   CALCIUM 9.3 05/26/2016 1035   CALCIUM 9.2 06/05/2014 0843   ALKPHOS 46 05/26/2016 1035   AST 25 05/26/2016 1035   ALT 17 05/26/2016 1035   BILITOT 1.1 05/26/2016 1035       RADIOGRAPHIC STUDIES: I have personally reviewed the radiological images as listed and agreed with the findings in the report. No results found.   ASSESSMENT & PLAN:  Carcinoma of overlapping sites of right breast in female,  estrogen receptor positive ([place]) # Breast cancer ER/PR/her 2 Neu-POSITIVE; stage I; status post adjuvant radiation/MammoSite followed by  Herceptin adjuvant chemotherapy. Mammogram due in May 2018  # Clinically no evidence of recurrence. Discussed the excellent prognosis. 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  vitamin D high-dose 50,000 units weekly; Osteo Bi-Flex exercise. Improved. Continue the same.   # hot flashes-grade Improved; not resolved; recommend increasing the dose of Effexor to 75; pt will call for refills/mail in; if helping.   # Hypokalemia- K- 3.1; sec to HCTZ. Recommend increasing dietary intake.   #Follow-up with me in 4 months/no labs.    No orders of the defined types were placed in this encounter.  All questions were answered. The patient knows to call the clinic with any problems, questions or concerns.      Cammie Sickle, MD 05/26/2016 12:13 PM

## 2016-05-26 NOTE — Assessment & Plan Note (Addendum)
#   Breast cancer ER/PR/her 2 Neu-POSITIVE; stage I; status post adjuvant radiation/MammoSite followed by  Herceptin adjuvant chemotherapy. Mammogram due in May 2018  # Clinically no evidence of recurrence. Discussed the excellent prognosis. 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  vitamin D high-dose 50,000 units weekly; Osteo Bi-Flex exercise. Improved. Continue the same.   # hot flashes-grade Improved; not resolved; recommend increasing the dose of Effexor to 75; pt will call for refills/mail in; if helping.   # Hypokalemia- K- 3.1; sec to HCTZ. Recommend increasing dietary intake.   #Follow-up with me in 4 months/no labs.

## 2016-05-27 ENCOUNTER — Inpatient Hospital Stay: Payer: Medicare Other

## 2016-05-27 ENCOUNTER — Inpatient Hospital Stay: Payer: Medicare Other | Admitting: Internal Medicine

## 2016-06-14 ENCOUNTER — Other Ambulatory Visit: Payer: Self-pay | Admitting: Internal Medicine

## 2016-06-14 MED ORDER — VENLAFAXINE HCL ER 75 MG PO CP24: 75.0000 mg | ORAL_CAPSULE | Freq: Every day | ORAL | 1 refills | Status: DC

## 2016-07-21 ENCOUNTER — Ambulatory Visit
Admission: RE | Admit: 2016-07-21 | Discharge: 2016-07-21 | Disposition: A | Discharge status: Home / Self Care | Payer: Medicare Other | Source: Ambulatory Visit | Attending: General Surgery | Admitting: General Surgery

## 2016-07-21 DIAGNOSIS — Z853 Personal history of malignant neoplasm of breast: Secondary | ICD-10-CM

## 2016-07-21 DIAGNOSIS — Z1239 Encounter for other screening for malignant neoplasm of breast: Secondary | ICD-10-CM | POA: Diagnosis not present

## 2016-08-02 ENCOUNTER — Encounter: Payer: Self-pay | Admitting: General Surgery

## 2016-08-02 ENCOUNTER — Other Ambulatory Visit: Payer: Self-pay | Admitting: Internal Medicine

## 2016-08-02 ENCOUNTER — Ambulatory Visit (INDEPENDENT_AMBULATORY_CARE_PROVIDER_SITE_OTHER): Payer: Medicare Other | Admitting: General Surgery

## 2016-08-02 VITALS — BP 128/84 | HR 94 | Resp 14 | Ht 63.0 in | Wt 278.0 lb

## 2016-08-02 DIAGNOSIS — C50911 Malignant neoplasm of unspecified site of right female breast: Secondary | ICD-10-CM

## 2016-08-02 DIAGNOSIS — C50919 Malignant neoplasm of unspecified site of unspecified female breast: Secondary | ICD-10-CM

## 2016-08-02 NOTE — Progress Notes (Signed)
Patient ID: Maria Gutierrez, female   DOB: 06-28-1940, 76 y.o.   MRN: [id number]  Chief Complaint  Patient presents with  . Follow-up    mammogram    HPI ANGELIS Gutierrez is a 76 y.o. female.  who presents for her follow up breast cancer and a breast evaluation. The most recent mammogram was done on 07-21-16.  Patient does perform regular self breast checks and gets regular mammograms done.   She has had 2 UTI's in past month and is currently on Cipro.  HPI  Past Medical History:  Diagnosis Date  . Anxiety   . Arthritis   . Breast cancer of upper-outer quadrant of right female breast ([name]) 06/10/2014   T1b,Nx; ER 90%, PR 50-90%, HER-2/neu 2+, FISH positive.  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 GERD (gastroesophageal reflux disease)   . Heart murmur   . Hyperlipidemia   . Hypertension   . SOB (shortness of breath) 12/03/2014    Past Surgical History:  Procedure Laterality Date  . ABDOMINAL HYSTERECTOMY    . AXILLARY LYMPH NODE BIOPSY Right 07/14/2014   Procedure: AXILLARY LYMPH NODE BIOPSY;  Surgeon: Robert Bellow, MD;  Location: [facility] ORS;  Service: General;  Laterality: Right;  . BREAST BIOPSY Left 2015  . BREAST EXCISIONAL BIOPSY Right 06/10/2014   DCIS rad and chemo  . BREAST LUMPECTOMY Right   . BREAST SURGERY Right 06/10/14   Wide excision for intermediate grade DCIS, identification of a 10  millimeter area of invasive mammary carcinoma.  . CHOLECYSTECTOMY  2012  . COLONOSCOPY  2009  . EYE SURGERY Bilateral 2014  . JOINT REPLACEMENT Left 2001  . JOINT REPLACEMENT Right 2003  . JOINT REPLACEMENT Left 2004   replacement joint broken  . kneee surgery Left   . PORT-A-CATH REMOVAL  02/11/2016   Dr Bary Castilla  . PORTACATH PLACEMENT Left 08/27/2014   Procedure: INSERTION PORT-A-CATH;  Surgeon: Robert Bellow, MD;  Location: [facility] ORS;  Service: General;  Laterality: Left;    No family history on file.  Social History Social History  Substance Use  Topics  . Smoking status: Never Smoker  . Smokeless tobacco: Never Used  . Alcohol use No    Allergies  Allergen Reactions  . Tape Other (See Comments)    Blisters on skin from tape during recent surgery.    Current Outpatient Prescriptions  Medication Sig Dispense Refill  . aspirin EC 81 MG tablet Take 81 mg by mouth daily.    . calcium carbonate (OS-CAL) 600 MG TABS tablet Take 600 mg by mouth daily.    . ciprofloxacin (CIPRO) 250 MG tablet Take 250 mg by mouth 2 (two) times daily.    . fluticasone (FLONASE) 50 MCG/ACT nasal spray Place 1 spray into both nostrils daily. 16 g 2  . Glucosamine-Chondroit-Vit C-Mn (GLUCOSAMINE 1500 COMPLEX PO) Take 1 tablet by mouth daily.     . hydrochlorothiazide (HYDRODIURIL) 25 MG tablet Take 25 mg by mouth daily.     . lansoprazole (PREVACID) 15 MG capsule Take 15 mg by mouth every morning.     . meloxicam (MOBIC) 15 MG tablet Take 1 tablet (15 mg total) by mouth daily as needed for pain. 30 tablet 3  . metoprolol succinate (TOPROL-XL) 50 MG 24 hr tablet Take 50 mg by mouth every morning. Take with or immediately following a meal.    . Multiple Vitamins-Minerals (MULTIVITAMIN) tablet Take 1 tablet by mouth daily.    [name]  tamoxifen (NOLVADEX) 20 MG tablet TAKE 1 TABLET BY MOUTH  DAILY 90 tablet 0  . venlafaxine XR (EFFEXOR-XR) 75 MG 24 hr capsule Take 1 capsule (75 mg total) by mouth daily with breakfast. 90 capsule 1  . Vitamin D, Ergocalciferol, (DRISDOL) 50000 units CAPS capsule TAKE 1 CAPSULE BY MOUTH  ONCE EVERY WEEK 12 capsule 1   No current facility-administered medications for this visit.    Facility-Administered Medications Ordered in Other Visits  Medication Dose Route Frequency Provider Last Rate Last Dose  . sodium chloride 0.9 % injection 10 mL  10 mL Intravenous PRN Forest Gleason, MD   10 mL at 01/27/15 1416  . sodium chloride flush (NS) 0.9 % injection 10 mL  10 mL Intravenous PRN Cammie Sickle, MD   10 mL at 12/10/15 1347     Review of Systems Review of Systems  Constitutional: Negative.   Respiratory: Negative.   Cardiovascular: Negative.     Blood pressure 128/84, pulse 94, resp. rate 14, height '5\' 3"'  (1.6 m), weight 278 lb (126.1 kg).  Physical Exam Physical Exam  Constitutional: She is oriented to person, place, and time. She appears well-developed and well-nourished.  HENT:  Mouth/Throat: Oropharynx is clear and moist.  Eyes: Conjunctivae are normal. No scleral icterus.  Neck: Neck supple.  Cardiovascular: Normal rate, regular rhythm and normal heart sounds.   Pulmonary/Chest: Effort normal and breath sounds normal. Right breast exhibits no inverted nipple, no mass, no nipple discharge, no skin change and no tenderness. Left breast exhibits no inverted nipple, no mass, no nipple discharge, no skin change and no tenderness.    Mild indentation of right nipple.  Lymphadenopathy:    She has no cervical adenopathy.    She has no axillary adenopathy.  Neurological: She is alert and oriented to person, place, and time.  Skin: Skin is warm and dry.  Psychiatric: Her behavior is normal.    Data Reviewed 07/21/2016 bilateral diagnostic mammograms were reviewed. Postsurgical changes. BI-RADS-2.  Assessment    No evidence of recurrent breast cancer.  Persistent vasomotor symptoms on Effexor 75 mg daily.    Plan    The patient has been encouraged to discuss with Dr. Rogue Bussing if a higher dose of Effexor would be of benefit.    She admits to hot flashes currently on tamoxifen and Effexor.  The patient has been asked to return to the office in one year with a bilateral diagnostic mammogram. The patient is aware to call back for any questions or concerns. Handicap certificate signed.  HPI, Physical Exam, Assessment and Plan have been scribed under the direction and in the presence of Robert Bellow, MD.  Karie Fetch, RN  I have completed the exam and reviewed the above documentation  for accuracy and completeness.  I agree with the above.  Haematologist has been used and any errors in dictation or transcription are unintentional.  Hervey Ard, M.D., F.A.C.S.     Robert Bellow 08/02/2016, 10:06 PM

## 2016-08-02 NOTE — Patient Instructions (Signed)
The patient is aware to call back for any questions or concerns.  

## 2016-09-23 ENCOUNTER — Inpatient Hospital Stay: Payer: Medicare Other | Attending: Internal Medicine | Admitting: Internal Medicine

## 2016-09-23 DIAGNOSIS — Z923 Personal history of irradiation: Secondary | ICD-10-CM | POA: Insufficient documentation

## 2016-09-23 DIAGNOSIS — F419 Anxiety disorder, unspecified: Secondary | ICD-10-CM | POA: Diagnosis not present

## 2016-09-23 DIAGNOSIS — Z9221 Personal history of antineoplastic chemotherapy: Secondary | ICD-10-CM | POA: Diagnosis not present

## 2016-09-23 DIAGNOSIS — M1990 Unspecified osteoarthritis, unspecified site: Secondary | ICD-10-CM | POA: Diagnosis not present

## 2016-09-23 DIAGNOSIS — Z79810 Long term (current) use of selective estrogen receptor modulators (SERMs): Secondary | ICD-10-CM | POA: Diagnosis not present

## 2016-09-23 DIAGNOSIS — Z7982 Long term (current) use of aspirin: Secondary | ICD-10-CM | POA: Diagnosis not present

## 2016-09-23 DIAGNOSIS — C50811 Malignant neoplasm of overlapping sites of right female breast: Secondary | ICD-10-CM

## 2016-09-23 DIAGNOSIS — C50411 Malignant neoplasm of upper-outer quadrant of right female breast: Secondary | ICD-10-CM

## 2016-09-23 DIAGNOSIS — N951 Menopausal and female climacteric states: Secondary | ICD-10-CM | POA: Insufficient documentation

## 2016-09-23 DIAGNOSIS — Z170 Estrogen receptor positive status [ER+]: Secondary | ICD-10-CM | POA: Diagnosis not present

## 2016-09-23 DIAGNOSIS — Z79811 Long term (current) use of aromatase inhibitors: Secondary | ICD-10-CM

## 2016-09-23 DIAGNOSIS — I10 Essential (primary) hypertension: Secondary | ICD-10-CM | POA: Insufficient documentation

## 2016-09-23 DIAGNOSIS — K219 Gastro-esophageal reflux disease without esophagitis: Secondary | ICD-10-CM | POA: Diagnosis not present

## 2016-09-23 DIAGNOSIS — E785 Hyperlipidemia, unspecified: Secondary | ICD-10-CM | POA: Diagnosis not present

## 2016-09-23 NOTE — Progress Notes (Signed)
[facility] OFFICE PROGRESS NOTE  Patient Care Team: [name], MD as PCP - General (Family Medicine) [name], MD as Referring Physician (Family Medicine) Bary Castilla Forest Gleason, MD (General Surgery)  Cancer Staging No matching staging information was found for the patient.    Oncology History   1.  Carcinoma of right breast.  Status post lumpectomy and sentinel lymph node evaluation.  Tumor size 1 cm.  Estrogen and progesterone receptor positive.  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  Diagnosis in April of 2016 2.  Accelerated partial breast radiation in June of 2016 3.  Weekly Taxol and Herceptin therapy for adjuvant treatment starting from July of 2016; Finished herceptin July 2017   # 5.Patient has been taken off letrozole because of bony pain and started on tamoxifen 20 mg by mouth daily from May 12, 2015; Tamoxifen [Hx of TAH]      Carcinoma of overlapping sites of right breast in female, estrogen receptor positive ([place])       INTERVAL HISTORY:  Maria Gutierrez 76 y.o.  female pleasant patient above history of Breast cancer currently on antihormone therapy is here for follow-up.   Patient was evaluated by surgery; mammogram normal.  Patient continues to have significant hot flashes. Interested in going up on the dose of Effexor  Denies any headaches. Denies any vision changes. No nausea no vomiting. No bone pain.  REVIEW OF SYSTEMS:  A complete 10 point review of system is done which is negative except mentioned above/history of present illness.   PAST MEDICAL HISTORY :  Past Medical History:  Diagnosis Date  . Anxiety   . Arthritis   . Breast cancer of upper-outer quadrant of right female breast ([place]) 06/10/2014   T1b,Nx; ER 90%, PR 50-90%, HER-2/neu 2+, FISH positive.  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 GERD (gastroesophageal reflux disease)   . Heart murmur   . Hyperlipidemia   . Hypertension   . SOB (shortness of breath)  12/03/2014    PAST SURGICAL HISTORY :   Past Surgical History:  Procedure Laterality Date  . ABDOMINAL HYSTERECTOMY    . AXILLARY LYMPH NODE BIOPSY Right 07/14/2014   Procedure: AXILLARY LYMPH NODE BIOPSY;  Surgeon: Robert Bellow, MD;  Location: [facility] ORS;  Service: General;  Laterality: Right;  . BREAST BIOPSY Left 2015  . BREAST EXCISIONAL BIOPSY Right 06/10/2014   DCIS rad and chemo  . BREAST LUMPECTOMY Right   . BREAST SURGERY Right 06/10/14   Wide excision for intermediate grade DCIS, identification of a 10  millimeter area of invasive mammary carcinoma.  . CHOLECYSTECTOMY  2012  . COLONOSCOPY  2009  . EYE SURGERY Bilateral 2014  . JOINT REPLACEMENT Left 2001  . JOINT REPLACEMENT Right 2003  . JOINT REPLACEMENT Left 2004   replacement joint broken  . kneee surgery Left   . PORT-A-CATH REMOVAL  02/11/2016   Dr Bary Castilla  . PORTACATH PLACEMENT Left 08/27/2014   Procedure: INSERTION PORT-A-CATH;  Surgeon: Robert Bellow, MD;  Location: [facility] ORS;  Service: General;  Laterality: Left;    FAMILY HISTORY :  No family history on file.  SOCIAL HISTORY:   Social History  Substance Use Topics  . Smoking status: Never Smoker  . Smokeless tobacco: Never Used  . Alcohol use No    ALLERGIES:  is allergic to tape.  MEDICATIONS:  Current Outpatient Prescriptions  Medication Sig Dispense Refill  . aspirin EC 81 MG tablet Take 81 mg by  mouth daily.    . calcium carbonate (OS-CAL) 600 MG TABS tablet Take 600 mg by mouth daily.    . fluticasone (FLONASE) 50 MCG/ACT nasal spray Place 1 spray into both nostrils daily. 16 g 2  . Glucosamine-Chondroit-Vit C-Mn (GLUCOSAMINE 1500 COMPLEX PO) Take 1 tablet by mouth daily.     . hydrochlorothiazide (HYDRODIURIL) 25 MG tablet Take 25 mg by mouth daily.     . lansoprazole (PREVACID) 15 MG capsule Take 15 mg by mouth every morning.     . meloxicam (MOBIC) 15 MG tablet Take 1 tablet (15 mg total) by mouth daily as needed for pain. 30 tablet 3   . metoprolol succinate (TOPROL-XL) 50 MG 24 hr tablet Take 50 mg by mouth every morning. Take with or immediately following a meal.    . Multiple Vitamins-Minerals (MULTIVITAMIN) tablet Take 1 tablet by mouth daily.    . tamoxifen (NOLVADEX) 20 MG tablet TAKE 1 TABLET BY MOUTH  DAILY 90 tablet 0  . venlafaxine XR (EFFEXOR-XR) 75 MG 24 hr capsule Take 1 capsule (75 mg total) by mouth daily with breakfast. 90 capsule 1  . Vitamin D, Ergocalciferol, (DRISDOL) 50000 units CAPS capsule TAKE 1 CAPSULE BY MOUTH  ONCE EVERY WEEK 12 capsule 1   No current facility-administered medications for this visit.    Facility-Administered Medications Ordered in Other Visits  Medication Dose Route Frequency Provider Last Rate Last Dose  . sodium chloride 0.9 % injection 10 mL  10 mL Intravenous PRN Forest Gleason, MD   10 mL at 01/27/15 1416  . sodium chloride flush (NS) 0.9 % injection 10 mL  10 mL Intravenous PRN Cammie Sickle, MD   10 mL at 12/10/15 1347    PHYSICAL EXAMINATION: ECOG PERFORMANCE STATUS: 0 - Asymptomatic  There were no vitals taken for this visit.  There were no vitals filed for this visit.  GENERAL: Well-nourished well-developed; Alert, no distress and comfortable.   Accompanied by her husband.  EYES: no pallor or icterus OROPHARYNX: no thrush or ulceration; good dentition  NECK: supple, no masses felt LYMPH:  no palpable lymphadenopathy in the cervical, axillary or inguinal regions LUNGS: clear to auscultation and  No wheeze or crackles HEART/CVS: regular rate & rhythm and no murmurs; No lower extremity edema ABDOMEN:abdomen soft, non-tender and normal bowel sounds Musculoskeletal:no cyanosis of digits and no clubbing  PSYCH: alert & oriented x 3 with fluent speech NEURO: no focal motor/sensory deficits SKIN:  no rashes or significant lesions Breast Exam deferred.   LABORATORY DATA:  I have reviewed the data as listed    Component Value Date/Time   NA 138 05/26/2016  1035   NA 141 06/05/2014 0843   K 3.1 (L) 05/26/2016 1035   K 3.5 06/05/2014 0843   CL 105 05/26/2016 1035   CL 105 06/05/2014 0843   CO2 27 05/26/2016 1035   CO2 27 06/05/2014 0843   GLUCOSE 95 05/26/2016 1035   GLUCOSE 94 06/05/2014 0843   BUN 21 (H) 05/26/2016 1035   BUN 22 (H) 06/05/2014 0843   CREATININE 1.14 (H) 05/26/2016 1035   CREATININE 0.97 06/05/2014 0843   CALCIUM 9.3 05/26/2016 1035   CALCIUM 9.2 06/05/2014 0843   PROT 6.8 05/26/2016 1035   ALBUMIN 3.7 05/26/2016 1035   AST 25 05/26/2016 1035   ALT 17 05/26/2016 1035   ALKPHOS 46 05/26/2016 1035   BILITOT 1.1 05/26/2016 1035   GFRNONAA 46 (L) 05/26/2016 1035   GFRNONAA 58 (L) 06/05/2014 0076  GFRAA 54 (L) 05/26/2016 1035   GFRAA >60 06/05/2014 0843    No results found for: SPEP, UPEP  Lab Results  Component Value Date   WBC 8.0 05/26/2016   NEUTROABS 4.4 05/26/2016   HGB 14.2 05/26/2016   HCT 40.9 05/26/2016   MCV 91.5 05/26/2016   PLT 174 05/26/2016      Chemistry      Component Value Date/Time   NA 138 05/26/2016 1035   NA 141 06/05/2014 0843   K 3.1 (L) 05/26/2016 1035   K 3.5 06/05/2014 0843   CL 105 05/26/2016 1035   CL 105 06/05/2014 0843   CO2 27 05/26/2016 1035   CO2 27 06/05/2014 0843   BUN 21 (H) 05/26/2016 1035   BUN 22 (H) 06/05/2014 0843   CREATININE 1.14 (H) 05/26/2016 1035   CREATININE 0.97 06/05/2014 0843      Component Value Date/Time   CALCIUM 9.3 05/26/2016 1035   CALCIUM 9.2 06/05/2014 0843   ALKPHOS 46 05/26/2016 1035   AST 25 05/26/2016 1035   ALT 17 05/26/2016 1035   BILITOT 1.1 05/26/2016 1035       RADIOGRAPHIC STUDIES: I have personally reviewed the radiological images as listed and agreed with the findings in the report. No results found.   ASSESSMENT & PLAN:  Carcinoma of overlapping sites of right breast in female, estrogen receptor positive ([place]) # Breast cancer ER/PR/her 2 Neu-POSITIVE; stage I; status post adjuvant radiation/MammoSite followed by   Herceptin adjuvant chemotherapy.   # Clinically no evidence of recurrence. Continue tamoxifen. Tolerating well except for hot flashes [C discussion below].  # hot flashes-2; recommend increasing the dose of Effexor to add 37.5 [available home] to 75 mg prescription. She will call for new prescription if this helps.  # Arthritis- secondary to antiestrogen therapy. Recommend continued calcium and vitamin D plus Osteo Bi-Flex.   # Risk of osteoporosis- given the postmenopausal state antiestrogen therapy- patient is a risk for osteoporosis. Discussed the multiple treatment options available for osteoporosis. Recommend a bone density test prior to next visit.  #Follow-up with me in 4 months/ lab; bmd prior.   Orders Placed This Encounter  Procedures  . DG Bone Density    Standing Status:   Future    Standing Expiration Date:   09/23/2017    Order Specific Question:   Reason for Exam (SYMPTOM  OR DIAGNOSIS REQUIRED)    Answer:   aromatase inhibitor use    Order Specific Question:   Preferred imaging location?    Answer:   [facility]   All questions were answered. The patient knows to call the clinic with any problems, questions or concerns.      Cammie Sickle, MD 09/23/2016 8:57 PM

## 2016-09-23 NOTE — Progress Notes (Signed)
Patient here for follow-up with Dr. Rogue Bussing for breast cancer. She reports frequent hot flashes not relieved with current dosing of effexor. She reports a recent breast exam with Dr. Bary Castilla.

## 2016-09-23 NOTE — Assessment & Plan Note (Addendum)
#   Breast cancer ER/PR/her 2 Neu-POSITIVE; stage I; status post adjuvant radiation/MammoSite followed by  Herceptin adjuvant chemotherapy.   # Clinically no evidence of recurrence. Continue tamoxifen. Tolerating well except for hot flashes [C discussion below].  # hot flashes-2; recommend increasing the dose of Effexor to add 37.5 [available home] to 75 mg prescription. She will call for new prescription if this helps.  # Arthritis- secondary to antiestrogen therapy. Recommend continued calcium and vitamin D plus Osteo Bi-Flex.   # Risk of osteoporosis- given the postmenopausal state antiestrogen therapy- patient is a risk for osteoporosis. Discussed the multiple treatment options available for osteoporosis. Recommend a bone density test prior to next visit.  #Follow-up with me in 4 months/ lab; bmd prior.

## 2016-09-29 ENCOUNTER — Ambulatory Visit
Admission: RE | Admit: 2016-09-29 | Discharge: 2016-09-29 | Disposition: A | Discharge status: Home / Self Care | Payer: Medicare Other | Source: Ambulatory Visit | Attending: Internal Medicine | Admitting: Internal Medicine

## 2016-09-29 DIAGNOSIS — M419 Scoliosis, unspecified: Secondary | ICD-10-CM | POA: Insufficient documentation

## 2016-09-29 DIAGNOSIS — Z170 Estrogen receptor positive status [ER+]: Secondary | ICD-10-CM

## 2016-09-29 DIAGNOSIS — C50811 Malignant neoplasm of overlapping sites of right female breast: Secondary | ICD-10-CM

## 2016-09-29 DIAGNOSIS — M8588 Other specified disorders of bone density and structure, other site: Secondary | ICD-10-CM | POA: Diagnosis not present

## 2016-09-29 DIAGNOSIS — Z79811 Long term (current) use of aromatase inhibitors: Secondary | ICD-10-CM | POA: Diagnosis present

## 2016-10-05 ENCOUNTER — Telehealth: Payer: Self-pay | Admitting: *Deleted

## 2016-10-05 NOTE — Telephone Encounter (Signed)
Patient called asking for results of Bone scan, she had BMD done 09/29/16   PATIENT BIOGRAPHICAL: Name: Maria Gutierrez, Maria Gutierrez Patient ID: [id number] Birth Date: Feb 25, 1941 Height: 62.5 in. Gender: Female Exam Date: 09/29/2016 Weight: 278.0 lbs. Indications: Advanced Age, Caucasian, Family History of Fracture, Height Loss, History of Breast Cancer, Hysterectomy, Parent Hip Fracture, Postmenopausal, Previous Chemo and Radiation, Family Hist. (Parent hip fracture) Fractures: Treatments: ASPRIN 45 MG, CALCIUM VIT D, Flonase, Multi-Vitamin with calcium, Prevacid, TAMOXIFEN  ASSESSMENT: The BMD measured at Femur Neck Left is 0.863 g/cm2 with a T-score of -1.3. This patient is considered osteopenic according to [place] [facility]) criteria. Lumbar spine was not utilized due to advanced degenerative changes/scoliosis. Site Region Measured Measured WHO Young Adult BMD Date       Age      Classification T-score DualFemur Neck Left 09/29/2016 75.1 Osteopenia -1.3 0.863 g/cm2  Left Forearm Radius 33% 09/29/2016 75.1 Normal 1.4 0.995 g/cm2  World Health Organization [facility]) criteria for post-menopausal, Caucasian Women: Normal:       T-score at or above -1 SD Osteopenia:   T-score between -1 and -2.5 SD Osteoporosis: T-score at or below -2.5 SD  RECOMMENDATIONS: [place] recommends that FDA-approved medical therapies be considered in postmenopausal women and men age 82 or older with a: 1. Hip or vertebral (clinical or morphometric) fracture. 2. T-score of < -2.5 at the spine or hip. 3. Ten-year fracture probability by FRAX of 3% or greater for hip fracture or 20% or greater for major osteoporotic fracture.

## 2016-10-06 ENCOUNTER — Telehealth: Payer: Self-pay | Admitting: Internal Medicine

## 2016-10-06 ENCOUNTER — Other Ambulatory Visit: Payer: Self-pay | Admitting: Internal Medicine

## 2016-10-06 DIAGNOSIS — Z170 Estrogen receptor positive status [ER+]: Principal | ICD-10-CM

## 2016-10-06 DIAGNOSIS — C50811 Malignant neoplasm of overlapping sites of right female breast: Secondary | ICD-10-CM

## 2016-10-06 DIAGNOSIS — M898X9 Other specified disorders of bone, unspecified site: Secondary | ICD-10-CM | POA: Insufficient documentation

## 2016-10-06 NOTE — Telephone Encounter (Signed)
Dr. Jacinto Reap to respond to Hassan Rowan, RN on recommendations.

## 2016-10-06 NOTE — Telephone Encounter (Signed)
Patient informed of Dr Aletha Halim response and her reply was "He has no idea how bad I am hurting, My hips are killing me". I discussed this with Dr B and he ordered a Bone Scan to be done and also to inform her that she has arthritis. I spoke with patient and she is in agreement with the bone scan and stated she is aware that she has arthritis.

## 2016-10-06 NOTE — Progress Notes (Signed)
Patient complains of worsening bone pain. Bone density- osteopenia. Recommend a bone scan for further evaluation.  Maria Gutierrez, Please inform patient.

## 2016-10-06 NOTE — Telephone Encounter (Signed)
Patient has osteopenia on bone density. Recommend continued calcium and vitamin D intake. I could talk to her further treatment options- with pills and shots at next visit/ in Nov 2018. Please inform pt. Thx

## 2016-10-14 ENCOUNTER — Encounter
Admission: RE | Admit: 2016-10-14 | Discharge: 2016-10-14 | Disposition: A | Discharge status: Home / Self Care | Payer: Medicare Other | Source: Ambulatory Visit | Attending: Internal Medicine | Admitting: Internal Medicine

## 2016-10-14 ENCOUNTER — Ambulatory Visit
Admission: RE | Admit: 2016-10-14 | Discharge: 2016-10-14 | Disposition: A | Discharge status: Home / Self Care | Payer: Medicare Other | Source: Ambulatory Visit | Attending: Internal Medicine | Admitting: Internal Medicine

## 2016-10-14 DIAGNOSIS — M898X9 Other specified disorders of bone, unspecified site: Secondary | ICD-10-CM | POA: Diagnosis present

## 2016-10-14 DIAGNOSIS — Z170 Estrogen receptor positive status [ER+]: Secondary | ICD-10-CM | POA: Diagnosis not present

## 2016-10-14 DIAGNOSIS — R938 Abnormal findings on diagnostic imaging of other specified body structures: Secondary | ICD-10-CM | POA: Insufficient documentation

## 2016-10-14 DIAGNOSIS — C50811 Malignant neoplasm of overlapping sites of right female breast: Secondary | ICD-10-CM | POA: Insufficient documentation

## 2016-10-14 MED ORDER — TECHNETIUM TC 99M MEDRONATE IV KIT
25.0000 | PACK | Freq: Once | INTRAVENOUS | Status: AC | PRN
  Administered 2016-10-14: 24.62 via INTRAVENOUS

## 2016-10-17 ENCOUNTER — Telehealth: Payer: Self-pay | Admitting: *Deleted

## 2016-10-17 NOTE — Telephone Encounter (Signed)
-----   Message from Cammie Sickle, MD sent at 10/14/2016 11:10 PM EDT ----- Please inform patient that bone scan is not suggestive of any metastases from cancer; suggestive more arthritis. Recommend follow up with PCP.

## 2016-10-17 NOTE — Telephone Encounter (Signed)
Spoke with patient. Results provided. Also routed results to pcp.

## 2016-10-22 ENCOUNTER — Other Ambulatory Visit: Payer: Self-pay | Admitting: Internal Medicine

## 2016-10-22 DIAGNOSIS — C50919 Malignant neoplasm of unspecified site of unspecified female breast: Secondary | ICD-10-CM

## 2016-10-28 ENCOUNTER — Ambulatory Visit: Payer: Medicare Other | Admitting: Radiation Oncology

## 2016-11-02 ENCOUNTER — Encounter: Payer: Self-pay | Admitting: Radiation Oncology

## 2016-11-02 ENCOUNTER — Ambulatory Visit
Admission: RE | Admit: 2016-11-02 | Discharge: 2016-11-02 | Disposition: A | Discharge status: Home / Self Care | Payer: Medicare Other | Source: Ambulatory Visit | Attending: Radiation Oncology | Admitting: Radiation Oncology

## 2016-11-02 VITALS — Temp 96.7°F | Resp 20 | Wt 269.3 lb

## 2016-11-02 DIAGNOSIS — Z170 Estrogen receptor positive status [ER+]: Secondary | ICD-10-CM | POA: Insufficient documentation

## 2016-11-02 DIAGNOSIS — Z79810 Long term (current) use of selective estrogen receptor modulators (SERMs): Secondary | ICD-10-CM | POA: Diagnosis not present

## 2016-11-02 DIAGNOSIS — C50811 Malignant neoplasm of overlapping sites of right female breast: Secondary | ICD-10-CM | POA: Diagnosis present

## 2016-11-02 DIAGNOSIS — Z923 Personal history of irradiation: Secondary | ICD-10-CM | POA: Diagnosis not present

## 2016-11-02 DIAGNOSIS — Z9221 Personal history of antineoplastic chemotherapy: Secondary | ICD-10-CM | POA: Insufficient documentation

## 2016-11-02 NOTE — Progress Notes (Signed)
Radiation Oncology Follow up Note  Name: Maria Gutierrez   Date:   11/02/2016 MRN:  [id number] DOB: 05-Dec-1940    This 76 y.o. female presents to the clinic today for a 2 year follow-up status post accelerated partial breast irradiation to her right breast for stage I ER/PR positive invasive mammary carcinoma.  REFERRING PROVIDER: [name], MD  HPI: Patient is a 76 year old female now out 2 years having completed accelerated partial breast radiation to her right breast for stage I (T1 N0 M0) ER/PR positive HER-2/neu positive invasive mammary carcinoma. She is seen today in routine follow-up and is doing well. She specifically denies breast tenderness cough or bone pain. She is currently on tamoxifen tolerance and that well without side effect. She had a mammogram back in May which was BI-RADS 2 benign which I have reviewed  COMPLICATIONS OF TREATMENT: none  FOLLOW UP COMPLIANCE: keeps appointments   PHYSICAL EXAM:  Temp (!) 96.7 F (35.9 C)   Resp 20   Wt 269 lb 4.7 oz (122.1 kg)   BMI 47.70 kg/m  Lungs are clear to A&P cardiac examination essentially unremarkable with regular rate and rhythm. No dominant mass or nodularity is noted in either breast in 2 positions examined. Incision is well-healed. No axillary or supraclavicular adenopathy is appreciated. Cosmetic result is excellent. Well-developed well-nourished patient in NAD. HEENT reveals PERLA, EOMI, discs not visualized.  Oral cavity is clear. No oral mucosal lesions are identified. Neck is clear without evidence of cervical or supraclavicular adenopathy. Lungs are clear to A&P. Cardiac examination is essentially unremarkable with regular rate and rhythm without murmur rub or thrill. Abdomen is benign with no organomegaly or masses noted. Motor sensory and DTR levels are equal and symmetric in the upper and lower extremities. Cranial nerves II through XII are grossly intact. Proprioception is intact. No peripheral adenopathy or  edema is identified. No motor or sensory levels are noted. Crude visual fields are within normal range.  RADIOLOGY RESULTS: Previous mammograms reviewed and compatible with the above-stated findings  PLAN: Present time she is now 2 years out from both chemotherapy and accelerated partial breast radiation. Continues to do well with no evidence of disease. She continues on anti-estrogen therapy. She is a rescheduled for next May for follow-up. I am please were overall progress. I have asked to see her back in 1 year for follow-up. She knows to call sooner with any concerns.  I would like to take this opportunity to thank you for allowing me to participate in the care of your patient.Armstead Peaks., MD

## 2016-11-26 ENCOUNTER — Other Ambulatory Visit: Payer: Self-pay | Admitting: Internal Medicine

## 2016-12-08 NOTE — Telephone Encounter (Signed)
Created encounter

## 2016-12-26 ENCOUNTER — Other Ambulatory Visit: Payer: Self-pay

## 2016-12-26 DIAGNOSIS — M5416 Radiculopathy, lumbar region: Secondary | ICD-10-CM

## 2017-01-01 ENCOUNTER — Ambulatory Visit
Admission: RE | Admit: 2017-01-01 | Discharge: 2017-01-01 | Disposition: A | Discharge status: Home / Self Care | Payer: Medicare Other | Source: Ambulatory Visit | Attending: Physical Medicine and Rehabilitation | Admitting: Physical Medicine and Rehabilitation

## 2017-01-01 DIAGNOSIS — M5416 Radiculopathy, lumbar region: Secondary | ICD-10-CM

## 2017-01-27 ENCOUNTER — Inpatient Hospital Stay: Payer: Medicare Other | Attending: Internal Medicine

## 2017-01-27 ENCOUNTER — Other Ambulatory Visit: Payer: Self-pay | Admitting: *Deleted

## 2017-01-27 ENCOUNTER — Inpatient Hospital Stay (HOSPITAL_BASED_OUTPATIENT_CLINIC_OR_DEPARTMENT_OTHER): Payer: Medicare Other | Admitting: Internal Medicine

## 2017-01-27 VITALS — BP 131/84 | HR 90 | Temp 98.1°F | Resp 16 | Wt 264.0 lb

## 2017-01-27 DIAGNOSIS — N183 Chronic kidney disease, stage 3 (moderate): Secondary | ICD-10-CM | POA: Diagnosis not present

## 2017-01-27 DIAGNOSIS — C50811 Malignant neoplasm of overlapping sites of right female breast: Secondary | ICD-10-CM

## 2017-01-27 DIAGNOSIS — M1990 Unspecified osteoarthritis, unspecified site: Secondary | ICD-10-CM | POA: Insufficient documentation

## 2017-01-27 DIAGNOSIS — M419 Scoliosis, unspecified: Secondary | ICD-10-CM | POA: Diagnosis not present

## 2017-01-27 DIAGNOSIS — C50411 Malignant neoplasm of upper-outer quadrant of right female breast: Secondary | ICD-10-CM | POA: Diagnosis not present

## 2017-01-27 DIAGNOSIS — M8580 Other specified disorders of bone density and structure, unspecified site: Secondary | ICD-10-CM | POA: Diagnosis not present

## 2017-01-27 DIAGNOSIS — K219 Gastro-esophageal reflux disease without esophagitis: Secondary | ICD-10-CM | POA: Insufficient documentation

## 2017-01-27 DIAGNOSIS — Z7982 Long term (current) use of aspirin: Secondary | ICD-10-CM | POA: Diagnosis not present

## 2017-01-27 DIAGNOSIS — I129 Hypertensive chronic kidney disease with stage 1 through stage 4 chronic kidney disease, or unspecified chronic kidney disease: Secondary | ICD-10-CM

## 2017-01-27 DIAGNOSIS — M545 Low back pain: Secondary | ICD-10-CM | POA: Diagnosis not present

## 2017-01-27 DIAGNOSIS — M48061 Spinal stenosis, lumbar region without neurogenic claudication: Secondary | ICD-10-CM | POA: Insufficient documentation

## 2017-01-27 DIAGNOSIS — Z79899 Other long term (current) drug therapy: Secondary | ICD-10-CM | POA: Diagnosis not present

## 2017-01-27 DIAGNOSIS — N951 Menopausal and female climacteric states: Secondary | ICD-10-CM | POA: Insufficient documentation

## 2017-01-27 DIAGNOSIS — F419 Anxiety disorder, unspecified: Secondary | ICD-10-CM | POA: Diagnosis not present

## 2017-01-27 DIAGNOSIS — G8929 Other chronic pain: Secondary | ICD-10-CM | POA: Insufficient documentation

## 2017-01-27 DIAGNOSIS — Z9221 Personal history of antineoplastic chemotherapy: Secondary | ICD-10-CM | POA: Insufficient documentation

## 2017-01-27 DIAGNOSIS — Z923 Personal history of irradiation: Secondary | ICD-10-CM | POA: Diagnosis not present

## 2017-01-27 DIAGNOSIS — Z170 Estrogen receptor positive status [ER+]: Secondary | ICD-10-CM

## 2017-01-27 DIAGNOSIS — E785 Hyperlipidemia, unspecified: Secondary | ICD-10-CM | POA: Diagnosis not present

## 2017-01-27 LAB — COMPREHENSIVE METABOLIC PANEL
ALBUMIN: 3.7 g/dL (ref 3.5–5.0)
ALK PHOS: 56 U/L (ref 38–126)
ALT: 18 U/L (ref 14–54)
ANION GAP: 10 (ref 5–15)
AST: 22 U/L (ref 15–41)
BUN: 28 mg/dL — AB (ref 6–20)
CO2: 26 mmol/L (ref 22–32)
Calcium: 9 mg/dL (ref 8.9–10.3)
Chloride: 100 mmol/L — ABNORMAL LOW (ref 101–111)
Creatinine, Ser: 1.17 mg/dL — ABNORMAL HIGH (ref 0.44–1.00)
GFR calc Af Amer: 51 mL/min — ABNORMAL LOW (ref >60)
GFR calc non Af Amer: 44 mL/min — ABNORMAL LOW (ref >60)
GLUCOSE: 95 mg/dL (ref 65–99)
POTASSIUM: 3.6 mmol/L (ref 3.5–5.1)
SODIUM: 136 mmol/L (ref 135–145)
Total Bilirubin: 1 mg/dL (ref 0.3–1.2)
Total Protein: 6.7 g/dL (ref 6.5–8.1)

## 2017-01-27 LAB — CBC WITH DIFFERENTIAL/PLATELET
Basophils Absolute: 0.1 10*3/uL (ref 0–0.1)
Basophils Relative: 1 %
EOS PCT: 2 %
Eosinophils Absolute: 0.2 10*3/uL (ref 0–0.7)
HEMATOCRIT: 43.3 % (ref 35.0–47.0)
Hemoglobin: 14.7 g/dL (ref 12.0–16.0)
LYMPHS PCT: 29 %
Lymphs Abs: 3.2 10*3/uL (ref 1.0–3.6)
MCH: 32.5 pg (ref 26.0–34.0)
MCHC: 34 g/dL (ref 32.0–36.0)
MCV: 95.5 fL (ref 80.0–100.0)
MONO ABS: 0.8 10*3/uL (ref 0.2–0.9)
MONOS PCT: 7 %
NEUTROS ABS: 6.7 10*3/uL — AB (ref 1.4–6.5)
Neutrophils Relative %: 61 %
Platelets: 189 10*3/uL (ref 150–440)
RBC: 4.53 MIL/uL (ref 3.80–5.20)
RDW: 13.1 % (ref 11.5–14.5)
WBC: 10.9 10*3/uL (ref 3.6–11.0)

## 2017-01-27 NOTE — Assessment & Plan Note (Addendum)
#   Breast cancer ER/PR/her 2 Neu-POSITIVE; stage I; status post adjuvant radiation/MammoSite followed by  Herceptin adjuvant chemotherapy.   # Clinically no evidence of recurrence. Continue tamoxifen.  # hot flashes-2; on effexor-stable.  # Arthritis- secondary to antiestrogen therapy. Recommend continued calcium and vitamin D plus Osteo Bi-Flex.   # Chronic back pain- lumbar disc arthrtis-Dr.Chasnis.   # BMD- osteopenia- -1.3; Discussed the potential risk factors for osteoporosis- age/gender/postmenopausal status/use of anti-estrogen treatments. Discussed multiple options including exercise/ calcium and vitamin D supplementation/ and also use of bisphosphonates. Discussed oral bisphosphonates versus parenteral bisphosphonate like Reclast/ RANK ligand inhibitors- desnosumab. Discussed the potential benefits and/side effects.  Monitor for now.  # CKD- stage III- creat 1.17/stable.   # follow up in 6 months/labs/.

## 2017-01-27 NOTE — Progress Notes (Signed)
[place] OFFICE PROGRESS NOTE  Patient Care Team: [name], MD as PCP - General (Family Medicine) [name], MD as Referring Physician (Family Medicine) Bary Castilla Forest Gleason, MD (General Surgery)  Cancer Staging No matching staging information was found for the patient.    Oncology History   1.  Carcinoma of right breast.  Status post lumpectomy and sentinel lymph node evaluation.  Tumor size 1 cm.  Estrogen and progesterone receptor positive.  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  Diagnosis in April of 2016 2.  Accelerated partial breast radiation in June of 2016 3.  Weekly Taxol and Herceptin therapy for adjuvant treatment starting from July of 2016; Finished herceptin July 2017   # 5.Patient has been taken off letrozole because of bony pain and started on tamoxifen 20 mg by mouth daily from May 12, 2015; Tamoxifen [Hx of TAH]      Carcinoma of overlapping sites of right breast in female, estrogen receptor positive ([place])       INTERVAL HISTORY:  Maria Gutierrez 76 y.o.  female pleasant patient above history of Breast cancer currently on antihormone therapy is here for follow-up.   Patient continues to have chronic back pain she has followed up with pain physician Dr. Sharlet Salina.  Denies any headaches. Denies any vision changes. No nausea no vomiting. No bone pain.  REVIEW OF SYSTEMS:  A complete 10 point review of system is done which is negative except mentioned above/history of present illness.   PAST MEDICAL HISTORY :  Past Medical History:  Diagnosis Date  . Anxiety   . Arthritis   . Breast cancer of upper-outer quadrant of right female breast ([place]) 06/10/2014   T1b,Nx; ER 90%, PR 50-90%, HER-2/neu 2+, FISH positive.  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 GERD (gastroesophageal reflux disease)   . Heart murmur   . Hyperlipidemia   . Hypertension   . SOB (shortness of breath) 12/03/2014    PAST SURGICAL HISTORY :   Past Surgical  History:  Procedure Laterality Date  . ABDOMINAL HYSTERECTOMY    . AXILLARY LYMPH NODE BIOPSY Right 07/14/2014   Procedure: AXILLARY LYMPH NODE BIOPSY;  Surgeon: Robert Bellow, MD;  Location: [facility] ORS;  Service: General;  Laterality: Right;  . BREAST BIOPSY Left 2015  . BREAST EXCISIONAL BIOPSY Right 06/10/2014   DCIS rad and chemo  . BREAST LUMPECTOMY Right   . BREAST SURGERY Right 06/10/14   Wide excision for intermediate grade DCIS, identification of a 10  millimeter area of invasive mammary carcinoma.  . CHOLECYSTECTOMY  2012  . COLONOSCOPY  2009  . EYE SURGERY Bilateral 2014  . JOINT REPLACEMENT Left 2001  . JOINT REPLACEMENT Right 2003  . JOINT REPLACEMENT Left 2004   replacement joint broken  . kneee surgery Left   . PORT-A-CATH REMOVAL  02/11/2016   Dr Bary Castilla  . PORTACATH PLACEMENT Left 08/27/2014   Procedure: INSERTION PORT-A-CATH;  Surgeon: Robert Bellow, MD;  Location: [facility] ORS;  Service: General;  Laterality: Left;    FAMILY HISTORY :  No family history on file.  SOCIAL HISTORY:   Social History   Tobacco Use  . Smoking status: Never Smoker  . Smokeless tobacco: Never Used  Substance Use Topics  . Alcohol use: No    Alcohol/week: 0.0 oz  . Drug use: No    ALLERGIES:  is allergic to tape.  MEDICATIONS:  Current Outpatient Medications  Medication Sig Dispense Refill  . aspirin EC  81 MG tablet Take 81 mg by mouth daily.    . calcium carbonate (OS-CAL) 600 MG TABS tablet Take 600 mg by mouth daily.    . cyclobenzaprine (FLEXERIL) 10 MG tablet 1/2-1 po qHS prn    . fluticasone (FLONASE) 50 MCG/ACT nasal spray Place 1 spray into both nostrils daily. 16 g 2  . Glucosamine-Chondroit-Vit C-Mn (GLUCOSAMINE 1500 COMPLEX PO) Take 1 tablet by mouth daily.     . hydrochlorothiazide (HYDRODIURIL) 25 MG tablet Take 25 mg by mouth daily.     . lansoprazole (PREVACID) 15 MG capsule Take 15 mg by mouth every morning.     . meloxicam (MOBIC) 15 MG tablet Take 1 tablet  (15 mg total) by mouth daily as needed for pain. 30 tablet 3  . metoprolol succinate (TOPROL-XL) 50 MG 24 hr tablet Take 50 mg by mouth every morning. Take with or immediately following a meal.    . Multiple Vitamins-Minerals (MULTIVITAMIN) tablet Take 1 tablet by mouth daily.    . tamoxifen (NOLVADEX) 20 MG tablet TAKE 1 TABLET BY MOUTH  DAILY 90 tablet 1  . traMADol (ULTRAM) 50 MG tablet Take by mouth.    . venlafaxine XR (EFFEXOR-XR) 75 MG 24 hr capsule TAKE 1 CAPSULE BY MOUTH  DAILY WITH BREAKFAST 90 capsule 1  . Vitamin D, Ergocalciferol, (DRISDOL) 50000 units CAPS capsule TAKE 1 CAPSULE BY MOUTH  ONCE EVERY WEEK 12 capsule 2   No current facility-administered medications for this visit.    Facility-Administered Medications Ordered in Other Visits  Medication Dose Route Frequency Provider Last Rate Last Dose  . sodium chloride 0.9 % injection 10 mL  10 mL Intravenous PRN Forest Gleason, MD   10 mL at 01/27/15 1416  . sodium chloride flush (NS) 0.9 % injection 10 mL  10 mL Intravenous PRN Cammie Sickle, MD   10 mL at 12/10/15 1347    PHYSICAL EXAMINATION: ECOG PERFORMANCE STATUS: 0 - Asymptomatic  BP 131/84 (BP Location: Left Arm, Patient Position: Sitting)   Pulse 90   Temp 98.1 F (36.7 C) (Tympanic)   Resp 16   Wt 264 lb (119.7 kg)   BMI 46.77 kg/m   Filed Weights   01/27/17 1358  Weight: 264 lb (119.7 kg)    GENERAL: Well-nourished well-developed; Alert, no distress and comfortable.   Accompanied by her husband.  EYES: no pallor or icterus OROPHARYNX: no thrush or ulceration; good dentition  NECK: supple, no masses felt LYMPH:  no palpable lymphadenopathy in the cervical, axillary or inguinal regions LUNGS: clear to auscultation and  No wheeze or crackles HEART/CVS: regular rate & rhythm and no murmurs; No lower extremity edema ABDOMEN:abdomen soft, non-tender and normal bowel sounds Musculoskeletal:no cyanosis of digits and no clubbing  PSYCH: alert &  oriented x 3 with fluent speech NEURO: no focal motor/sensory deficits SKIN:  no rashes or significant lesions Breast Exam deferred.   LABORATORY DATA:  I have reviewed the data as listed    Component Value Date/Time   NA 136 01/27/2017 1320   NA 141 06/05/2014 0843   K 3.6 01/27/2017 1320   K 3.5 06/05/2014 0843   CL 100 (L) 01/27/2017 1320   CL 105 06/05/2014 0843   CO2 26 01/27/2017 1320   CO2 27 06/05/2014 0843   GLUCOSE 95 01/27/2017 1320   GLUCOSE 94 06/05/2014 0843   BUN 28 (H) 01/27/2017 1320   BUN 22 (H) 06/05/2014 0843   CREATININE 1.17 (H) 01/27/2017 1320  CREATININE 0.97 06/05/2014 0843   CALCIUM 9.0 01/27/2017 1320   CALCIUM 9.2 06/05/2014 0843   PROT 6.7 01/27/2017 1320   ALBUMIN 3.7 01/27/2017 1320   AST 22 01/27/2017 1320   ALT 18 01/27/2017 1320   ALKPHOS 56 01/27/2017 1320   BILITOT 1.0 01/27/2017 1320   GFRNONAA 44 (L) 01/27/2017 1320   GFRNONAA 58 (L) 06/05/2014 0843   GFRAA 51 (L) 01/27/2017 1320   GFRAA >60 06/05/2014 0843    No results found for: SPEP, UPEP  Lab Results  Component Value Date   WBC 10.9 01/27/2017   NEUTROABS 6.7 (H) 01/27/2017   HGB 14.7 01/27/2017   HCT 43.3 01/27/2017   MCV 95.5 01/27/2017   PLT 189 01/27/2017      Chemistry      Component Value Date/Time   NA 136 01/27/2017 1320   NA 141 06/05/2014 0843   K 3.6 01/27/2017 1320   K 3.5 06/05/2014 0843   CL 100 (L) 01/27/2017 1320   CL 105 06/05/2014 0843   CO2 26 01/27/2017 1320   CO2 27 06/05/2014 0843   BUN 28 (H) 01/27/2017 1320   BUN 22 (H) 06/05/2014 0843   CREATININE 1.17 (H) 01/27/2017 1320   CREATININE 0.97 06/05/2014 0843      Component Value Date/Time   CALCIUM 9.0 01/27/2017 1320   CALCIUM 9.2 06/05/2014 0843   ALKPHOS 56 01/27/2017 1320   AST 22 01/27/2017 1320   ALT 18 01/27/2017 1320   BILITOT 1.0 01/27/2017 1320     IMPRESSION: 1. Scoliosis of the lumbar spine is convex to the left at L2. 2. Remote inferior endplate fracture and  collapse at T12. 3. Bilateral benign-appearing renal cysts. The left-sided cyst is incompletely visualized. 4. Mild foraminal narrowing bilaterally at L2-3 is worse on the right. 5. Mild left subarticular narrowing at L3-4. Moderate foraminal stenosis at L3-4 is worse on the right. 6. Moderate left and mild right subarticular and foraminal stenosis at L4-5. This is the most significant level of stenosis. 7. Mild left subarticular and foraminal narrowing at L5-S1.   Electronically Signed   By: San Morelle M.D.   On: 01/01/2017 19:01  RADIOGRAPHIC STUDIES: I have personally reviewed the radiological images as listed and agreed with the findings in the report. No results found.   ASSESSMENT & PLAN:  Carcinoma of overlapping sites of right breast in female, estrogen receptor positive ([place]) # Breast cancer ER/PR/her 2 Neu-POSITIVE; stage I; status post adjuvant radiation/MammoSite followed by  Herceptin adjuvant chemotherapy.   # Clinically no evidence of recurrence. Continue tamoxifen.  # hot flashes-2; on effexor-stable.  # Arthritis- secondary to antiestrogen therapy. Recommend continued calcium and vitamin D plus Osteo Bi-Flex.   # Chronic back pain- lumbar disc arthrtis-Dr.Chasnis.   # BMD- osteopenia- -1.3; Discussed the potential risk factors for osteoporosis- age/gender/postmenopausal status/use of anti-estrogen treatments. Discussed multiple options including exercise/ calcium and vitamin D supplementation/ and also use of bisphosphonates. Discussed oral bisphosphonates versus parenteral bisphosphonate like Reclast/ RANK ligand inhibitors- desnosumab. Discussed the potential benefits and/side effects.  Monitor for now.  # CKD- stage III- creat 1.17/stable.   # follow up in 6 months/labs/.    No orders of the defined types were placed in this encounter.  All questions were answered. The patient knows to call the clinic with any problems, questions or concerns.       Cammie Sickle, MD 02/02/2017 4:55 PM

## 2017-04-03 ENCOUNTER — Other Ambulatory Visit: Payer: Self-pay | Admitting: Internal Medicine

## 2017-05-18 ENCOUNTER — Other Ambulatory Visit: Payer: Self-pay

## 2017-05-18 DIAGNOSIS — Z170 Estrogen receptor positive status [ER+]: Principal | ICD-10-CM

## 2017-05-18 DIAGNOSIS — C50811 Malignant neoplasm of overlapping sites of right female breast: Secondary | ICD-10-CM

## 2017-06-28 ENCOUNTER — Telehealth: Payer: Self-pay | Admitting: *Deleted

## 2017-06-28 ENCOUNTER — Ambulatory Visit (INDEPENDENT_AMBULATORY_CARE_PROVIDER_SITE_OTHER): Payer: Medicare Other | Admitting: General Surgery

## 2017-06-28 VITALS — BP 118/64 | HR 98 | Temp 98.9°F | Resp 16 | Ht 62.0 in | Wt 276.0 lb

## 2017-06-28 DIAGNOSIS — D213 Benign neoplasm of connective and other soft tissue of thorax: Secondary | ICD-10-CM

## 2017-06-28 DIAGNOSIS — N649 Disorder of breast, unspecified: Secondary | ICD-10-CM

## 2017-06-28 HISTORY — PX: BREAST EXCISIONAL BIOPSY: SUR124

## 2017-06-28 NOTE — Telephone Encounter (Signed)
Patient called and stated that this morning when she woke up she noticed her right breast nipple was red and looks infected. She stated that it is sore when she touches it. She has had no fever.

## 2017-06-28 NOTE — Patient Instructions (Signed)
May shower May use ice pack as needed for comfort May remove dressing in 2-3 days.  Return for suture removal

## 2017-06-28 NOTE — Telephone Encounter (Signed)
Nurse appt today at 3:45, pt agrees.

## 2017-06-28 NOTE — Progress Notes (Signed)
Patient ID: Maria Gutierrez, female   DOB: 06-28-1940, 77 y.o.   MRN: [id number]  Chief Complaint  Patient presents with  . Breast Problem    HPI Maria Gutierrez is a 77 y.o. female.  stated that this morning when she woke up she noticed her right breast nipple was red and looks infected. She stated that it is sore when she touches it. She has had no fever.   HPI  Past Medical History:  Diagnosis Date  . Anxiety   . Arthritis   . Breast cancer of upper-outer quadrant of right female breast ([place]) 06/10/2014   T1b,Nx; ER 90%, PR 50-90%, HER-2/neu 2+, FISH positive.  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 GERD (gastroesophageal reflux disease)   . Heart murmur   . Hyperlipidemia   . Hypertension   . SOB (shortness of breath) 12/03/2014    Past Surgical History:  Procedure Laterality Date  . ABDOMINAL HYSTERECTOMY    . AXILLARY LYMPH NODE BIOPSY Right 07/14/2014   Procedure: AXILLARY LYMPH NODE BIOPSY;  Surgeon: Robert Bellow, MD;  Location: [facility] ORS;  Service: General;  Laterality: Right;  . BREAST BIOPSY Left 2015  . BREAST EXCISIONAL BIOPSY Right 06/10/2014   DCIS rad and chemo  . BREAST LUMPECTOMY Right   . BREAST SURGERY Right 06/10/14   Wide excision for intermediate grade DCIS, identification of a 10  millimeter area of invasive mammary carcinoma.  . CHOLECYSTECTOMY  2012  . COLONOSCOPY  2009  . EYE SURGERY Bilateral 2014  . JOINT REPLACEMENT Left 2001  . JOINT REPLACEMENT Right 2003  . JOINT REPLACEMENT Left 2004   replacement joint broken  . kneee surgery Left   . PORT-A-CATH REMOVAL  02/11/2016   Dr Bary Castilla  . PORTACATH PLACEMENT Left 08/27/2014   Procedure: INSERTION PORT-A-CATH;  Surgeon: Robert Bellow, MD;  Location: [facility] ORS;  Service: General;  Laterality: Left;    No family history on file.  Social History Social History   Tobacco Use  . Smoking status: Never Smoker  . Smokeless tobacco: Never Used  Substance Use Topics  . Alcohol  use: No    Alcohol/week: 0.0 oz  . Drug use: No    Allergies  Allergen Reactions  . Tape Other (See Comments)    Blisters on skin from tape during recent surgery.    Current Outpatient Medications  Medication Sig Dispense Refill  . aspirin EC 81 MG tablet Take 81 mg by mouth daily.    . calcium carbonate (OS-CAL) 600 MG TABS tablet Take 600 mg by mouth daily.    . cyclobenzaprine (FLEXERIL) 10 MG tablet 1/2-1 po qHS prn    . fluticasone (FLONASE) 50 MCG/ACT nasal spray Place 1 spray into both nostrils daily. 16 g 2  . Glucosamine-Chondroit-Vit C-Mn (GLUCOSAMINE 1500 COMPLEX PO) Take 1 tablet by mouth daily.     . hydrochlorothiazide (HYDRODIURIL) 25 MG tablet Take 25 mg by mouth daily.     . lansoprazole (PREVACID) 15 MG capsule Take 15 mg by mouth every morning.     . meloxicam (MOBIC) 15 MG tablet Take 1 tablet (15 mg total) by mouth daily as needed for pain. 30 tablet 3  . metoprolol succinate (TOPROL-XL) 50 MG 24 hr tablet Take 50 mg by mouth every morning. Take with or immediately following a meal.    . tamoxifen (NOLVADEX) 20 MG tablet TAKE 1 TABLET BY MOUTH  DAILY 90 tablet 1  . tamoxifen (NOLVADEX)  20 MG tablet TAKE 1 TABLET BY MOUTH  DAILY 90 tablet 1  . traMADol (ULTRAM) 50 MG tablet Take by mouth.    . venlafaxine XR (EFFEXOR-XR) 75 MG 24 hr capsule TAKE 1 CAPSULE BY MOUTH  DAILY WITH BREAKFAST 90 capsule 1  . Vitamin D, Ergocalciferol, (DRISDOL) 50000 units CAPS capsule TAKE 1 CAPSULE BY MOUTH  ONCE EVERY WEEK 12 capsule 2   No current facility-administered medications for this visit.    Facility-Administered Medications Ordered in Other Visits  Medication Dose Route Frequency Provider Last Rate Last Dose  . sodium chloride 0.9 % injection 10 mL  10 mL Intravenous PRN Forest Gleason, MD   10 mL at 01/27/15 1416  . sodium chloride flush (NS) 0.9 % injection 10 mL  10 mL Intravenous PRN Cammie Sickle, MD   10 mL at 12/10/15 1347    Review of Systems Review of  Systems  Constitutional: Negative.   Respiratory: Negative.   Cardiovascular: Negative.     Blood pressure 118/64, pulse 98, temperature 98.9 F (37.2 C), temperature source Oral, resp. rate 16, height _0  (1.575 m), weight 276 lb (125.2 kg).  Physical Exam Physical Exam  Constitutional: She is oriented to person, place, and time. She appears well-developed and well-nourished.  HENT:  Mouth/Throat: Oropharynx is clear and moist.  Eyes: No scleral icterus.  Neck: Normal range of motion. Neck supple.  Cardiovascular: Normal rate, regular rhythm and normal heart sounds.  Pulmonary/Chest: Effort normal and breath sounds normal. Right breast exhibits no inverted nipple, no mass, no nipple discharge, no skin change and no tenderness. Left breast exhibits no inverted nipple, no mass, no nipple discharge, no skin change and no tenderness.    Lymphadenopathy:    She has no cervical adenopathy.    She has no axillary adenopathy.       Right: No supraclavicular adenopathy present.       Left: No supraclavicular adenopathy present.  Neurological: She is alert and oriented to person, place, and time.  Skin: Skin is warm and dry.  Psychiatric: Her behavior is normal.    Data Reviewed The patient was amenable to excisional biopsy.  10 cc of 0.5% Xylocaine with 0.25% Marcaine, plain  was utilized and well-tolerated.  The area was cleansed with ChloraPrep and draped.  The lesion was excised and sent in formalin for routine histology.  The skin defect was closed with interrupted 5-0 Prolene simple sutures.  Bacitracin followed by Telfa and Tegaderm dressing applied.  The procedure was well-tolerated.  Assessment    Abnormality of the right nipple.    Plan    Suture removal in one week.  The patient will be contacted when pathology is available.     HPI, Physical Exam, Assessment and Plan have been scribed under the direction and in the presence of Robert Bellow, MD. Karie Fetch,  RN  I have completed the exam and reviewed the above documentation for accuracy and completeness.  I agree with the above.  Haematologist has been used and any errors in dictation or transcription are unintentional.  Hervey Ard, M.D., F.A.C.S.  Forest Gleason Saron Vanorman 06/28/2017, 5:06 PM

## 2017-07-03 ENCOUNTER — Telehealth: Payer: Self-pay | Admitting: *Deleted

## 2017-07-03 ENCOUNTER — Telehealth: Payer: Self-pay | Admitting: General Surgery

## 2017-07-03 NOTE — Telephone Encounter (Signed)
Patient called and wants to know results from her biopsy that was done on 06/28/17

## 2017-07-03 NOTE — Telephone Encounter (Signed)
The patient was notified that the pathology results were benign.  She will return as planned for suture removal.

## 2017-07-05 ENCOUNTER — Ambulatory Visit (INDEPENDENT_AMBULATORY_CARE_PROVIDER_SITE_OTHER): Payer: Medicare Other | Admitting: *Deleted

## 2017-07-05 DIAGNOSIS — C50811 Malignant neoplasm of overlapping sites of right female breast: Secondary | ICD-10-CM

## 2017-07-05 DIAGNOSIS — N649 Disorder of breast, unspecified: Secondary | ICD-10-CM

## 2017-07-05 DIAGNOSIS — Z170 Estrogen receptor positive status [ER+]: Secondary | ICD-10-CM

## 2017-07-05 NOTE — Patient Instructions (Signed)
The patient is aware to call back for any questions or concerns.  

## 2017-07-05 NOTE — Progress Notes (Signed)
Patient ID: Maria Gutierrez, female   DOB: [birth year], 77 y.o.   MRN: [id number]   Patient came in today for a wound check post nipple lesion biopsy.  The wound is clean, with no signs of infection noted. Sutures removed, instructed to apply  neosporin daily as needed. Follow up as scheduled. Aware of pathology per Dr Bary Castilla.

## 2017-07-25 ENCOUNTER — Ambulatory Visit
Admission: RE | Admit: 2017-07-25 | Discharge: 2017-07-25 | Disposition: A | Discharge status: Home / Self Care | Payer: Medicare Other | Source: Ambulatory Visit | Attending: General Surgery | Admitting: General Surgery

## 2017-07-25 DIAGNOSIS — Z170 Estrogen receptor positive status [ER+]: Principal | ICD-10-CM

## 2017-07-25 DIAGNOSIS — C50811 Malignant neoplasm of overlapping sites of right female breast: Secondary | ICD-10-CM

## 2017-07-25 HISTORY — DX: Personal history of irradiation: Z92.3

## 2017-07-25 HISTORY — DX: Personal history of antineoplastic chemotherapy: Z92.21

## 2017-07-28 ENCOUNTER — Ambulatory Visit: Payer: Medicare Other | Admitting: Internal Medicine

## 2017-07-31 ENCOUNTER — Inpatient Hospital Stay: Payer: Medicare Other | Attending: Internal Medicine | Admitting: Internal Medicine

## 2017-07-31 ENCOUNTER — Other Ambulatory Visit: Payer: Self-pay

## 2017-07-31 ENCOUNTER — Encounter: Payer: Self-pay | Admitting: Internal Medicine

## 2017-07-31 ENCOUNTER — Other Ambulatory Visit: Payer: Self-pay | Admitting: Internal Medicine

## 2017-07-31 VITALS — BP 133/86 | HR 97 | Temp 97.6°F | Resp 16 | Ht 62.0 in | Wt 275.0 lb

## 2017-07-31 DIAGNOSIS — N183 Chronic kidney disease, stage 3 (moderate): Secondary | ICD-10-CM

## 2017-07-31 DIAGNOSIS — Z79810 Long term (current) use of selective estrogen receptor modulators (SERMs): Secondary | ICD-10-CM | POA: Insufficient documentation

## 2017-07-31 DIAGNOSIS — I129 Hypertensive chronic kidney disease with stage 1 through stage 4 chronic kidney disease, or unspecified chronic kidney disease: Secondary | ICD-10-CM | POA: Diagnosis not present

## 2017-07-31 DIAGNOSIS — C50811 Malignant neoplasm of overlapping sites of right female breast: Secondary | ICD-10-CM | POA: Insufficient documentation

## 2017-07-31 DIAGNOSIS — Z170 Estrogen receptor positive status [ER+]: Secondary | ICD-10-CM | POA: Insufficient documentation

## 2017-07-31 NOTE — Progress Notes (Signed)
[facility] OFFICE PROGRESS NOTE  Patient Care Team: [name], MD as PCP - General (Family Medicine) [name], MD as Referring Physician (Family Medicine) Bary Castilla Forest Gleason, MD (General Surgery)  Cancer Staging No matching staging information was found for the patient.   Oncology History   1.  Carcinoma of right breast.  Status post lumpectomy and sentinel lymph node evaluation.  Tumor size 1 cm.  Estrogen and progesterone receptor positive.  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  Diagnosis in April of 2016 2.  Accelerated partial breast radiation in June of 2016 3.  Weekly Taxol and Herceptin therapy for adjuvant treatment starting from July of 2016; Finished herceptin July 2017   # 5.Patient has been taken off letrozole because of bony pain and started on tamoxifen 20 mg by mouth daily from May 12, 2015; Tamoxifen [Hx of TAH]   DIAGNOSIS: BREAST CA ER/PR/her 2 NEU POSITIVE  STAGE:  I ;GOALS: cure  CURRENT/MOST RECENT THERAPY- [  Tamoxifen]      Carcinoma of overlapping sites of right breast in female, estrogen receptor positive ([place])      INTERVAL HISTORY:  Maria Gutierrez 77 y.o.  female pleasant patient above history of stage I breast cancer ER PR positive HER-2/neu positive is here for follow-up.  Patient denies any nausea vomiting headache.  Denies any new lumps or bumps.  Patient had a recent biopsy of a skin lesion on the right breast.-Back negative for malignancy.  Review of Systems  Constitutional: Negative for chills, diaphoresis, fever, malaise/fatigue and weight loss.  HENT: Negative for nosebleeds and sore throat.   Eyes: Negative for double vision.  Respiratory: Negative for cough, hemoptysis, sputum production, shortness of breath and wheezing.   Cardiovascular: Negative for chest pain, palpitations, orthopnea and leg swelling.  Gastrointestinal: Negative for abdominal pain, blood in stool, constipation, diarrhea, heartburn, melena, nausea  and vomiting.  Genitourinary: Negative for dysuria, frequency and urgency.  Musculoskeletal: Positive for back pain and joint pain.  Skin: Negative.  Negative for itching and rash.  Neurological: Negative for dizziness, tingling, focal weakness, weakness and headaches.  Endo/Heme/Allergies: Does not bruise/bleed easily.  Psychiatric/Behavioral: Negative for depression. The patient is not nervous/anxious and does not have insomnia.       PAST MEDICAL HISTORY :  Past Medical History:  Diagnosis Date  . Anxiety   . Arthritis   . Breast cancer of upper-outer quadrant of right female breast ([place]) 06/10/2014   T1b,Nx; ER 90%, PR 50-90%, HER-2/neu 2+, FISH positive.  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 GERD (gastroesophageal reflux disease)   . Heart murmur   . Hyperlipidemia   . Hypertension   . Personal history of chemotherapy 2016   right breast ca  . Personal history of radiation therapy 2016   mammosite  . SOB (shortness of breath) 12/03/2014    PAST SURGICAL HISTORY :   Past Surgical History:  Procedure Laterality Date  . ABDOMINAL HYSTERECTOMY    . AXILLARY LYMPH NODE BIOPSY Right 07/14/2014   Procedure: AXILLARY LYMPH NODE BIOPSY;  Surgeon: Robert Bellow, MD;  Location: [facility] ORS;  Service: General;  Laterality: Right;  . BREAST BIOPSY Left 2012   benign  . BREAST BIOPSY Right 05/26/2014   DCIS   . BREAST EXCISIONAL BIOPSY Right 06/28/2017    ULCERATED SKIN WITH UNDERLYING FAT NECROSIS, ACUTE INFLAMMATION AND REACTIVE EPITHELIAL ATYPIA  . BREAST LUMPECTOMY Right 06/10/2014   DCIS and Invasive ductal carcinoma, clear margins  .  BREAST SURGERY Right 06/10/14   Wide excision for intermediate grade DCIS, identification of a 10  millimeter area of invasive mammary carcinoma.  . CHOLECYSTECTOMY  2012  . COLONOSCOPY  2009  . EYE SURGERY Bilateral 2014  . JOINT REPLACEMENT Left 2001  . JOINT REPLACEMENT Right 2003  . JOINT REPLACEMENT Left 2004    replacement joint broken  . kneee surgery Left   . PORT-A-CATH REMOVAL  02/11/2016   Dr Bary Castilla  . PORTACATH PLACEMENT Left 08/27/2014   Procedure: INSERTION PORT-A-CATH;  Surgeon: Robert Bellow, MD;  Location: [facility] ORS;  Service: General;  Laterality: Left;    FAMILY HISTORY :  History reviewed. No pertinent family history.  SOCIAL HISTORY:   Social History   Tobacco Use  . Smoking status: Never Smoker  . Smokeless tobacco: Never Used  Substance Use Topics  . Alcohol use: No    Alcohol/week: 0.0 oz  . Drug use: No    ALLERGIES:  is allergic to tape.  MEDICATIONS:  Current Outpatient Medications  Medication Sig Dispense Refill  . aspirin EC 81 MG tablet Take 81 mg by mouth daily.    . calcium carbonate (OS-CAL) 600 MG TABS tablet Take 600 mg by mouth daily.    . cyclobenzaprine (FLEXERIL) 10 MG tablet 1/2-1 po qHS prn    . fluticasone (FLONASE) 50 MCG/ACT nasal spray Place 1 spray into both nostrils daily. 16 g 2  . Glucosamine-Chondroit-Vit C-Mn (GLUCOSAMINE 1500 COMPLEX PO) Take 1 tablet by mouth daily.     . hydrochlorothiazide (HYDRODIURIL) 25 MG tablet Take 25 mg by mouth daily.     . lansoprazole (PREVACID) 15 MG capsule Take 15 mg by mouth every morning.     . meloxicam (MOBIC) 15 MG tablet Take 1 tablet (15 mg total) by mouth daily as needed for pain. 30 tablet 3  . metoprolol succinate (TOPROL-XL) 50 MG 24 hr tablet Take 50 mg by mouth every morning. Take with or immediately following a meal.    . tamoxifen (NOLVADEX) 20 MG tablet TAKE 1 TABLET BY MOUTH  DAILY 90 tablet 1  . tamoxifen (NOLVADEX) 20 MG tablet TAKE 1 TABLET BY MOUTH  DAILY 90 tablet 1  . traMADol (ULTRAM) 50 MG tablet Take by mouth.    . venlafaxine XR (EFFEXOR-XR) 75 MG 24 hr capsule TAKE 1 CAPSULE BY MOUTH  DAILY WITH BREAKFAST 90 capsule 1  . Vitamin D, Ergocalciferol, (DRISDOL) 50000 units CAPS capsule TAKE 1 CAPSULE BY MOUTH  ONCE EVERY WEEK 10 capsule 2   No current facility-administered  medications for this visit.    Facility-Administered Medications Ordered in Other Visits  Medication Dose Route Frequency Provider Last Rate Last Dose  . sodium chloride 0.9 % injection 10 mL  10 mL Intravenous PRN Forest Gleason, MD   10 mL at 01/27/15 1416  . sodium chloride flush (NS) 0.9 % injection 10 mL  10 mL Intravenous PRN Cammie Sickle, MD   10 mL at 12/10/15 1347    PHYSICAL EXAMINATION: ECOG PERFORMANCE STATUS: 0 - Asymptomatic  BP 133/86 (BP Location: Left Arm, Patient Position: Sitting)   Pulse 97   Temp 97.6 F (36.4 C) (Tympanic)   Resp 16   Ht _0  (1.575 m)   Wt 275 lb (124.7 kg)   BMI 50.30 kg/m   Filed Weights   07/31/17 1345  Weight: 275 lb (124.7 kg)    GENERAL: Well-nourished well-developed; Alert, no distress and comfortable.  Obese.  Accompanied by  family. EYES: no pallor or icterus OROPHARYNX: no thrush or ulceration; NECK: supple; no lymph nodes felt. LYMPH:  no palpable lymphadenopathy in the axillary or inguinal regions LUNGS: Decreased breath sounds auscultation bilaterally. No wheeze or crackles HEART/CVS: regular rate & rhythm and no murmurs; No lower extremity edema ABDOMEN:abdomen soft, non-tender and normal bowel sounds. No hepatomegaly or splenomegaly.  Musculoskeletal:no cyanosis of digits and no clubbing  PSYCH: alert & oriented x 3 with fluent speech NEURO: no focal motor/sensory deficits SKIN:  no rashes or significant lesions    LABORATORY DATA:  I have reviewed the data as listed    Component Value Date/Time   NA 136 01/27/2017 1320   NA 141 06/05/2014 0843   K 3.6 01/27/2017 1320   K 3.5 06/05/2014 0843   CL 100 (L) 01/27/2017 1320   CL 105 06/05/2014 0843   CO2 26 01/27/2017 1320   CO2 27 06/05/2014 0843   GLUCOSE 95 01/27/2017 1320   GLUCOSE 94 06/05/2014 0843   BUN 28 (H) 01/27/2017 1320   BUN 22 (H) 06/05/2014 0843   CREATININE 1.17 (H) 01/27/2017 1320   CREATININE 0.97 06/05/2014 0843   CALCIUM 9.0  01/27/2017 1320   CALCIUM 9.2 06/05/2014 0843   PROT 6.7 01/27/2017 1320   ALBUMIN 3.7 01/27/2017 1320   AST 22 01/27/2017 1320   ALT 18 01/27/2017 1320   ALKPHOS 56 01/27/2017 1320   BILITOT 1.0 01/27/2017 1320   GFRNONAA 44 (L) 01/27/2017 1320   GFRNONAA 58 (L) 06/05/2014 0843   GFRAA 51 (L) 01/27/2017 1320   GFRAA >60 06/05/2014 0843    No results found for: SPEP, UPEP  Lab Results  Component Value Date   WBC 10.9 01/27/2017   NEUTROABS 6.7 (H) 01/27/2017   HGB 14.7 01/27/2017   HCT 43.3 01/27/2017   MCV 95.5 01/27/2017   PLT 189 01/27/2017      Chemistry      Component Value Date/Time   NA 136 01/27/2017 1320   NA 141 06/05/2014 0843   K 3.6 01/27/2017 1320   K 3.5 06/05/2014 0843   CL 100 (L) 01/27/2017 1320   CL 105 06/05/2014 0843   CO2 26 01/27/2017 1320   CO2 27 06/05/2014 0843   BUN 28 (H) 01/27/2017 1320   BUN 22 (H) 06/05/2014 0843   CREATININE 1.17 (H) 01/27/2017 1320   CREATININE 0.97 06/05/2014 0843      Component Value Date/Time   CALCIUM 9.0 01/27/2017 1320   CALCIUM 9.2 06/05/2014 0843   ALKPHOS 56 01/27/2017 1320   AST 22 01/27/2017 1320   ALT 18 01/27/2017 1320   BILITOT 1.0 01/27/2017 1320       RADIOGRAPHIC STUDIES: I have personally reviewed the radiological images as listed and agreed with the findings in the report. No results found.   ASSESSMENT & PLAN:  Carcinoma of overlapping sites of right breast in female, estrogen receptor positive ([place]) # Breast cancer ER/PR/her 2 Neu-POSITIVE; stage I-on tamoxifen.  Stable.  No evidence of recurrence.  Recent mammogram May 2019 normal.  Continue tamoxifen.  #Hot flashes grade 1-2 on Effexor stable.  #Joint pains/arthritis-secondary to tamoxifen/stable.  # sep 2018- BMD- osteopenia- -1.3; stable.  Continue calcium daily.  # CKD- stage III- creat 1.17/stable.   # follow up in 6 months/labs/   Orders Placed This Encounter  Procedures  . CBC with Differential    Standing Status:    Future    Standing Expiration Date:   08/01/2018  .  Comprehensive metabolic panel    Standing Status:   Future    Standing Expiration Date:   08/01/2018   All questions were answered. The patient knows to call the clinic with any problems, questions or concerns.      Cammie Sickle, MD 07/31/2017 11:50 PM

## 2017-07-31 NOTE — Progress Notes (Signed)
Patient here for follow up. She has had a biopsy of a lesion in her right breast that was benign.

## 2017-07-31 NOTE — Assessment & Plan Note (Addendum)
#   Breast cancer ER/PR/her 2 Neu-POSITIVE; stage I-on tamoxifen.  Stable.  No evidence of recurrence.  Recent mammogram May 2019 normal.  Continue tamoxifen.  #Hot flashes grade 1-2 on Effexor stable.  #Joint pains/arthritis-secondary to tamoxifen/stable.  # sep 2018- BMD- osteopenia- -1.3; stable.  Continue calcium daily.  # CKD- stage III- creat 1.17/stable.   # follow up in 6 months/labs/

## 2017-07-31 NOTE — Telephone Encounter (Signed)
Patient has appointment today at 1:45

## 2017-08-01 ENCOUNTER — Ambulatory Visit: Payer: Medicare Other | Admitting: General Surgery

## 2017-08-01 ENCOUNTER — Encounter: Payer: Self-pay | Admitting: General Surgery

## 2017-08-01 VITALS — BP 132/78 | HR 80 | Resp 16 | Ht 62.0 in | Wt 276.0 lb

## 2017-08-01 DIAGNOSIS — C50811 Malignant neoplasm of overlapping sites of right female breast: Secondary | ICD-10-CM | POA: Diagnosis not present

## 2017-08-01 DIAGNOSIS — Z170 Estrogen receptor positive status [ER+]: Secondary | ICD-10-CM | POA: Diagnosis not present

## 2017-08-01 NOTE — Progress Notes (Signed)
Patient ID: Maria Gutierrez, female   DOB: 1940/06/18, 77 y.o.   MRN: [id number]  Chief Complaint  Patient presents with  . Follow-up    HPI Maria Gutierrez is a 77 y.o. female who presents for a breast evaluation. The most recent mammogram was done on 07/25/2017.  Patient does perform regular self breast checks and gets regular mammograms done.  Patient states her right has been sore off and on with touch.   HPI  Past Medical History:  Diagnosis Date  . Anxiety   . Arthritis   . Breast cancer of upper-outer quadrant of right female breast ([place]) 06/10/2014   T1b,Nx; ER 90%, PR 50-90%, HER-2/neu 2+, FISH positive.  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 GERD (gastroesophageal reflux disease)   . Heart murmur   . Hyperlipidemia   . Hypertension   . Personal history of chemotherapy 2016   right breast ca  . Personal history of radiation therapy 2016   mammosite  . SOB (shortness of breath) 12/03/2014    Past Surgical History:  Procedure Laterality Date  . ABDOMINAL HYSTERECTOMY    . AXILLARY LYMPH NODE BIOPSY Right 07/14/2014   Procedure: AXILLARY LYMPH NODE BIOPSY;  Surgeon: Robert Bellow, MD;  Location: [facility] ORS;  Service: General;  Laterality: Right;  . BREAST BIOPSY Left 2012   benign  . BREAST BIOPSY Right 05/26/2014   DCIS   . BREAST EXCISIONAL BIOPSY Right 06/28/2017    ULCERATED SKIN WITH UNDERLYING FAT NECROSIS, ACUTE INFLAMMATION AND REACTIVE EPITHELIAL ATYPIA  . BREAST LUMPECTOMY Right 06/10/2014   DCIS and Invasive ductal carcinoma, clear margins  . BREAST SURGERY Right 06/10/14   Wide excision for intermediate grade DCIS, identification of a 10  millimeter area of invasive mammary carcinoma.  . CHOLECYSTECTOMY  2012  . COLONOSCOPY  2009  . EYE SURGERY Bilateral 2014  . JOINT REPLACEMENT Left 2001  . JOINT REPLACEMENT Right 2003  . JOINT REPLACEMENT Left 2004   replacement joint broken  . kneee surgery Left   . PORT-A-CATH REMOVAL  02/11/2016    Dr Bary Castilla  . PORTACATH PLACEMENT Left 08/27/2014   Procedure: INSERTION PORT-A-CATH;  Surgeon: Robert Bellow, MD;  Location: [facility] ORS;  Service: General;  Laterality: Left;    No family history on file.  Social History Social History   Tobacco Use  . Smoking status: Never Smoker  . Smokeless tobacco: Never Used  Substance Use Topics  . Alcohol use: No    Alcohol/week: 0.0 oz  . Drug use: No    Allergies  Allergen Reactions  . Tape Other (See Comments)    Blisters on skin from tape during recent surgery.    Current Outpatient Medications  Medication Sig Dispense Refill  . aspirin EC 81 MG tablet Take 81 mg by mouth daily.    . calcium carbonate (OS-CAL) 600 MG TABS tablet Take 600 mg by mouth daily.    . cyclobenzaprine (FLEXERIL) 10 MG tablet 1/2-1 po qHS prn    . fluticasone (FLONASE) 50 MCG/ACT nasal spray Place 1 spray into both nostrils daily. 16 g 2  . Glucosamine-Chondroit-Vit C-Mn (GLUCOSAMINE 1500 COMPLEX PO) Take 1 tablet by mouth daily.     . hydrochlorothiazide (HYDRODIURIL) 25 MG tablet Take 25 mg by mouth daily.     . lansoprazole (PREVACID) 15 MG capsule Take 15 mg by mouth every morning.     . meloxicam (MOBIC) 15 MG tablet Take 1 tablet (15 mg  total) by mouth daily as needed for pain. 30 tablet 3  . metoprolol succinate (TOPROL-XL) 50 MG 24 hr tablet Take 50 mg by mouth every morning. Take with or immediately following a meal.    . tamoxifen (NOLVADEX) 20 MG tablet TAKE 1 TABLET BY MOUTH  DAILY 90 tablet 1  . traMADol (ULTRAM) 50 MG tablet Take by mouth.    . venlafaxine XR (EFFEXOR-XR) 75 MG 24 hr capsule TAKE 1 CAPSULE BY MOUTH  DAILY WITH BREAKFAST 90 capsule 1  . Vitamin D, Ergocalciferol, (DRISDOL) 50000 units CAPS capsule TAKE 1 CAPSULE BY MOUTH  ONCE EVERY WEEK 10 capsule 2   No current facility-administered medications for this visit.    Facility-Administered Medications Ordered in Other Visits  Medication Dose Route Frequency Provider Last  Rate Last Dose  . sodium chloride 0.9 % injection 10 mL  10 mL Intravenous PRN Forest Gleason, MD   10 mL at 01/27/15 1416  . sodium chloride flush (NS) 0.9 % injection 10 mL  10 mL Intravenous PRN Cammie Sickle, MD   10 mL at 12/10/15 1347    Review of Systems Review of Systems  Constitutional: Negative.   Respiratory: Negative.   Cardiovascular: Negative.     Blood pressure 132/78, pulse 80, resp. rate 16, height _0  (1.575 m), weight 276 lb (125.2 kg).  Physical Exam Physical Exam  Constitutional: She is oriented to person, place, and time. She appears well-developed and well-nourished.  Eyes: Conjunctivae are normal. No scleral icterus.  Neck: Neck supple.  Cardiovascular: Normal rate, regular rhythm and normal heart sounds.  Pulmonary/Chest: Effort normal and breath sounds normal. Right breast exhibits no inverted nipple, no mass, no nipple discharge, no skin change and no tenderness. Left breast exhibits no inverted nipple, no mass, no nipple discharge, no skin change and no tenderness.    Lymphadenopathy:    She has no cervical adenopathy.    She has no axillary adenopathy.  Neurological: She is alert and oriented to person, place, and time.  Skin: Skin is warm and dry.    Data Reviewed Bilateral diagnostic mammograms dated 8/28, 2019 reviewed.  Postsurgical changes.  BI-RADS-2.  Assessment    Benign breast exam.    Plan  The patient reports that she has had joint aches, and she is attributed this to tamoxifen.  She previously failed a trial of 2 aromatase inhibitors.  At this time, she needs to stay on the tamoxifen.  The patient has been asked to return to the office in one year with a bilateral diagnostic mammogram. The patient is aware to call back for any questions or concerns.   HPI, Physical Exam, Assessment and Plan have been scribed under the direction and in the presence of Hervey Ard, MD.  Gaspar Cola, CMA  I have completed the exam  and reviewed the above documentation for accuracy and completeness.  I agree with the above.  Haematologist has been used and any errors in dictation or transcription are unintentional.  Hervey Ard, M.D., F.A.C.S.  Forest Gleason Maria Gutierrez 08/01/2017, 8:54 PM

## 2017-08-01 NOTE — Patient Instructions (Signed)
The patient has been asked to return to the office in one year with a bilateral diagnostic mammogram.The patient is aware to call back for any questions or concerns. 

## 2017-11-06 ENCOUNTER — Other Ambulatory Visit: Payer: Self-pay | Admitting: Internal Medicine

## 2017-11-08 ENCOUNTER — Other Ambulatory Visit: Payer: Self-pay

## 2017-11-08 ENCOUNTER — Ambulatory Visit
Admission: RE | Admit: 2017-11-08 | Discharge: 2017-11-08 | Disposition: A | Discharge status: Home / Self Care | Payer: Medicare Other | Source: Ambulatory Visit | Attending: Radiation Oncology | Admitting: Radiation Oncology

## 2017-11-08 ENCOUNTER — Encounter: Payer: Self-pay | Admitting: Radiation Oncology

## 2017-11-08 VITALS — BP 144/81 | HR 92 | Temp 97.6°F | Resp 16 | Wt 280.4 lb

## 2017-11-08 DIAGNOSIS — Z923 Personal history of irradiation: Secondary | ICD-10-CM | POA: Diagnosis not present

## 2017-11-08 DIAGNOSIS — C50811 Malignant neoplasm of overlapping sites of right female breast: Secondary | ICD-10-CM | POA: Diagnosis present

## 2017-11-08 DIAGNOSIS — Z170 Estrogen receptor positive status [ER+]: Secondary | ICD-10-CM | POA: Diagnosis not present

## 2017-11-08 DIAGNOSIS — Z79810 Long term (current) use of selective estrogen receptor modulators (SERMs): Secondary | ICD-10-CM | POA: Insufficient documentation

## 2017-11-08 NOTE — Progress Notes (Signed)
Radiation Oncology Follow up Note  Name: Maria Gutierrez   Date:   11/08/2017 MRN:  [id number] DOB: 12/21/1940    This 77 y.o. female presents to the clinic today for 3 year follow-up status post accelerated partial breast irradiation to her right breast for stage I ER/PR positive invasive mammary carcinoma.  REFERRING PROVIDER: [name], MD  HPI: patient is a 77 year old female now seen out 3 years having completed accelerated partial breast radiation to her right breast for a T1 ER/PR positive HER-2/neu positive invasive mammary carcinoma. She is seen today in routine follow-up and is doing well she specifically denies breast tenderness cough or bone pain..she's currently on tamoxifen tolerating that well without side effect.she had mammograms back in May which I have reviewed were BI-RADS 2 benign.  COMPLICATIONS OF TREATMENT: none  FOLLOW UP COMPLIANCE: keeps appointments   PHYSICAL EXAM:  BP (!) 144/81 (BP Location: Left Arm, Patient Position: Sitting)   Pulse 92   Temp 97.6 F (36.4 C) (Tympanic)   Resp 16   Wt 280 lb 6.8 oz (127.2 kg)   BMI 51.29 kg/m  Lungs are clear to A&P cardiac examination essentially unremarkable with regular rate and rhythm. No dominant mass or nodularity is noted in either breast in 2 positions examined. Incision is well-healed. No axillary or supraclavicular adenopathy is appreciated. Cosmetic result is excellent.Well-developed well-nourished patient in NAD. HEENT reveals PERLA, EOMI, discs not visualized.  Oral cavity is clear. No oral mucosal lesions are identified. Neck is clear without evidence of cervical or supraclavicular adenopathy. Lungs are clear to A&P. Cardiac examination is essentially unremarkable with regular rate and rhythm without murmur rub or thrill. Abdomen is benign with no organomegaly or masses noted. Motor sensory and DTR levels are equal and symmetric in the upper and lower extremities. Cranial nerves II through XII are grossly  intact. Proprioception is intact. No peripheral adenopathy or edema is identified. No motor or sensory levels are noted. Crude visual fields are within normal range.  RADIOLOGY RESULTS: mammograms reviewed and compatible above-stated findings  PLAN: present time patient is doing well with no evidence of disease 3 years out. I've asked to see her back in 1 year for follow-up. She continues on tamoxifen without side effect. I believe she is oriented had mammogram scheduled for next May. Patient is to call with any concerns.  I would like to take this opportunity to thank you for allowing me to participate in the care of your patient.Noreene Filbert, MD

## 2018-01-29 ENCOUNTER — Other Ambulatory Visit: Payer: Self-pay

## 2018-01-29 ENCOUNTER — Inpatient Hospital Stay: Payer: Medicare Other | Attending: Internal Medicine

## 2018-01-29 ENCOUNTER — Inpatient Hospital Stay (HOSPITAL_BASED_OUTPATIENT_CLINIC_OR_DEPARTMENT_OTHER): Payer: Medicare Other | Admitting: Internal Medicine

## 2018-01-29 ENCOUNTER — Encounter: Payer: Self-pay | Admitting: Internal Medicine

## 2018-01-29 DIAGNOSIS — I129 Hypertensive chronic kidney disease with stage 1 through stage 4 chronic kidney disease, or unspecified chronic kidney disease: Secondary | ICD-10-CM

## 2018-01-29 DIAGNOSIS — C50811 Malignant neoplasm of overlapping sites of right female breast: Secondary | ICD-10-CM

## 2018-01-29 DIAGNOSIS — M8580 Other specified disorders of bone density and structure, unspecified site: Secondary | ICD-10-CM

## 2018-01-29 DIAGNOSIS — N183 Chronic kidney disease, stage 3 (moderate): Secondary | ICD-10-CM

## 2018-01-29 DIAGNOSIS — N951 Menopausal and female climacteric states: Secondary | ICD-10-CM | POA: Diagnosis not present

## 2018-01-29 DIAGNOSIS — Z170 Estrogen receptor positive status [ER+]: Secondary | ICD-10-CM | POA: Diagnosis not present

## 2018-01-29 DIAGNOSIS — Z79810 Long term (current) use of selective estrogen receptor modulators (SERMs): Secondary | ICD-10-CM | POA: Diagnosis not present

## 2018-01-29 LAB — CBC WITH DIFFERENTIAL/PLATELET
Abs Immature Granulocytes: 0.02 10*3/uL (ref 0.00–0.07)
BASOS ABS: 0 10*3/uL (ref 0.0–0.1)
BASOS PCT: 0 %
EOS ABS: 0.1 10*3/uL (ref 0.0–0.5)
Eosinophils Relative: 2 %
HCT: 40.9 % (ref 36.0–46.0)
Hemoglobin: 13.3 g/dL (ref 12.0–15.0)
IMMATURE GRANULOCYTES: 0 %
Lymphocytes Relative: 30 %
Lymphs Abs: 2.3 10*3/uL (ref 0.7–4.0)
MCH: 30.9 pg (ref 26.0–34.0)
MCHC: 32.5 g/dL (ref 30.0–36.0)
MCV: 95.1 fL (ref 80.0–100.0)
MONOS PCT: 8 %
Monocytes Absolute: 0.6 10*3/uL (ref 0.1–1.0)
NEUTROS PCT: 60 %
NRBC: 0 % (ref 0.0–0.2)
Neutro Abs: 4.5 10*3/uL (ref 1.7–7.7)
PLATELETS: 187 10*3/uL (ref 150–400)
RBC: 4.3 MIL/uL (ref 3.87–5.11)
RDW: 12.7 % (ref 11.5–15.5)
WBC: 7.6 10*3/uL (ref 4.0–10.5)

## 2018-01-29 LAB — COMPREHENSIVE METABOLIC PANEL
ALT: 16 U/L (ref 0–44)
ANION GAP: 8 (ref 5–15)
AST: 23 U/L (ref 15–41)
Albumin: 3.7 g/dL (ref 3.5–5.0)
Alkaline Phosphatase: 42 U/L (ref 38–126)
BILIRUBIN TOTAL: 0.7 mg/dL (ref 0.3–1.2)
BUN: 21 mg/dL (ref 8–23)
CALCIUM: 9.2 mg/dL (ref 8.9–10.3)
CO2: 28 mmol/L (ref 22–32)
Chloride: 102 mmol/L (ref 98–111)
Creatinine, Ser: 1.13 mg/dL — ABNORMAL HIGH (ref 0.44–1.00)
GFR calc non Af Amer: 47 mL/min — ABNORMAL LOW (ref >60)
GFR, EST AFRICAN AMERICAN: 55 mL/min — AB (ref >60)
Glucose, Bld: 125 mg/dL — ABNORMAL HIGH (ref 70–99)
Potassium: 3.7 mmol/L (ref 3.5–5.1)
Sodium: 138 mmol/L (ref 135–145)
TOTAL PROTEIN: 6.6 g/dL (ref 6.5–8.1)

## 2018-01-29 NOTE — Progress Notes (Signed)
[name] OFFICE PROGRESS NOTE  Patient Care Team: [name], MD as PCP - General (Family Medicine) [name], MD as Referring Physician (Family Medicine) Bary Castilla Forest Gleason, MD (General Surgery)  Cancer Staging No matching staging information was found for the patient.   Oncology History   1.  Carcinoma of right breast.  Status post lumpectomy and sentinel lymph node evaluation.  Tumor size 1 cm.  Estrogen and progesterone receptor positive.  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  Diagnosis in April of 2016 2.  Accelerated partial breast radiation in June of 2016 3.  Weekly Taxol and Herceptin therapy for adjuvant treatment starting from July of 2016; Finished herceptin July 2017   # 5.Patient has been taken off letrozole because of bony pain and started on tamoxifen 20 mg by mouth daily from May 12, 2015; Tamoxifen [Hx of TAH]  # BMD sep 2018- Osteopenia- 1.3;    DIAGNOSIS: BREAST CA ER/PR/her 2 NEU POSITIVE  STAGE:  I ;GOALS: cure  CURRENT/MOST RECENT THERAPY- [  Tamoxifen]      Carcinoma of overlapping sites of right breast in female, estrogen receptor positive ([place])      INTERVAL HISTORY:  Maria Gutierrez 77 y.o.  female pleasant patient above history of stage I breast cancer ER PR positive HER-2/neu positive is here for follow-up.  Patient continues to have intermittent hot flashes.  Up to 2 a day.  She continues to be in Effexor.  Continues to have chronic joint pains back pain.  She is awaiting to have ablation of the nerve in the back with Dr. Sharlet Salina.   Review of Systems  Constitutional: Negative for chills, diaphoresis, fever, malaise/fatigue and weight loss.  HENT: Negative for nosebleeds and sore throat.   Eyes: Negative for double vision.  Respiratory: Negative for cough, hemoptysis, sputum production, shortness of breath and wheezing.   Cardiovascular: Negative for chest pain, palpitations, orthopnea and leg swelling.  Gastrointestinal:  Negative for abdominal pain, blood in stool, constipation, diarrhea, heartburn, melena, nausea and vomiting.  Genitourinary: Negative for dysuria, frequency and urgency.  Musculoskeletal: Positive for back pain and joint pain.  Skin: Negative.  Negative for itching and rash.  Neurological: Negative for dizziness, tingling, focal weakness, weakness and headaches.  Endo/Heme/Allergies: Does not bruise/bleed easily.  Psychiatric/Behavioral: Negative for depression. The patient is not nervous/anxious and does not have insomnia.       PAST MEDICAL HISTORY :  Past Medical History:  Diagnosis Date  . Anxiety   . Arthritis   . Breast cancer of upper-outer quadrant of right female breast ([place]) 06/10/2014   10 mm invasive mammary carcinoma, T1b,Nx; ER 90%, 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 extensive intermediate grade DCIS.   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 GERD (gastroesophageal reflux disease)   . Heart murmur   . Hyperlipidemia   . Hypertension   . Personal history of chemotherapy 2016   right breast ca  . Personal history of radiation therapy 2016   mammosite  . SOB (shortness of breath) 12/03/2014    PAST SURGICAL HISTORY :   Past Surgical History:  Procedure Laterality Date  . ABDOMINAL HYSTERECTOMY    . AXILLARY LYMPH NODE BIOPSY Right 07/14/2014   Procedure: AXILLARY LYMPH NODE BIOPSY;  Surgeon: Robert Bellow, MD;  Location: [facility] ORS;  Service: General;  Laterality: Right;  . BREAST BIOPSY Left 2012   benign  . BREAST BIOPSY Right 05/26/2014   DCIS   . BREAST EXCISIONAL BIOPSY  Right 06/28/2017    ULCERATED SKIN WITH UNDERLYING FAT NECROSIS, ACUTE INFLAMMATION AND REACTIVE EPITHELIAL ATYPIA  . BREAST LUMPECTOMY Right 06/10/2014   DCIS and Invasive ductal carcinoma, clear margins  . BREAST SURGERY Right 06/10/14   Wide excision for intermediate grade DCIS, identification of a 10  millimeter area of invasive mammary carcinoma.  . CHOLECYSTECTOMY  2012  .  COLONOSCOPY  2009  . EYE SURGERY Bilateral 2014  . JOINT REPLACEMENT Left 2001  . JOINT REPLACEMENT Right 2003  . JOINT REPLACEMENT Left 2004   replacement joint broken  . kneee surgery Left   . PORT-A-CATH REMOVAL  02/11/2016   Dr Bary Castilla  . PORTACATH PLACEMENT Left 08/27/2014   Procedure: INSERTION PORT-A-CATH;  Surgeon: Robert Bellow, MD;  Location: [facility] ORS;  Service: General;  Laterality: Left;    FAMILY HISTORY :  History reviewed. No pertinent family history.  SOCIAL HISTORY:   Social History   Tobacco Use  . Smoking status: Never Smoker  . Smokeless tobacco: Never Used  Substance Use Topics  . Alcohol use: No    Alcohol/week: 0.0 standard drinks  . Drug use: No    ALLERGIES:  is allergic to tape.  MEDICATIONS:  Current Outpatient Medications  Medication Sig Dispense Refill  . aspirin EC 81 MG tablet Take 81 mg by mouth daily.    . calcium carbonate (OS-CAL) 600 MG TABS tablet Take 600 mg by mouth daily.    . cyclobenzaprine (FLEXERIL) 10 MG tablet 1/2-1 po qHS prn    . Glucosamine-Chondroit-Vit C-Mn (GLUCOSAMINE 1500 COMPLEX PO) Take 1 tablet by mouth daily.     . hydrochlorothiazide (HYDRODIURIL) 25 MG tablet Take 25 mg by mouth daily.     [name] HYDROcodone-acetaminophen (NORCO) 7.5-325 MG tablet Take 1 tablet by mouth 2 (two) times daily as needed for pain.  0  . lansoprazole (PREVACID) 15 MG capsule Take 15 mg by mouth every morning.     . meloxicam (MOBIC) 15 MG tablet Take 1 tablet (15 mg total) by mouth daily as needed for pain. 30 tablet 3  . metoprolol succinate (TOPROL-XL) 50 MG 24 hr tablet Take 50 mg by mouth every morning. Take with or immediately following a meal.    . simvastatin (ZOCOR) 40 MG tablet Take 1 tablet by mouth daily.    . tamoxifen (NOLVADEX) 20 MG tablet TAKE 1 TABLET BY MOUTH  DAILY 90 tablet 1  . venlafaxine XR (EFFEXOR-XR) 75 MG 24 hr capsule TAKE 1 CAPSULE BY MOUTH  DAILY WITH BREAKFAST 90 capsule 1  . Vitamin D, Ergocalciferol,  (DRISDOL) 50000 units CAPS capsule TAKE 1 CAPSULE BY MOUTH  ONCE EVERY WEEK 10 capsule 2  . fluticasone (FLONASE) 50 MCG/ACT nasal spray Place 1 spray into both nostrils daily. (Patient not taking: Reported on 01/29/2018) 16 g 2   No current facility-administered medications for this visit.    Facility-Administered Medications Ordered in Other Visits  Medication Dose Route Frequency Provider Last Rate Last Dose  . sodium chloride 0.9 % injection 10 mL  10 mL Intravenous PRN Forest Gleason, MD   10 mL at 01/27/15 1416  . sodium chloride flush (NS) 0.9 % injection 10 mL  10 mL Intravenous PRN Charlaine Dalton R, MD   10 mL at 12/10/15 1347    PHYSICAL EXAMINATION: ECOG PERFORMANCE STATUS: 0 - Asymptomatic  BP (!) 143/94   Pulse 90   Temp 98.4 F (36.9 C) (Oral)   Resp 18   Ht '5\' 2"'  (1.575  m)   Wt 272 lb (123.4 kg)   BMI 49.75 kg/m   Filed Weights   01/29/18 1333  Weight: 272 lb (123.4 kg)   Physical Exam  Constitutional: She is oriented to person, place, and time and well-developed, well-nourished, and in no distress.  Obese.  Accompanied by husband.  Walking herself.  HENT:  Head: Normocephalic and atraumatic.  Mouth/Throat: Oropharynx is clear and moist. No oropharyngeal exudate.  Eyes: Pupils are equal, round, and reactive to light.  Neck: Normal range of motion. Neck supple.  Cardiovascular: Normal rate and regular rhythm.  Pulmonary/Chest: Breath sounds normal. No respiratory distress. She has no wheezes.  Abdominal: Soft. Bowel sounds are normal. She exhibits no distension and no mass. There is no tenderness. There is no rebound and no guarding.  Musculoskeletal: Normal range of motion. She exhibits no edema or tenderness.  Neurological: She is alert and oriented to person, place, and time.  Skin: Skin is warm.  Psychiatric: Affect normal.       LABORATORY DATA:  I have reviewed the data as listed    Component Value Date/Time   NA 138 01/29/2018 1312   NA 141  06/05/2014 0843   K 3.7 01/29/2018 1312   K 3.5 06/05/2014 0843   CL 102 01/29/2018 1312   CL 105 06/05/2014 0843   CO2 28 01/29/2018 1312   CO2 27 06/05/2014 0843   GLUCOSE 125 (H) 01/29/2018 1312   GLUCOSE 94 06/05/2014 0843   BUN 21 01/29/2018 1312   BUN 22 (H) 06/05/2014 0843   CREATININE 1.13 (H) 01/29/2018 1312   CREATININE 0.97 06/05/2014 0843   CALCIUM 9.2 01/29/2018 1312   CALCIUM 9.2 06/05/2014 0843   PROT 6.6 01/29/2018 1312   ALBUMIN 3.7 01/29/2018 1312   AST 23 01/29/2018 1312   ALT 16 01/29/2018 1312   ALKPHOS 42 01/29/2018 1312   BILITOT 0.7 01/29/2018 1312   GFRNONAA 47 (L) 01/29/2018 1312   GFRNONAA 58 (L) 06/05/2014 0843   GFRAA 55 (L) 01/29/2018 1312   GFRAA >60 06/05/2014 0843    No results found for: SPEP, UPEP  Lab Results  Component Value Date   WBC 7.6 01/29/2018   NEUTROABS 4.5 01/29/2018   HGB 13.3 01/29/2018   HCT 40.9 01/29/2018   MCV 95.1 01/29/2018   PLT 187 01/29/2018      Chemistry      Component Value Date/Time   NA 138 01/29/2018 1312   NA 141 06/05/2014 0843   K 3.7 01/29/2018 1312   K 3.5 06/05/2014 0843   CL 102 01/29/2018 1312   CL 105 06/05/2014 0843   CO2 28 01/29/2018 1312   CO2 27 06/05/2014 0843   BUN 21 01/29/2018 1312   BUN 22 (H) 06/05/2014 0843   CREATININE 1.13 (H) 01/29/2018 1312   CREATININE 0.97 06/05/2014 0843      Component Value Date/Time   CALCIUM 9.2 01/29/2018 1312   CALCIUM 9.2 06/05/2014 0843   ALKPHOS 42 01/29/2018 1312   AST 23 01/29/2018 1312   ALT 16 01/29/2018 1312   BILITOT 0.7 01/29/2018 1312       RADIOGRAPHIC STUDIES: I have personally reviewed the radiological images as listed and agreed with the findings in the report. No results found.   ASSESSMENT & PLAN:  Carcinoma of overlapping sites of right breast in female, estrogen receptor positive ([place]) # Breast cancer ER/PR/her 2 Neu-POSITIVE; stage I- on tamoxifen.  Stable.  No evidence of recurrence.  Recent mammogram May  2019  normal.  Continue tamoxifen for 5 years discussed.[Will finish in summer 2021.]  # Hot flashes grade 1-2 on Effexor-STABLE.  Continue Effexor.  # back pain-worsened likely not related to malignancy.  Awaiting alcohol ablation [12/05] with pain physician.  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   # sep 2018- BMD- osteopenia- -1.3;Continue calcium daily. Will repeat in sep 2020.  Clinically stable.  # CKD- stage III- creat 1.13/stable.  # DISPOSITION:  # follow up in 6 months/labs-cbc/cmp Dr.B   No orders of the defined types were placed in this encounter.  All questions were answered. The patient knows to call the clinic with any problems, questions or concerns.      Cammie Sickle, MD 01/29/2018 1:47 PM

## 2018-01-29 NOTE — Assessment & Plan Note (Addendum)
#   Breast cancer ER/PR/her 2 Neu-POSITIVE; stage I- on tamoxifen.  Stable.  No evidence of recurrence.  Recent mammogram May 2019 normal.  Continue tamoxifen for 5 years discussed.[Will finish in summer 2021.]  # Hot flashes grade 1-2 on Effexor-STABLE.  Continue Effexor.  # back pain-worsened likely not related to malignancy.  Awaiting alcohol ablation [12/05] with pain physician.  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   # sep 2018- BMD- osteopenia- -1.3;Continue calcium daily. Will repeat in sep 2020.  Clinically stable.  # CKD- stage III- creat 1.13/stable.  # DISPOSITION:  # follow up in 6 months/labs-cbc/cmp Dr.B

## 2018-03-05 ENCOUNTER — Other Ambulatory Visit: Payer: Self-pay | Admitting: Internal Medicine

## 2018-03-21 ENCOUNTER — Other Ambulatory Visit: Payer: Self-pay | Admitting: Internal Medicine

## 2018-05-22 ENCOUNTER — Other Ambulatory Visit: Payer: Self-pay | Admitting: Internal Medicine

## 2018-06-11 ENCOUNTER — Other Ambulatory Visit: Payer: Self-pay | Admitting: Internal Medicine

## 2018-07-27 ENCOUNTER — Other Ambulatory Visit: Payer: Self-pay | Admitting: *Deleted

## 2018-07-27 DIAGNOSIS — C50811 Malignant neoplasm of overlapping sites of right female breast: Secondary | ICD-10-CM

## 2018-07-30 ENCOUNTER — Inpatient Hospital Stay: Payer: Medicare Other | Attending: Internal Medicine | Admitting: Internal Medicine

## 2018-07-30 ENCOUNTER — Inpatient Hospital Stay: Payer: Medicare Other

## 2018-07-30 ENCOUNTER — Other Ambulatory Visit: Payer: Self-pay

## 2018-07-30 DIAGNOSIS — Z170 Estrogen receptor positive status [ER+]: Secondary | ICD-10-CM | POA: Insufficient documentation

## 2018-07-30 DIAGNOSIS — N183 Chronic kidney disease, stage 3 (moderate): Secondary | ICD-10-CM | POA: Insufficient documentation

## 2018-07-30 DIAGNOSIS — C50811 Malignant neoplasm of overlapping sites of right female breast: Secondary | ICD-10-CM

## 2018-07-30 DIAGNOSIS — Z79810 Long term (current) use of selective estrogen receptor modulators (SERMs): Secondary | ICD-10-CM | POA: Diagnosis not present

## 2018-07-30 DIAGNOSIS — M8580 Other specified disorders of bone density and structure, unspecified site: Secondary | ICD-10-CM | POA: Insufficient documentation

## 2018-07-30 LAB — COMPREHENSIVE METABOLIC PANEL
ALT: 15 U/L (ref 0–44)
AST: 22 U/L (ref 15–41)
Albumin: 3.7 g/dL (ref 3.5–5.0)
Alkaline Phosphatase: 51 U/L (ref 38–126)
Anion gap: 10 (ref 5–15)
BUN: 20 mg/dL (ref 8–23)
CO2: 29 mmol/L (ref 22–32)
Calcium: 8.7 mg/dL — ABNORMAL LOW (ref 8.9–10.3)
Chloride: 100 mmol/L (ref 98–111)
Creatinine, Ser: 1.22 mg/dL — ABNORMAL HIGH (ref 0.44–1.00)
GFR calc Af Amer: 49 mL/min — ABNORMAL LOW (ref >60)
GFR calc non Af Amer: 43 mL/min — ABNORMAL LOW (ref >60)
Glucose, Bld: 125 mg/dL — ABNORMAL HIGH (ref 70–99)
Potassium: 3.1 mmol/L — ABNORMAL LOW (ref 3.5–5.1)
Sodium: 139 mmol/L (ref 135–145)
Total Bilirubin: 0.8 mg/dL (ref 0.3–1.2)
Total Protein: 6.7 g/dL (ref 6.5–8.1)

## 2018-07-30 LAB — CBC WITH DIFFERENTIAL/PLATELET
Abs Immature Granulocytes: 0.02 10*3/uL (ref 0.00–0.07)
Basophils Absolute: 0 10*3/uL (ref 0.0–0.1)
Basophils Relative: 0 %
Eosinophils Absolute: 0.1 10*3/uL (ref 0.0–0.5)
Eosinophils Relative: 1 %
HCT: 40.8 % (ref 36.0–46.0)
Hemoglobin: 13.6 g/dL (ref 12.0–15.0)
Immature Granulocytes: 0 %
Lymphocytes Relative: 36 %
Lymphs Abs: 3 10*3/uL (ref 0.7–4.0)
MCH: 31.3 pg (ref 26.0–34.0)
MCHC: 33.3 g/dL (ref 30.0–36.0)
MCV: 93.8 fL (ref 80.0–100.0)
Monocytes Absolute: 0.6 10*3/uL (ref 0.1–1.0)
Monocytes Relative: 8 %
Neutro Abs: 4.5 10*3/uL (ref 1.7–7.7)
Neutrophils Relative %: 55 %
Platelets: 184 10*3/uL (ref 150–400)
RBC: 4.35 MIL/uL (ref 3.87–5.11)
RDW: 12.6 % (ref 11.5–15.5)
WBC: 8.4 10*3/uL (ref 4.0–10.5)
nRBC: 0 % (ref 0.0–0.2)

## 2018-07-30 NOTE — Assessment & Plan Note (Addendum)
#   Breast cancer ER/PR/her 2 Neu-POSITIVE; stage I- on tamoxifen.  Stable.  Awaiting mammogram/appointment with Dr. Bary Castilla.  #  No evidence of recurrence. Continue tamoxifen for 5 years [summer 2021]-10 years discussed.  The fact the patient received chemotherapy reasonable to consider 10 years of anti-hormonal therapy.  Patient is open extended antihormone therapy.  # Hot flashes grade 1-2 stable.  Continue Effexor.  # back pain-worsened likely not related to malignancy.   Stable.   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   # sep 2018- BMD- osteopenia- -1.3;Continue calcium daily. Will order at next visit. Stable.   # CKD- stage III- creat 1.13/stable.  # DISPOSITION: awaiting on mammo this year.  # follow up in 6 months- MD/labs-cbc/cmp Dr.B

## 2018-07-30 NOTE — Progress Notes (Signed)
[place] OFFICE PROGRESS NOTE  Patient Care Team: [name], MD as PCP - General (Family Medicine) [name], MD as Referring Physician (Family Medicine) Bary Castilla Forest Gleason, MD (General Surgery)  Cancer Staging No matching staging information was found for the patient.   Oncology History   1.  Carcinoma of right breast.  Status post lumpectomy and sentinel lymph node evaluation.  Tumor size 1 cm.  Estrogen and progesterone receptor positive.  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  Diagnosis in April of 2016 2.  Accelerated partial breast radiation in June of 2016 3.  Weekly Taxol and Herceptin therapy for adjuvant treatment starting from July of 2016; Finished herceptin July 2017   # 5.Patient has been taken off letrozole because of bony pain and started on tamoxifen 20 mg by mouth daily from May 12, 2015; Tamoxifen [Hx of TAH]  # BMD sep 2018- Osteopenia- 1.3;    DIAGNOSIS: BREAST CA ER/PR/her 2 NEU POSITIVE  STAGE:  I ;GOALS: cure  CURRENT/MOST RECENT THERAPY- [  Tamoxifen]      Carcinoma of overlapping sites of right breast in female, estrogen receptor positive ([place])      INTERVAL HISTORY:  Maria Gutierrez 78 y.o.  female pleasant patient above history of stage I breast cancer ER PR positive HER-2/neu positive is here for follow-up.  Patient continues to have intermittent hot flashes 1-2 a day.  She continues venlafaxine.  Not any worse.  She has not had a mammogram this year awaiting/awaiting follow-up with Dr. Bary Castilla.  Chronic joint pains.  Chronic back pain not any worse.  No lumps or bumps.  Review of Systems  Constitutional: Negative for chills, diaphoresis, fever, malaise/fatigue and weight loss.  HENT: Negative for nosebleeds and sore throat.   Eyes: Negative for double vision.  Respiratory: Negative for cough, hemoptysis, sputum production, shortness of breath and wheezing.   Cardiovascular: Negative for chest pain, palpitations, orthopnea  and leg swelling.  Gastrointestinal: Negative for abdominal pain, blood in stool, constipation, diarrhea, heartburn, melena, nausea and vomiting.  Genitourinary: Negative for dysuria, frequency and urgency.  Musculoskeletal: Positive for back pain and joint pain.  Skin: Negative.  Negative for itching and rash.  Neurological: Negative for dizziness, tingling, focal weakness, weakness and headaches.  Endo/Heme/Allergies: Does not bruise/bleed easily.  Psychiatric/Behavioral: Negative for depression. The patient is not nervous/anxious and does not have insomnia.       PAST MEDICAL HISTORY :  Past Medical History:  Diagnosis Date  . Anxiety   . Arthritis   . Breast cancer of upper-outer quadrant of right female breast ([place]) 06/10/2014   10 mm invasive mammary carcinoma, T1b,Nx; ER 90%, 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 extensive intermediate grade DCIS.   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 GERD (gastroesophageal reflux disease)   . Heart murmur   . Hyperlipidemia   . Hypertension   . Personal history of chemotherapy 2016   right breast ca  . Personal history of radiation therapy 2016   mammosite  . SOB (shortness of breath) 12/03/2014    PAST SURGICAL HISTORY :   Past Surgical History:  Procedure Laterality Date  . ABDOMINAL HYSTERECTOMY    . AXILLARY LYMPH NODE BIOPSY Right 07/14/2014   Procedure: AXILLARY LYMPH NODE BIOPSY;  Surgeon: Robert Bellow, MD;  Location: [facility] ORS;  Service: General;  Laterality: Right;  . BREAST BIOPSY Left 2012   benign  . BREAST BIOPSY Right 05/26/2014   DCIS   . BREAST  EXCISIONAL BIOPSY Right 06/28/2017    ULCERATED SKIN WITH UNDERLYING FAT NECROSIS, ACUTE INFLAMMATION AND REACTIVE EPITHELIAL ATYPIA  . BREAST LUMPECTOMY Right 06/10/2014   DCIS and Invasive ductal carcinoma, clear margins  . BREAST SURGERY Right 06/10/14   Wide excision for intermediate grade DCIS, identification of a 10  millimeter area of invasive mammary  carcinoma.  . CHOLECYSTECTOMY  2012  . COLONOSCOPY  2009  . EYE SURGERY Bilateral 2014  . JOINT REPLACEMENT Left 2001  . JOINT REPLACEMENT Right 2003  . JOINT REPLACEMENT Left 2004   replacement joint broken  . kneee surgery Left   . PORT-A-CATH REMOVAL  02/11/2016   Dr Bary Castilla  . PORTACATH PLACEMENT Left 08/27/2014   Procedure: INSERTION PORT-A-CATH;  Surgeon: Robert Bellow, MD;  Location: [facility] ORS;  Service: General;  Laterality: Left;    FAMILY HISTORY :  No family history on file.  SOCIAL HISTORY:   Social History   Tobacco Use  . Smoking status: Never Smoker  . Smokeless tobacco: Never Used  Substance Use Topics  . Alcohol use: No    Alcohol/week: 0.0 standard drinks  . Drug use: No    ALLERGIES:  is allergic to tape.  MEDICATIONS:  Current Outpatient Medications  Medication Sig Dispense Refill  . aspirin EC 81 MG tablet Take 81 mg by mouth daily.    . calcium carbonate (OS-CAL) 600 MG TABS tablet Take 600 mg by mouth daily.    . cyclobenzaprine (FLEXERIL) 10 MG tablet 1/2-1 po qHS prn    . Glucosamine-Chondroit-Vit C-Mn (GLUCOSAMINE 1500 COMPLEX PO) Take 1 tablet by mouth daily.     . hydrochlorothiazide (HYDRODIURIL) 25 MG tablet Take 25 mg by mouth daily.     [name] HYDROcodone-acetaminophen (NORCO) 7.5-325 MG tablet Take 1 tablet by mouth 2 (two) times daily as needed for pain.  0  . lansoprazole (PREVACID) 15 MG capsule Take 15 mg by mouth every morning.     . meloxicam (MOBIC) 15 MG tablet Take 1 tablet (15 mg total) by mouth daily as needed for pain. 30 tablet 3  . metoprolol succinate (TOPROL-XL) 50 MG 24 hr tablet Take 50 mg by mouth every morning. Take with or immediately following a meal.    . simvastatin (ZOCOR) 40 MG tablet Take 1 tablet by mouth daily.    . tamoxifen (NOLVADEX) 20 MG tablet TAKE 1 TABLET BY MOUTH  DAILY 90 tablet 1  . venlafaxine XR (EFFEXOR-XR) 75 MG 24 hr capsule TAKE 1 CAPSULE BY MOUTH  DAILY WITH BREAKFAST 90 capsule 1  . Vitamin D,  Ergocalciferol, (DRISDOL) 1.25 MG (50000 UT) CAPS capsule TAKE 1 CAPSULE BY MOUTH  ONCE EVERY WEEK 4 capsule 6  . fluticasone (FLONASE) 50 MCG/ACT nasal spray Place 1 spray into both nostrils daily. (Patient not taking: Reported on 01/29/2018) 16 g 2   No current facility-administered medications for this visit.    Facility-Administered Medications Ordered in Other Visits  Medication Dose Route Frequency Provider Last Rate Last Dose  . sodium chloride 0.9 % injection 10 mL  10 mL Intravenous PRN Forest Gleason, MD   10 mL at 01/27/15 1416  . sodium chloride flush (NS) 0.9 % injection 10 mL  10 mL Intravenous PRN Charlaine Dalton R, MD   10 mL at 12/10/15 1347    PHYSICAL EXAMINATION: ECOG PERFORMANCE STATUS: 0 - Asymptomatic  BP 132/86   Pulse 93   Temp 99.1 F (37.3 C) (Tympanic)   Resp (!) 22   There  were no vitals filed for this visit. Physical Exam  Constitutional: She is oriented to person, place, and time and well-developed, well-nourished, and in no distress.  Obese. Alone.   HENT:  Head: Normocephalic and atraumatic.  Mouth/Throat: Oropharynx is clear and moist. No oropharyngeal exudate.  Eyes: Pupils are equal, round, and reactive to light.  Neck: Normal range of motion. Neck supple.  Cardiovascular: Normal rate and regular rhythm.  Pulmonary/Chest: Breath sounds normal. No respiratory distress. She has no wheezes.  Abdominal: Soft. Bowel sounds are normal. She exhibits no distension and no mass. There is no abdominal tenderness. There is no rebound and no guarding.  Musculoskeletal: Normal range of motion.        General: No tenderness or edema.  Neurological: She is alert and oriented to person, place, and time.  Skin: Skin is warm.  Psychiatric: Affect normal.       LABORATORY DATA:  I have reviewed the data as listed    Component Value Date/Time   NA 139 07/30/2018 1254   NA 141 06/05/2014 0843   K 3.1 (L) 07/30/2018 1254   K 3.5 06/05/2014 0843   CL  100 07/30/2018 1254   CL 105 06/05/2014 0843   CO2 29 07/30/2018 1254   CO2 27 06/05/2014 0843   GLUCOSE 125 (H) 07/30/2018 1254   GLUCOSE 94 06/05/2014 0843   BUN 20 07/30/2018 1254   BUN 22 (H) 06/05/2014 0843   CREATININE 1.22 (H) 07/30/2018 1254   CREATININE 0.97 06/05/2014 0843   CALCIUM 8.7 (L) 07/30/2018 1254   CALCIUM 9.2 06/05/2014 0843   PROT 6.7 07/30/2018 1254   ALBUMIN 3.7 07/30/2018 1254   AST 22 07/30/2018 1254   ALT 15 07/30/2018 1254   ALKPHOS 51 07/30/2018 1254   BILITOT 0.8 07/30/2018 1254   GFRNONAA 43 (L) 07/30/2018 1254   GFRNONAA 58 (L) 06/05/2014 0843   GFRAA 49 (L) 07/30/2018 1254   GFRAA >60 06/05/2014 0843    No results found for: SPEP, UPEP  Lab Results  Component Value Date   WBC 8.4 07/30/2018   NEUTROABS 4.5 07/30/2018   HGB 13.6 07/30/2018   HCT 40.8 07/30/2018   MCV 93.8 07/30/2018   PLT 184 07/30/2018      Chemistry      Component Value Date/Time   NA 139 07/30/2018 1254   NA 141 06/05/2014 0843   K 3.1 (L) 07/30/2018 1254   K 3.5 06/05/2014 0843   CL 100 07/30/2018 1254   CL 105 06/05/2014 0843   CO2 29 07/30/2018 1254   CO2 27 06/05/2014 0843   BUN 20 07/30/2018 1254   BUN 22 (H) 06/05/2014 0843   CREATININE 1.22 (H) 07/30/2018 1254   CREATININE 0.97 06/05/2014 0843      Component Value Date/Time   CALCIUM 8.7 (L) 07/30/2018 1254   CALCIUM 9.2 06/05/2014 0843   ALKPHOS 51 07/30/2018 1254   AST 22 07/30/2018 1254   ALT 15 07/30/2018 1254   BILITOT 0.8 07/30/2018 1254       RADIOGRAPHIC STUDIES: I have personally reviewed the radiological images as listed and agreed with the findings in the report. No results found.   ASSESSMENT & PLAN:  Carcinoma of overlapping sites of right breast in female, estrogen receptor positive ([place]) # Breast cancer ER/PR/her 2 Neu-POSITIVE; stage I- on tamoxifen.  Stable.   #  No evidence of recurrence. Continue tamoxifen for 5 years discussed.[Will finish in summer 2021.]  # Hot  flashes grade 1-2  on Effexor-worse; wants to continue   # back pain-worsened likely not related to malignancy.   Stable.   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   # sep 2018- BMD- osteopenia- -1.3;Continue calcium daily. Will order at next visit. Stable.   # CKD- stage III- creat 1.13/stable.  # DISPOSITION: awaiting on mammo this year.  # follow up in 6 months- MD/labs-cbc/cmp Dr.B   Orders Placed This Encounter  Procedures  . CBC with Differential    Standing Status:   Future    Standing Expiration Date:   07/30/2019  . Comprehensive metabolic panel    Standing Status:   Future    Standing Expiration Date:   07/30/2019   All questions were answered. The patient knows to call the clinic with any problems, questions or concerns.      Maria Sickle, MD 07/30/2018 2:30 PM

## 2018-08-01 ENCOUNTER — Telehealth: Payer: Self-pay | Admitting: General Surgery

## 2018-08-01 NOTE — Telephone Encounter (Signed)
Patient is calling and is asking about her mammogram  Appointment please call patient and advise.

## 2018-08-22 ENCOUNTER — Other Ambulatory Visit: Payer: Self-pay

## 2018-08-22 DIAGNOSIS — C50811 Malignant neoplasm of overlapping sites of right female breast: Secondary | ICD-10-CM

## 2018-08-22 DIAGNOSIS — Z170 Estrogen receptor positive status [ER+]: Secondary | ICD-10-CM

## 2018-09-07 ENCOUNTER — Other Ambulatory Visit: Payer: Medicare Other

## 2018-09-10 ENCOUNTER — Other Ambulatory Visit: Payer: Self-pay

## 2018-09-10 ENCOUNTER — Ambulatory Visit
Admission: RE | Admit: 2018-09-10 | Discharge: 2018-09-10 | Disposition: A | Discharge status: Home / Self Care | Payer: Medicare Other | Source: Ambulatory Visit | Attending: General Surgery | Admitting: General Surgery

## 2018-09-10 DIAGNOSIS — C50811 Malignant neoplasm of overlapping sites of right female breast: Secondary | ICD-10-CM

## 2018-09-10 DIAGNOSIS — Z170 Estrogen receptor positive status [ER+]: Secondary | ICD-10-CM | POA: Insufficient documentation

## 2018-09-11 ENCOUNTER — Other Ambulatory Visit: Payer: Self-pay | Admitting: Internal Medicine

## 2018-09-13 ENCOUNTER — Encounter: Payer: Self-pay | Admitting: General Surgery

## 2018-09-13 ENCOUNTER — Ambulatory Visit: Payer: Medicare Other | Admitting: General Surgery

## 2018-09-13 ENCOUNTER — Other Ambulatory Visit: Payer: Self-pay

## 2018-09-13 VITALS — BP 138/82 | HR 94 | Temp 97.5°F | Ht 60.0 in | Wt 283.0 lb

## 2018-09-13 DIAGNOSIS — Z170 Estrogen receptor positive status [ER+]: Secondary | ICD-10-CM

## 2018-09-13 DIAGNOSIS — C50811 Malignant neoplasm of overlapping sites of right female breast: Secondary | ICD-10-CM

## 2018-09-13 NOTE — Patient Instructions (Signed)
The patient has been asked to return to the office in one year with a bilateral diagnostic mammogram.The patient is aware to call back for any questions or concerns. 

## 2018-09-13 NOTE — Progress Notes (Unsigned)
Patient ID: Maria Gutierrez, female   DOB: 1941/01/06, 78 y.o.   MRN: [id number]  Chief Complaint  Patient presents with  . Follow-up    HPI Maria Gutierrez is a 78 y.o. female who presents for a breast evaluation. The most recent mammogram was done on 09/07/2018. She has pain in her right breast off and on.  Patient does perform regular self breast checks and gets regular mammograms done.   Having bladder problems [place]   HPI  Past Medical History:  Diagnosis Date  . Anxiety   . Arthritis   . Breast cancer of upper-outer quadrant of right female breast ([place]) 06/10/2014   10 mm invasive mammary carcinoma, T1b,Nx; ER 90%, 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 extensive intermediate grade DCIS.   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 GERD (gastroesophageal reflux disease)   . Heart murmur   . Hyperlipidemia   . Hypertension   . Personal history of chemotherapy 2016   right breast ca  . Personal history of radiation therapy 2016   mammosite  . SOB (shortness of breath) 12/03/2014    Past Surgical History:  Procedure Laterality Date  . ABDOMINAL HYSTERECTOMY    . AXILLARY LYMPH NODE BIOPSY Right 07/14/2014   Procedure: AXILLARY LYMPH NODE BIOPSY;  Surgeon: Robert Bellow, MD;  Location: [facility] ORS;  Service: General;  Laterality: Right;  . BREAST BIOPSY Left 2012   benign  . BREAST BIOPSY Right 05/26/2014   DCIS   . BREAST EXCISIONAL BIOPSY Right 06/28/2017    ULCERATED SKIN WITH UNDERLYING FAT NECROSIS, ACUTE INFLAMMATION AND REACTIVE EPITHELIAL ATYPIA  . BREAST LUMPECTOMY Right 06/10/2014   DCIS and Invasive ductal carcinoma, clear margins  . BREAST SURGERY Right 06/10/14   Wide excision for intermediate grade DCIS, identification of a 10  millimeter area of invasive mammary carcinoma.  . CHOLECYSTECTOMY  2012  . COLONOSCOPY  2009  . EYE SURGERY Bilateral 2014  . JOINT REPLACEMENT Left 2001  . JOINT REPLACEMENT Right 2003  . JOINT REPLACEMENT Left 2004   replacement joint broken  . kneee surgery Left   . PORT-A-CATH REMOVAL  02/11/2016   Dr Bary Castilla  . PORTACATH PLACEMENT Left 08/27/2014   Procedure: INSERTION PORT-A-CATH;  Surgeon: Robert Bellow, MD;  Location: [facility] ORS;  Service: General;  Laterality: Left;    No family history on file.  Social History Social History   Tobacco Use  . Smoking status: Never Smoker  . Smokeless tobacco: Never Used  Substance Use Topics  . Alcohol use: No    Alcohol/week: 0.0 standard drinks  . Drug use: No    Allergies  Allergen Reactions  . Tape Other (See Comments)    Blisters on skin from tape during recent surgery.    Current Outpatient Medications  Medication Sig Dispense Refill  . aspirin EC 81 MG tablet Take 81 mg by mouth daily.    . calcium carbonate (OS-CAL) 600 MG TABS tablet Take 600 mg by mouth daily.    . cyclobenzaprine (FLEXERIL) 10 MG tablet 1/2-1 po qHS prn    . fluticasone (FLONASE) 50 MCG/ACT nasal spray Place 1 spray into both nostrils daily. 16 g 2  . Glucosamine-Chondroit-Vit C-Mn (GLUCOSAMINE 1500 COMPLEX PO) Take 1 tablet by mouth daily.     . hydrochlorothiazide (HYDRODIURIL) 25 MG tablet Take 25 mg by mouth daily.     [name] HYDROcodone-acetaminophen (NORCO) 7.5-325 MG tablet Take 1 tablet by mouth 2 (two) times daily  as needed for pain.  0  . lansoprazole (PREVACID) 15 MG capsule Take 15 mg by mouth every morning.     . meloxicam (MOBIC) 15 MG tablet Take 1 tablet (15 mg total) by mouth daily as needed for pain. 30 tablet 3  . metoprolol succinate (TOPROL-XL) 50 MG 24 hr tablet Take 50 mg by mouth every morning. Take with or immediately following a meal.    . simvastatin (ZOCOR) 40 MG tablet Take 1 tablet by mouth daily.    . tamoxifen (NOLVADEX) 20 MG tablet TAKE 1 TABLET BY MOUTH  DAILY 90 tablet 1  . venlafaxine XR (EFFEXOR-XR) 75 MG 24 hr capsule TAKE 1 CAPSULE BY MOUTH  DAILY WITH BREAKFAST 90 capsule 1  . Vitamin D, Ergocalciferol, (DRISDOL) 1.25 MG (50000 UT)  CAPS capsule TAKE 1 CAPSULE BY MOUTH  ONCE EVERY WEEK 4 capsule 6   No current facility-administered medications for this visit.    Facility-Administered Medications Ordered in Other Visits  Medication Dose Route Frequency Provider Last Rate Last Dose  . sodium chloride 0.9 % injection 10 mL  10 mL Intravenous PRN Forest Gleason, MD   10 mL at 01/27/15 1416  . sodium chloride flush (NS) 0.9 % injection 10 mL  10 mL Intravenous PRN Cammie Sickle, MD   10 mL at 12/10/15 1347    Review of Systems Review of Systems  Constitutional: Negative.   Respiratory: Negative.   Cardiovascular: Negative.     Blood pressure 138/82, pulse 94, temperature (!) 97.5 F (36.4 C), temperature source Skin, height 5' (1.524 m), weight 283 lb (128.4 kg), SpO2 98 %.  Physical Exam Physical Exam Constitutional:      Appearance: She is well-developed.  Eyes:     General: No scleral icterus.    Conjunctiva/sclera: Conjunctivae normal.  Neck:     Musculoskeletal: Neck supple.  Cardiovascular:     Rate and Rhythm: Normal rate and regular rhythm.     Heart sounds: Normal heart sounds.  Pulmonary:     Effort: Pulmonary effort is normal.     Breath sounds: Normal breath sounds.  Chest:     Breasts:        Right: Normal.        Left: Normal.  Lymphadenopathy:     Cervical: No cervical adenopathy.  Skin:    General: Skin is warm and dry.  Neurological:     Mental Status: She is alert and oriented to person, place, and time.     Data Reviewed ***  Assessment ***  Plan  The patient has been asked to return to the office in one year with a bilateral diagnostic mammogram.The patient is aware to call back for any questions or concerns.  HPI, Physical Exam, Assessment and Plan have been scribed under the direction and in the presence of Hervey Ard, MD.  Gaspar Cola, CMA  Gaspar Cola 09/13/2018, 10:36 AM

## 2018-09-28 ENCOUNTER — Encounter: Payer: Self-pay | Admitting: General Surgery

## 2018-11-12 ENCOUNTER — Other Ambulatory Visit: Payer: Self-pay | Admitting: Internal Medicine

## 2018-11-14 ENCOUNTER — Encounter: Payer: Self-pay | Admitting: Radiation Oncology

## 2018-11-14 ENCOUNTER — Ambulatory Visit
Admission: RE | Admit: 2018-11-14 | Discharge: 2018-11-14 | Disposition: A | Discharge status: Home / Self Care | Payer: Medicare Other | Source: Ambulatory Visit | Attending: Radiation Oncology | Admitting: Radiation Oncology

## 2018-11-14 ENCOUNTER — Encounter (INDEPENDENT_AMBULATORY_CARE_PROVIDER_SITE_OTHER): Payer: Self-pay

## 2018-11-14 ENCOUNTER — Other Ambulatory Visit: Payer: Self-pay

## 2018-11-14 VITALS — BP 134/88 | HR 103 | Temp 98.3°F | Resp 16 | Wt 283.2 lb

## 2018-11-14 DIAGNOSIS — Z923 Personal history of irradiation: Secondary | ICD-10-CM | POA: Insufficient documentation

## 2018-11-14 DIAGNOSIS — C50811 Malignant neoplasm of overlapping sites of right female breast: Secondary | ICD-10-CM

## 2018-11-14 DIAGNOSIS — Z170 Estrogen receptor positive status [ER+]: Secondary | ICD-10-CM | POA: Diagnosis not present

## 2018-11-14 DIAGNOSIS — Z79810 Long term (current) use of selective estrogen receptor modulators (SERMs): Secondary | ICD-10-CM | POA: Insufficient documentation

## 2018-11-14 NOTE — Progress Notes (Signed)
Radiation Oncology Follow up Note  Name: Maria Gutierrez   Date:   11/14/2018 MRN:  [id number] DOB: 15-Jul-1940    This 78 y.o. female presents to the clinic today for 4-year follow-up status post accelerated partial breast radiation to her right breast for stage I ER PR positive invasive mammary carcinoma.  REFERRING PROVIDER: [name], MD  HPI: Patient is a 78 year old female now at 4 years having completed accelerated partial breast radiation to her right breast for stage I ER PR positive invasive mammary carcinoma.  Seen today in routine follow-up she is doing well.  She specifically denies breast tenderness cough or bone pain..  Mammograms performed back in July which I have reviewed were BI-RADS 2 benign.  She is currently on tamoxifen tolerating that well without side effect.  COMPLICATIONS OF TREATMENT: none  FOLLOW UP COMPLIANCE: keeps appointments   PHYSICAL EXAM:  BP 134/88 (BP Location: Left Arm, Patient Position: Sitting)   Pulse (!) 103   Temp 98.3 F (36.8 C) (Tympanic)   Resp 16   Wt 283 lb 3.2 oz (128.5 kg)   BMI 55.31 kg/m  Morbidly obese female in NAD.  Lungs are clear to A&P cardiac examination essentially unremarkable with regular rate and rhythm. No dominant mass or nodularity is noted in either breast in 2 positions examined. Incision is well-healed. No axillary or supraclavicular adenopathy is appreciated. Cosmetic result is excellent.  Well-developed well-nourished patient in NAD. HEENT reveals PERLA, EOMI, discs not visualized.  Oral cavity is clear. No oral mucosal lesions are identified. Neck is clear without evidence of cervical or supraclavicular adenopathy. Lungs are clear to A&P. Cardiac examination is essentially unremarkable with regular rate and rhythm without murmur rub or thrill. Abdomen is benign with no organomegaly or masses noted. Motor sensory and DTR levels are equal and symmetric in the upper and lower extremities. Cranial nerves II through XII  are grossly intact. Proprioception is intact. No peripheral adenopathy or edema is identified. No motor or sensory levels are noted. Crude visual fields are within normal range.  RADIOLOGY RESULTS: Mammograms reviewed compatible with above-stated findings  PLAN: Present time patient continues to do well 4 years out from accelerated partial breast radiation with no evidence of disease.  I am pleased with her overall progress.  She continues on tamoxifen.  She is already scheduled for follow-up mammograms.  I am going to turn over follow-up care to her other providers.  I would be happy to reevaluate the patient anytime should further consultation be indicated.  I would like to take this opportunity to thank you for allowing me to participate in the care of your patient.Noreene Filbert, MD

## 2018-12-10 ENCOUNTER — Other Ambulatory Visit: Payer: Self-pay | Admitting: *Deleted

## 2018-12-11 MED ORDER — VITAMIN D (ERGOCALCIFEROL) 1.25 MG (50000 UNIT) PO CAPS: ORAL_CAPSULE | ORAL | 6 refills | Status: DC

## 2019-02-04 ENCOUNTER — Other Ambulatory Visit: Payer: Self-pay

## 2019-02-04 ENCOUNTER — Inpatient Hospital Stay (HOSPITAL_BASED_OUTPATIENT_CLINIC_OR_DEPARTMENT_OTHER): Payer: Medicare Other | Admitting: Internal Medicine

## 2019-02-04 ENCOUNTER — Inpatient Hospital Stay: Payer: Medicare Other | Attending: Internal Medicine

## 2019-02-04 DIAGNOSIS — N951 Menopausal and female climacteric states: Secondary | ICD-10-CM | POA: Diagnosis not present

## 2019-02-04 DIAGNOSIS — N183 Chronic kidney disease, stage 3 unspecified: Secondary | ICD-10-CM | POA: Diagnosis not present

## 2019-02-04 DIAGNOSIS — E785 Hyperlipidemia, unspecified: Secondary | ICD-10-CM | POA: Diagnosis not present

## 2019-02-04 DIAGNOSIS — I10 Essential (primary) hypertension: Secondary | ICD-10-CM | POA: Insufficient documentation

## 2019-02-04 DIAGNOSIS — Z9221 Personal history of antineoplastic chemotherapy: Secondary | ICD-10-CM | POA: Diagnosis not present

## 2019-02-04 DIAGNOSIS — F419 Anxiety disorder, unspecified: Secondary | ICD-10-CM | POA: Insufficient documentation

## 2019-02-04 DIAGNOSIS — Z923 Personal history of irradiation: Secondary | ICD-10-CM | POA: Insufficient documentation

## 2019-02-04 DIAGNOSIS — Z7982 Long term (current) use of aspirin: Secondary | ICD-10-CM | POA: Insufficient documentation

## 2019-02-04 DIAGNOSIS — M8580 Other specified disorders of bone density and structure, unspecified site: Secondary | ICD-10-CM | POA: Insufficient documentation

## 2019-02-04 DIAGNOSIS — Z170 Estrogen receptor positive status [ER+]: Secondary | ICD-10-CM | POA: Insufficient documentation

## 2019-02-04 DIAGNOSIS — C50811 Malignant neoplasm of overlapping sites of right female breast: Secondary | ICD-10-CM

## 2019-02-04 DIAGNOSIS — Z79899 Other long term (current) drug therapy: Secondary | ICD-10-CM | POA: Insufficient documentation

## 2019-02-04 DIAGNOSIS — Z79810 Long term (current) use of selective estrogen receptor modulators (SERMs): Secondary | ICD-10-CM | POA: Insufficient documentation

## 2019-02-04 DIAGNOSIS — Z90710 Acquired absence of both cervix and uterus: Secondary | ICD-10-CM | POA: Insufficient documentation

## 2019-02-04 LAB — CBC WITH DIFFERENTIAL/PLATELET
Abs Immature Granulocytes: 0.01 10*3/uL (ref 0.00–0.07)
Basophils Absolute: 0 10*3/uL (ref 0.0–0.1)
Basophils Relative: 0 %
Eosinophils Absolute: 0.1 10*3/uL (ref 0.0–0.5)
Eosinophils Relative: 2 %
HCT: 41.2 % (ref 36.0–46.0)
Hemoglobin: 13.6 g/dL (ref 12.0–15.0)
Immature Granulocytes: 0 %
Lymphocytes Relative: 36 %
Lymphs Abs: 3.1 10*3/uL (ref 0.7–4.0)
MCH: 31.5 pg (ref 26.0–34.0)
MCHC: 33 g/dL (ref 30.0–36.0)
MCV: 95.4 fL (ref 80.0–100.0)
Monocytes Absolute: 0.7 10*3/uL (ref 0.1–1.0)
Monocytes Relative: 8 %
Neutro Abs: 4.7 10*3/uL (ref 1.7–7.7)
Neutrophils Relative %: 54 %
Platelets: 176 10*3/uL (ref 150–400)
RBC: 4.32 MIL/uL (ref 3.87–5.11)
RDW: 12.1 % (ref 11.5–15.5)
WBC: 8.7 10*3/uL (ref 4.0–10.5)
nRBC: 0 % (ref 0.0–0.2)

## 2019-02-04 LAB — COMPREHENSIVE METABOLIC PANEL
ALT: 17 U/L (ref 0–44)
AST: 25 U/L (ref 15–41)
Albumin: 3.7 g/dL (ref 3.5–5.0)
Alkaline Phosphatase: 47 U/L (ref 38–126)
Anion gap: 7 (ref 5–15)
BUN: 18 mg/dL (ref 8–23)
CO2: 29 mmol/L (ref 22–32)
Calcium: 8.5 mg/dL — ABNORMAL LOW (ref 8.9–10.3)
Chloride: 104 mmol/L (ref 98–111)
Creatinine, Ser: 1.15 mg/dL — ABNORMAL HIGH (ref 0.44–1.00)
GFR calc Af Amer: 53 mL/min — ABNORMAL LOW (ref >60)
GFR calc non Af Amer: 46 mL/min — ABNORMAL LOW (ref >60)
Glucose, Bld: 115 mg/dL — ABNORMAL HIGH (ref 70–99)
Potassium: 3.2 mmol/L — ABNORMAL LOW (ref 3.5–5.1)
Sodium: 140 mmol/L (ref 135–145)
Total Bilirubin: 0.8 mg/dL (ref 0.3–1.2)
Total Protein: 6.5 g/dL (ref 6.5–8.1)

## 2019-02-04 NOTE — Assessment & Plan Note (Addendum)
#   Breast cancer ER/PR/her 2 Neu-POSITIVE; stage I- on tamoxifen [s/p TAH].  STABLE. July 202-mammo-WNL.   #  No evidence of recurrence. Continue tamoxifen for 5 years [summer 2021].  Given need for previous chemotherapy-extended duration is also reasonable.  Will discuss at further visits.  Continue tamoxifen for now.  # Hot flashes grade 1-stable.  Continue Effexor.   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   # sep 2018- BMD- osteopenia- -1.3;Continue calcium daily. STABLE. Will order BMD at next visit.  # CKD- stage III- creat 1.13-STABLE.   # DISPOSITION:   # follow up in 6 months- MD/labs-cbc/cmp Dr.B

## 2019-02-04 NOTE — Progress Notes (Signed)
[facility] OFFICE PROGRESS NOTE  Patient Care Team: [name], MD as PCP - General (Family Medicine) [name], MD as Referring Physician (Family Medicine) Bary Castilla Forest Gleason, MD (General Surgery)  Cancer Staging No matching staging information was found for the patient.   Oncology History Overview Note  1.  Carcinoma of right breast.  Status post lumpectomy and sentinel lymph node evaluation.  Tumor size 1 cm.  Estrogen and progesterone receptor positive.  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  Diagnosis in April of 2016 2.  Accelerated partial breast radiation in June of 2016 3.  Weekly Taxol and Herceptin therapy for adjuvant treatment starting from July of 2016; Finished herceptin July 2017   # 5.Patient has been taken off letrozole because of bony pain and started on tamoxifen 20 mg by mouth daily from May 12, 2015; Tamoxifen [Hx of TAH]  # BMD sep 2018- Osteopenia- 1.3;    DIAGNOSIS: BREAST CA ER/PR/her 2 NEU POSITIVE  STAGE:  I ;GOALS: cure  CURRENT/MOST RECENT THERAPY- [  Tamoxifen]    Carcinoma of overlapping sites of right breast in female, estrogen receptor positive ([place])      INTERVAL HISTORY:  Maria Gutierrez 78 y.o.  female pleasant patient above history of stage I breast cancer ER PR positive HER-2/neu positive is here for follow-up.  Patient is quite distraught given the loss of her older sister/and brother-in-law to Covid.  She is concerned about going to the funeral.  Patient continues to have chronic joint pains chronic back pain not any worse.  No lumps or bumps.  Hot flashes improved.  She continues with Effexor.   Review of Systems  Constitutional: Negative for chills, diaphoresis, fever, malaise/fatigue and weight loss.  HENT: Negative for nosebleeds and sore throat.   Eyes: Negative for double vision.  Respiratory: Negative for cough, hemoptysis, sputum production, shortness of breath and wheezing.   Cardiovascular: Negative for  chest pain, palpitations, orthopnea and leg swelling.  Gastrointestinal: Negative for abdominal pain, blood in stool, constipation, diarrhea, heartburn, melena, nausea and vomiting.  Genitourinary: Negative for dysuria, frequency and urgency.  Musculoskeletal: Positive for back pain and joint pain.  Skin: Negative.  Negative for itching and rash.  Neurological: Negative for dizziness, tingling, focal weakness, weakness and headaches.  Endo/Heme/Allergies: Does not bruise/bleed easily.  Psychiatric/Behavioral: Negative for depression. The patient is not nervous/anxious and does not have insomnia.       PAST MEDICAL HISTORY :  Past Medical History:  Diagnosis Date  . Anxiety   . Arthritis   . Breast cancer of upper-outer quadrant of right female breast ([place]) 06/10/2014   10 mm invasive mammary carcinoma, T1b,Nx; ER 90%, 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 extensive intermediate grade DCIS.   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 GERD (gastroesophageal reflux disease)   . Heart murmur   . Hyperlipidemia   . Hypertension   . Personal history of chemotherapy 2016   right breast ca  . Personal history of radiation therapy 2016   mammosite  . SOB (shortness of breath) 12/03/2014    PAST SURGICAL HISTORY :   Past Surgical History:  Procedure Laterality Date  . ABDOMINAL HYSTERECTOMY    . AXILLARY LYMPH NODE BIOPSY Right 07/14/2014   Procedure: AXILLARY LYMPH NODE BIOPSY;  Surgeon: Robert Bellow, MD;  Location: [facility] ORS;  Service: General;  Laterality: Right;  . BREAST BIOPSY Left 2012   benign  . BREAST BIOPSY Right 05/26/2014   DCIS   .  BREAST EXCISIONAL BIOPSY Right 06/28/2017    ULCERATED SKIN WITH UNDERLYING FAT NECROSIS, ACUTE INFLAMMATION AND REACTIVE EPITHELIAL ATYPIA  . BREAST LUMPECTOMY Right 06/10/2014   DCIS and Invasive ductal carcinoma, clear margins  . BREAST SURGERY Right 06/10/14   Wide excision for intermediate grade DCIS, identification of a 10   millimeter area of invasive mammary carcinoma.  . CHOLECYSTECTOMY  2012  . COLONOSCOPY  2009  . EYE SURGERY Bilateral 2014  . JOINT REPLACEMENT Left 2001  . JOINT REPLACEMENT Right 2003  . JOINT REPLACEMENT Left 2004   replacement joint broken  . kneee surgery Left   . PORT-A-CATH REMOVAL  02/11/2016   Dr Bary Castilla  . PORTACATH PLACEMENT Left 08/27/2014   Procedure: INSERTION PORT-A-CATH;  Surgeon: Robert Bellow, MD;  Location: [facility] ORS;  Service: General;  Laterality: Left;    FAMILY HISTORY :  No family history on file.  SOCIAL HISTORY:   Social History   Tobacco Use  . Smoking status: Never Smoker  . Smokeless tobacco: Never Used  Substance Use Topics  . Alcohol use: No    Alcohol/week: 0.0 standard drinks  . Drug use: No    ALLERGIES:  is allergic to tape.  MEDICATIONS:  Current Outpatient Medications  Medication Sig Dispense Refill  . aspirin EC 81 MG tablet Take 81 mg by mouth daily.    . calcium carbonate (OS-CAL) 600 MG TABS tablet Take 600 mg by mouth daily.    . cyclobenzaprine (FLEXERIL) 10 MG tablet 1/2-1 po qHS prn    . Glucosamine-Chondroit-Vit C-Mn (GLUCOSAMINE 1500 COMPLEX PO) Take 1 tablet by mouth daily.     . hydrochlorothiazide (HYDRODIURIL) 25 MG tablet Take 25 mg by mouth daily.     . lansoprazole (PREVACID) 15 MG capsule Take 15 mg by mouth every morning.     . meloxicam (MOBIC) 15 MG tablet Take 1 tablet (15 mg total) by mouth daily as needed for pain. 30 tablet 3  . metoprolol succinate (TOPROL-XL) 50 MG 24 hr tablet Take 50 mg by mouth every morning. Take with or immediately following a meal.    . potassium chloride (KLOR-CON) 10 MEQ tablet Take by mouth.    . simvastatin (ZOCOR) 40 MG tablet Take 1 tablet by mouth daily.    . tamoxifen (NOLVADEX) 20 MG tablet TAKE 1 TABLET BY MOUTH  DAILY 90 tablet 1  . venlafaxine XR (EFFEXOR-XR) 75 MG 24 hr capsule TAKE 1 CAPSULE BY MOUTH  DAILY WITH BREAKFAST 90 capsule 1  . Vitamin D, Ergocalciferol,  (DRISDOL) 1.25 MG (50000 UT) CAPS capsule TAKE 1 CAPSULE BY MOUTH  ONCE EVERY WEEK 4 capsule 6  . fluticasone (FLONASE) 50 MCG/ACT nasal spray Place 1 spray into both nostrils daily. (Patient not taking: Reported on 02/04/2019) 16 g 2  . HYDROcodone-acetaminophen (NORCO) 7.5-325 MG tablet Take 1 tablet by mouth 2 (two) times daily as needed for pain.  0   No current facility-administered medications for this visit.    Facility-Administered Medications Ordered in Other Visits  Medication Dose Route Frequency Provider Last Rate Last Dose  . sodium chloride 0.9 % injection 10 mL  10 mL Intravenous PRN Forest Gleason, MD   10 mL at 01/27/15 1416  . sodium chloride flush (NS) 0.9 % injection 10 mL  10 mL Intravenous PRN Cammie Sickle, MD   10 mL at 12/10/15 1347    PHYSICAL EXAMINATION: ECOG PERFORMANCE STATUS: 0 - Asymptomatic  BP 107/70 (BP Location: Left Arm, Patient Position:  Sitting)   Pulse 85   Temp 97.7 F (36.5 C) (Tympanic)   Resp 19   Ht 5' (1.524 m)   Wt 281 lb (127.5 kg)   BMI 54.88 kg/m   Filed Weights   02/04/19 1342  Weight: 281 lb (127.5 kg)   Physical Exam  Constitutional: She is oriented to person, place, and time and well-developed, well-nourished, and in no distress.  Obese. Alone.   HENT:  Head: Normocephalic and atraumatic.  Mouth/Throat: Oropharynx is clear and moist. No oropharyngeal exudate.  Eyes: Pupils are equal, round, and reactive to light.  Neck: Normal range of motion. Neck supple.  Cardiovascular: Normal rate and regular rhythm.  Pulmonary/Chest: Breath sounds normal. No respiratory distress. She has no wheezes.  Abdominal: Soft. Bowel sounds are normal. She exhibits no distension and no mass. There is no abdominal tenderness. There is no rebound and no guarding.  Musculoskeletal: Normal range of motion.        General: No tenderness or edema.  Neurological: She is alert and oriented to person, place, and time.  Skin: Skin is warm.   Psychiatric: Affect normal.       LABORATORY DATA:  I have reviewed the data as listed    Component Value Date/Time   NA 140 02/04/2019 1321   NA 141 06/05/2014 0843   K 3.2 (L) 02/04/2019 1321   K 3.5 06/05/2014 0843   CL 104 02/04/2019 1321   CL 105 06/05/2014 0843   CO2 29 02/04/2019 1321   CO2 27 06/05/2014 0843   GLUCOSE 115 (H) 02/04/2019 1321   GLUCOSE 94 06/05/2014 0843   BUN 18 02/04/2019 1321   BUN 22 (H) 06/05/2014 0843   CREATININE 1.15 (H) 02/04/2019 1321   CREATININE 0.97 06/05/2014 0843   CALCIUM 8.5 (L) 02/04/2019 1321   CALCIUM 9.2 06/05/2014 0843   PROT 6.5 02/04/2019 1321   ALBUMIN 3.7 02/04/2019 1321   AST 25 02/04/2019 1321   ALT 17 02/04/2019 1321   ALKPHOS 47 02/04/2019 1321   BILITOT 0.8 02/04/2019 1321   GFRNONAA 46 (L) 02/04/2019 1321   GFRNONAA 58 (L) 06/05/2014 0843   GFRAA 53 (L) 02/04/2019 1321   GFRAA >60 06/05/2014 0843    No results found for: SPEP, UPEP  Lab Results  Component Value Date   WBC 8.7 02/04/2019   NEUTROABS 4.7 02/04/2019   HGB 13.6 02/04/2019   HCT 41.2 02/04/2019   MCV 95.4 02/04/2019   PLT 176 02/04/2019      Chemistry      Component Value Date/Time   NA 140 02/04/2019 1321   NA 141 06/05/2014 0843   K 3.2 (L) 02/04/2019 1321   K 3.5 06/05/2014 0843   CL 104 02/04/2019 1321   CL 105 06/05/2014 0843   CO2 29 02/04/2019 1321   CO2 27 06/05/2014 0843   BUN 18 02/04/2019 1321   BUN 22 (H) 06/05/2014 0843   CREATININE 1.15 (H) 02/04/2019 1321   CREATININE 0.97 06/05/2014 0843      Component Value Date/Time   CALCIUM 8.5 (L) 02/04/2019 1321   CALCIUM 9.2 06/05/2014 0843   ALKPHOS 47 02/04/2019 1321   AST 25 02/04/2019 1321   ALT 17 02/04/2019 1321   BILITOT 0.8 02/04/2019 1321       RADIOGRAPHIC STUDIES: I have personally reviewed the radiological images as listed and agreed with the findings in the report. No results found.   ASSESSMENT & PLAN:  Carcinoma of overlapping sites of right  breast in female, estrogen receptor positive ([place]) # Breast cancer ER/PR/her 2 Neu-POSITIVE; stage I- on tamoxifen [s/p TAH].  STABLE. July 202-mammo-WNL.   #  No evidence of recurrence. Continue tamoxifen for 5 years [summer 2021].  Given need for previous chemotherapy-extended duration is also reasonable.  Will discuss at further visits.  Continue tamoxifen for now.  # Hot flashes grade 1-stable.  Continue Effexor.   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   # sep 2018- BMD- osteopenia- -1.3;Continue calcium daily. STABLE. Will order BMD at next visit.  # CKD- stage III- creat 1.13-STABLE.   # DISPOSITION:   # follow up in 6 months- MD/labs-cbc/cmp Dr.B   Orders Placed This Encounter  Procedures  . CBC with Differential    Standing Status:   Future    Standing Expiration Date:   02/04/2020  . Comprehensive metabolic panel    Standing Status:   Future    Standing Expiration Date:   02/04/2020   All questions were answered. The patient knows to call the clinic with any problems, questions or concerns.      Cammie Sickle, MD 02/04/2019 3:09 PM

## 2019-02-04 NOTE — Progress Notes (Signed)
Patient reports that her hair is falling out.

## 2019-03-06 ENCOUNTER — Telehealth: Payer: Self-pay | Admitting: *Deleted

## 2019-03-06 NOTE — Telephone Encounter (Signed)
Ok to get vaccine from my stand point. GB

## 2019-03-06 NOTE — Telephone Encounter (Signed)
Patient informed ok from oncology standpoint to get vaccine

## 2019-03-06 NOTE — Telephone Encounter (Signed)
Patient called asking if it is ok for her to get COVID vaccine. Please advise

## 2019-04-18 ENCOUNTER — Other Ambulatory Visit: Payer: Self-pay | Admitting: Internal Medicine

## 2019-06-21 ENCOUNTER — Other Ambulatory Visit: Payer: Self-pay | Admitting: Internal Medicine

## 2019-08-05 ENCOUNTER — Inpatient Hospital Stay: Payer: Medicare Other | Attending: Internal Medicine

## 2019-08-05 ENCOUNTER — Inpatient Hospital Stay (HOSPITAL_BASED_OUTPATIENT_CLINIC_OR_DEPARTMENT_OTHER): Payer: Medicare Other | Admitting: Internal Medicine

## 2019-08-05 ENCOUNTER — Encounter: Payer: Self-pay | Admitting: Internal Medicine

## 2019-08-05 ENCOUNTER — Other Ambulatory Visit: Payer: Self-pay

## 2019-08-05 VITALS — BP 139/73 | HR 81 | Wt 285.0 lb

## 2019-08-05 DIAGNOSIS — Z170 Estrogen receptor positive status [ER+]: Secondary | ICD-10-CM | POA: Insufficient documentation

## 2019-08-05 DIAGNOSIS — Z79810 Long term (current) use of selective estrogen receptor modulators (SERMs): Secondary | ICD-10-CM | POA: Diagnosis not present

## 2019-08-05 DIAGNOSIS — I129 Hypertensive chronic kidney disease with stage 1 through stage 4 chronic kidney disease, or unspecified chronic kidney disease: Secondary | ICD-10-CM | POA: Diagnosis not present

## 2019-08-05 DIAGNOSIS — N951 Menopausal and female climacteric states: Secondary | ICD-10-CM | POA: Insufficient documentation

## 2019-08-05 DIAGNOSIS — Z79811 Long term (current) use of aromatase inhibitors: Secondary | ICD-10-CM | POA: Diagnosis not present

## 2019-08-05 DIAGNOSIS — M898X9 Other specified disorders of bone, unspecified site: Secondary | ICD-10-CM | POA: Diagnosis not present

## 2019-08-05 DIAGNOSIS — C50811 Malignant neoplasm of overlapping sites of right female breast: Secondary | ICD-10-CM | POA: Diagnosis not present

## 2019-08-05 DIAGNOSIS — M8580 Other specified disorders of bone density and structure, unspecified site: Secondary | ICD-10-CM | POA: Diagnosis not present

## 2019-08-05 DIAGNOSIS — N183 Chronic kidney disease, stage 3 unspecified: Secondary | ICD-10-CM | POA: Diagnosis not present

## 2019-08-05 LAB — CBC WITH DIFFERENTIAL/PLATELET
Abs Immature Granulocytes: 0.01 10*3/uL (ref 0.00–0.07)
Basophils Absolute: 0.1 10*3/uL (ref 0.0–0.1)
Basophils Relative: 1 %
Eosinophils Absolute: 0.1 10*3/uL (ref 0.0–0.5)
Eosinophils Relative: 1 %
HCT: 41.7 % (ref 36.0–46.0)
Hemoglobin: 14.1 g/dL (ref 12.0–15.0)
Immature Granulocytes: 0 %
Lymphocytes Relative: 31 %
Lymphs Abs: 2.8 10*3/uL (ref 0.7–4.0)
MCH: 32 pg (ref 26.0–34.0)
MCHC: 33.8 g/dL (ref 30.0–36.0)
MCV: 94.8 fL (ref 80.0–100.0)
Monocytes Absolute: 0.7 10*3/uL (ref 0.1–1.0)
Monocytes Relative: 8 %
Neutro Abs: 5.3 10*3/uL (ref 1.7–7.7)
Neutrophils Relative %: 59 %
Platelets: 177 10*3/uL (ref 150–400)
RBC: 4.4 MIL/uL (ref 3.87–5.11)
RDW: 12.1 % (ref 11.5–15.5)
WBC: 9 10*3/uL (ref 4.0–10.5)
nRBC: 0 % (ref 0.0–0.2)

## 2019-08-05 LAB — COMPREHENSIVE METABOLIC PANEL
ALT: 20 U/L (ref 0–44)
AST: 25 U/L (ref 15–41)
Albumin: 3.8 g/dL (ref 3.5–5.0)
Alkaline Phosphatase: 53 U/L (ref 38–126)
Anion gap: 9 (ref 5–15)
BUN: 23 mg/dL (ref 8–23)
CO2: 29 mmol/L (ref 22–32)
Calcium: 8.8 mg/dL — ABNORMAL LOW (ref 8.9–10.3)
Chloride: 103 mmol/L (ref 98–111)
Creatinine, Ser: 1.34 mg/dL — ABNORMAL HIGH (ref 0.44–1.00)
GFR calc Af Amer: 44 mL/min — ABNORMAL LOW (ref >60)
GFR calc non Af Amer: 38 mL/min — ABNORMAL LOW (ref >60)
Glucose, Bld: 112 mg/dL — ABNORMAL HIGH (ref 70–99)
Potassium: 3.9 mmol/L (ref 3.5–5.1)
Sodium: 141 mmol/L (ref 135–145)
Total Bilirubin: 0.6 mg/dL (ref 0.3–1.2)
Total Protein: 6.8 g/dL (ref 6.5–8.1)

## 2019-08-05 NOTE — Progress Notes (Signed)
[place] OFFICE PROGRESS NOTE  Patient Care Team: [name], MD as PCP - General (Family Medicine) [name], MD as Referring Physician (Family Medicine) Bary Castilla Forest Gleason, MD (General Surgery)  Cancer Staging No matching staging information was found for the patient.   Oncology History Overview Note  1.  Carcinoma of right breast.  Status post lumpectomy and sentinel lymph node evaluation.  Tumor size 1 cm.  Estrogen and progesterone receptor positive.  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  Diagnosis in April of 2016 2.  Accelerated partial breast radiation in June of 2016 3.  Weekly Taxol and Herceptin therapy for adjuvant treatment starting from July of 2016; Finished herceptin July 2017   # 5.Patient has been taken off letrozole because of bony pain and started on tamoxifen 20 mg by mouth daily from May 12, 2015; Tamoxifen [Hx of TAH]  # BMD sep 2018- Osteopenia- 1.3;    DIAGNOSIS: BREAST CA ER/PR/her 2 NEU POSITIVE  STAGE:  I ;GOALS: cure  CURRENT/MOST RECENT THERAPY- [  Tamoxifen]    Carcinoma of overlapping sites of right breast in female, estrogen receptor positive ([place])      INTERVAL HISTORY:  Maria Gutierrez 79 y.o.  female pleasant patient above history of stage I breast cancer ER PR positive HER-2/neu positive on tamoxifen is here for follow-up.  Patient is emotionally upset given the recent loss of her sister and brother-in-law.  Denies any new lumps or bumps.  Denies any nausea vomiting headaches.  Chronic mild joint pains not any worse.   Review of Systems  Constitutional: Negative for chills, diaphoresis, fever, malaise/fatigue and weight loss.  HENT: Negative for nosebleeds and sore throat.   Eyes: Negative for double vision.  Respiratory: Negative for cough, hemoptysis, sputum production, shortness of breath and wheezing.   Cardiovascular: Negative for chest pain, palpitations, orthopnea and leg swelling.  Gastrointestinal: Negative  for abdominal pain, blood in stool, constipation, diarrhea, heartburn, melena, nausea and vomiting.  Genitourinary: Negative for dysuria, frequency and urgency.  Musculoskeletal: Positive for back pain and joint pain.  Skin: Negative.  Negative for itching and rash.  Neurological: Negative for dizziness, tingling, focal weakness, weakness and headaches.  Endo/Heme/Allergies: Does not bruise/bleed easily.  Psychiatric/Behavioral: Negative for depression. The patient is not nervous/anxious and does not have insomnia.       PAST MEDICAL HISTORY :  Past Medical History:  Diagnosis Date  . Anxiety   . Arthritis   . Breast cancer of upper-outer quadrant of right female breast ([place]) 06/10/2014   10 mm invasive mammary carcinoma, T1b,Nx; ER 90%, 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 extensive intermediate grade DCIS.   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 GERD (gastroesophageal reflux disease)   . Heart murmur   . Hyperlipidemia   . Hypertension   . Personal history of chemotherapy 2016   right breast ca  . Personal history of radiation therapy 2016   mammosite  . SOB (shortness of breath) 12/03/2014    PAST SURGICAL HISTORY :   Past Surgical History:  Procedure Laterality Date  . ABDOMINAL HYSTERECTOMY    . AXILLARY LYMPH NODE BIOPSY Right 07/14/2014   Procedure: AXILLARY LYMPH NODE BIOPSY;  Surgeon: Robert Bellow, MD;  Location: [facility] ORS;  Service: General;  Laterality: Right;  . BREAST BIOPSY Left 2012   benign  . BREAST BIOPSY Right 05/26/2014   DCIS   . BREAST EXCISIONAL BIOPSY Right 06/28/2017    ULCERATED SKIN WITH UNDERLYING FAT  NECROSIS, ACUTE INFLAMMATION AND REACTIVE EPITHELIAL ATYPIA  . BREAST LUMPECTOMY Right 06/10/2014   DCIS and Invasive ductal carcinoma, clear margins  . BREAST SURGERY Right 06/10/14   Wide excision for intermediate grade DCIS, identification of a 10  millimeter area of invasive mammary carcinoma.  . CHOLECYSTECTOMY  2012  . COLONOSCOPY   2009  . EYE SURGERY Bilateral 2014  . JOINT REPLACEMENT Left 2001  . JOINT REPLACEMENT Right 2003  . JOINT REPLACEMENT Left 2004   replacement joint broken  . kneee surgery Left   . PORT-A-CATH REMOVAL  02/11/2016   Dr Bary Castilla  . PORTACATH PLACEMENT Left 08/27/2014   Procedure: INSERTION PORT-A-CATH;  Surgeon: Robert Bellow, MD;  Location: [facility] ORS;  Service: General;  Laterality: Left;    FAMILY HISTORY :  No family history on file.  SOCIAL HISTORY:   Social History   Tobacco Use  . Smoking status: Never Smoker  . Smokeless tobacco: Never Used  Substance Use Topics  . Alcohol use: No    Alcohol/week: 0.0 standard drinks  . Drug use: No    ALLERGIES:  is allergic to tape.  MEDICATIONS:  Current Outpatient Medications  Medication Sig Dispense Refill  . aspirin EC 81 MG tablet Take 81 mg by mouth daily.    . calcium carbonate (OS-CAL) 600 MG TABS tablet Take 600 mg by mouth daily.    . cyclobenzaprine (FLEXERIL) 10 MG tablet 1/2-1 po qHS prn    . fluticasone (FLONASE) 50 MCG/ACT nasal spray Place 1 spray into both nostrils daily. 16 g 2  . Glucosamine-Chondroit-Vit C-Mn (GLUCOSAMINE 1500 COMPLEX PO) Take 1 tablet by mouth daily.     [name] HYDROcodone-acetaminophen (NORCO) 7.5-325 MG tablet Take 1 tablet by mouth 2 (two) times daily as needed for pain.  0  . lansoprazole (PREVACID) 15 MG capsule Take 15 mg by mouth every morning.     . meloxicam (MOBIC) 15 MG tablet Take 1 tablet (15 mg total) by mouth daily as needed for pain. 30 tablet 3  . metoprolol succinate (TOPROL-XL) 50 MG 24 hr tablet Take 50 mg by mouth every morning. Take with or immediately following a meal.    . potassium chloride (KLOR-CON) 10 MEQ tablet Take by mouth.    . simvastatin (ZOCOR) 40 MG tablet Take 1 tablet by mouth daily.    . tamoxifen (NOLVADEX) 20 MG tablet TAKE 1 TABLET BY MOUTH  DAILY 90 tablet 3  . venlafaxine XR (EFFEXOR-XR) 75 MG 24 hr capsule TAKE 1 CAPSULE BY MOUTH  DAILY WITH BREAKFAST 90  capsule 3  . Vitamin D, Ergocalciferol, (DRISDOL) 1.25 MG (50000 UNIT) CAPS capsule TAKE 1 CAPSULE BY MOUTH  ONCE WEEKLY 12 capsule 0  . hydrochlorothiazide (HYDRODIURIL) 25 MG tablet Take 25 mg by mouth daily.      No current facility-administered medications for this visit.   Facility-Administered Medications Ordered in Other Visits  Medication Dose Route Frequency Provider Last Rate Last Admin  . sodium chloride 0.9 % injection 10 mL  10 mL Intravenous PRN Choksi, Janak, MD   10 mL at 01/27/15 1416  . sodium chloride flush (NS) 0.9 % injection 10 mL  10 mL Intravenous PRN Cammie Sickle, MD   10 mL at 12/10/15 1347    PHYSICAL EXAMINATION: ECOG PERFORMANCE STATUS: 0 - Asymptomatic  BP 139/73   Pulse 81   Wt 285 lb (129.3 kg)   BMI 55.66 kg/m   Filed Weights   08/05/19 1431  Weight: 285  lb (129.3 kg)   Physical Exam  Constitutional: She is oriented to person, place, and time and well-developed, well-nourished, and in no distress.  Obese. Alone.   HENT:  Head: Normocephalic and atraumatic.  Mouth/Throat: Oropharynx is clear and moist. No oropharyngeal exudate.  Eyes: Pupils are equal, round, and reactive to light.  Cardiovascular: Normal rate and regular rhythm.  Pulmonary/Chest: Breath sounds normal. No respiratory distress. She has no wheezes.  Abdominal: Soft. Bowel sounds are normal. She exhibits no distension and no mass. There is no abdominal tenderness. There is no rebound and no guarding.  Musculoskeletal:        General: No tenderness or edema. Normal range of motion.     Cervical back: Normal range of motion and neck supple.  Neurological: She is alert and oriented to person, place, and time.  Skin: Skin is warm.  Psychiatric: Affect normal.       LABORATORY DATA:  I have reviewed the data as listed    Component Value Date/Time   NA 141 08/05/2019 1315   NA 141 06/05/2014 0843   K 3.9 08/05/2019 1315   K 3.5 06/05/2014 0843   CL 103 08/05/2019  1315   CL 105 06/05/2014 0843   CO2 29 08/05/2019 1315   CO2 27 06/05/2014 0843   GLUCOSE 112 (H) 08/05/2019 1315   GLUCOSE 94 06/05/2014 0843   BUN 23 08/05/2019 1315   BUN 22 (H) 06/05/2014 0843   CREATININE 1.34 (H) 08/05/2019 1315   CREATININE 0.97 06/05/2014 0843   CALCIUM 8.8 (L) 08/05/2019 1315   CALCIUM 9.2 06/05/2014 0843   PROT 6.8 08/05/2019 1315   ALBUMIN 3.8 08/05/2019 1315   AST 25 08/05/2019 1315   ALT 20 08/05/2019 1315   ALKPHOS 53 08/05/2019 1315   BILITOT 0.6 08/05/2019 1315   GFRNONAA 38 (L) 08/05/2019 1315   GFRNONAA 58 (L) 06/05/2014 0843   GFRAA 44 (L) 08/05/2019 1315   GFRAA >60 06/05/2014 0843    No results found for: SPEP, UPEP  Lab Results  Component Value Date   WBC 9.0 08/05/2019   NEUTROABS 5.3 08/05/2019   HGB 14.1 08/05/2019   HCT 41.7 08/05/2019   MCV 94.8 08/05/2019   PLT 177 08/05/2019      Chemistry      Component Value Date/Time   NA 141 08/05/2019 1315   NA 141 06/05/2014 0843   K 3.9 08/05/2019 1315   K 3.5 06/05/2014 0843   CL 103 08/05/2019 1315   CL 105 06/05/2014 0843   CO2 29 08/05/2019 1315   CO2 27 06/05/2014 0843   BUN 23 08/05/2019 1315   BUN 22 (H) 06/05/2014 0843   CREATININE 1.34 (H) 08/05/2019 1315   CREATININE 0.97 06/05/2014 0843      Component Value Date/Time   CALCIUM 8.8 (L) 08/05/2019 1315   CALCIUM 9.2 06/05/2014 0843   ALKPHOS 53 08/05/2019 1315   AST 25 08/05/2019 1315   ALT 20 08/05/2019 1315   BILITOT 0.6 08/05/2019 1315       RADIOGRAPHIC STUDIES: I have personally reviewed the radiological images as listed and agreed with the findings in the report. No results found.   ASSESSMENT & PLAN:  Carcinoma of overlapping sites of right breast in female, estrogen receptor positive ([place]) # Breast cancer ER/PR/her 2 Neu-POSITIVE; stage I- on tamoxifen [s/p TAH].  STABLE. July 2020-mammo-WNL.   #  No evidence of recurrence. Continue tamoxifen for 5 years [summer 2021].  Given need for previous  chemotherapy-extended duration is also reasonable.  Will discuss at further visits.  Continue tamoxifen for now.  # Hot flashes grade 1-stable.  Continue Effexor.   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   # sep 2018- BMD- osteopenia- -1.3;Continue calcium daily. STABLE. Will order BMD prior to next visit.   # CKD- stage III- creat 1.3; STABLE; ?meloxicam; recommend Tylenol prn.   # DISPOSITION:   # follow up in 6 months- MD/labs-cbc/cmp; BMD prior-  Dr.B   Orders Placed This Encounter  Procedures  . DG Bone Density    Standing Status:   Future    Standing Expiration Date:   08/04/2020    Order Specific Question:   Reason for Exam (SYMPTOM  OR DIAGNOSIS REQUIRED)    Answer:   aromatase inhibitor    Order Specific Question:   Preferred imaging location?    Answer:   [facility]   All questions were answered. The patient knows to call the clinic with any problems, questions or concerns.      Cammie Sickle, MD 08/11/2019 7:06 PM

## 2019-08-05 NOTE — Assessment & Plan Note (Addendum)
#   Breast cancer ER/PR/her 2 Neu-POSITIVE; stage I- on tamoxifen [s/p TAH].  STABLE. July 2020-mammo-WNL.   #  No evidence of recurrence. Continue tamoxifen for 5 years [summer 2021].  Given need for previous chemotherapy-extended duration is also reasonable.  Will discuss at further visits.  Continue tamoxifen for now.  # Hot flashes grade 1-stable.  Continue Effexor.   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   # sep 2018- BMD- osteopenia- -1.3;Continue calcium daily. STABLE. Will order BMD prior to next visit.   # CKD- stage III- creat 1.3; STABLE; ?meloxicam; recommend Tylenol prn.   # DISPOSITION:   # follow up in 6 months- MD/labs-cbc/cmp; BMD prior-  Dr.B

## 2019-08-13 ENCOUNTER — Other Ambulatory Visit: Payer: Self-pay | Admitting: General Surgery

## 2019-08-13 DIAGNOSIS — C50811 Malignant neoplasm of overlapping sites of right female breast: Secondary | ICD-10-CM

## 2019-08-21 ENCOUNTER — Other Ambulatory Visit: Payer: Self-pay | Admitting: Internal Medicine

## 2019-09-16 ENCOUNTER — Ambulatory Visit
Admission: RE | Admit: 2019-09-16 | Discharge: 2019-09-16 | Disposition: A | Discharge status: Home / Self Care | Payer: Medicare Other | Source: Ambulatory Visit | Attending: General Surgery | Admitting: General Surgery

## 2019-09-16 DIAGNOSIS — Z170 Estrogen receptor positive status [ER+]: Secondary | ICD-10-CM | POA: Diagnosis present

## 2019-09-16 DIAGNOSIS — C50811 Malignant neoplasm of overlapping sites of right female breast: Secondary | ICD-10-CM | POA: Diagnosis not present

## 2019-09-30 NOTE — Progress Notes (Signed)
10/01/2019 5:08 PM   Maria Gutierrez [birth year] [id number]  Referring provider: [name], MD [address] [facility] [place],  [place] [address] Chief Complaint  Patient presents with  . Dysuria    HPI: Maria Gutierrez is a 79 y.o. female who presents today for evaluation management of dysuria.  The patient was present to Dr. Kary Kos on 07/13/52021 for dysuria. UA was negative. Urine culture grew mixed urogenital flora 25,000 - 50,000 colonies/mL.   The patient has a personal history of right breast cancer diagnosed in 05/2014. She is s/p lumpectomy and sentinel lymph node evaluation. She is ER and PR positive. She is HER-2/neu receptor positive. She had partial breast radiation is June of 2016. She received weekly Taxol and herceptin therapy for adjuvant treatment staring from 08/2014; finished herceptin in 08/2015.   She has been taken off letrozole because of bony pain and place of Tamoxifen 20 mg daily on 05/12/2015.   She was evaluated for urinary symptoms x 5 years ago with Maria Council, PA-C.  Although the referral says that she is here for dysuria, she denies this adamantly today.  She denies any burning with urination.  She wants to discuss urinary incontinence.  She has been wearing pad x 4-5 daily that are all saturated. She has issues with constipation and dry eyes. She reports having rare and frequent UTIs. She leaks with laughing, coughing and sneezing. She has a lot of day time urgency and urinary symptoms. When away from home she toilet maps.    PMH: Past Medical History:  Diagnosis Date  . Anxiety   . Arthritis   . Breast cancer of upper-outer quadrant of right female breast ([place]) 06/10/2014   10 mm invasive mammary carcinoma, T1b,Nx; ER 90%, 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 extensive intermediate grade DCIS.   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 GERD (gastroesophageal reflux disease)   . Heart murmur   . Hyperlipidemia   .  Hypertension   . Personal history of chemotherapy 2016   right breast ca  . Personal history of radiation therapy 2016   mammosite  . SOB (shortness of breath) 12/03/2014    Surgical History: Past Surgical History:  Procedure Laterality Date  . ABDOMINAL HYSTERECTOMY    . AXILLARY LYMPH NODE BIOPSY Right 07/14/2014   Procedure: AXILLARY LYMPH NODE BIOPSY;  Surgeon: Robert Bellow, MD;  Location: [facility] ORS;  Service: General;  Laterality: Right;  . BREAST BIOPSY Left 2012   benign  . BREAST BIOPSY Right 05/26/2014   DCIS   . BREAST EXCISIONAL BIOPSY Right 06/28/2017    ULCERATED SKIN WITH UNDERLYING FAT NECROSIS, ACUTE INFLAMMATION AND REACTIVE EPITHELIAL ATYPIA  . BREAST LUMPECTOMY Right 06/10/2014   DCIS and Invasive ductal carcinoma, clear margins  . BREAST SURGERY Right 06/10/14   Wide excision for intermediate grade DCIS, identification of a 10  millimeter area of invasive mammary carcinoma.  . CHOLECYSTECTOMY  2012  . COLONOSCOPY  2009  . EYE SURGERY Bilateral 2014  . JOINT REPLACEMENT Left 2001  . JOINT REPLACEMENT Right 2003  . JOINT REPLACEMENT Left 2004   replacement joint broken  . kneee surgery Left   . PORT-A-CATH REMOVAL  02/11/2016   Dr Bary Castilla  . PORTACATH PLACEMENT Left 08/27/2014   Procedure: INSERTION PORT-A-CATH;  Surgeon: Robert Bellow, MD;  Location: [facility] ORS;  Service: General;  Laterality: Left;    Home Medications:  Allergies as of 10/01/2019  Reactions   Tape Other (See Comments)   Blisters on skin from tape during recent surgery.      Medication List       Accurate as of October 01, 2019 11:59 PM. If you have any questions, ask your nurse or doctor.        aspirin EC 81 MG tablet Take 81 mg by mouth daily.   calcium carbonate 600 MG Tabs tablet Commonly known as: OS-CAL Take 600 mg by mouth daily.   cyclobenzaprine 10 MG tablet Commonly known as: FLEXERIL 1/2-1 po qHS prn   fluticasone 50 MCG/ACT nasal spray Commonly known  as: Flonase Place 1 spray into both nostrils daily.   GLUCOSAMINE 1500 COMPLEX PO Take 1 tablet by mouth daily.   hydrochlorothiazide 25 MG tablet Commonly known as: HYDRODIURIL Take 25 mg by mouth daily.   HYDROcodone-acetaminophen 7.5-325 MG tablet Commonly known as: NORCO Take 1 tablet by mouth 2 (two) times daily as needed for pain.   lansoprazole 15 MG capsule Commonly known as: PREVACID Take 15 mg by mouth every morning.   meloxicam 15 MG tablet Commonly known as: MOBIC Take 1 tablet (15 mg total) by mouth daily as needed for pain.   metoprolol succinate 50 MG 24 hr tablet Commonly known as: TOPROL-XL Take 50 mg by mouth every morning. Take with or immediately following a meal.   potassium chloride 10 MEQ tablet Commonly known as: KLOR-CON Take by mouth.   simvastatin 40 MG tablet Commonly known as: ZOCOR Take 1 tablet by mouth daily.   tamoxifen 20 MG tablet Commonly known as: NOLVADEX TAKE 1 TABLET BY MOUTH  DAILY   venlafaxine XR 75 MG 24 hr capsule Commonly known as: EFFEXOR-XR TAKE 1 CAPSULE BY MOUTH  DAILY WITH BREAKFAST   Vitamin D (Ergocalciferol) 1.25 MG (50000 UNIT) Caps capsule Commonly known as: DRISDOL TAKE 1 CAPSULE BY MOUTH  ONCE WEEKLY       Allergies:  Allergies  Allergen Reactions  . Tape Other (See Comments)    Blisters on skin from tape during recent surgery.    Family History: Family History  Problem Relation Age of Onset  . Breast cancer Neg Hx     Social History:  reports that she has never smoked. She has never used smokeless tobacco. She reports that she does not drink alcohol and does not use drugs.   Physical Exam: BP 122/77   Pulse 90   Wt 285 lb (129.3 kg)   BMI 55.66 kg/m   Constitutional:  Alert and oriented, No acute distress. HEENT: [place] AT, moist mucus membranes.  Trachea midline, no masses. Cardiovascular: No clubbing, cyanosis, or edema. Respiratory: Normal respiratory effort, no increased work of  breathing. Skin: No rashes, bruises or suspicious lesions. Neurologic: Grossly intact, no focal deficits, moving all 4 extremities. Psychiatric: Normal mood and affect.  Laboratory Data:  Lab Results  Component Value Date   CREATININE 1.34 (H) 08/05/2019    Urinalysis Negative   Assessment & Plan:    1. Stress incontinence I discussed behavioral modifications and encouraged pelvic floor exercise. Suggested a referral to PT vs surgical intervention. Patient would like to hold off for now.  Instructions for pelvic floor exercises given to the patient today  2. OAB/urge incontinence  Previously tried and failed Myrbetriq 50 mg She is never previously tried any anticholinergics, will start a trial of this today Suggested PTNS vs botox since the patient is a good candidate.  Patient was provided with [facility]  literature.  Discussed risk/benefits of anticoagulants.  -Toviaz Rx sent   F/u in 1 month.   [place] [address] [place], [place] [address] [phone]  I, Selena Batten, am acting as a scribe for Dr. Hollice Espy.  I have reviewed the above documentation for accuracy and completeness, and I agree with the above.   Hollice Espy, MD  I spent 45 total minutes on the day of the encounter including pre-visit review of the medical record, face-to-face time with the patient, and post visit ordering of labs/imaging/tests.

## 2019-10-01 ENCOUNTER — Other Ambulatory Visit: Payer: Self-pay

## 2019-10-01 ENCOUNTER — Ambulatory Visit (INDEPENDENT_AMBULATORY_CARE_PROVIDER_SITE_OTHER): Payer: Medicare Other | Admitting: Urology

## 2019-10-01 VITALS — BP 122/77 | HR 90 | Wt 285.0 lb

## 2019-10-01 DIAGNOSIS — R319 Hematuria, unspecified: Secondary | ICD-10-CM

## 2019-10-03 LAB — URINALYSIS, COMPLETE
Bilirubin, UA: NEGATIVE
Glucose, UA: NEGATIVE
Ketones, UA: NEGATIVE
Leukocytes,UA: NEGATIVE
Nitrite, UA: NEGATIVE
Protein,UA: NEGATIVE
RBC, UA: NEGATIVE
Specific Gravity, UA: 1.02 (ref 1.005–1.030)
Urobilinogen, Ur: 0.2 mg/dL (ref 0.2–1.0)
pH, UA: 6.5 (ref 5.0–7.5)

## 2019-10-03 LAB — MICROSCOPIC EXAMINATION: RBC, Urine: NONE SEEN /hpf (ref 0–2)

## 2019-11-11 NOTE — Progress Notes (Signed)
11/12/2019 2:17 PM   Maria Gutierrez 1940/11/09 [id number]  Referring provider: [name], MD [address] [facility] [place],  [place] [address] Chief Complaint  Patient presents with  . Over Active Bladder    67mo follow up    HPI: Maria Gutierrez is a 79 y.o. female who returns for a 1 month follow up of stress incontinence and OAB/urge incontinence.   Since last visit, she tried Toviaz 4 mg for a month.  She did see about a 25% improvement in her daytime urinary symptoms including decreased episodes of incontinence.  She is still fairly symptomatic.  She previously tried and failed Myrbetriq 50 mg.  Her nocturia has improved and she is sleeping through the night. Her day time symptoms are steadily improving.   She did some reading on treatments for refractory symptoms including Botox and is very interested in this.   PMH: Past Medical History:  Diagnosis Date  . Anxiety   . Arthritis   . Breast cancer of upper-outer quadrant of right female breast ([facility]) 06/10/2014   10 mm invasive mammary carcinoma, T1b,Nx; ER 90%, 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 extensive intermediate grade DCIS.   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 GERD (gastroesophageal reflux disease)   . Heart murmur   . Hyperlipidemia   . Hypertension   . Personal history of chemotherapy 2016   right breast ca  . Personal history of radiation therapy 2016   mammosite  . SOB (shortness of breath) 12/03/2014    Surgical History: Past Surgical History:  Procedure Laterality Date  . ABDOMINAL HYSTERECTOMY    . AXILLARY LYMPH NODE BIOPSY Right 07/14/2014   Procedure: AXILLARY LYMPH NODE BIOPSY;  Surgeon: Robert Bellow, MD;  Location: [facility] ORS;  Service: General;  Laterality: Right;  . BREAST BIOPSY Left 2012   benign  . BREAST BIOPSY Right 05/26/2014   DCIS   . BREAST EXCISIONAL BIOPSY Right 06/28/2017    ULCERATED SKIN WITH UNDERLYING FAT NECROSIS, ACUTE INFLAMMATION AND  REACTIVE EPITHELIAL ATYPIA  . BREAST LUMPECTOMY Right 06/10/2014   DCIS and Invasive ductal carcinoma, clear margins  . BREAST SURGERY Right 06/10/14   Wide excision for intermediate grade DCIS, identification of a 10  millimeter area of invasive mammary carcinoma.  . CHOLECYSTECTOMY  2012  . COLONOSCOPY  2009  . EYE SURGERY Bilateral 2014  . JOINT REPLACEMENT Left 2001  . JOINT REPLACEMENT Right 2003  . JOINT REPLACEMENT Left 2004   replacement joint broken  . kneee surgery Left   . PORT-A-CATH REMOVAL  02/11/2016   Dr Bary Castilla  . PORTACATH PLACEMENT Left 08/27/2014   Procedure: INSERTION PORT-A-CATH;  Surgeon: Robert Bellow, MD;  Location: [facility] ORS;  Service: General;  Laterality: Left;    Home Medications:  Allergies as of 11/12/2019      Reactions   Tape Other (See Comments)   Blisters on skin from tape during recent surgery.      Medication List       Accurate as of November 12, 2019  2:17 PM. If you have any questions, ask your nurse or doctor.        aspirin EC 81 MG tablet Take 81 mg by mouth daily.   calcium carbonate 600 MG Tabs tablet Commonly known as: OS-CAL Take 600 mg by mouth daily.   cyanocobalamin 1000 MCG tablet Take by mouth.   cyclobenzaprine 10 MG tablet Commonly known as: FLEXERIL 1/2-1 po qHS  prn   fluticasone 50 MCG/ACT nasal spray Commonly known as: Flonase Place 1 spray into both nostrils daily.   GLUCOSAMINE 1500 COMPLEX PO Take 1 tablet by mouth daily.   hydrochlorothiazide 25 MG tablet Commonly known as: HYDRODIURIL Take 25 mg by mouth daily.   HYDROcodone-acetaminophen 7.5-325 MG tablet Commonly known as: NORCO Take 1 tablet by mouth 2 (two) times daily as needed for pain.   lansoprazole 15 MG capsule Commonly known as: PREVACID Take 15 mg by mouth every morning.   Magnesium Oxide 500 MG Tabs Take by mouth.   meloxicam 15 MG tablet Commonly known as: MOBIC Take 1 tablet (15 mg total) by mouth daily as needed for  pain.   metoprolol succinate 50 MG 24 hr tablet Commonly known as: TOPROL-XL Take 50 mg by mouth every morning. Take with or immediately following a meal.   potassium chloride 10 MEQ tablet Commonly known as: KLOR-CON Take by mouth.   simvastatin 40 MG tablet Commonly known as: ZOCOR Take 1 tablet by mouth daily.   tamoxifen 20 MG tablet Commonly known as: NOLVADEX TAKE 1 TABLET BY MOUTH  DAILY   venlafaxine XR 75 MG 24 hr capsule Commonly known as: EFFEXOR-XR TAKE 1 CAPSULE BY MOUTH  DAILY WITH BREAKFAST   Vitamin D (Ergocalciferol) 1.25 MG (50000 UNIT) Caps capsule Commonly known as: DRISDOL TAKE 1 CAPSULE BY MOUTH  ONCE WEEKLY       Allergies:  Allergies  Allergen Reactions  . Tape Other (See Comments)    Blisters on skin from tape during recent surgery.    Family History: Family History  Problem Relation Age of Onset  . Breast cancer Neg Hx     Social History:  reports that she has never smoked. She has never used smokeless tobacco. She reports that she does not drink alcohol and does not use drugs.   Physical Exam: BP 118/65   Pulse (!) 108   Ht 5\' 3"  (1.6 m)   Wt 281 lb (127.5 kg)   BMI 49.78 kg/m   Constitutional:  Alert and oriented, No acute distress. HEENT: [place] AT, moist mucus membranes.  Trachea midline, no masses. Cardiovascular: No clubbing, cyanosis, or edema. Respiratory: Normal respiratory effort, no increased work of breathing. Skin: No rashes, bruises or suspicious lesions. Neurologic: Grossly intact, no focal deficits, moving all 4 extremities. Psychiatric: Normal mood and affect.  Laboratory Data:  Lab Results  Component Value Date   CREATININE 1.34 (H) 08/05/2019    Assessment & Plan:    1. Stress incontinence Discussed behavorial modifications.  Significantly more bothered by #2  2. OAB/urge incontinence  Patient tried Lisbeth Ply and symptoms have improved mildly I will increase dose of Toviaz x 1 month, if symptoms do not  significantly improve I will proceed with botox.  We discussed risk of Botox at length including risk of retention, bleeding, infection amongst others.  She has previously provided literature on this.  If her symptoms are not satisfactory on Toviaz, she like to pursue Botox.  She will let us know the end of this trial whether or not she wants to pursue this and we will arrange it without having her come back to the office to void another visit. - Trial Toviaz Rx sent.    [facility] [address], [place], [place] [address] [phone]  I, Selena Batten, am acting as a scribe for Dr. Hollice Espy.  I have reviewed the above documentation for accuracy and completeness, and  I agree with the above.   Hollice Espy, MD

## 2019-11-12 ENCOUNTER — Other Ambulatory Visit: Payer: Self-pay

## 2019-11-12 ENCOUNTER — Ambulatory Visit: Payer: Medicare Other | Admitting: Urology

## 2019-11-12 ENCOUNTER — Encounter: Payer: Self-pay | Admitting: Urology

## 2019-11-12 VITALS — BP 118/65 | HR 108 | Ht 63.0 in | Wt 281.0 lb

## 2019-11-12 DIAGNOSIS — N3941 Urge incontinence: Secondary | ICD-10-CM | POA: Diagnosis not present

## 2019-11-12 NOTE — Patient Instructions (Signed)
Take two tablets of Toviaz 4mg  once daily and call in 3 weeks with an update on how well medication is working. If working well a prescription can then be sent to your pharmacy

## 2019-12-26 ENCOUNTER — Telehealth: Payer: Self-pay | Admitting: Urology

## 2019-12-26 ENCOUNTER — Other Ambulatory Visit: Payer: Self-pay | Admitting: Radiology

## 2019-12-26 NOTE — Telephone Encounter (Signed)
Please read my last note.  She would like to proceed with Botox.  Amy, please call her and schedule.  Hollice Espy, MD

## 2019-12-26 NOTE — Telephone Encounter (Signed)
Pt calling to report efficacy of medication given at last appt. Lisbeth Ply) Pt states it works some but is not great. Would like to proceed with the "shot" (??) that was dicussed at last appt. Please advise.

## 2019-12-31 ENCOUNTER — Other Ambulatory Visit: Payer: Self-pay | Admitting: Radiology

## 2019-12-31 DIAGNOSIS — N3281 Overactive bladder: Secondary | ICD-10-CM

## 2019-12-31 DIAGNOSIS — N3941 Urge incontinence: Secondary | ICD-10-CM

## 2019-12-31 MED ORDER — ONABOTULINUMTOXINA 100 UNITS IJ SOLR: 100.0000 [IU] | INTRAMUSCULAR | Status: AC

## 2020-01-02 ENCOUNTER — Other Ambulatory Visit: Payer: Self-pay | Admitting: *Deleted

## 2020-01-02 DIAGNOSIS — N3941 Urge incontinence: Secondary | ICD-10-CM

## 2020-01-03 ENCOUNTER — Other Ambulatory Visit: Payer: Self-pay

## 2020-01-03 ENCOUNTER — Other Ambulatory Visit: Payer: Medicare Other

## 2020-01-03 DIAGNOSIS — N3941 Urge incontinence: Secondary | ICD-10-CM

## 2020-01-03 LAB — URINALYSIS, COMPLETE
Bilirubin, UA: NEGATIVE
Glucose, UA: NEGATIVE
Ketones, UA: NEGATIVE
Leukocytes,UA: NEGATIVE
Nitrite, UA: NEGATIVE
Protein,UA: NEGATIVE
RBC, UA: NEGATIVE
Specific Gravity, UA: 1.02 (ref 1.005–1.030)
Urobilinogen, Ur: 0.2 mg/dL (ref 0.2–1.0)
pH, UA: 6.5 (ref 5.0–7.5)

## 2020-01-03 LAB — MICROSCOPIC EXAMINATION: Bacteria, UA: NONE SEEN

## 2020-01-07 ENCOUNTER — Other Ambulatory Visit: Payer: Self-pay

## 2020-01-07 ENCOUNTER — Other Ambulatory Visit
Admission: RE | Admit: 2020-01-07 | Discharge: 2020-01-07 | Disposition: A | Discharge status: Home / Self Care | Payer: Medicare Other | Source: Ambulatory Visit | Attending: Urology | Admitting: Urology

## 2020-01-07 HISTORY — DX: Other intervertebral disc degeneration, lumbar region: M51.36

## 2020-01-07 HISTORY — DX: Constipation, unspecified: K59.00

## 2020-01-07 HISTORY — DX: Personal history of other medical treatment: Z92.89

## 2020-01-07 HISTORY — DX: Anemia, unspecified: D64.9

## 2020-01-07 HISTORY — DX: Other intervertebral disc degeneration, lumbar region without mention of lumbar back pain or lower extremity pain: M51.369

## 2020-01-07 HISTORY — DX: Dyspnea, unspecified: R06.00

## 2020-01-07 LAB — CULTURE, URINE COMPREHENSIVE

## 2020-01-07 NOTE — Patient Instructions (Addendum)
INSTRUCTIONS FOR SURGERY     Your surgery is scheduled for:   Monday, November 15TH     To find out your arrival time for the day of surgery,          please call [phone] between 1 pm and 3 pm on :  Friday, November 12TH     When you arrive for surgery, report to the [facility].    YOU WILL THEN GO TO THE SECOND FLOOR AND SIGN IN AT SURGICAL DESK.    REMEMBER: Instructions that are not followed completely may result in serious medical risk,  up to and including death, or upon the discretion of your surgeon and anesthesiologist,            your surgery may need to be rescheduled.  __X__ 1. Do not eat food after midnight the night before your procedure.                    No gum, candy, lozenger, tic tacs, tums or hard candies.                  ABSOLUTELY NOTHING SOLID IN YOUR MOUTH AFTER MIDNIGHT                    You may drink unlimited clear liquids up to 2 hours before you are scheduled to arrive for surgery.                   Do not drink anything within those 2 hours unless you need to take medicine, then take the                   smallest amount you need.  Clear liquids include:  water, apple juice without pulp,                   any flavor Gatorade, Black coffee, black tea.  Sugar may be added but no dairy/ honey /lemon.                        Broth and jello is not considered a clear liquid.  __x__  2. On the morning of surgery, please brush your teeth with toothpaste and water. You may rinse with                  mouthwash if you wish but DO NOT SWALLOW TOOTHPASTE OR MOUTHWASH  __X___3. NO alcohol for 24 hours before or after surgery.  __x___ 4.  Do NOT smoke or use e-cigarettes for 24 HOURS PRIOR TO SURGERY.                      DO NOT Use any chewable tobacco products for at least 6 hours prior to surgery.  __x___ 5. If you start any new medication after this appointment and prior to  surgery, please                   Bring it with you on the day of surgery.  ___x__ 6. Notify your doctor if there is any change  in your medical condition, such as fever,                   infection, vomitting, diarrhea or any open sores.  __x___ 7.  USE ANTIBACTERIAL SOAP as instructed, the night before surgery OR the day of surgery.                   Once you have washed with this soap, do NOT use any of the following: Powders, perfumes                    or lotions. Please do not wear make up, hairpins, clips or nail polish. You MAY wear deodorant.                   Men may shave their face and neck.  Women need to shave 48 hours prior to surgery.                   DO NOT wear ANY jewelry on the day of surgery. If there are rings that are too tight to                    remove easily, please address this prior to the surgery day. Piercings need to be removed.                                                                     NO METAL ON YOUR BODY.                    Do NOT bring any valuables.  If you came to Pre-Admit testing then you will not need license,                     insurance card or credit card.  If you will be staying overnight, please either leave your things in                     the car or have your family be responsible for these items.                      [facility] IS NOT RESPONSIBLE FOR BELONGINGS OR VALUABLES.  ___X__ 8. DO NOT wear contact lenses on surgery day.  You may not have dentures,                     Hearing aides, contacts or glasses in the operating room. These items can be                    Placed in the Recovery Room to receive immediately after surgery.  __x___ 9. IF YOU ARE SCHEDULED TO GO HOME ON THE SAME DAY, YOU MUST                   Have someone to drive you home and to stay with you  for the first 24 hours.                    Have an arrangement prior to arriving on surgery day.  ___x__ 10. Take the following medications  on the morning of  surgery with a sip of water:                              1. METOPROLOL                     2. TAMOXIFEN                     3. EFFEXOR                     4. ALLEGRA, if needed                     5. PREVACID (take an extra dose the night before surgery also)                     6. NORCO, if later case and if needed  _____ 11.  Follow any instructions provided to you by your surgeon.                        Such as enema, clear liquid bowel prep  __X__  12. STOP ALL ASPIRIN PRODUCTS AS OF TODAY 01/07/20.                       THIS INCLUDES BC POWDERS / GOODIES POWDER  __x___ 13. STOP Anti-inflammatories as of TODAY, 01/07/20.                      This includes IBUPROFEN / MOTRIN / ADVIL / ALEVE/ NAPROXYN                    YOU MAY TAKE TYLENOL ANY TIME PRIOR TO SURGERY.  ___X__ 88.  Stop supplements until after surgery.                     This includes: CALCIUM // GLUCOSAMINE // MAGNESIUM // MULTIVITAMINS                  You may continue taking Vitamin D3 but do not take on the morning of surgery.  __X____17.  Continue to take the following medications but do not take on the morning of surgery:                          HCTZ(HYDRODIURIL) // KLOR CON  __X_____18.   Wear clean and comfortable clothing to the hospital.  CONTINUE TAKING YOUR NIGHT TIME MEDICATIONS AS USUAL.

## 2020-01-09 ENCOUNTER — Other Ambulatory Visit: Payer: Self-pay

## 2020-01-09 ENCOUNTER — Encounter
Admission: RE | Admit: 2020-01-09 | Discharge: 2020-01-09 | Disposition: A | Discharge status: Home / Self Care | Payer: Medicare Other | Source: Ambulatory Visit | Attending: Urology | Admitting: Urology

## 2020-01-09 DIAGNOSIS — Z20822 Contact with and (suspected) exposure to covid-19: Secondary | ICD-10-CM | POA: Insufficient documentation

## 2020-01-09 DIAGNOSIS — I10 Essential (primary) hypertension: Secondary | ICD-10-CM | POA: Diagnosis not present

## 2020-01-09 DIAGNOSIS — Z01818 Encounter for other preprocedural examination: Secondary | ICD-10-CM | POA: Diagnosis not present

## 2020-01-09 LAB — POTASSIUM: Potassium: 3.1 mmol/L — ABNORMAL LOW (ref 3.5–5.1)

## 2020-01-09 NOTE — Progress Notes (Signed)
°  [facility] Perioperative Services: Pre-Admission/Anesthesia Testing  Abnormal Lab Notification   Date: 01/09/20  Name: Maria Gutierrez MRN:   [id number]  Re: Abnormal labs noted during PAT appointment   Provider(s) Notified: Hollice Espy, MD Notification mode: Routed and/or faxed via [facility]   ABNORMAL LAB VALUE(S): Lab Results  Component Value Date   K 3.1 (L) 01/09/2020    Notes:  Patient is scheduled for a Botox injection on 01/13/2020.    Patient on daily HCTZ 25 mg dose.  She is already taking oral KCl supplement (10 mEq) daily.   BUN 23 and creatinine 1.34 mg/dL on 08/05/2019.  Sending to surgeon for review and consideration of need for optimization prior to her procedure on 01/13/2020.  Will place order to have SDS staff recheck K+ level on the day of surgery to ensure safe to proceed from an anesthesia perspective.   This is a Community education officer; no formal response is required.  Honor Loh, MSN, APRN, FNP-C, CEN [facility]  Peri-operative Services Nurse Practitioner Phone: [phone] 01/09/20 1:43 PM

## 2020-01-10 LAB — SARS CORONAVIRUS 2 (TAT 6-24 HRS): SARS Coronavirus 2: NEGATIVE

## 2020-01-13 ENCOUNTER — Ambulatory Visit: Payer: Medicare Other | Admitting: Urgent Care

## 2020-01-13 ENCOUNTER — Ambulatory Visit
Admission: RE | Admit: 2020-01-13 | Discharge: 2020-01-13 | Disposition: A | Discharge status: Home / Self Care | Payer: Medicare Other | Attending: Urology | Admitting: Urology

## 2020-01-13 ENCOUNTER — Encounter
Admission: RE | Disposition: A | Discharge status: Home / Self Care | Payer: Self-pay | Source: Home / Self Care | Attending: Urology

## 2020-01-13 ENCOUNTER — Other Ambulatory Visit: Payer: Self-pay

## 2020-01-13 ENCOUNTER — Encounter: Payer: Self-pay | Admitting: Urology

## 2020-01-13 DIAGNOSIS — Z79810 Long term (current) use of selective estrogen receptor modulators (SERMs): Secondary | ICD-10-CM | POA: Diagnosis not present

## 2020-01-13 DIAGNOSIS — Z923 Personal history of irradiation: Secondary | ICD-10-CM | POA: Insufficient documentation

## 2020-01-13 DIAGNOSIS — Z91048 Other nonmedicinal substance allergy status: Secondary | ICD-10-CM | POA: Diagnosis not present

## 2020-01-13 DIAGNOSIS — I10 Essential (primary) hypertension: Secondary | ICD-10-CM | POA: Diagnosis not present

## 2020-01-13 DIAGNOSIS — Z9049 Acquired absence of other specified parts of digestive tract: Secondary | ICD-10-CM | POA: Insufficient documentation

## 2020-01-13 DIAGNOSIS — Z9221 Personal history of antineoplastic chemotherapy: Secondary | ICD-10-CM | POA: Insufficient documentation

## 2020-01-13 DIAGNOSIS — Z79899 Other long term (current) drug therapy: Secondary | ICD-10-CM | POA: Insufficient documentation

## 2020-01-13 DIAGNOSIS — Z90710 Acquired absence of both cervix and uterus: Secondary | ICD-10-CM | POA: Insufficient documentation

## 2020-01-13 DIAGNOSIS — Z7982 Long term (current) use of aspirin: Secondary | ICD-10-CM | POA: Insufficient documentation

## 2020-01-13 DIAGNOSIS — Z6841 Body Mass Index (BMI) 40.0 and over, adult: Secondary | ICD-10-CM | POA: Insufficient documentation

## 2020-01-13 DIAGNOSIS — N3281 Overactive bladder: Secondary | ICD-10-CM

## 2020-01-13 DIAGNOSIS — Z853 Personal history of malignant neoplasm of breast: Secondary | ICD-10-CM | POA: Diagnosis not present

## 2020-01-13 DIAGNOSIS — N3941 Urge incontinence: Secondary | ICD-10-CM

## 2020-01-13 HISTORY — PX: BOTOX INJECTION: SHX5754

## 2020-01-13 LAB — POCT I-STAT, CHEM 8
BUN: 18 mg/dL (ref 8–23)
Calcium, Ion: 1.17 mmol/L (ref 1.15–1.40)
Chloride: 100 mmol/L (ref 98–111)
Creatinine, Ser: 1 mg/dL (ref 0.44–1.00)
Glucose, Bld: 105 mg/dL — ABNORMAL HIGH (ref 70–99)
HCT: 47 % — ABNORMAL HIGH (ref 36.0–46.0)
Hemoglobin: 16 g/dL — ABNORMAL HIGH (ref 12.0–15.0)
Potassium: 3.1 mmol/L — ABNORMAL LOW (ref 3.5–5.1)
Sodium: 141 mmol/L (ref 135–145)
TCO2: 26 mmol/L (ref 22–32)

## 2020-01-13 SURGERY — BOTOX INJECTION
Anesthesia: General | Site: Bladder

## 2020-01-13 MED ORDER — FENTANYL CITRATE (PF) 100 MCG/2ML IJ SOLN
INTRAMUSCULAR | Status: DC | PRN
  Administered 2020-01-13: 50 ug via INTRAVENOUS

## 2020-01-13 MED ORDER — PROPOFOL 10 MG/ML IV BOLUS
INTRAVENOUS | Status: DC | PRN
  Administered 2020-01-13: 50 mg via INTRAVENOUS
  Administered 2020-01-13 (×2): 10 mg via INTRAVENOUS

## 2020-01-13 MED ORDER — CHLORHEXIDINE GLUCONATE 0.12 % MT SOLN: 15.0000 mL | Freq: Once | OROMUCOSAL | Status: AC

## 2020-01-13 MED ORDER — SODIUM CHLORIDE (PF) 0.9 % IJ SOLN
INTRAMUSCULAR | Status: DC | PRN
  Administered 2020-01-13: 11 mL

## 2020-01-13 MED ORDER — ONDANSETRON HCL 4 MG/2ML IJ SOLN
INTRAMUSCULAR | Status: DC | PRN
  Administered 2020-01-13: 4 mg via INTRAVENOUS

## 2020-01-13 MED ORDER — DEXAMETHASONE SODIUM PHOSPHATE 10 MG/ML IJ SOLN
INTRAMUSCULAR | Status: DC | PRN
  Administered 2020-01-13: 10 mg via INTRAVENOUS

## 2020-01-13 MED ORDER — ONABOTULINUMTOXINA 100 UNITS IJ SOLR
INTRAMUSCULAR | Status: DC | PRN
  Administered 2020-01-13: 100 [IU] via INTRAMUSCULAR

## 2020-01-13 MED ORDER — ORAL CARE MOUTH RINSE: 15.0000 mL | Freq: Once | OROMUCOSAL | Status: AC

## 2020-01-13 MED ORDER — PROPOFOL 10 MG/ML IV BOLUS
INTRAVENOUS | Status: AC
  Filled 2020-01-13: qty 20

## 2020-01-13 MED ORDER — CHLORHEXIDINE GLUCONATE 0.12 % MT SOLN
OROMUCOSAL | Status: AC
  Administered 2020-01-13: 15 mL via OROMUCOSAL
  Filled 2020-01-13: qty 15

## 2020-01-13 MED ORDER — LACTATED RINGERS IV SOLN: INTRAVENOUS | Status: DC

## 2020-01-13 MED ORDER — CEFAZOLIN SODIUM-DEXTROSE 2-4 GM/100ML-% IV SOLN
INTRAVENOUS | Status: AC
  Filled 2020-01-13: qty 100

## 2020-01-13 MED ORDER — PROPOFOL 500 MG/50ML IV EMUL
INTRAVENOUS | Status: DC | PRN
  Administered 2020-01-13: 110 ug/kg/min via INTRAVENOUS

## 2020-01-13 MED ORDER — FENTANYL CITRATE (PF) 100 MCG/2ML IJ SOLN
INTRAMUSCULAR | Status: AC
  Filled 2020-01-13: qty 2

## 2020-01-13 MED ORDER — CEFAZOLIN SODIUM-DEXTROSE 2-4 GM/100ML-% IV SOLN
2.0000 g | INTRAVENOUS | Status: AC
  Administered 2020-01-13: 2 g via INTRAVENOUS

## 2020-01-13 SURGICAL SUPPLY — 18 items
BAG DRAIN CYSTO-URO LG1000N (MISCELLANEOUS) ×3 IMPLANT
GLOVE BIO SURGEON STRL SZ 6.5 (GLOVE) ×2 IMPLANT
GLOVE BIO SURGEONS STRL SZ 6.5 (GLOVE) ×1
GOWN STRL REUS W/ TWL LRG LVL3 (GOWN DISPOSABLE) ×2 IMPLANT
GOWN STRL REUS W/TWL LRG LVL3 (GOWN DISPOSABLE) ×6
MANIFOLD NEPTUNE II (INSTRUMENTS) ×3 IMPLANT
NDL INJETAK FLEX 70CM BOTOX (NEEDLE) ×1 IMPLANT
NDL SAFETY ECLIPSE 18X1.5 (NEEDLE) ×2 IMPLANT
NEEDLE HYPO 18GX1.5 SHARP (NEEDLE) ×6
NEEDLE INJETAK FLEX 70CM BOTOX (NEEDLE) ×3 IMPLANT
PACK CYSTO AR (MISCELLANEOUS) ×3 IMPLANT
SET CYSTO W/LG BORE CLAMP LF (SET/KITS/TRAYS/PACK) ×3 IMPLANT
SOL .9 NS 3000ML IRR  AL (IV SOLUTION) ×3
SOL .9 NS 3000ML IRR AL (IV SOLUTION) ×1
SOL .9 NS 3000ML IRR UROMATIC (IV SOLUTION) ×1 IMPLANT
SURGILUBE 2OZ TUBE FLIPTOP (MISCELLANEOUS) ×3 IMPLANT
SYR 10ML LL (SYRINGE) ×6 IMPLANT
WATER STERILE IRR 1000ML POUR (IV SOLUTION) ×3 IMPLANT

## 2020-01-13 NOTE — Transfer of Care (Signed)
Immediate Anesthesia Transfer of Care Note  Patient: [name]  Procedure(s) Performed: BOTOX INJECTION (N/A Bladder)  Patient Location: PACU  Anesthesia Type:General  Level of Consciousness: awake, drowsy and patient cooperative  Airway & Oxygen Therapy: Patient Spontanous Breathing and Patient connected to face mask oxygen  Post-op Assessment: Report given to RN and Post -op Vital signs reviewed and stable  Post vital signs: Reviewed and stable  Last Vitals:  Vitals Value Taken Time  BP 111/60 01/13/20 0845  Temp 36.3 C 01/13/20 0839  Pulse 94 01/13/20 0851  Resp 18 01/13/20 0851  SpO2 92 % 01/13/20 0851  Vitals shown include unvalidated device data.  Last Pain:  Vitals:   01/13/20 0839  TempSrc:   PainSc: 0-No pain         Complications: No complications documented.

## 2020-01-13 NOTE — Discharge Instructions (Signed)
Botulinum Toxin Bladder Injection  A botulinum toxin bladder injection is a procedure to treat an overactive bladder. During the procedure, a drug called botulinum toxin is injected into the bladder through a long, thin needle. This drug relaxes the bladder muscles and reduces overactivity. You may need this procedure if your medicines are not working or you cannot take them. The procedure may be repeated as needed. The treatment usually lasts for 6 months. Your health care provider will monitor you to see how well you respond. Tell a health care provider about:  Any allergies you have.  All medicines you are taking, including vitamins, herbs, eye drops, creams, and over-the-counter medicines.  Any problems you or family members have had with anesthetic medicines.  Any blood disorders you have.  Any surgeries you have had.  Any medical conditions you have.  Any previous reactions to a botulinum toxin injection.  Any symptoms of urinary tract infection. These include chills, fever, a burning feeling when passing urine, and needing to pass urine often.  Whether you are pregnant or may be pregnant. What are the risks? Generally this is a safe procedure. However, problems may occur, including:  Not being able to pass urine. If this happens, you may need to have your bladder emptied with a thin tube inserted into your urethra (urinary catheter).  Bleeding.  Urinary tract infection.  Allergic reaction to the botulinum toxin.  Pain or burning when passing urine.  Damage to other structures or organs. What happens before the procedure? Staying hydrated Follow instructions from your health care provider about hydration, which may include:  Up to 2 hours before the procedure - you may continue to drink clear liquids, such as water, clear fruit juice, black coffee, and plain tea. Eating and drinking restrictions Follow instructions from your health care provider about eating and  drinking, which may include:  8 hours before the procedure - stop eating heavy meals or foods, such as meat, fried foods, or fatty foods.  6 hours before the procedure - stop eating light meals or foods, such as toast or cereal.  6 hours before the procedure - stop drinking milk or drinks that contain milk.  2 hours before the procedure - stop drinking clear liquids. Medicines Ask your health care provider about:  Changing or stopping your regular medicines. This is especially important if you are taking diabetes medicines or blood thinners.  Taking medicines such as aspirin and ibuprofen. These medicines can thin your blood. Do not take these medicines unless your health care provider tells you to take them.  Taking over-the-counter medicines, vitamins, herbs, and supplements. General instructions  Plan to have someone take you home from the hospital or clinic.  If you will be going home right after the procedure, plan to have someone with you for 24 hours.  Ask your health care provider what steps will be taken to help prevent infection. These may include: ? Removing hair at the procedure site. ? Washing skin with a germ-killing soap. ? Antibiotic medicine. What happens during the procedure?   You will be asked to empty your bladder.  An IV will be inserted into one of your veins.  You will be given one or more of the following: ? A medicine to help you relax (sedative). ? A medicine to numb the area (local anesthetic). ? A medicine to make you fall asleep (general anesthetic).  A long, thin scope called a cystoscope will be passed into your bladder through the part  of the body that carries urine from your bladder (urethra).  The cystoscope will be used to fill your bladder with water.  A long needle will be passed through the cystoscope and into the bladder.  The botulinum toxin will be injected into your bladder. It may be injected into multiple areas of your  bladder.  Your bladder will be emptied, and the cystoscope will be removed. The procedure may vary among health care providers and hospitals. What can I expect after procedure? After your procedure, it is common to have:  Blood-tinged urine.  Burning or soreness when you pass urine. Follow these instructions at home: Medicines  Take over-the-counter and prescription medicines only as told by your health care provider.  If you were prescribed an antibiotic medicine, take it as told by your health care provider. Do not stop taking the antibiotic even if you start to feel better. General instructions   Do not drive for 24 hours if you were given a sedative during your procedure.  Drink enough fluid to keep your urine pale yellow.  Return to your normal activities as told by your health care provider. Ask your health care provider what activities are safe for you.  Keep all follow-up visits as told by your health care provider. This is important. Contact a health care provider if you have:  A fever or chills.  Blood-tinged urine for more than one day after your procedure.  Worsening pain or burning when you pass urine.  Pain or burning when passing urine for more than two days after your procedure.  Trouble emptying your bladder. Get help right away if you:  Have bright red blood in your urine.  Are unable to pass urine. Summary  A botulinum toxin bladder injection is a procedure to treat an overactive bladder.  This is generally a very safe procedure. However, problems may occur, including not being able to pass urine, bleeding, infection, pain, and allergic reactions to medicines.  You will be told when to stop eating and drinking, and what medicines to change or stop. Follow instructions carefully.  After the procedure, it is common to have blood in urine and to have soreness or burning when passing urine.  Contact a health care provider if you have a fever, have  blood in urine for more than a few days, or have trouble passing urine. Get help right away if you have bright red blood in the urine, or if you are unable to pass urine. This information is not intended to replace advice given to you by your health care provider. Make sure you discuss any questions you have with your health care provider. Document Revised: 08/25/2017 Document Reviewed: 08/25/2017 Elsevier Patient Education  2020 [place]   1) The drugs that you were given will stay in your system until tomorrow so for the next 24 hours you should not:  A) Drive an automobile B) Make any legal decisions C) Drink any alcoholic beverage   2) You may resume regular meals tomorrow.  Today it is better to start with liquids and gradually work up to solid foods.  You may eat anything you prefer, but it is better to start with liquids, then soup and crackers, and gradually work up to solid foods.   3) Please notify your doctor immediately if you have any unusual bleeding, trouble breathing, redness and pain at the surgery site, drainage, fever, or pain not relieved by medication.  4) Additional Instructions:        Please contact your physician with any problems or Same Day Surgery at [phone], Monday through Friday 6 am to 4 pm, or [place] at [facility] number at [phone].

## 2020-01-13 NOTE — Anesthesia Preprocedure Evaluation (Signed)
Anesthesia Evaluation  Patient identified by MRN, date of birth, ID band Patient awake    Reviewed: Allergy & Precautions, H&P , NPO status , Patient's Chart, lab work & pertinent test results, reviewed documented beta blocker date and time   History of Anesthesia Complications Negative for: history of anesthetic complications  Airway Mallampati: II  TM Distance: >3 FB Neck ROM: full    Dental  (+) Dental Advidsory Given, Caps, Teeth Intact   Pulmonary neg pulmonary ROS,    Pulmonary exam normal breath sounds clear to auscultation       Cardiovascular Exercise Tolerance: Good hypertension, (-) angina(-) Past MI and (-) Cardiac Stents (-) dysrhythmias (-) Valvular Problems/Murmurs Rhythm:regular Rate:Normal     Neuro/Psych PSYCHIATRIC DISORDERS Anxiety negative neurological ROS     GI/Hepatic Neg liver ROS, GERD  ,  Endo/Other  neg diabetesMorbid obesity  Renal/GU negative Renal ROS  negative genitourinary   Musculoskeletal   Abdominal   Peds  Hematology negative hematology ROS (+)   Anesthesia Other Findings Past Medical History: No date: Anemia     Comment:  B12, d2 and magnesium deficiencies No date: Anxiety No date: Arthritis     Comment:  OSTEOARTHRITIS 06/10/2014: Breast cancer of upper-outer quadrant of right female  breast ([place])     Comment:  10 mm invasive mammary carcinoma, T1b,Nx; ER 90%, PR               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 extensive intermediate grade               DCIS.  No date: Constipation 06/03/2014: DCIS (ductal carcinoma in situ) of breast No date: DDD (degenerative disc disease), lumbar No date: Dyspnea No date: GERD (gastroesophageal reflux disease) No date: Heart murmur No date: History of blood transfusion No date: Hyperlipidemia No date: Hypertension 2016: Personal history of chemotherapy     Comment:  right breast ca 2016: Personal history of radiation therapy     Comment:   mammosite 12/03/2014: SOB (shortness of breath)   Reproductive/Obstetrics negative OB ROS                             Anesthesia Physical  Anesthesia Plan  ASA: III  Anesthesia Plan: General   Post-op Pain Management:    Induction: Intravenous  PONV Risk Score and Plan: 3 and Propofol infusion and TIVA  Airway Management Planned: Natural Airway and Simple Face Mask  Additional Equipment:   Intra-op Plan:   Post-operative Plan:   Informed Consent: I have reviewed the patients History and Physical, chart, labs and discussed the procedure including the risks, benefits and alternatives for the proposed anesthesia with the patient or authorized representative who has indicated his/her understanding and acceptance.     Dental Advisory Given  Plan Discussed with: CRNA  Anesthesia Plan Comments:         Anesthesia Quick Evaluation

## 2020-01-13 NOTE — Anesthesia Postprocedure Evaluation (Signed)
Anesthesia Post Note  Patient: Maria Gutierrez  Procedure(s) Performed: BOTOX INJECTION (N/A Bladder)  Patient location during evaluation: PACU Anesthesia Type: General Level of consciousness: awake and alert Pain management: pain level controlled Vital Signs Assessment: post-procedure vital signs reviewed and stable Respiratory status: spontaneous breathing, nonlabored ventilation, respiratory function stable and patient connected to nasal cannula oxygen Cardiovascular status: blood pressure returned to baseline and stable Postop Assessment: no apparent nausea or vomiting Anesthetic complications: no   No complications documented.   Last Vitals:  Vitals:   01/13/20 0900 01/13/20 0909  BP:  (!) 138/45  Pulse: 87 90  Resp: 18 16  Temp: (!) 36.1 C (!) 36.3 C  SpO2: 94% 100%    Last Pain:  Vitals:   01/13/20 0909  TempSrc: Temporal  PainSc: 0-No pain                 Martha Clan

## 2020-01-13 NOTE — Anesthesia Procedure Notes (Signed)
Procedure Name: General with mask airway Performed by: Fletcher-Harrison, Isebella Upshur, CRNA Pre-anesthesia Checklist: Patient identified, Emergency Drugs available, Suction available and Patient being monitored Patient Re-evaluated:Patient Re-evaluated prior to induction Oxygen Delivery Method: Simple face mask Induction Type: IV induction Placement Confirmation: positive ETCO2 and CO2 detector Dental Injury: Teeth and Oropharynx as per pre-operative assessment        

## 2020-01-13 NOTE — H&P (Signed)
H&P updated 01/13/20 No changes in below  Maria Gutierrez [birth year] [id number]  Referring provider: [name], MD [address] [facility] [place],  [place] [address]     Chief Complaint  Patient presents with  . Over Active Bladder    60mo follow up    HPI: Maria Gutierrez is a 79 y.o. female who returns for a 1 month follow up of stress incontinence and OAB/urge incontinence.   Since last visit, she tried Toviaz 4 mg for a month.  She did see about a 25% improvement in her daytime urinary symptoms including decreased episodes of incontinence.  She is still fairly symptomatic.  She previously tried and failed Myrbetriq 50 mg.  Her nocturia has improved and she is sleeping through the night. Her day time symptoms are steadily improving.   She did some reading on treatments for refractory symptoms including Botox and is very interested in this.   PMH:     Past Medical History:  Diagnosis Date  . Anxiety   . Arthritis   . Breast cancer of upper-outer quadrant of right female breast ([place]) 06/10/2014   10 mm invasive mammary carcinoma, T1b,Nx; ER 90%, 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 extensive intermediate grade DCIS.   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 GERD (gastroesophageal reflux disease)   . Heart murmur   . Hyperlipidemia   . Hypertension   . Personal history of chemotherapy 2016   right breast ca  . Personal history of radiation therapy 2016   mammosite  . SOB (shortness of breath) 12/03/2014    Surgical History:      Past Surgical History:  Procedure Laterality Date  . ABDOMINAL HYSTERECTOMY    . AXILLARY LYMPH NODE BIOPSY Right 07/14/2014   Procedure: AXILLARY LYMPH NODE BIOPSY;  Surgeon: Robert Bellow, MD;  Location: [facility] ORS;  Service: General;  Laterality: Right;  . BREAST BIOPSY Left 2012   benign  . BREAST BIOPSY Right 05/26/2014   DCIS   . BREAST EXCISIONAL BIOPSY Right 06/28/2017     ULCERATED SKIN WITH UNDERLYING FAT NECROSIS, ACUTE INFLAMMATION AND REACTIVE EPITHELIAL ATYPIA  . BREAST LUMPECTOMY Right 06/10/2014   DCIS and Invasive ductal carcinoma, clear margins  . BREAST SURGERY Right 06/10/14   Wide excision for intermediate grade DCIS, identification of a 10  millimeter area of invasive mammary carcinoma.  . CHOLECYSTECTOMY  2012  . COLONOSCOPY  2009  . EYE SURGERY Bilateral 2014  . JOINT REPLACEMENT Left 2001  . JOINT REPLACEMENT Right 2003  . JOINT REPLACEMENT Left 2004   replacement joint broken  . kneee surgery Left   . PORT-A-CATH REMOVAL  02/11/2016   Dr Bary Castilla  . PORTACATH PLACEMENT Left 08/27/2014   Procedure: INSERTION PORT-A-CATH;  Surgeon: Robert Bellow, MD;  Location: [facility] ORS;  Service: General;  Laterality: Left;    Home Medications:       Allergies as of 11/12/2019      Reactions   Tape Other (See Comments)   Blisters on skin from tape during recent surgery.         Medication List       Accurate as of November 12, 2019  2:17 PM. If you have any questions, ask your nurse or doctor.        aspirin EC 81 MG tablet Take 81 mg by mouth daily.   calcium carbonate 600 MG Tabs tablet Commonly known as: OS-CAL Take 600 mg by  mouth daily.   cyanocobalamin 1000 MCG tablet Take by mouth.   cyclobenzaprine 10 MG tablet Commonly known as: FLEXERIL 1/2-1 po qHS prn   fluticasone 50 MCG/ACT nasal spray Commonly known as: Flonase Place 1 spray into both nostrils daily.   GLUCOSAMINE 1500 COMPLEX PO Take 1 tablet by mouth daily.   hydrochlorothiazide 25 MG tablet Commonly known as: HYDRODIURIL Take 25 mg by mouth daily.   HYDROcodone-acetaminophen 7.5-325 MG tablet Commonly known as: NORCO Take 1 tablet by mouth 2 (two) times daily as needed for pain.   lansoprazole 15 MG capsule Commonly known as: PREVACID Take 15 mg by mouth every morning.   Magnesium Oxide 500 MG Tabs Take by mouth.     meloxicam 15 MG tablet Commonly known as: MOBIC Take 1 tablet (15 mg total) by mouth daily as needed for pain.   metoprolol succinate 50 MG 24 hr tablet Commonly known as: TOPROL-XL Take 50 mg by mouth every morning. Take with or immediately following a meal.   potassium chloride 10 MEQ tablet Commonly known as: KLOR-CON Take by mouth.   simvastatin 40 MG tablet Commonly known as: ZOCOR Take 1 tablet by mouth daily.   tamoxifen 20 MG tablet Commonly known as: NOLVADEX TAKE 1 TABLET BY MOUTH  DAILY   venlafaxine XR 75 MG 24 hr capsule Commonly known as: EFFEXOR-XR TAKE 1 CAPSULE BY MOUTH  DAILY WITH BREAKFAST   Vitamin D (Ergocalciferol) 1.25 MG (50000 UNIT) Caps capsule Commonly known as: DRISDOL TAKE 1 CAPSULE BY MOUTH  ONCE WEEKLY       Allergies:       Allergies  Allergen Reactions  . Tape Other (See Comments)    Blisters on skin from tape during recent surgery.    Family History:      Family History  Problem Relation Age of Onset  . Breast cancer Neg Hx     Social History:  reports that she has never smoked. She has never used smokeless tobacco. She reports that she does not drink alcohol and does not use drugs.   Physical Exam: BP 118/65   Pulse (!) 108   Ht 5\' 3"  (1.6 m)   Wt 281 lb (127.5 kg)   BMI 49.78 kg/m   Constitutional:  Alert and oriented, No acute distress. HEENT: [place] AT, moist mucus membranes.  Trachea midline, no masses. Cardiovascular: No clubbing, cyanosis, or edema. Respiratory: Normal respiratory effort, no increased work of breathing. Skin: No rashes, bruises or suspicious lesions. Neurologic: Grossly intact, no focal deficits, moving all 4 extremities. Psychiatric: Normal mood and affect.  Laboratory Data:  Recent Labs       Lab Results  Component Value Date   CREATININE 1.34 (H) 08/05/2019      Assessment & Plan:    1. Stress incontinence Discussed behavorial modifications.  Significantly  more bothered by #2  2. OAB/urge incontinence  Patient tried Lisbeth Ply and symptoms have improved mildly I will increase dose of Toviaz x 1 month, if symptoms do not significantly improve I will proceed with botox.  We discussed risk of Botox at length including risk of retention, bleeding, infection amongst others.  She has previously provided literature on this.  If her symptoms are not satisfactory on Toviaz, she like to pursue Botox.  She will let us know the end of this trial whether or not she wants to pursue this and we will arrange it without having her come back to the office to void another visit. - Trial  Toviaz Rx sent.    [place] [address] [place], [place] [address] [phone]

## 2020-01-13 NOTE — Op Note (Signed)
Date of procedure: 01/13/20  Preoperative diagnosis:  1. Refractory OAB  Postoperative diagnosis:  1. Same as above  Procedure: 1. Cystoscopy 2. Intravesical injection of botulinum toxin  Surgeon: Hollice Espy, MD  Anesthesia: General  Complications: None  Intraoperative findings: Uncomplicated procedure, 110 U  EBL: Minimal  Specimens: None  Drains: None  Indication:  Maria Gutierrez is a 79 year old female. patient with refractory OAB who has failed oral meds.  After reviewing the management options for treatment, the patient elected to proceed with the above surgical procedure(s). We have discussed the potential benefits and risks of the procedure, side effects of the proposed treatment, the likelihood of the patient achieving the goals of the procedure, and any potential problems that might occur during the procedure or recuperation. Informed consent has been obtained.  Description of procedure:  The patient was taken to the operating room and general anesthesia was induced.  The patient was placed in the dorsal lithotomy position, prepped and draped in the usual sterile fashion, and preoperative antibiotics were administered. A preoperative time-out was performed.   A 21 French rigid cystoscope was advanced per urethra into the bladder.  The bladder was carefully inspected and showed mild resolving diffuse cystitis for which she is being treated. At this point time, a total of 100 units of botulinum toxin was injected into the muscularis propria of the bladder in a total of 20 0.5 cc injections into Rose across the posterior bladder wall.  The procedure was uncomplicated.  Hemostasis was excellent.  The bladder was drained and the scope was removed.  The patient was then clean and dry, repositioned the supine position, reversed by anesthesia, and taken to the PACU in stable condition  Plan: Patient will follow up in a week with a PA  for PVR then in 6 weeks with  me to assess effecacy.   Hollice Espy, M.D.

## 2020-01-21 ENCOUNTER — Encounter: Payer: Self-pay | Admitting: Physician Assistant

## 2020-01-21 ENCOUNTER — Other Ambulatory Visit: Payer: Self-pay

## 2020-01-21 ENCOUNTER — Ambulatory Visit (INDEPENDENT_AMBULATORY_CARE_PROVIDER_SITE_OTHER): Payer: Medicare Other | Admitting: Physician Assistant

## 2020-01-21 VITALS — BP 157/87 | HR 93 | Ht 62.0 in | Wt 282.0 lb

## 2020-01-21 DIAGNOSIS — N3281 Overactive bladder: Secondary | ICD-10-CM

## 2020-01-21 DIAGNOSIS — N3941 Urge incontinence: Secondary | ICD-10-CM

## 2020-01-21 DIAGNOSIS — R350 Frequency of micturition: Secondary | ICD-10-CM

## 2020-01-21 LAB — BLADDER SCAN AMB NON-IMAGING

## 2020-01-21 MED ORDER — SULFAMETHOXAZOLE-TRIMETHOPRIM 800-160 MG PO TABS: 1.0000 | ORAL_TABLET | Freq: Two times a day (BID) | ORAL | 0 refills | Status: AC

## 2020-01-21 NOTE — Progress Notes (Signed)
01/21/2020 1:18 PM   Maria Gutierrez [birth year] [id number]  CC: Chief Complaint  Patient presents with  . Follow-up    HPI: Maria Gutierrez is a 79 y.o. female with refractory OAB with mixed stress and urge incontinence who presents today for PVR after intravesical Botox injections with Dr. Erlene Quan on 01/13/2020.  Today she reports she noted slight improvement in her urinary leakage over the past week.  However, she reports increased urinary frequency over the past 1 to 2 days associated with new back pain.  She denies dysuria.  In-office UA today positive for trace intact blood and 1+ leukocyte esterase; urine microscopy with 11-30 WBCs/HPF and moderate bacteria. PVR 77mL.  PMH: Past Medical History:  Diagnosis Date  . Anemia    B12, d2 and magnesium deficiencies  . Anxiety   . Arthritis    OSTEOARTHRITIS  . Breast cancer of upper-outer quadrant of right female breast ([place]) 06/10/2014   10 mm invasive mammary carcinoma, T1b,Nx; ER 90%, 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 extensive intermediate grade DCIS.   [name] Constipation   . 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 DDD (degenerative disc disease), lumbar   . Dyspnea   . GERD (gastroesophageal reflux disease)   . Heart murmur   . History of blood transfusion   . Hyperlipidemia   . Hypertension   . Personal history of chemotherapy 2016   right breast ca  . Personal history of radiation therapy 2016   mammosite  . SOB (shortness of breath) 12/03/2014    Surgical History: Past Surgical History:  Procedure Laterality Date  . ABDOMINAL HYSTERECTOMY    . AXILLARY LYMPH NODE BIOPSY Right 07/14/2014   Procedure: AXILLARY LYMPH NODE BIOPSY;  Surgeon: Robert Bellow, MD;  Location: [facility] ORS;  Service: General;  Laterality: Right;  . BOTOX INJECTION N/A 01/13/2020   Procedure: BOTOX INJECTION;  Surgeon: Hollice Espy, MD;  Location: [facility] ORS;  Service: Urology;  Laterality: N/A;  . BREAST BIOPSY Left 2012   benign    . BREAST BIOPSY Right 05/26/2014   DCIS   . BREAST EXCISIONAL BIOPSY Right 06/28/2017    ULCERATED SKIN WITH UNDERLYING FAT NECROSIS, ACUTE INFLAMMATION AND REACTIVE EPITHELIAL ATYPIA  . BREAST LUMPECTOMY Right 06/10/2014   DCIS and Invasive ductal carcinoma, clear margins  . BREAST SURGERY Right 06/10/14   Wide excision for intermediate grade DCIS, identification of a 10  millimeter area of invasive mammary carcinoma.  . CHOLECYSTECTOMY  2012  . COLONOSCOPY  2009  . EYE SURGERY Bilateral 2014   cataract  . JOINT REPLACEMENT Left 2001   knee  . JOINT REPLACEMENT Right 2003   knee  . JOINT REPLACEMENT Left 2004   replacement joint broken  . kneee surgery Left   . PORT-A-CATH REMOVAL  02/11/2016   Dr Bary Castilla  . PORTACATH PLACEMENT Left 08/27/2014   Procedure: INSERTION PORT-A-CATH;  Surgeon: Robert Bellow, MD;  Location: [facility] ORS;  Service: General;  Laterality: Left;    Home Medications:  Allergies as of 01/21/2020      Reactions   Tape Other (See Comments)   Blisters on skin from tape during recent surgery. PAPER TAPE IS OKAY      Medication List       Accurate as of January 21, 2020  1:18 PM. If you have any questions, ask your nurse or doctor.        aspirin EC 81 MG tablet Take 81 mg by mouth daily.  calcium carbonate 600 MG Tabs tablet Commonly known as: OS-CAL Take 600 mg by mouth daily.   cyanocobalamin 1000 MCG tablet Take 1,000 mcg by mouth daily.   cyclobenzaprine 10 MG tablet Commonly known as: FLEXERIL 1/2-1 po qHS prn   fexofenadine 180 MG tablet Commonly known as: ALLEGRA Take 180 mg by mouth daily.   fluticasone 50 MCG/ACT nasal spray Commonly known as: Flonase Place 1 spray into both nostrils daily.   GLUCOSAMINE 1500 COMPLEX PO Take 1 tablet by mouth daily.   hydrochlorothiazide 25 MG tablet Commonly known as: HYDRODIURIL Take 25 mg by mouth daily.   HYDROcodone-acetaminophen 7.5-325 MG tablet Commonly known as: NORCO Take 1  tablet by mouth 2 (two) times daily as needed for pain.   lansoprazole 15 MG capsule Commonly known as: PREVACID Take 15 mg by mouth every morning.   Magnesium Oxide 500 MG Tabs Take by mouth.   meloxicam 15 MG tablet Commonly known as: MOBIC Take 1 tablet (15 mg total) by mouth daily as needed for pain.   metoprolol succinate 50 MG 24 hr tablet Commonly known as: TOPROL-XL Take 50 mg by mouth every morning. Take with or immediately following a meal.   multivitamin with minerals Tabs tablet Take 1 tablet by mouth daily.   potassium chloride 10 MEQ tablet Commonly known as: KLOR-CON Take by mouth.   simvastatin 40 MG tablet Commonly known as: ZOCOR Take 40 mg by mouth at bedtime.   tamoxifen 20 MG tablet Commonly known as: NOLVADEX TAKE 1 TABLET BY MOUTH  DAILY What changed: additional instructions   venlafaxine XR 75 MG 24 hr capsule Commonly known as: EFFEXOR-XR TAKE 1 CAPSULE BY MOUTH  DAILY WITH BREAKFAST What changed: See the new instructions.   Vitamin D (Ergocalciferol) 1.25 MG (50000 UNIT) Caps capsule Commonly known as: DRISDOL TAKE 1 CAPSULE BY MOUTH  ONCE WEEKLY What changed:   when to take this  additional instructions       Allergies:  Allergies  Allergen Reactions  . Tape Other (See Comments)    Blisters on skin from tape during recent surgery. PAPER TAPE IS OKAY    Family History: Family History  Problem Relation Age of Onset  . Breast cancer Neg Hx     Social History:   reports that she has never smoked. She has never used smokeless tobacco. She reports that she does not drink alcohol and does not use drugs.  Physical Exam: BP (!) 157/87 (BP Location: Left Arm, Patient Position: Sitting, Cuff Size: Large)   Pulse 93   Ht 5\' 2"  (1.575 m)   Wt 282 lb (127.9 kg)   BMI 51.58 kg/m   Constitutional:  Alert and oriented, no acute distress, nontoxic appearing HEENT: [place], AT Cardiovascular: No clubbing, cyanosis, or edema Respiratory:  Normal respiratory effort, no increased work of breathing Skin: No rashes, bruises or suspicious lesions Neurologic: Grossly intact, no focal deficits, moving all 4 extremities Psychiatric: Normal mood and affect  Laboratory Data: Results for orders placed or performed in visit on 01/21/20  CULTURE, URINE COMPREHENSIVE   Specimen: Urine   UR  Result Value Ref Range   Urine Culture, Comprehensive Final report (A)    Organism ID, Bacteria Proteus mirabilis (A)    Organism ID, Bacteria Escherichia coli (A)    ANTIMICROBIAL SUSCEPTIBILITY Comment   Bladder Scan (Post Void Residual) in office  Result Value Ref Range   Scan Result 73mL    Assessment & Plan:   1. Overactive bladder  PVR WNL following intravesical Botox injections.  Counseled patient that Botox takes 14 days to take full effect and that she is likely to experience additional symptomatic relief in the coming week.  Counseled her to follow-up in our clinic immediately or proceed to the emergency room if outside office hours if she develops difficulty urinating.  She expressed understanding. - Bladder Scan (Post Void Residual) in office  2. Urinary frequency Possibly associated with #1 above, however given reports of low back pain and both pyuria and bacteriuria on UA, will start empiric Bactrim and send for culture for further evaluation. - Urinalysis, Complete - CULTURE, URINE COMPREHENSIVE - sulfamethoxazole-trimethoprim (BACTRIM DS) 800-160 MG tablet; Take 1 tablet by mouth 2 (two) times daily for 5 days.  Dispense: 10 tablet; Refill: 0   Return if symptoms worsen or fail to improve.  Debroah Loop, PA-C  [facility] [address], [place], [place] [address] [phone]

## 2020-01-24 LAB — CULTURE, URINE COMPREHENSIVE

## 2020-01-28 LAB — URINALYSIS, COMPLETE
Bilirubin, UA: NEGATIVE
Glucose, UA: NEGATIVE
Ketones, UA: NEGATIVE
Nitrite, UA: NEGATIVE
Protein,UA: NEGATIVE
Specific Gravity, UA: 1.02 (ref 1.005–1.030)
Urobilinogen, Ur: 0.2 mg/dL (ref 0.2–1.0)
pH, UA: 7 (ref 5.0–7.5)

## 2020-01-28 LAB — MICROSCOPIC EXAMINATION

## 2020-02-05 ENCOUNTER — Other Ambulatory Visit: Payer: Self-pay

## 2020-02-05 ENCOUNTER — Ambulatory Visit
Admission: RE | Admit: 2020-02-05 | Discharge: 2020-02-05 | Disposition: A | Discharge status: Home / Self Care | Payer: Medicare Other | Source: Ambulatory Visit | Attending: Internal Medicine | Admitting: Internal Medicine

## 2020-02-05 DIAGNOSIS — Z780 Asymptomatic menopausal state: Secondary | ICD-10-CM | POA: Insufficient documentation

## 2020-02-05 DIAGNOSIS — Z853 Personal history of malignant neoplasm of breast: Secondary | ICD-10-CM | POA: Insufficient documentation

## 2020-02-05 DIAGNOSIS — Z13820 Encounter for screening for osteoporosis: Secondary | ICD-10-CM | POA: Diagnosis not present

## 2020-02-05 DIAGNOSIS — Z923 Personal history of irradiation: Secondary | ICD-10-CM | POA: Insufficient documentation

## 2020-02-05 DIAGNOSIS — Z9221 Personal history of antineoplastic chemotherapy: Secondary | ICD-10-CM | POA: Diagnosis not present

## 2020-02-05 DIAGNOSIS — Z79811 Long term (current) use of aromatase inhibitors: Secondary | ICD-10-CM | POA: Insufficient documentation

## 2020-02-05 DIAGNOSIS — M85852 Other specified disorders of bone density and structure, left thigh: Secondary | ICD-10-CM | POA: Insufficient documentation

## 2020-02-07 ENCOUNTER — Other Ambulatory Visit: Payer: Self-pay

## 2020-02-07 ENCOUNTER — Inpatient Hospital Stay: Payer: Medicare Other | Attending: Internal Medicine

## 2020-02-07 ENCOUNTER — Other Ambulatory Visit: Payer: Self-pay | Admitting: *Deleted

## 2020-02-07 ENCOUNTER — Encounter: Payer: Self-pay | Admitting: Internal Medicine

## 2020-02-07 ENCOUNTER — Encounter (INDEPENDENT_AMBULATORY_CARE_PROVIDER_SITE_OTHER): Payer: Self-pay

## 2020-02-07 ENCOUNTER — Inpatient Hospital Stay (HOSPITAL_BASED_OUTPATIENT_CLINIC_OR_DEPARTMENT_OTHER): Payer: Medicare Other | Admitting: Internal Medicine

## 2020-02-07 DIAGNOSIS — Z170 Estrogen receptor positive status [ER+]: Secondary | ICD-10-CM

## 2020-02-07 DIAGNOSIS — C50811 Malignant neoplasm of overlapping sites of right female breast: Secondary | ICD-10-CM | POA: Diagnosis present

## 2020-02-07 DIAGNOSIS — M8580 Other specified disorders of bone density and structure, unspecified site: Secondary | ICD-10-CM | POA: Diagnosis not present

## 2020-02-07 DIAGNOSIS — Z79810 Long term (current) use of selective estrogen receptor modulators (SERMs): Secondary | ICD-10-CM | POA: Diagnosis not present

## 2020-02-07 LAB — COMPREHENSIVE METABOLIC PANEL
ALT: 17 U/L (ref 0–44)
AST: 24 U/L (ref 15–41)
Albumin: 3.8 g/dL (ref 3.5–5.0)
Alkaline Phosphatase: 46 U/L (ref 38–126)
Anion gap: 13 (ref 5–15)
BUN: 16 mg/dL (ref 8–23)
CO2: 27 mmol/L (ref 22–32)
Calcium: 8.8 mg/dL — ABNORMAL LOW (ref 8.9–10.3)
Chloride: 99 mmol/L (ref 98–111)
Creatinine, Ser: 1 mg/dL (ref 0.44–1.00)
GFR, Estimated: 58 mL/min — ABNORMAL LOW (ref >60)
Glucose, Bld: 120 mg/dL — ABNORMAL HIGH (ref 70–99)
Potassium: 3 mmol/L — ABNORMAL LOW (ref 3.5–5.1)
Sodium: 139 mmol/L (ref 135–145)
Total Bilirubin: 1.2 mg/dL (ref 0.3–1.2)
Total Protein: 6.7 g/dL (ref 6.5–8.1)

## 2020-02-07 LAB — CBC WITH DIFFERENTIAL/PLATELET
Abs Immature Granulocytes: 0.03 10*3/uL (ref 0.00–0.07)
Basophils Absolute: 0 10*3/uL (ref 0.0–0.1)
Basophils Relative: 0 %
Eosinophils Absolute: 0.1 10*3/uL (ref 0.0–0.5)
Eosinophils Relative: 1 %
HCT: 41.9 % (ref 36.0–46.0)
Hemoglobin: 14.3 g/dL (ref 12.0–15.0)
Immature Granulocytes: 0 %
Lymphocytes Relative: 30 %
Lymphs Abs: 3.1 10*3/uL (ref 0.7–4.0)
MCH: 32 pg (ref 26.0–34.0)
MCHC: 34.1 g/dL (ref 30.0–36.0)
MCV: 93.7 fL (ref 80.0–100.0)
Monocytes Absolute: 0.9 10*3/uL (ref 0.1–1.0)
Monocytes Relative: 9 %
Neutro Abs: 6.2 10*3/uL (ref 1.7–7.7)
Neutrophils Relative %: 60 %
Platelets: 191 10*3/uL (ref 150–400)
RBC: 4.47 MIL/uL (ref 3.87–5.11)
RDW: 13.1 % (ref 11.5–15.5)
WBC: 10.4 10*3/uL (ref 4.0–10.5)
nRBC: 0 % (ref 0.0–0.2)

## 2020-02-07 NOTE — Assessment & Plan Note (Addendum)
#   Breast cancer ER/PR/her 2 Neu-POSITIVE; stage I- on tamoxifen [s/p TAH].  STABLE. July 2021-mammo-WNL. STABLE; no evidnce of recurrence; discussed re: extended- will continue for now.   # Hot flashes grade 1-STABLE Continue Effexor.   # fatigue- ? OSA [could not tolerate sleep study]- defer tp PCP.   # DEC 2021-- BMD- osteopenia- -1.3 [stable from 2018];Continue calcium daily. STABLE.  Reviewed results of the bone density.  # Hypokalmeia- 3.0; ? HCTZ; defer to PCP.;  Patient on K-Dur.  # DISPOSITION:   # follow up in 6 months- MD/labs-cbc/cmpDr.B

## 2020-02-07 NOTE — Progress Notes (Signed)
[place] OFFICE PROGRESS NOTE  Patient Care Team: [name], MD as PCP - General (Family Medicine) [name], MD as Referring Physician (Family Medicine) Bary Castilla Forest Gleason, MD (General Surgery)  Cancer Staging No matching staging information was found for the patient.   Oncology History Overview Note  1.  Carcinoma of right breast.  Status post lumpectomy and sentinel lymph node evaluation.  Tumor size 1 cm.  Estrogen and progesterone receptor positive.  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  Diagnosis in April of 2016 2.  Accelerated partial breast radiation in June of 2016 3.  Weekly Taxol and Herceptin therapy for adjuvant treatment starting from July of 2016; Finished herceptin July 2017   # 5.Patient has been taken off letrozole because of bony pain and started on tamoxifen 20 mg by mouth daily from May 12, 2015; Tamoxifen [Hx of TAH]  # BMD sep 2018- Osteopenia- 1.3;    DIAGNOSIS: BREAST CA ER/PR/her 2 NEU POSITIVE  STAGE:  I ;GOALS: cure  CURRENT/MOST RECENT THERAPY- [  Tamoxifen]    Carcinoma of overlapping sites of right breast in female, estrogen receptor positive ([place])      INTERVAL HISTORY:  AMBROSIA WISNEWSKI 79 y.o.  female pleasant patient above history of stage I breast cancer ER PR positive HER-2/neu positive on tamoxifen is here for follow-up/review results of the bone density.  Patient denies any new lumps or bumps.  Denies any headaches but denies any nausea vomiting.  No worsening joint pains.  Has chronic fatigue.  However not any worse.  Review of Systems  Constitutional: Positive for malaise/fatigue. Negative for chills, diaphoresis, fever and weight loss.  HENT: Negative for nosebleeds and sore throat.   Eyes: Negative for double vision.  Respiratory: Negative for cough, hemoptysis, sputum production, shortness of breath and wheezing.   Cardiovascular: Negative for chest pain, palpitations, orthopnea and leg swelling.   Gastrointestinal: Negative for abdominal pain, blood in stool, constipation, diarrhea, heartburn, melena, nausea and vomiting.  Genitourinary: Negative for dysuria, frequency and urgency.  Musculoskeletal: Positive for back pain and joint pain.  Skin: Negative.  Negative for itching and rash.  Neurological: Negative for dizziness, tingling, focal weakness, weakness and headaches.  Endo/Heme/Allergies: Does not bruise/bleed easily.  Psychiatric/Behavioral: Negative for depression. The patient is not nervous/anxious and does not have insomnia.       PAST MEDICAL HISTORY :  Past Medical History:  Diagnosis Date  . Anemia    B12, d2 and magnesium deficiencies  . Anxiety   . Arthritis    OSTEOARTHRITIS  . Breast cancer of upper-outer quadrant of right female breast ([place]) 06/10/2014   10 mm invasive mammary carcinoma, T1b,Nx; ER 90%, 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 extensive intermediate grade DCIS.   [name] Constipation   . 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 DDD (degenerative disc disease), lumbar   . Dyspnea   . GERD (gastroesophageal reflux disease)   . Heart murmur   . History of blood transfusion   . Hyperlipidemia   . Hypertension   . Personal history of chemotherapy 2016   right breast ca  . Personal history of radiation therapy 2016   mammosite  . SOB (shortness of breath) 12/03/2014    PAST SURGICAL HISTORY :   Past Surgical History:  Procedure Laterality Date  . ABDOMINAL HYSTERECTOMY    . AXILLARY LYMPH NODE BIOPSY Right 07/14/2014   Procedure: AXILLARY LYMPH NODE BIOPSY;  Surgeon: Robert Bellow, MD;  Location: [facility] ORS;  Service: General;  Laterality: Right;  . BOTOX INJECTION N/A 01/13/2020   Procedure: BOTOX INJECTION;  Surgeon: Hollice Espy, MD;  Location: [facility] ORS;  Service: Urology;  Laterality: N/A;  . BREAST BIOPSY Left 2012   benign  . BREAST BIOPSY Right 05/26/2014   DCIS   . BREAST EXCISIONAL BIOPSY Right 06/28/2017    ULCERATED SKIN  WITH UNDERLYING FAT NECROSIS, ACUTE INFLAMMATION AND REACTIVE EPITHELIAL ATYPIA  . BREAST LUMPECTOMY Right 06/10/2014   DCIS and Invasive ductal carcinoma, clear margins  . BREAST SURGERY Right 06/10/14   Wide excision for intermediate grade DCIS, identification of a 10  millimeter area of invasive mammary carcinoma.  . CHOLECYSTECTOMY  2012  . COLONOSCOPY  2009  . EYE SURGERY Bilateral 2014   cataract  . JOINT REPLACEMENT Left 2001   knee  . JOINT REPLACEMENT Right 2003   knee  . JOINT REPLACEMENT Left 2004   replacement joint broken  . kneee surgery Left   . PORT-A-CATH REMOVAL  02/11/2016   Dr Bary Castilla  . PORTACATH PLACEMENT Left 08/27/2014   Procedure: INSERTION PORT-A-CATH;  Surgeon: Robert Bellow, MD;  Location: [facility] ORS;  Service: General;  Laterality: Left;    FAMILY HISTORY :   Family History  Problem Relation Age of Onset  . Breast cancer Neg Hx     SOCIAL HISTORY:   Social History   Tobacco Use  . Smoking status: Never Smoker  . Smokeless tobacco: Never Used  Substance Use Topics  . Alcohol use: No    Alcohol/week: 0.0 standard drinks  . Drug use: No    ALLERGIES:  is allergic to tape.  MEDICATIONS:  Current Outpatient Medications  Medication Sig Dispense Refill  . aspirin EC 81 MG tablet Take 81 mg by mouth daily.    . calcium carbonate (OS-CAL) 600 MG TABS tablet Take 600 mg by mouth daily.    . cyanocobalamin 1000 MCG tablet Take 1,000 mcg by mouth daily.    . cyclobenzaprine (FLEXERIL) 10 MG tablet 1/2-1 po qHS prn    . fexofenadine (ALLEGRA) 180 MG tablet Take 180 mg by mouth daily.    . Glucosamine-Chondroit-Vit C-Mn (GLUCOSAMINE 1500 COMPLEX PO) Take 1 tablet by mouth daily.     . hydrochlorothiazide (HYDRODIURIL) 25 MG tablet Take 25 mg by mouth daily.     [name] HYDROcodone-acetaminophen (NORCO) 7.5-325 MG tablet Take 1 tablet by mouth 2 (two) times daily as needed for pain.  0  . lansoprazole (PREVACID) 15 MG capsule Take 15 mg by mouth every  morning.     . Magnesium Oxide 500 MG TABS Take by mouth.     . metoprolol succinate (TOPROL-XL) 50 MG 24 hr tablet Take 50 mg by mouth every morning. Take with or immediately following a meal.    . Multiple Vitamin (MULTIVITAMIN WITH MINERALS) TABS tablet Take 1 tablet by mouth daily.    . simvastatin (ZOCOR) 40 MG tablet Take 40 mg by mouth at bedtime.     . tamoxifen (NOLVADEX) 20 MG tablet TAKE 1 TABLET BY MOUTH  DAILY (Patient taking differently: Take 20 mg by mouth daily. In the am) 90 tablet 3  . venlafaxine XR (EFFEXOR-XR) 75 MG 24 hr capsule TAKE 1 CAPSULE BY MOUTH  DAILY WITH BREAKFAST (Patient taking differently: Take 75 mg by mouth daily with breakfast.) 90 capsule 3  . Vitamin D, Ergocalciferol, (DRISDOL) 1.25 MG (50000 UNIT) CAPS capsule TAKE 1 CAPSULE BY MOUTH  ONCE WEEKLY (Patient taking differently: Take 50,000  Units by mouth every 7 (seven) days. Takes on Mondays) 12 capsule 3  . fluticasone (FLONASE) 50 MCG/ACT nasal spray Place 1 spray into both nostrils daily. (Patient not taking: No sig reported) 16 g 2  . meloxicam (MOBIC) 15 MG tablet Take 1 tablet (15 mg total) by mouth daily as needed for pain. (Patient not taking: No sig reported) 30 tablet 3   No current facility-administered medications for this visit.   Facility-Administered Medications Ordered in Other Visits  Medication Dose Route Frequency Provider Last Rate Last Admin  . sodium chloride 0.9 % injection 10 mL  10 mL Intravenous PRN Choksi, Janak, MD   10 mL at 01/27/15 1416  . sodium chloride flush (NS) 0.9 % injection 10 mL  10 mL Intravenous PRN Cammie Sickle, MD   10 mL at 12/10/15 1347    PHYSICAL EXAMINATION: ECOG PERFORMANCE STATUS: 0 - Asymptomatic  BP 138/75 (BP Location: Right Arm, Patient Position: Sitting)   Pulse 97   Temp (!) 96.7 F (35.9 C) (Tympanic)   Resp 18   Wt 281 lb 4.8 oz (127.6 kg)   SpO2 95%   BMI 51.45 kg/m   Filed Weights   02/07/20 1054  Weight: 281 lb 4.8 oz  (127.6 kg)   Physical Exam Constitutional:      Comments: Obese. Alone.   HENT:     Head: Normocephalic and atraumatic.     Mouth/Throat:     Pharynx: No oropharyngeal exudate.  Eyes:     Pupils: Pupils are equal, round, and reactive to light.  Cardiovascular:     Rate and Rhythm: Normal rate and regular rhythm.  Pulmonary:     Effort: No respiratory distress.     Breath sounds: Normal breath sounds. No wheezing.  Abdominal:     General: Bowel sounds are normal. There is no distension.     Palpations: Abdomen is soft. There is no mass.     Tenderness: There is no abdominal tenderness. There is no guarding or rebound.  Musculoskeletal:        General: No tenderness. Normal range of motion.     Cervical back: Normal range of motion and neck supple.  Skin:    General: Skin is warm.  Neurological:     Mental Status: She is alert and oriented to person, place, and time.  Psychiatric:        Mood and Affect: Affect normal.        LABORATORY DATA:  I have reviewed the data as listed    Component Value Date/Time   NA 139 02/07/2020 1000   NA 141 06/05/2014 0843   K 3.0 (L) 02/07/2020 1000   K 3.5 06/05/2014 0843   CL 99 02/07/2020 1000   CL 105 06/05/2014 0843   CO2 27 02/07/2020 1000   CO2 27 06/05/2014 0843   GLUCOSE 120 (H) 02/07/2020 1000   GLUCOSE 94 06/05/2014 0843   BUN 16 02/07/2020 1000   BUN 22 (H) 06/05/2014 0843   CREATININE 1.00 02/07/2020 1000   CREATININE 0.97 06/05/2014 0843   CALCIUM 8.8 (L) 02/07/2020 1000   CALCIUM 9.2 06/05/2014 0843   PROT 6.7 02/07/2020 1000   ALBUMIN 3.8 02/07/2020 1000   AST 24 02/07/2020 1000   ALT 17 02/07/2020 1000   ALKPHOS 46 02/07/2020 1000   BILITOT 1.2 02/07/2020 1000   GFRNONAA 58 (L) 02/07/2020 1000   GFRNONAA 58 (L) 06/05/2014 0843   GFRAA 44 (L) 08/05/2019 1315   GFRAA >  60 06/05/2014 0843    No results found for: SPEP, UPEP  Lab Results  Component Value Date   WBC 10.4 02/07/2020   NEUTROABS 6.2  02/07/2020   HGB 14.3 02/07/2020   HCT 41.9 02/07/2020   MCV 93.7 02/07/2020   PLT 191 02/07/2020      Chemistry      Component Value Date/Time   NA 139 02/07/2020 1000   NA 141 06/05/2014 0843   K 3.0 (L) 02/07/2020 1000   K 3.5 06/05/2014 0843   CL 99 02/07/2020 1000   CL 105 06/05/2014 0843   CO2 27 02/07/2020 1000   CO2 27 06/05/2014 0843   BUN 16 02/07/2020 1000   BUN 22 (H) 06/05/2014 0843   CREATININE 1.00 02/07/2020 1000   CREATININE 0.97 06/05/2014 0843      Component Value Date/Time   CALCIUM 8.8 (L) 02/07/2020 1000   CALCIUM 9.2 06/05/2014 0843   ALKPHOS 46 02/07/2020 1000   AST 24 02/07/2020 1000   ALT 17 02/07/2020 1000   BILITOT 1.2 02/07/2020 1000       RADIOGRAPHIC STUDIES: I have personally reviewed the radiological images as listed and agreed with the findings in the report. No results found.   ASSESSMENT & PLAN:  Carcinoma of overlapping sites of right breast in female, estrogen receptor positive ([place]) # Breast cancer ER/PR/her 2 Neu-POSITIVE; stage I- on tamoxifen [s/p TAH].  STABLE. July 2021-mammo-WNL. STABLE; no evidnce of recurrence; discussed re: extended- will continue for now.   # Hot flashes grade 1-STABLE Continue Effexor.   # fatigue- ? OSA [could not tolerate sleep study]- defer tp PCP.   # DEC 2021-- BMD- osteopenia- -1.3 [stable from 2018];Continue calcium daily. STABLE.  Reviewed results of the bone density.  # Hypokalmeia- 3.0; ? HCTZ; defer to PCP.;  Patient on K-Dur.  # DISPOSITION:   # follow up in 6 months- MD/labs-cbc/cmpDr.B   Orders Placed This Encounter  Procedures  . CBC    Standing Status:   Future    Standing Expiration Date:   02/06/2021  . Comprehensive metabolic panel    Standing Status:   Future    Standing Expiration Date:   02/06/2021   All questions were answered. The patient knows to call the clinic with any problems, questions or concerns.      Cammie Sickle, MD 02/07/2020 5:14 PM

## 2020-02-07 NOTE — Progress Notes (Signed)
Patient here for oncology follow-up appointment, expresses no complaints or concerns at this time.    

## 2020-03-03 ENCOUNTER — Other Ambulatory Visit: Payer: Self-pay

## 2020-03-03 ENCOUNTER — Ambulatory Visit (INDEPENDENT_AMBULATORY_CARE_PROVIDER_SITE_OTHER): Payer: Medicare Other | Admitting: Urology

## 2020-03-03 ENCOUNTER — Encounter: Payer: Self-pay | Admitting: Urology

## 2020-03-03 VITALS — BP 138/82 | HR 95 | Ht 62.0 in | Wt 281.0 lb

## 2020-03-03 DIAGNOSIS — N3941 Urge incontinence: Secondary | ICD-10-CM | POA: Diagnosis not present

## 2020-03-03 DIAGNOSIS — N3281 Overactive bladder: Secondary | ICD-10-CM

## 2020-03-03 DIAGNOSIS — F418 Other specified anxiety disorders: Secondary | ICD-10-CM

## 2020-03-03 LAB — BLADDER SCAN AMB NON-IMAGING: Scan Result: 0

## 2020-03-03 MED ORDER — DIAZEPAM 10 MG PO TABS: 10.0000 mg | ORAL_TABLET | Freq: Once | ORAL | 0 refills | Status: AC

## 2020-03-03 NOTE — Progress Notes (Signed)
03/03/2020 9:58 AM   Drucie Opitz 1941/01/26 [id number]  Referring provider: Jerl Mina, MD [address] [facility] [place],  [place] [address]  Chief Complaint  Patient presents with  . Follow-up    HPI: 80 year old female with personal history of severe OAB/urge incontinence returns today to follow-up Botox.  She underwent Botox injection on January 13, 2020 after failing Myrbetriq and Gala Murdoch.  Today, she is very pleased with her urinary symptoms.  She has only had one single episode of gross incontinence following administration of Botox.  She also has increased ability to get to the bathroom on time and less urinary frequency.  Some days, she only wears a pad for safety.  This is allowed her to enjoy activities which she was previously not able to engage in.  PVR today 0.   PMH: Past Medical History:  Diagnosis Date  . Anemia    B12, d2 and magnesium deficiencies  . Anxiety   . Arthritis    OSTEOARTHRITIS  . Breast cancer of upper-outer quadrant of right female breast ([facility]) 06/10/2014   10 mm invasive mammary carcinoma, T1b,Nx; ER 90%, 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 extensive intermediate grade DCIS.   [name] Constipation   . 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 DDD (degenerative disc disease), lumbar   . Dyspnea   . GERD (gastroesophageal reflux disease)   . Heart murmur   . History of blood transfusion   . Hyperlipidemia   . Hypertension   . Personal history of chemotherapy 2016   right breast ca  . Personal history of radiation therapy 2016   mammosite  . SOB (shortness of breath) 12/03/2014    Surgical History: Past Surgical History:  Procedure Laterality Date  . ABDOMINAL HYSTERECTOMY    . AXILLARY LYMPH NODE BIOPSY Right 07/14/2014   Procedure: AXILLARY LYMPH NODE BIOPSY;  Surgeon: Earline Mayotte, MD;  Location: [facility] ORS;  Service: General;  Laterality: Right;  . BOTOX INJECTION N/A 01/13/2020   Procedure: BOTOX INJECTION;   Surgeon: Vanna Scotland, MD;  Location: [facility] ORS;  Service: Urology;  Laterality: N/A;  . BREAST BIOPSY Left 2012   benign  . BREAST BIOPSY Right 05/26/2014   DCIS   . BREAST EXCISIONAL BIOPSY Right 06/28/2017    ULCERATED SKIN WITH UNDERLYING FAT NECROSIS, ACUTE INFLAMMATION AND REACTIVE EPITHELIAL ATYPIA  . BREAST LUMPECTOMY Right 06/10/2014   DCIS and Invasive ductal carcinoma, clear margins  . BREAST SURGERY Right 06/10/14   Wide excision for intermediate grade DCIS, identification of a 10  millimeter area of invasive mammary carcinoma.  . CHOLECYSTECTOMY  2012  . COLONOSCOPY  2009  . EYE SURGERY Bilateral 2014   cataract  . JOINT REPLACEMENT Left 2001   knee  . JOINT REPLACEMENT Right 2003   knee  . JOINT REPLACEMENT Left 2004   replacement joint broken  . kneee surgery Left   . PORT-A-CATH REMOVAL  02/11/2016   Dr Lemar Livings  . PORTACATH PLACEMENT Left 08/27/2014   Procedure: INSERTION PORT-A-CATH;  Surgeon: Earline Mayotte, MD;  Location: [facility] ORS;  Service: General;  Laterality: Left;    Home Medications:  Allergies as of 03/03/2020      Reactions   Tape Other (See Comments)   Blisters on skin from tape during recent surgery. PAPER TAPE IS OKAY      Medication List       Accurate as of March 03, 2020  9:58 AM. If you have any  questions, ask your nurse or doctor.        aspirin EC 81 MG tablet Take 81 mg by mouth daily.   calcium carbonate 600 MG Tabs tablet Commonly known as: OS-CAL Take 600 mg by mouth daily.   cyanocobalamin 1000 MCG tablet Take 1,000 mcg by mouth daily.   cyclobenzaprine 10 MG tablet Commonly known as: FLEXERIL 1/2-1 po qHS prn   fexofenadine 180 MG tablet Commonly known as: ALLEGRA Take 180 mg by mouth daily.   fluticasone 50 MCG/ACT nasal spray Commonly known as: Flonase Place 1 spray into both nostrils daily.   GLUCOSAMINE 1500 COMPLEX PO Take 1 tablet by mouth daily.   hydrochlorothiazide 25 MG tablet Commonly known as:  HYDRODIURIL Take 25 mg by mouth daily.   HYDROcodone-acetaminophen 7.5-325 MG tablet Commonly known as: NORCO Take 1 tablet by mouth 2 (two) times daily as needed for pain.   lansoprazole 15 MG capsule Commonly known as: PREVACID Take 15 mg by mouth every morning.   Magnesium Oxide 500 MG Tabs Take by mouth.   meloxicam 15 MG tablet Commonly known as: MOBIC Take 1 tablet (15 mg total) by mouth daily as needed for pain.   metoprolol succinate 50 MG 24 hr tablet Commonly known as: TOPROL-XL Take 50 mg by mouth every morning. Take with or immediately following a meal.   multivitamin with minerals Tabs tablet Take 1 tablet by mouth daily.   simvastatin 40 MG tablet Commonly known as: ZOCOR Take 40 mg by mouth at bedtime.   tamoxifen 20 MG tablet Commonly known as: NOLVADEX TAKE 1 TABLET BY MOUTH  DAILY What changed: additional instructions   venlafaxine XR 75 MG 24 hr capsule Commonly known as: EFFEXOR-XR TAKE 1 CAPSULE BY MOUTH  DAILY WITH BREAKFAST What changed: See the new instructions.   Vitamin D (Ergocalciferol) 1.25 MG (50000 UNIT) Caps capsule Commonly known as: DRISDOL TAKE 1 CAPSULE BY MOUTH  ONCE WEEKLY What changed:   when to take this  additional instructions       Allergies:  Allergies  Allergen Reactions  . Tape Other (See Comments)    Blisters on skin from tape during recent surgery. PAPER TAPE IS OKAY    Family History: Family History  Problem Relation Age of Onset  . Breast cancer Neg Hx     Social History:  reports that she has never smoked. She has never used smokeless tobacco. She reports that she does not drink alcohol and does not use drugs.   Physical Exam: BP 138/82   Pulse 95   Ht 5\' 2"  (1.575 m)   Wt 281 lb (127.5 kg)   BMI 51.40 kg/m   Constitutional:  Alert and oriented, No acute distress. HEENT: [place] AT, moist mucus membranes.  Trachea midline, no masses. Cardiovascular: No clubbing, cyanosis, or edema. Respiratory:  Normal respiratory effort, no increased work of breathing. Skin: No rashes, bruises or suspicious lesions. Neurologic: Grossly intact, no focal deficits, moving all 4 extremities. Psychiatric: Normal mood and affect.  Results for orders placed or performed in visit on 03/03/20  BLADDER SCAN AMB NON-IMAGING  Result Value Ref Range   Scan Result 0 ml     Assessment & Plan:    1. Urge incontinence Overall doing very well with Botox, emptying well  She would like to continue this medication  We will plan to perform the next procedure in office.  We discussed our protocol for this including necessity for UA/urine culture a week prior to the  procedure.  We discussed intravesical lidocaine administration.  She reports today that she has some anxiety about the procedure and sometimes needs some anxiolytic medication.  She would like a preprocedural Valium which was prescribed.  She will have a driver.  Risk and benefits of Botox were reviewed again in detail.  All questions answered. - BLADDER SCAN AMB NON-IMAGING  2. Overactive bladder As above  3. Situational anxiety Valium 10 mg   Schedule botox for May 2022 (in office)  Hollice Espy, MD  [facility] [address] [place], [place] [address] [phone]

## 2020-04-16 ENCOUNTER — Other Ambulatory Visit: Payer: Self-pay | Admitting: Internal Medicine

## 2020-05-17 ENCOUNTER — Other Ambulatory Visit: Payer: Self-pay | Admitting: Internal Medicine

## 2020-06-16 ENCOUNTER — Other Ambulatory Visit: Payer: Self-pay | Admitting: *Deleted

## 2020-06-16 DIAGNOSIS — N3941 Urge incontinence: Secondary | ICD-10-CM

## 2020-07-07 ENCOUNTER — Other Ambulatory Visit: Payer: Self-pay

## 2020-07-07 ENCOUNTER — Other Ambulatory Visit: Payer: Medicare Other

## 2020-07-07 DIAGNOSIS — N3941 Urge incontinence: Secondary | ICD-10-CM

## 2020-07-08 LAB — URINALYSIS, COMPLETE
Bilirubin, UA: NEGATIVE
Glucose, UA: NEGATIVE
Ketones, UA: NEGATIVE
Leukocytes,UA: NEGATIVE
Nitrite, UA: NEGATIVE
Protein,UA: NEGATIVE
RBC, UA: NEGATIVE
Specific Gravity, UA: 1.02 (ref 1.005–1.030)
Urobilinogen, Ur: 0.2 mg/dL (ref 0.2–1.0)
pH, UA: 6 (ref 5.0–7.5)

## 2020-07-08 LAB — MICROSCOPIC EXAMINATION
Bacteria, UA: NONE SEEN
RBC, Urine: NONE SEEN /hpf (ref 0–2)

## 2020-07-13 LAB — CULTURE, URINE COMPREHENSIVE

## 2020-07-14 ENCOUNTER — Ambulatory Visit: Payer: Medicare Other | Admitting: Urology

## 2020-07-14 ENCOUNTER — Other Ambulatory Visit: Payer: Self-pay

## 2020-07-14 ENCOUNTER — Encounter: Payer: Self-pay | Admitting: Urology

## 2020-07-14 VITALS — BP 148/66 | HR 72

## 2020-07-14 DIAGNOSIS — N3941 Urge incontinence: Secondary | ICD-10-CM | POA: Diagnosis not present

## 2020-07-14 MED ORDER — ONABOTULINUMTOXINA 100 UNITS IJ SOLR
100.0000 [IU] | Freq: Once | INTRAMUSCULAR | Status: AC
  Administered 2020-07-14: 100 [IU] via INTRAMUSCULAR

## 2020-07-14 MED ORDER — SULFAMETHOXAZOLE-TRIMETHOPRIM 800-160 MG PO TABS
1.0000 | ORAL_TABLET | Freq: Once | ORAL | Status: AC
  Administered 2020-07-14: 1 via ORAL

## 2020-07-14 MED ORDER — LIDOCAINE HCL 2 % IJ SOLN
60.0000 mL | Freq: Once | INTRAMUSCULAR | Status: AC
  Administered 2020-07-14: 1200 mg

## 2020-07-14 NOTE — Progress Notes (Signed)
Bladder Instillation  Due to Botox patient is present today for a Bladder Instillation of 6ml 2% lidocaine. Patient was cleaned and prepped in a sterile fashion with betadine and lidocaine 2% jelly was instilled into the urethra.  A 14FR catheter was inserted, urine return was noted 3ml, urine was yellow in color.  60 ml was instilled into the bladder. The catheter was then removed. Patient tolerated well, no complications were noted Patient held in bladder for 30 minutes prior to procedure starting.   Performed by: Verlene Mayer, [place]

## 2020-07-14 NOTE — Progress Notes (Signed)
   07/14/20  CC:  Chief Complaint  Patient presents with  . Botulinum Toxin Injection    HPI: 80 year old female with a personal history of severe OAB managed now on Botox 100 units who presents to the office today for treatment.  She underwent her initial Botox injection in the operating room on 12/2019 with significant improvement in her urinary symptoms.  She reports today that over the last month or so, she believes the medication is wearing off and she having increasing accidents.  She is very anxious to get her medication today.  She did receive periprocedural antibiotics in the form of Bactrim today.  Blood pressure (!) 148/66, pulse 72. NED. A&Ox3.   No respiratory distress   Abd soft, NT, ND Normal external genitalia with patent urethral meatus  Prior to undergoing the procedure today, she did have lidocaine instilled in the bladder for 30 minutes which allowed to dwell.  Please see CMA note for details.  Cystoscopy Procedure Note  Patient identification was confirmed, informed consent was obtained, and patient was prepped using Betadine solution.  Lidocaine jelly was administered per urethral meatus.    Procedure: - Flexible cystoscope introduced, without any difficulty.   - Thorough search of the bladder revealed:    normal urethral meatus    normal urothelium    no stones    no ulcers     no tumors    no urethral polyps    no trabeculation  - Ureteral orifices were normal in position and appearance.  This point time, using a flexible needle at the #4 position, we injected total of 100 units of Botox in 2 rows of 5 injections for total of 10 injections, 1 cc each.  This was well-tolerated.  There is minimal bleeding.  Post-Procedure: - Patient tolerated the procedure well  Assessment/ Plan:  1. Urge incontinence Status post uncomplicated Botox injection today  We discussed postprocedural warning symptoms  We will tentatively plan for next Botox  injection in about 6 months, will call earlier if she feels like her treatment is wearing off or is having difficulty urinating - Urinalysis, Complete - lidocaine (XYLOCAINE) 2 % (with pres) injection 1,200 mg - botulinum toxin Type A (BOTOX) injection 100 Units - sulfamethoxazole-trimethoprim (BACTRIM DS) 800-160 MG per tablet 1 tablet  Hollice Espy, MD

## 2020-07-14 NOTE — Patient Instructions (Signed)
   Step 1 Get all of your supplies ready and place near you. Step 2 Wash your hands or put on gloves. Step 3 Wash around urethral opening with warm antibacterial/ hypoallergenic soapy water from front to back. Step 4 take the catheter out of the package and drain the lubricant over toilet. Step 5 Sit on the toilet and spread your legs to begin catheterization. Step 6 Use your fingers to spread the labia and feel for the urethra.             A MIRROR MAY BE HELPFUL AT FIRST Step 7 Insert the catheter slowly into the urethra. If there is resistance when the catheter reaches the the sphincter muscle,              take a deep breath and gently apply steady pressure.            DO NOT FORCE THE CATHETER Step 8 When the urine begins to flow insert another inch, allow the urine to flow into the toilet. Step 9 When the flow of urine stops, slowly remove the catheter.  

## 2020-07-16 LAB — URINALYSIS, COMPLETE
Bilirubin, UA: NEGATIVE
Glucose, UA: NEGATIVE
Ketones, UA: NEGATIVE
Leukocytes,UA: NEGATIVE
Nitrite, UA: NEGATIVE
Protein,UA: NEGATIVE
RBC, UA: NEGATIVE
Specific Gravity, UA: 1.015 (ref 1.005–1.030)
Urobilinogen, Ur: 0.2 mg/dL (ref 0.2–1.0)
pH, UA: 6.5 (ref 5.0–7.5)

## 2020-07-16 LAB — MICROSCOPIC EXAMINATION
Bacteria, UA: NONE SEEN
RBC, Urine: NONE SEEN /hpf (ref 0–2)

## 2020-07-28 ENCOUNTER — Other Ambulatory Visit: Payer: Self-pay

## 2020-07-28 ENCOUNTER — Encounter: Payer: Self-pay | Admitting: Physician Assistant

## 2020-07-28 ENCOUNTER — Ambulatory Visit (INDEPENDENT_AMBULATORY_CARE_PROVIDER_SITE_OTHER): Payer: Medicare Other | Admitting: Physician Assistant

## 2020-07-28 VITALS — BP 130/81 | HR 92 | Ht 62.0 in | Wt 260.0 lb

## 2020-07-28 DIAGNOSIS — N3941 Urge incontinence: Secondary | ICD-10-CM

## 2020-07-28 LAB — BLADDER SCAN AMB NON-IMAGING: Scan Result: 0

## 2020-07-28 NOTE — Progress Notes (Signed)
07/28/2020 1:27 PM   Maria Gutierrez 1941-01-06 [id number]  CC: Chief Complaint  Patient presents with  . Urinary Incontinence   HPI: Maria Gutierrez is a 80 y.o. female with PMH severe OAB managed with intravesical Botox who underwent repeat Botox treatment with Dr. Erlene Quan 14 days ago who presents today for symptom recheck and PVR.   Today she reports significant improvement in her urinary symptoms following her most recent intravesical Botox treatment.  She states her symptoms this time are even better than after her first treatment.  She is able to be dry for 1 to 2 days at a time without having a leak.  She denies dysuria and gross hematuria and is overall very pleased.  PVR 0 mL.  PMH: Past Medical History:  Diagnosis Date  . Anemia    B12, d2 and magnesium deficiencies  . Anxiety   . Arthritis    OSTEOARTHRITIS  . Breast cancer of upper-outer quadrant of right female breast ([place]) 06/10/2014   10 mm invasive mammary carcinoma, T1b,Nx; ER 90%, 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 extensive intermediate grade DCIS.   [name] Constipation   . 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 DDD (degenerative disc disease), lumbar   . Dyspnea   . GERD (gastroesophageal reflux disease)   . Heart murmur   . History of blood transfusion   . Hyperlipidemia   . Hypertension   . Personal history of chemotherapy 2016   right breast ca  . Personal history of radiation therapy 2016   mammosite  . SOB (shortness of breath) 12/03/2014    Surgical History: Past Surgical History:  Procedure Laterality Date  . ABDOMINAL HYSTERECTOMY    . AXILLARY LYMPH NODE BIOPSY Right 07/14/2014   Procedure: AXILLARY LYMPH NODE BIOPSY;  Surgeon: Robert Bellow, MD;  Location: [facility] ORS;  Service: General;  Laterality: Right;  . BOTOX INJECTION N/A 01/13/2020   Procedure: BOTOX INJECTION;  Surgeon: Hollice Espy, MD;  Location: [facility] ORS;  Service: Urology;  Laterality: N/A;  . BREAST BIOPSY Left 2012    benign  . BREAST BIOPSY Right 05/26/2014   DCIS   . BREAST EXCISIONAL BIOPSY Right 06/28/2017    ULCERATED SKIN WITH UNDERLYING FAT NECROSIS, ACUTE INFLAMMATION AND REACTIVE EPITHELIAL ATYPIA  . BREAST LUMPECTOMY Right 06/10/2014   DCIS and Invasive ductal carcinoma, clear margins  . BREAST SURGERY Right 06/10/14   Wide excision for intermediate grade DCIS, identification of a 10  millimeter area of invasive mammary carcinoma.  . CHOLECYSTECTOMY  2012  . COLONOSCOPY  2009  . EYE SURGERY Bilateral 2014   cataract  . JOINT REPLACEMENT Left 2001   knee  . JOINT REPLACEMENT Right 2003   knee  . JOINT REPLACEMENT Left 2004   replacement joint broken  . kneee surgery Left   . PORT-A-CATH REMOVAL  02/11/2016   Dr Bary Castilla  . PORTACATH PLACEMENT Left 08/27/2014   Procedure: INSERTION PORT-A-CATH;  Surgeon: Robert Bellow, MD;  Location: [facility] ORS;  Service: General;  Laterality: Left;    Home Medications:  Allergies as of 07/28/2020      Reactions   Tape Other (See Comments)   Blisters on skin from tape during recent surgery. PAPER TAPE IS OKAY      Medication List       Accurate as of Jul 28, 2020  1:27 PM. If you have any questions, ask your nurse or doctor.        aspirin EC  81 MG tablet Take 81 mg by mouth daily.   calcium carbonate 600 MG Tabs tablet Commonly known as: OS-CAL Take 600 mg by mouth daily.   cyanocobalamin 1000 MCG tablet Take 1,000 mcg by mouth daily.   cyclobenzaprine 10 MG tablet Commonly known as: FLEXERIL 1/2-1 po qHS prn   fexofenadine 180 MG tablet Commonly known as: ALLEGRA Take 180 mg by mouth daily.   fluticasone 50 MCG/ACT nasal spray Commonly known as: Flonase Place 1 spray into both nostrils daily.   GLUCOSAMINE 1500 COMPLEX PO Take 1 tablet by mouth daily.   hydrochlorothiazide 25 MG tablet Commonly known as: HYDRODIURIL Take 25 mg by mouth daily.   HYDROcodone-acetaminophen 7.5-325 MG tablet Commonly known as: NORCO Take  1 tablet by mouth 2 (two) times daily as needed for pain.   lansoprazole 15 MG capsule Commonly known as: PREVACID Take 15 mg by mouth every morning.   Magnesium Oxide 500 MG Tabs Take by mouth.   meloxicam 15 MG tablet Commonly known as: MOBIC Take 1 tablet (15 mg total) by mouth daily as needed for pain.   metoprolol succinate 50 MG 24 hr tablet Commonly known as: TOPROL-XL Take 50 mg by mouth every morning. Take with or immediately following a meal.   multivitamin with minerals Tabs tablet Take 1 tablet by mouth daily.   simvastatin 40 MG tablet Commonly known as: ZOCOR Take 40 mg by mouth at bedtime.   tamoxifen 20 MG tablet Commonly known as: NOLVADEX TAKE 1 TABLET BY MOUTH  DAILY   venlafaxine XR 75 MG 24 hr capsule Commonly known as: EFFEXOR-XR TAKE 1 CAPSULE BY MOUTH  DAILY WITH BREAKFAST   Vitamin D (Ergocalciferol) 1.25 MG (50000 UNIT) Caps capsule Commonly known as: DRISDOL Take 1 capsule (50,000 Units total) by mouth every 7 (seven) days. Takes on Mondays       Allergies:  Allergies  Allergen Reactions  . Tape Other (See Comments)    Blisters on skin from tape during recent surgery. PAPER TAPE IS OKAY    Family History: Family History  Problem Relation Age of Onset  . Breast cancer Neg Hx     Social History:   reports that she has never smoked. She has never used smokeless tobacco. She reports that she does not drink alcohol and does not use drugs.  Physical Exam: BP 130/81   Pulse 92   Ht 5\' 2"  (1.575 m)   Wt 260 lb (117.9 kg)   BMI 47.55 kg/m   Constitutional:  Alert and oriented, no acute distress, nontoxic appearing HEENT: [place], AT Cardiovascular: No clubbing, cyanosis, or edema Respiratory: Normal respiratory effort, no increased work of breathing Skin: No rashes, bruises or suspicious lesions Neurologic: Grossly intact, no focal deficits, moving all 4 extremities Psychiatric: Normal mood and affect  Laboratory Data: Results for  orders placed or performed in visit on 07/28/20  Bladder Scan (Post Void Residual) in office  Result Value Ref Range   Scan Result 0    Assessment & Plan:   1. Urge incontinence Significant improvement following intravesical Botox.  PVR 0 mL and not clinically infected today.  We will plan for symptom recheck and repeat Botox planning in approximately 5 months.  Patient is in agreement with this plan. - Bladder Scan (Post Void Residual) in office  Return in about 5 months (around 12/28/2020) for Post-Botox sx recheck and scheduling.  Debroah Loop, PA-C  [facility] [address] [place],  [address] [phone]  [phone]    

## 2020-08-07 ENCOUNTER — Other Ambulatory Visit: Payer: Medicare Other

## 2020-08-07 ENCOUNTER — Ambulatory Visit: Payer: Medicare Other | Admitting: Internal Medicine

## 2020-08-26 ENCOUNTER — Other Ambulatory Visit: Payer: Self-pay | Admitting: *Deleted

## 2020-08-26 DIAGNOSIS — Z170 Estrogen receptor positive status [ER+]: Secondary | ICD-10-CM

## 2020-08-27 ENCOUNTER — Inpatient Hospital Stay: Payer: Medicare Other | Attending: Internal Medicine

## 2020-08-27 ENCOUNTER — Encounter (INDEPENDENT_AMBULATORY_CARE_PROVIDER_SITE_OTHER): Payer: Self-pay

## 2020-08-27 ENCOUNTER — Inpatient Hospital Stay (HOSPITAL_BASED_OUTPATIENT_CLINIC_OR_DEPARTMENT_OTHER): Payer: Medicare Other | Admitting: Internal Medicine

## 2020-08-27 DIAGNOSIS — F419 Anxiety disorder, unspecified: Secondary | ICD-10-CM | POA: Insufficient documentation

## 2020-08-27 DIAGNOSIS — R011 Cardiac murmur, unspecified: Secondary | ICD-10-CM | POA: Diagnosis not present

## 2020-08-27 DIAGNOSIS — M8580 Other specified disorders of bone density and structure, unspecified site: Secondary | ICD-10-CM | POA: Insufficient documentation

## 2020-08-27 DIAGNOSIS — I10 Essential (primary) hypertension: Secondary | ICD-10-CM | POA: Insufficient documentation

## 2020-08-27 DIAGNOSIS — Z7982 Long term (current) use of aspirin: Secondary | ICD-10-CM | POA: Diagnosis not present

## 2020-08-27 DIAGNOSIS — K219 Gastro-esophageal reflux disease without esophagitis: Secondary | ICD-10-CM | POA: Insufficient documentation

## 2020-08-27 DIAGNOSIS — C50811 Malignant neoplasm of overlapping sites of right female breast: Secondary | ICD-10-CM | POA: Insufficient documentation

## 2020-08-27 DIAGNOSIS — M5136 Other intervertebral disc degeneration, lumbar region: Secondary | ICD-10-CM | POA: Insufficient documentation

## 2020-08-27 DIAGNOSIS — M1990 Unspecified osteoarthritis, unspecified site: Secondary | ICD-10-CM | POA: Diagnosis not present

## 2020-08-27 DIAGNOSIS — Z170 Estrogen receptor positive status [ER+]: Secondary | ICD-10-CM | POA: Insufficient documentation

## 2020-08-27 DIAGNOSIS — Z79810 Long term (current) use of selective estrogen receptor modulators (SERMs): Secondary | ICD-10-CM | POA: Insufficient documentation

## 2020-08-27 DIAGNOSIS — Z79899 Other long term (current) drug therapy: Secondary | ICD-10-CM | POA: Insufficient documentation

## 2020-08-27 LAB — CBC WITH DIFFERENTIAL/PLATELET
Abs Immature Granulocytes: 0.08 10*3/uL — ABNORMAL HIGH (ref 0.00–0.07)
Basophils Absolute: 0 10*3/uL (ref 0.0–0.1)
Basophils Relative: 0 %
Eosinophils Absolute: 0 10*3/uL (ref 0.0–0.5)
Eosinophils Relative: 0 %
HCT: 41.5 % (ref 36.0–46.0)
Hemoglobin: 14.1 g/dL (ref 12.0–15.0)
Immature Granulocytes: 1 %
Lymphocytes Relative: 14 %
Lymphs Abs: 2 10*3/uL (ref 0.7–4.0)
MCH: 32.1 pg (ref 26.0–34.0)
MCHC: 34 g/dL (ref 30.0–36.0)
MCV: 94.5 fL (ref 80.0–100.0)
Monocytes Absolute: 0.9 10*3/uL (ref 0.1–1.0)
Monocytes Relative: 7 %
Neutro Abs: 11.3 10*3/uL — ABNORMAL HIGH (ref 1.7–7.7)
Neutrophils Relative %: 78 %
Platelets: 184 10*3/uL (ref 150–400)
RBC: 4.39 MIL/uL (ref 3.87–5.11)
RDW: 12.3 % (ref 11.5–15.5)
WBC: 14.3 10*3/uL — ABNORMAL HIGH (ref 4.0–10.5)
nRBC: 0 % (ref 0.0–0.2)

## 2020-08-27 LAB — COMPREHENSIVE METABOLIC PANEL
ALT: 20 U/L (ref 0–44)
AST: 29 U/L (ref 15–41)
Albumin: 3.9 g/dL (ref 3.5–5.0)
Alkaline Phosphatase: 45 U/L (ref 38–126)
Anion gap: 10 (ref 5–15)
BUN: 23 mg/dL (ref 8–23)
CO2: 28 mmol/L (ref 22–32)
Calcium: 9.4 mg/dL (ref 8.9–10.3)
Chloride: 101 mmol/L (ref 98–111)
Creatinine, Ser: 1.16 mg/dL — ABNORMAL HIGH (ref 0.44–1.00)
GFR, Estimated: 48 mL/min — ABNORMAL LOW (ref >60)
Glucose, Bld: 107 mg/dL — ABNORMAL HIGH (ref 70–99)
Potassium: 3.4 mmol/L — ABNORMAL LOW (ref 3.5–5.1)
Sodium: 139 mmol/L (ref 135–145)
Total Bilirubin: 1 mg/dL (ref 0.3–1.2)
Total Protein: 7.1 g/dL (ref 6.5–8.1)

## 2020-08-27 NOTE — Progress Notes (Signed)
[facility] OFFICE PROGRESS NOTE  Patient Care Team: [name], MD as PCP - General (Family Medicine) [name], MD as Referring Physician (Family Medicine) Bary Castilla Forest Gleason, MD (General Surgery)  Cancer Staging No matching staging information was found for the patient.   Oncology History Overview Note  1.  Carcinoma of right breast.  Status post lumpectomy and sentinel lymph node evaluation.  Tumor size 1 cm.  Estrogen and progesterone receptor positive.  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  Diagnosis in April of 2016 2.  Accelerated partial breast radiation in June of 2016 3.  Weekly Taxol and Herceptin therapy for adjuvant treatment starting from July of 2016; Finished herceptin July 2017   # 5.Patient has been taken off letrozole because of bony pain and started on tamoxifen 20 mg by mouth daily from May 12, 2015; Tamoxifen [Hx of TAH]  # BMD sep 2018- Osteopenia- 1.3;    DIAGNOSIS: BREAST CA ER/PR/her 2 NEU POSITIVE  STAGE:  I ;GOALS: cure  CURRENT/MOST RECENT THERAPY- [  Tamoxifen]    Carcinoma of overlapping sites of right breast in female, estrogen receptor positive ([place])      INTERVAL HISTORY:  Maria Gutierrez 80 y.o.  female pleasant patient above history of stage I breast cancer ER PR positive HER-2/neu positive on tamoxifen is here for follow-up.  Patient continues to complain of chronic fatigue.  Denies any nausea vomiting abdominal pain.  Chronic joint pains not any worse.  Patient denies any new lumps or bumps.   Review of Systems  Constitutional:  Positive for malaise/fatigue. Negative for chills, diaphoresis, fever and weight loss.  HENT:  Negative for nosebleeds and sore throat.   Eyes:  Negative for double vision.  Respiratory:  Negative for cough, hemoptysis, sputum production, shortness of breath and wheezing.   Cardiovascular:  Negative for chest pain, palpitations, orthopnea and leg swelling.  Gastrointestinal:  Negative for  abdominal pain, blood in stool, constipation, diarrhea, heartburn, melena, nausea and vomiting.  Genitourinary:  Negative for dysuria, frequency and urgency.  Musculoskeletal:  Positive for back pain and joint pain.  Skin: Negative.  Negative for itching and rash.  Neurological:  Negative for dizziness, tingling, focal weakness, weakness and headaches.  Endo/Heme/Allergies:  Does not bruise/bleed easily.  Psychiatric/Behavioral:  Negative for depression. The patient is not nervous/anxious and does not have insomnia.      PAST MEDICAL HISTORY :  Past Medical History:  Diagnosis Date   Anemia    B12, d2 and magnesium deficiencies   Anxiety    Arthritis    OSTEOARTHRITIS   Breast cancer of upper-outer quadrant of right female breast (Maria Gutierrez) 06/10/2014   10 mm invasive mammary carcinoma, T1b,Nx; ER 90%, 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 extensive intermediate grade DCIS.    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   DDD (degenerative disc disease), lumbar    Dyspnea    GERD (gastroesophageal reflux disease)    Heart murmur    History of blood transfusion    Hyperlipidemia    Hypertension    Personal history of chemotherapy 2016   right breast ca   Personal history of radiation therapy 2016   mammosite   SOB (shortness of breath) 12/03/2014    PAST SURGICAL HISTORY :   Past Surgical History:  Procedure Laterality Date   ABDOMINAL HYSTERECTOMY     AXILLARY LYMPH NODE BIOPSY Right 07/14/2014   Procedure: AXILLARY LYMPH NODE BIOPSY;  Surgeon: Robert Bellow, MD;  Location: [facility] ORS;  Service: General;  Laterality: Right;   BOTOX INJECTION N/A 01/13/2020   Procedure: BOTOX INJECTION;  Surgeon: Hollice Espy, MD;  Location: [facility] ORS;  Service: Urology;  Laterality: N/A;   BREAST BIOPSY Left 2012   benign   BREAST BIOPSY Right 05/26/2014   DCIS    BREAST EXCISIONAL BIOPSY Right 06/28/2017    ULCERATED SKIN WITH UNDERLYING FAT NECROSIS, ACUTE INFLAMMATION AND  REACTIVE EPITHELIAL ATYPIA   BREAST LUMPECTOMY Right 06/10/2014   DCIS and Invasive ductal carcinoma, clear margins   BREAST SURGERY Right 06/10/14   Wide excision for intermediate grade DCIS, identification of a 10  millimeter area of invasive mammary carcinoma.   CHOLECYSTECTOMY  2012   COLONOSCOPY  2009   EYE SURGERY Bilateral 2014   cataract   JOINT REPLACEMENT Left 2001   knee   JOINT REPLACEMENT Right 2003   knee   JOINT REPLACEMENT Left 2004   replacement joint broken   kneee surgery Left    PORT-A-CATH REMOVAL  02/11/2016   Dr Bary Castilla   PORTACATH PLACEMENT Left 08/27/2014   Procedure: INSERTION PORT-A-CATH;  Surgeon: Robert Bellow, MD;  Location: [facility] ORS;  Service: General;  Laterality: Left;    FAMILY HISTORY :   Family History  Problem Relation Age of Onset   Breast cancer Neg Hx     SOCIAL HISTORY:   Social History   Tobacco Use   Smoking status: Never   Smokeless tobacco: Never  Substance Use Topics   Alcohol use: No    Alcohol/week: 0.0 standard drinks   Drug use: No    ALLERGIES:  is allergic to tape.  MEDICATIONS:  Current Outpatient Medications  Medication Sig Dispense Refill   aspirin EC 81 MG tablet Take 81 mg by mouth daily.     calcium carbonate (OS-CAL) 600 MG TABS tablet Take 600 mg by mouth daily.     cyanocobalamin 1000 MCG tablet Take 1,000 mcg by mouth daily.     cyclobenzaprine (FLEXERIL) 10 MG tablet 1/2-1 po qHS prn     fexofenadine (ALLEGRA) 180 MG tablet Take 180 mg by mouth daily.     fluticasone (FLONASE) 50 MCG/ACT nasal spray Place 1 spray into both nostrils daily. 16 g 2   Glucosamine-Chondroit-Vit C-Mn (GLUCOSAMINE 1500 COMPLEX PO) Take 1 tablet by mouth daily.      hydrochlorothiazide (HYDRODIURIL) 25 MG tablet Take 25 mg by mouth daily.      HYDROcodone-acetaminophen (NORCO) 7.5-325 MG tablet Take 1 tablet by mouth 2 (two) times daily as needed for pain.  0   lansoprazole (PREVACID) 15 MG capsule Take 15 mg by mouth every  morning.      metoprolol succinate (TOPROL-XL) 50 MG 24 hr tablet Take 50 mg by mouth every morning. Take with or immediately following a meal.     Multiple Vitamin (MULTIVITAMIN WITH MINERALS) TABS tablet Take 1 tablet by mouth daily.     simvastatin (ZOCOR) 40 MG tablet Take 40 mg by mouth at bedtime.      tamoxifen (NOLVADEX) 20 MG tablet TAKE 1 TABLET BY MOUTH  DAILY 90 tablet 3   venlafaxine XR (EFFEXOR-XR) 75 MG 24 hr capsule TAKE 1 CAPSULE BY MOUTH  DAILY WITH BREAKFAST 90 capsule 3   Vitamin D, Ergocalciferol, (DRISDOL) 1.25 MG (50000 UNIT) CAPS capsule Take 1 capsule (50,000 Units total) by mouth every 7 (seven) days. Takes on Mondays 13 capsule 3   No current facility-administered medications for this visit.  Facility-Administered Medications Ordered in Other Visits  Medication Dose Route Frequency Provider Last Rate Last Admin   sodium chloride 0.9 % injection 10 mL  10 mL Intravenous PRN Choksi, Delorise Shiner, MD   10 mL at 01/27/15 1416   sodium chloride flush (NS) 0.9 % injection 10 mL  10 mL Intravenous PRN Cammie Sickle, MD   10 mL at 12/10/15 1347    PHYSICAL EXAMINATION: ECOG PERFORMANCE STATUS: 0 - Asymptomatic  BP (!) 141/83   Pulse 89   Temp 98.8 F (37.1 C) (Tympanic)   Resp 20   Wt 278 lb 4 oz (126.2 kg)   SpO2 94%   BMI 50.89 kg/m   Filed Weights   08/27/20 1024  Weight: 278 lb 4 oz (126.2 kg)   Physical Exam Constitutional:      Comments: Obese. Alone.   HENT:     Head: Normocephalic and atraumatic.     Mouth/Throat:     Pharynx: No oropharyngeal exudate.  Eyes:     Pupils: Pupils are equal, round, and reactive to light.  Cardiovascular:     Rate and Rhythm: Normal rate and regular rhythm.  Pulmonary:     Effort: No respiratory distress.     Breath sounds: Normal breath sounds. No wheezing.  Abdominal:     General: Bowel sounds are normal. There is no distension.     Palpations: Abdomen is soft. There is no mass.     Tenderness: no abdominal  tenderness There is no guarding or rebound.  Musculoskeletal:        General: No tenderness. Normal range of motion.     Cervical back: Normal range of motion and neck supple.  Skin:    General: Skin is warm.  Neurological:     Mental Status: She is alert and oriented to person, place, and time.  Psychiatric:        Mood and Affect: Affect normal.       LABORATORY DATA:  I have reviewed the data as listed    Component Value Date/Time   NA 139 08/27/2020 0952   NA 141 06/05/2014 0843   K 3.4 (L) 08/27/2020 0952   K 3.5 06/05/2014 0843   CL 101 08/27/2020 0952   CL 105 06/05/2014 0843   CO2 28 08/27/2020 0952   CO2 27 06/05/2014 0843   GLUCOSE 107 (H) 08/27/2020 0952   GLUCOSE 94 06/05/2014 0843   BUN 23 08/27/2020 0952   BUN 22 (H) 06/05/2014 0843   CREATININE 1.16 (H) 08/27/2020 0952   CREATININE 0.97 06/05/2014 0843   CALCIUM 9.4 08/27/2020 0952   CALCIUM 9.2 06/05/2014 0843   PROT 7.1 08/27/2020 0952   ALBUMIN 3.9 08/27/2020 0952   AST 29 08/27/2020 0952   ALT 20 08/27/2020 0952   ALKPHOS 45 08/27/2020 0952   BILITOT 1.0 08/27/2020 0952   GFRNONAA 48 (L) 08/27/2020 0952   GFRNONAA 58 (L) 06/05/2014 0843   GFRAA 44 (L) 08/05/2019 1315   GFRAA >60 06/05/2014 0843    No results found for: SPEP, UPEP  Lab Results  Component Value Date   WBC 14.3 (H) 08/27/2020   NEUTROABS 11.3 (H) 08/27/2020   HGB 14.1 08/27/2020   HCT 41.5 08/27/2020   MCV 94.5 08/27/2020   PLT 184 08/27/2020      Chemistry      Component Value Date/Time   NA 139 08/27/2020 0952   NA 141 06/05/2014 0843   K 3.4 (L) 08/27/2020 0952   K  3.5 06/05/2014 0843   CL 101 08/27/2020 0952   CL 105 06/05/2014 0843   CO2 28 08/27/2020 0952   CO2 27 06/05/2014 0843   BUN 23 08/27/2020 0952   BUN 22 (H) 06/05/2014 0843   CREATININE 1.16 (H) 08/27/2020 0952   CREATININE 0.97 06/05/2014 0843      Component Value Date/Time   CALCIUM 9.4 08/27/2020 0952   CALCIUM 9.2 06/05/2014 0843    ALKPHOS 45 08/27/2020 0952   AST 29 08/27/2020 0952   ALT 20 08/27/2020 0952   BILITOT 1.0 08/27/2020 0952       RADIOGRAPHIC STUDIES: I have personally reviewed the radiological images as listed and agreed with the findings in the report. No results found.   ASSESSMENT & PLAN:  Carcinoma of overlapping sites of right breast in female, estrogen receptor positive ([place]) # Breast cancer ER/PR/her 2 Neu-POSITIVE; stage I- on tamoxifen [s/p TAH].  STABLE. July 2021-mammo-WNL. STABLE; no evidnce of recurrence; discussed re: extended vs tsoppin [fatgue]- will continue for now.   # Hot flashes grade 1-STABLE Continue Effexor.   # leucytosis- sec to prednieon back   # fatigue- ? OSA [could not tolerate sleep study]talki to PPCPm  # DEC 2021-- BMD- osteopenia- -1.3 [stable from 5183];Continue calcium daily. STABLE.  Reviewed results of the bone density.  # Hypokalmeia- 3.0; ? HCTZ; defer to PCP.;  Patient on K-Dur.  # DISPOSITION:   # mammogram  In July 2022-  # follow up in Jan 4th week- MD/labs-cbc/cmpDr.B   Orders Placed This Encounter  Procedures   MM 3D SCREEN BREAST BILATERAL    Standing Status:   Future    Standing Expiration Date:   08/27/2021    Order Specific Question:   Reason for Exam (SYMPTOM  OR DIAGNOSIS REQUIRED)    Answer:   history of breast cancer    Order Specific Question:   Preferred imaging location?    Answer:   [facility]   CBC with Differential    Standing Status:   Future    Standing Expiration Date:   08/27/2021   Comprehensive metabolic panel    Standing Status:   Future    Standing Expiration Date:   08/27/2021   All questions were answered. The patient knows to call the clinic with any problems, questions or concerns.      Cammie Sickle, MD 08/31/2020 5:09 PM

## 2020-08-27 NOTE — Assessment & Plan Note (Addendum)
#   Breast cancer ER/PR/her 2 Neu-POSITIVE; stage I- on tamoxifen [s/p TAH].  STABLE. July 2021-mammo-WNL.  Stable.; no evidnce of recurrence; continue extended tamoxifen for now.  # Hot flashes grade 1-stable continue Effexor.   #Mild leukocytosis/neutrophilia-suspect secondary to prednisone.  # fatigue- ? OSA [could not tolerate sleep study] recommend talking to PCP regarding repeating the study.  # DEC 2021-- BMD- osteopenia- -1.3 [stable from 2018];Continue calcium daily.  Stable; will repeat bone density in 2023.  # Hypokalmeia- 3.2; ? HCTZ; defer to PCP.  Patient on K-Dur.  # DISPOSITION:   # mammogram  In July 2022-  # follow up in Jan 4th week- MD/labs-cbc/cmpDr.B

## 2020-09-08 ENCOUNTER — Other Ambulatory Visit: Payer: Self-pay | Admitting: General Surgery

## 2020-09-08 DIAGNOSIS — Z170 Estrogen receptor positive status [ER+]: Secondary | ICD-10-CM

## 2020-09-08 DIAGNOSIS — C50811 Malignant neoplasm of overlapping sites of right female breast: Secondary | ICD-10-CM

## 2020-10-05 ENCOUNTER — Other Ambulatory Visit: Payer: Self-pay

## 2020-10-05 ENCOUNTER — Ambulatory Visit
Admission: RE | Admit: 2020-10-05 | Discharge: 2020-10-05 | Disposition: A | Discharge status: Home / Self Care | Payer: Medicare Other | Source: Ambulatory Visit | Attending: Internal Medicine | Admitting: Internal Medicine

## 2020-10-05 DIAGNOSIS — Z170 Estrogen receptor positive status [ER+]: Secondary | ICD-10-CM | POA: Insufficient documentation

## 2020-10-05 DIAGNOSIS — C50811 Malignant neoplasm of overlapping sites of right female breast: Secondary | ICD-10-CM | POA: Diagnosis not present

## 2020-10-05 DIAGNOSIS — Z853 Personal history of malignant neoplasm of breast: Secondary | ICD-10-CM | POA: Diagnosis not present

## 2020-10-05 DIAGNOSIS — Z1231 Encounter for screening mammogram for malignant neoplasm of breast: Secondary | ICD-10-CM | POA: Diagnosis present

## 2020-12-21 ENCOUNTER — Telehealth: Payer: Self-pay | Admitting: Urology

## 2020-12-21 NOTE — Telephone Encounter (Signed)
Patient calling to inquire about Botox appointment in November with Dr. Erlene Quan.  She also inquiring about insurance authorization.   Please contact her to arrange when approved.

## 2020-12-23 ENCOUNTER — Telehealth: Payer: Self-pay | Admitting: *Deleted

## 2020-12-23 NOTE — Telephone Encounter (Signed)
Left patient a VM to setup botox. Asked her to return my call to setup lab and botox appointment.

## 2020-12-23 NOTE — Telephone Encounter (Signed)
PA for Botox approved every 6 months (4) units 12/23/20-1026/23 #[id number]

## 2020-12-28 ENCOUNTER — Ambulatory Visit: Payer: Self-pay | Admitting: Urology

## 2020-12-29 HISTORY — PX: BACK SURGERY: SHX140

## 2021-01-04 ENCOUNTER — Other Ambulatory Visit: Payer: Self-pay

## 2021-01-04 ENCOUNTER — Other Ambulatory Visit: Payer: Medicare Other

## 2021-01-04 DIAGNOSIS — N3281 Overactive bladder: Secondary | ICD-10-CM

## 2021-01-04 NOTE — Addendum Note (Signed)
Addended by: Alvera Novel on: 01/04/2021 11:33 AM   Modules accepted: Orders

## 2021-01-04 NOTE — Addendum Note (Signed)
Addended by: Alvera Novel on: 01/04/2021 11:36 AM   Modules accepted: Orders

## 2021-01-08 ENCOUNTER — Telehealth: Payer: Self-pay

## 2021-01-08 LAB — CULTURE, URINE COMPREHENSIVE

## 2021-01-08 MED ORDER — SULFAMETHOXAZOLE-TRIMETHOPRIM 800-160 MG PO TABS: 1.0000 | ORAL_TABLET | Freq: Two times a day (BID) | ORAL | 0 refills | Status: DC

## 2021-01-08 NOTE — Telephone Encounter (Signed)
Sw pt. Pt aware. Medication sent to [facility] per pt.

## 2021-01-08 NOTE — Telephone Encounter (Signed)
-----   Message from Hollice Espy, MD sent at 01/08/2021  8:35 AM EST ----- Preprocedure urine cultures growing multiple species.  I like her to take an extended course of Bactrim DS twice daily for 7 days starting today through her Botox next week.  No need to reschedule the Botox injection if she starts this weekend.  Hollice Espy, MD

## 2021-01-13 NOTE — Progress Notes (Signed)
   01/14/21  CC:  Chief Complaint  Patient presents with   Botulinum Toxin Injection     HPI: Maria Gutierrez is a 80 y.o.female with a personal history of severe OAB managed now on Botox 100 units who presents to the office today for treatment.   She underwent her initial Botox injection in the operating room on 12/2019 with significant improvement in her urinary symptoms.Her last botox injection was on 07/14/2020.  Preoperative urine culture was positive, she was treated anticipation of this procedure with Bactrim.  She relates her medication wore off about a month ago.   Vitals:   01/14/21 1041  BP: 112/82  Pulse: 92  NED. A&Ox3.   No respiratory distress   Abd soft, NT, ND Normal external genitalia with patent urethral meatus  Cystoscopy Procedure Note  Patient identification was confirmed, informed consent was obtained, and patient was prepped using Betadine solution.  Lidocaine jelly was administered per urethral meatus.    Procedure: - Flexible cystoscope introduced, without any difficulty.   - Thorough search of the bladder revealed:    normal urethral meatus    normal urothelium    no stones    no ulcers     no tumors    no urethral polyps    no trabeculation  - Ureteral orifices were normal in position and appearance.    This point time, using a flexible needle at the #4 position, we injected total of 100 units of Botox in 2 rows of 5 injections for total of 10 injections, 1 cc each.  This was well-tolerated.  There is minimal bleeding.   Post-Procedure: - Patient tolerated the procedure well   Assessment/ Plan:  1. Urge incontinence Status post uncomplicated Botox injection today   We discussed postprocedural warning symptoms   We will tentatively plan for next Botox injection in about 5 months - Urinalysis, Complete - lidocaine (XYLOCAINE) 2 % (with pres) injection 1,200 mg - botulinum toxin Type A (BOTOX) injection 100 Units -  sulfamethoxazole-trimethoprim (BACTRIM DS) 800-160 MG per tablet 1 tablet   I,Kailey Littlejohn,acting as a scribe for Hollice Espy, MD.,have documented all relevant documentation on the behalf of Hollice Espy, MD,as directed by  Hollice Espy, MD while in the presence of Hollice Espy, MD.  I have reviewed the above documentation for accuracy and completeness, and I agree with the above.   Hollice Espy, MD

## 2021-01-14 ENCOUNTER — Encounter: Payer: Self-pay | Admitting: Urology

## 2021-01-14 ENCOUNTER — Ambulatory Visit: Payer: Medicare Other | Admitting: Urology

## 2021-01-14 ENCOUNTER — Other Ambulatory Visit: Payer: Self-pay

## 2021-01-14 VITALS — BP 112/82 | HR 92 | Ht 62.0 in | Wt 278.0 lb

## 2021-01-14 DIAGNOSIS — N3941 Urge incontinence: Secondary | ICD-10-CM | POA: Diagnosis not present

## 2021-01-14 LAB — URINALYSIS, COMPLETE
Bilirubin, UA: NEGATIVE
Glucose, UA: NEGATIVE
Ketones, UA: NEGATIVE
Leukocytes,UA: NEGATIVE
Microscopic Examination: NEGATIVE
Nitrite, UA: NEGATIVE
Protein,UA: NEGATIVE
RBC, UA: NEGATIVE
Specific Gravity, UA: 1.025 (ref 1.005–1.030)
Urobilinogen, Ur: 0.2 mg/dL (ref 0.2–1.0)
pH, UA: 6 (ref 5.0–7.5)

## 2021-01-14 LAB — MICROSCOPIC EXAMINATION
Bacteria, UA: NONE SEEN
Renal Epithel, UA: NONE SEEN /hpf
WBC, UA: NONE SEEN /hpf (ref 0–5)

## 2021-01-14 MED ORDER — SULFAMETHOXAZOLE-TRIMETHOPRIM 800-160 MG PO TABS
1.0000 | ORAL_TABLET | Freq: Once | ORAL | Status: AC
Start: 2021-01-14 — End: 2021-01-14
  Administered 2021-01-14: 11:00:00 1 via ORAL

## 2021-01-14 MED ORDER — LIDOCAINE HCL 2 % IJ SOLN
60.0000 mL | Freq: Once | INTRAMUSCULAR | Status: AC
Start: 2021-01-14 — End: 2021-01-14
  Administered 2021-01-14: 1200 mg

## 2021-01-14 MED ORDER — ONABOTULINUMTOXINA 100 UNITS IJ SOLR
100.0000 [IU] | Freq: Once | INTRAMUSCULAR | Status: AC
Start: 2021-01-14 — End: 2021-01-14
  Administered 2021-01-14: 100 [IU] via INTRAMUSCULAR

## 2021-01-14 NOTE — Patient Instructions (Signed)
Call us to schedule next Botox procedure   Botulinum Toxin Bladder Injection A botulinum toxin bladder injection is a procedure to treat an overactive bladder. During the procedure, a drug called botulinum toxin is injected into the bladder through a long, thin needle. This drug relaxes the bladder muscles and reduces overactivity. You may need this procedure if your medicines are not working or you cannot take them. The procedure may be repeated as needed. The treatment is done once and it usually lasts for 6 months. Your health care provider will monitor you to see how well you respond. Tell a health care provider about: Any allergies you have. All medicines you are taking, including vitamins, herbs, eye drops, creams, and over-the-counter medicines. Any problems you or family members have had with anesthetic medicines. Any bleeding problems you have. Any surgeries you have had. Any medical conditions you have. Any previous reactions to a botulinum toxin injection. Any symptoms of urinary tract infection. These include chills, fever, a burning feeling when passing urine, and needing to pass urine often. Whether you are pregnant or may be pregnant. What are the risks? Generally this is a safe procedure. However, problems may occur, including: Not being able to pass urine. If this happens, you may need to have your bladder emptied with a thin tube (urinary catheter). Bleeding. Urinary tract infection. Allergic reaction to the botulinum toxin. Pain or burning when passing urine. Damage to nearby structures or organs. What happens before the procedure? When to stop eating and drinking Follow instructions from your health care provider about what you may eat and drink before your procedure. These may include: 8 hours before the procedure Stop eating most foods. Do not eat meat, fried foods, or fatty foods. Eat only light foods, such as toast or crackers. All liquids are okay except energy  drinks and alcohol. 6 hours before the procedure Stop eating. Drink only clear liquids, such as water, clear fruit juice, black coffee, plain tea, and sports drinks. Do not drink energy drinks or alcohol. 2 hours before the procedure Stop drinking all liquids. You may be allowed to take medicines with small sips of water. If you do not follow your health care provider's instructions, your procedure may be delayed or canceled. Medicines Ask your health care provider about: Changing or stopping your regular medicines. This is especially important if you are taking diabetes medicines or blood thinners. Taking medicines such as aspirin and ibuprofen. These medicines can thin your blood. Do not take these medicines unless your health care provider tells you to take them. Taking over-the-counter medicines, vitamins, herbs, and supplements. General instructions Ask your health care provider what steps will be taken to help prevent infection. These steps may include: Removing hair at the procedure site. Washing skin with a germ-killing soap. Taking antibiotic medicine. If you will be going home right after the procedure, plan to have a responsible adult: Take you home from the hospital or clinic. You will not be allowed to drive. Care for you for the time you are told. What happens during the procedure?  You will be asked to empty your bladder. An IV will be inserted into one of your veins. You will be given one or more of the following: A medicine to help you relax (sedative). A medicine to numb the area (local anesthetic). A medicine to make you fall asleep (general anesthetic). A long, thin scope called a cystoscope will be passed into your bladder through the part of the body that  carries urine from your bladder (urethra). The cystoscope will be used to fill your bladder with water. A long needle will be passed through the cystoscope and into the bladder. The botulinum toxin will be  injected into your bladder. It may be injected into multiple areas of your bladder. The cystoscope will be removed and your bladder will be emptied with a urinary catheter. The procedure may vary among health care providers and hospitals. What can I expect after the procedure? After your procedure, it is common to have: Blood-tinged urine. Burning or soreness when you pass urine. Follow these instructions at home: Medicines Take over-the-counter and prescription medicines only as told by your health care provider. If you were prescribed an antibiotic medicine, take it as told by your health care provider. Do not stop using the antibiotic even if you start to feel better. General instructions  If you were given a sedative during the procedure, it can affect you for several hours. Do not drive or operate machinery until your health care provider says that it is safe. Drink enough fluid to keep your urine pale yellow. Return to your normal activities as told by your health care provider. Ask your health care provider what activities are safe for you. Keep all follow-up visits. Contact a health care provider if you have: A fever or chills. Blood-tinged urine for more than one day after your procedure. Worsening pain or burning when you pass urine. Pain or burning when passing urine for more than two days after your procedure. Trouble emptying your bladder. Get help right away if you: Have bright red blood in your urine. Are unable to pass urine. Summary A botulinum toxin bladder injection is a procedure to treat an overactive bladder. This is generally a safe procedure. However, problems may occur, including not being able to pass urine, bleeding, infection, pain, and an allergic reaction to the botulinum toxin. You will be told when to stop eating and drinking, and what medicines to change or stop. Follow instructions carefully. After the procedure, it is common to have blood in your urine  and to have soreness or burning when passing urine. Contact a health care provider if you have a fever, blood in your urine for more than a few days, or trouble passing urine. Get help right away if you have bright red blood in your urine, or if you are unable to pass urine. This information is not intended to replace advice given to you by your health care provider. Make sure you discuss any questions you have with your health care provider. Document Revised: 08/21/2020 Document Reviewed: 08/21/2020 Elsevier Patient Education  [employer].

## 2021-01-14 NOTE — Progress Notes (Signed)
Bladder Instillation  Due to Botox patient is present today for a Bladder Instillation of 2% Lidocaine. Patient was cleaned and prepped in a sterile fashion with betadine and lidocaine 2% jelly was instilled into the urethra.  A 14FR catheter was inserted, urine return was noted 19ml, urine was yellow in color.  60 ml was instilled into the bladder. The catheter was then removed. Patient tolerated well, no complications were noted Patient held in bladder for 30 minutes prior to procedure starting.   Performed by: Verlene Mayer, [place]

## 2021-01-25 ENCOUNTER — Other Ambulatory Visit: Payer: Self-pay | Admitting: Family Medicine

## 2021-01-25 DIAGNOSIS — S22000A Wedge compression fracture of unspecified thoracic vertebra, initial encounter for closed fracture: Secondary | ICD-10-CM

## 2021-01-26 ENCOUNTER — Ambulatory Visit
Admission: RE | Admit: 2021-01-26 | Discharge: 2021-01-26 | Disposition: A | Discharge status: Home / Self Care | Payer: Medicare Other | Source: Ambulatory Visit | Attending: Family Medicine | Admitting: Family Medicine

## 2021-01-26 ENCOUNTER — Other Ambulatory Visit: Payer: Self-pay

## 2021-01-26 DIAGNOSIS — S22000A Wedge compression fracture of unspecified thoracic vertebra, initial encounter for closed fracture: Secondary | ICD-10-CM | POA: Diagnosis present

## 2021-01-29 ENCOUNTER — Other Ambulatory Visit: Payer: Self-pay | Admitting: Family Medicine

## 2021-01-29 DIAGNOSIS — S22000A Wedge compression fracture of unspecified thoracic vertebra, initial encounter for closed fracture: Secondary | ICD-10-CM

## 2021-02-03 ENCOUNTER — Other Ambulatory Visit: Payer: Self-pay

## 2021-02-03 ENCOUNTER — Ambulatory Visit
Admission: RE | Admit: 2021-02-03 | Discharge: 2021-02-03 | Disposition: A | Discharge status: Home / Self Care | Payer: Medicare Other | Source: Ambulatory Visit | Attending: Family Medicine | Admitting: Family Medicine

## 2021-02-03 DIAGNOSIS — S22000A Wedge compression fracture of unspecified thoracic vertebra, initial encounter for closed fracture: Secondary | ICD-10-CM

## 2021-02-03 NOTE — Consult Note (Signed)
Chief Complaint: Patient was seen in consultation today for back pain at the request of Meeler,Whitney L  Referring Physician(s): Meeler,Whitney L  History of Present Illness: Maria Gutierrez is a 80 y.o. female who was in her usual state of health in till she tripped over a hose on her patio on the 10th of November.  She had some initial mild soreness but managed fairly well until she woke up the morning of Thanksgiving (11/24) and had such significant pain that she could barely move.  MRI of the thoracic and lumbar spine performed on 01/26/2021 demonstrates an acute T8 compression fracture with approximately 25% height loss and significant marrow edema.    There is a more mild acute to subacute L1 compression fracture with minimal height loss and mild to moderate marrow edema.  Additionally, there is a chronic healed T12 compression fracture.    Maria Gutierrez reports that she has been resting and significantly decreasing her activities and taking hydrocodone to manage her pain and facilitate healing.  Unfortunately, her back pain has remained constant and seems to be getting worse rather than better.  Exacerbating factors in getting up and transitioning from the bed 2 sitting or standing.  In fact, she has been unable to sleep in her bed for the past several weeks and has to sleep upright in a recliner due to the pain.  Additionally, she is no longer able to garden or perform her normal household duties.  She is quite miserable.    She denies lower extremity paresthesia, numbness, tingling, weakness or changes in bowel or bladder function.  She is here today with her husband by her side.  He also reports to me that his wife needs relief from this pain.  She rates her pain a 7 or 8 on a scale of 10.  Her Theador Hawthorne disability questionnaire score is 18/24.    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 extensive intermediate grade DCIS.    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   DDD (degenerative disc disease), lumbar    Dyspnea    GERD (gastroesophageal reflux disease)    Heart murmur    History of blood transfusion    Hyperlipidemia    Hypertension    Personal history of chemotherapy 2016   right breast ca   Personal history of radiation therapy 2016   mammosite   SOB (shortness of breath) 12/03/2014    Past Surgical History:  Procedure Laterality Date   ABDOMINAL HYSTERECTOMY     AXILLARY LYMPH NODE BIOPSY Right 07/14/2014   Procedure: AXILLARY LYMPH NODE BIOPSY;  Surgeon: Robert Bellow, MD;  Location: [facility] ORS;  Service: General;  Laterality: Right;   BOTOX INJECTION N/A 01/13/2020   Procedure: BOTOX INJECTION;  Surgeon: Hollice Espy, MD;  Location: [facility] ORS;  Service: Urology;  Laterality: N/A;   BREAST BIOPSY Left 2012   benign   BREAST BIOPSY Right 05/26/2014   DCIS    BREAST EXCISIONAL BIOPSY Right 06/28/2017    ULCERATED SKIN WITH UNDERLYING FAT NECROSIS, ACUTE INFLAMMATION AND REACTIVE EPITHELIAL ATYPIA   BREAST LUMPECTOMY Right 06/10/2014   DCIS and Invasive ductal carcinoma, clear margins  BREAST SURGERY Right 06/10/14   Wide excision for intermediate grade DCIS, identification of a 10  millimeter area of invasive mammary carcinoma.   CHOLECYSTECTOMY  2012   COLONOSCOPY  2009   EYE SURGERY Bilateral 2014   cataract   JOINT REPLACEMENT Left 2001   knee   JOINT REPLACEMENT Right 2003   knee   JOINT REPLACEMENT Left 2004   replacement joint broken   kneee surgery Left    PORT-A-CATH REMOVAL  02/11/2016   Dr Bary Castilla   PORTACATH PLACEMENT Left 08/27/2014   Procedure: INSERTION PORT-A-CATH;  Surgeon: Robert Bellow, MD;  Location: [facility] ORS;  Service: General;   Laterality: Left;    Allergies: Tape  Medications: Prior to Admission medications   Medication Sig Start Date End Date Taking? Authorizing Provider  aspirin EC 81 MG tablet Take 81 mg by mouth daily.    [provider]  calcium carbonate (OS-CAL) 600 MG TABS tablet Take 600 mg by mouth daily.    [provider]  cyanocobalamin 1000 MCG tablet Take 1,000 mcg by mouth daily.    [provider]  cyclobenzaprine (FLEXERIL) 10 MG tablet 1/2-1 po qHS prn 01/03/17   [provider]  fexofenadine (ALLEGRA) 180 MG tablet Take 180 mg by mouth daily.    [provider]  fluticasone (FLONASE) 50 MCG/ACT nasal spray Place 1 spray into both nostrils daily. 10/14/14   Forest Gleason, MD  Glucosamine-Chondroit-Vit C-Mn (GLUCOSAMINE 1500 COMPLEX PO) Take 1 tablet by mouth daily.     [provider]  hydrochlorothiazide (HYDRODIURIL) 25 MG tablet Take 25 mg by mouth daily.  05/11/15   [provider]  HYDROcodone-acetaminophen (NORCO) 7.5-325 MG tablet Take 1 tablet by mouth 2 (two) times daily as needed for pain. 01/10/18   [provider]  lansoprazole (PREVACID) 15 MG capsule Take 15 mg by mouth every morning.     [provider]  metoprolol succinate (TOPROL-XL) 50 MG 24 hr tablet Take 50 mg by mouth every morning. Take with or immediately following a meal.    [provider]  Multiple Vitamin (MULTIVITAMIN WITH MINERALS) TABS tablet Take 1 tablet by mouth daily.    [provider]  simvastatin (ZOCOR) 40 MG tablet Take 40 mg by mouth at bedtime.  12/25/17   [provider]  tamoxifen (NOLVADEX) 20 MG tablet TAKE 1 TABLET BY MOUTH  DAILY 04/16/20   Cammie Sickle, MD  venlafaxine XR (EFFEXOR-XR) 75 MG 24 hr capsule TAKE 1 CAPSULE BY MOUTH  DAILY WITH BREAKFAST 04/16/20   Cammie Sickle, MD  Vitamin D, Ergocalciferol, (DRISDOL) 1.25 MG (50000 UNIT) CAPS capsule Take 1 capsule (50,000 Units  total) by mouth every 7 (seven) days. Takes on Mondays 05/19/20   Cammie Sickle, MD     Family History  Problem Relation Age of Onset   Breast cancer Neg Hx     Social History   Socioeconomic History   Marital status: Married    Spouse name: Trilby Drummer   Number of children: 1   Years of education: Not on file   Highest education level: Not on file  Occupational History   Occupation: house cleaner    Comment: retired  Tobacco Use   Smoking status: Never   Smokeless tobacco: Never  Substance and Sexual Activity   Alcohol use: No    Alcohol/week: 0.0 standard drinks   Drug use: No   Sexual activity: Not Currently  Other Topics Concern   Not  on file  Social History Narrative   Patient lives with husband and feels safe in her home.   Remains active, drives and is self sufficient.   Social Determinants of Health   Financial Resource Strain: Not on file  Food Insecurity: Not on file  Transportation Needs: Not on file  Physical Activity: Not on file  Stress: Not on file  Social Connections: Not on file    Review of Systems: A 12 point ROS discussed and pertinent positives are indicated in the HPI above.  All other systems are negative.  Review of Systems  Vital Signs: BP 119/81 (BP Location: Left Arm, Patient Position: Sitting, Cuff Size: Normal)   Pulse 88   Temp 98.3 F (36.8 C)   SpO2 98%   Physical Exam Constitutional:      Appearance: Normal appearance. She is obese.  HENT:     Head: Normocephalic and atraumatic.  Eyes:     General: No scleral icterus. Cardiovascular:     Rate and Rhythm: Normal rate.  Pulmonary:     Effort: Pulmonary effort is normal.     Breath sounds: Normal breath sounds.  Musculoskeletal:       Back:     Comments: TTP at the T8 level, just left of midline.  No TTP at T12 or L1.  Skin:    General: Skin is warm and dry.  Neurological:     Mental Status: She is alert and oriented to person, place, and time.  Psychiatric:         Mood and Affect: Mood normal.        Behavior: Behavior normal.     Imaging: MR THORACIC SPINE WO CONTRAST  Result Date: 01/26/2021 CLINICAL DATA:  Thoracic compression fracture.  Back pain. EXAM: MRI THORACIC SPINE WITHOUT CONTRAST TECHNIQUE: Multiplanar, multisequence MR imaging of the thoracic spine was performed. No intravenous contrast was administered. COMPARISON:  Report from 01/25/2021 thoracic spine radiographs. Lumbar spine MRI 01/01/2017. FINDINGS: Alignment: Grade 1 anterolisthesis of C3 on C4, C4 on C5, and C5 on C6. Minimal retrolisthesis of T12 on L1. Mild thoracic dextroscoliosis and partially visualized upper lumbar levoscoliosis. Vertebrae: T8 superior endplate compression fracture with 25% height loss, extensive marrow edema throughout the vertebral body extending into the pedicles, and a small fluid-filled fracture cleft along the superior endplate. No retropulsion. Edema in the T8 spinous process with a nondisplaced fracture question. Chronic T12 compression fracture with severe anterior vertebral body height loss. L1 superior endplate compression fracture with minimal height loss and mild to moderate marrow edema. Cord:  Normal cord signal and morphology. Paraspinal and other soft tissues: Mild paravertebral soft tissue edema at T8. Partially visualized large left upper pole renal cyst measuring over 10 cm in size, also present on the 2018 MRI. Subcentimeter cyst in the upper pole of the right kidney. Disc levels: Scattered mild disc bulging and shallow disc protrusions throughout the thoracic spine without spinal stenosis. Moderate multilevel thoracic facet arthrosis without compressive neural foraminal stenosis. IMPRESSION: 1. Acute T8 compression fracture with 25% height loss. 2. Questionable nondisplaced fracture of the T8 spinous process. 3. Acute to subacute L1 compression fracture with minimal height loss. 4. Chronic T12 compression fracture. 5. Mild thoracic disc  degeneration and moderate facet arthrosis without compressive stenosis. Electronically Signed   By: Logan Bores M.D.   On: 01/26/2021 20:23    Labs:  CBC: Recent Labs    02/07/20 1000 08/27/20 0952  WBC 10.4 14.3*  HGB 14.3 14.1  HCT 41.9 41.5  PLT 191 184    COAGS: No results for input(s): INR, APTT in the last 8760 hours.  BMP: Recent Labs    02/07/20 1000 08/27/20 0952  NA 139 139  K 3.0* 3.4*  CL 99 101  CO2 27 28  GLUCOSE 120* 107*  BUN 16 23  CALCIUM 8.8* 9.4  CREATININE 1.00 1.16*  GFRNONAA 58* 48*    LIVER FUNCTION TESTS: Recent Labs    02/07/20 1000 08/27/20 0952  BILITOT 1.2 1.0  AST 24 29  ALT 17 20  ALKPHOS 46 45  PROT 6.7 7.1  ALBUMIN 3.8 3.9    TUMOR MARKERS: No results for input(s): AFPTM, CEA, CA199, CHROMGRNA in the last 8760 hours.  Assessment and Plan:  Very pleasant 80 year old female who unfortunately has suffered an acute T8 compression fracture after tripping on a hose on her patio.  She is highly symptomatic and her symptoms seem to be getting worse rather than better with conservative management.  She is an excellent candidate for cement augmentation with kyphoplasty (25% height loss).    Her subacute L1 compression fracture is clinically asymptomatic.  I do not recommend kyphoplasty of this level at this time.  In the future, should she developed more significant symptoms or delayed healing at this site we could reconsider.    1.)  Schedule for T8 cement augmentation with kyphoplasty as soon as possible.  Procedure can be done at [facility], or here in the office in [place].    Thank you for this interesting consult.  I greatly enjoyed meeting Maria Gutierrez and look forward to participating in their care.  A copy of this report was sent to the requesting provider on this date.  Electronically Signed: Criselda Peaches 02/03/2021, 1:56 PM   I spent a total of  40 Minutes  in face to face in clinical consultation, greater than  50% of which was counseling/coordinating care for painful closed T8 compression fracture, initial encounter

## 2021-02-04 ENCOUNTER — Other Ambulatory Visit: Payer: Self-pay | Admitting: Family Medicine

## 2021-02-04 ENCOUNTER — Other Ambulatory Visit: Payer: Self-pay

## 2021-02-04 DIAGNOSIS — S22000A Wedge compression fracture of unspecified thoracic vertebra, initial encounter for closed fracture: Secondary | ICD-10-CM

## 2021-02-04 DIAGNOSIS — M549 Dorsalgia, unspecified: Secondary | ICD-10-CM

## 2021-02-04 DIAGNOSIS — S22060A Wedge compression fracture of T7-T8 vertebra, initial encounter for closed fracture: Secondary | ICD-10-CM

## 2021-02-06 LAB — CBC
HCT: 42.5 % (ref 35.0–45.0)
Hemoglobin: 13.9 g/dL (ref 11.7–15.5)
MCH: 31.6 pg (ref 27.0–33.0)
MCHC: 32.7 g/dL (ref 32.0–36.0)
MCV: 96.6 fL (ref 80.0–100.0)
MPV: 10.3 fL (ref 7.5–12.5)
Platelets: 193 10*3/uL (ref 140–400)
RBC: 4.4 10*6/uL (ref 3.80–5.10)
RDW: 11.6 % (ref 11.0–15.0)
WBC: 8.8 10*3/uL (ref 3.8–10.8)

## 2021-02-06 LAB — BASIC METABOLIC PANEL
BUN/Creatinine Ratio: 18 (calc) (ref 6–22)
BUN: 21 mg/dL (ref 7–25)
CO2: 31 mmol/L (ref 20–32)
Calcium: 9.4 mg/dL (ref 8.6–10.4)
Chloride: 102 mmol/L (ref 98–110)
Creat: 1.15 mg/dL — ABNORMAL HIGH (ref 0.60–1.00)
Glucose, Bld: 81 mg/dL (ref 65–139)
Potassium: 3.9 mmol/L (ref 3.5–5.3)
Sodium: 143 mmol/L (ref 135–146)

## 2021-02-12 ENCOUNTER — Other Ambulatory Visit: Payer: Self-pay | Admitting: Family Medicine

## 2021-02-12 ENCOUNTER — Ambulatory Visit
Admission: RE | Admit: 2021-02-12 | Discharge: 2021-02-12 | Disposition: A | Discharge status: Home / Self Care | Payer: Medicare Other | Source: Ambulatory Visit | Attending: Family Medicine | Admitting: Family Medicine

## 2021-02-12 ENCOUNTER — Other Ambulatory Visit: Payer: Self-pay | Admitting: Interventional Radiology

## 2021-02-12 ENCOUNTER — Other Ambulatory Visit: Payer: Self-pay

## 2021-02-12 DIAGNOSIS — M549 Dorsalgia, unspecified: Secondary | ICD-10-CM

## 2021-02-12 DIAGNOSIS — S22060A Wedge compression fracture of T7-T8 vertebra, initial encounter for closed fracture: Secondary | ICD-10-CM

## 2021-02-12 HISTORY — PX: IR KYPHO EA ADDL LEVEL THORACIC OR LUMBAR: IMG5520

## 2021-02-12 MED ORDER — FENTANYL CITRATE PF 50 MCG/ML IJ SOSY
25.0000 ug | PREFILLED_SYRINGE | INTRAMUSCULAR | Status: DC | PRN
Start: 2021-02-12 — End: 2021-02-13
  Administered 2021-02-12 (×6): 25 ug via INTRAVENOUS

## 2021-02-12 MED ORDER — KETOROLAC TROMETHAMINE 30 MG/ML IJ SOLN: 30.0000 mg | Freq: Once | INTRAMUSCULAR | Status: DC

## 2021-02-12 MED ORDER — MIDAZOLAM HCL 2 MG/2ML IJ SOLN
1.0000 mg | INTRAMUSCULAR | Status: DC | PRN
Start: 2021-02-12 — End: 2021-02-13
  Administered 2021-02-12: 0.5 mg via INTRAVENOUS
  Administered 2021-02-12: 1 mg via INTRAVENOUS
  Administered 2021-02-12 (×3): 0.5 mg via INTRAVENOUS

## 2021-02-12 MED ORDER — SODIUM CHLORIDE 0.9 % IV SOLN: INTRAVENOUS | Status: DC

## 2021-02-12 MED ORDER — CEFAZOLIN IN SODIUM CHLORIDE 3-0.9 GM/100ML-% IV SOLN
3.0000 g | INTRAVENOUS | Status: AC
  Administered 2021-02-12: 3 g via INTRAVENOUS

## 2021-02-12 MED ORDER — ACETAMINOPHEN 10 MG/ML IV SOLN
1000.0000 mg | Freq: Once | INTRAVENOUS | Status: AC
  Administered 2021-02-12: 1000 mg via INTRAVENOUS

## 2021-02-12 MED ORDER — IOPAMIDOL (ISOVUE-M 200) INJECTION 41%
3.0000 mL | Freq: Once | INTRAMUSCULAR | Status: AC
  Administered 2021-02-12: 3 mL

## 2021-02-12 NOTE — Discharge Instructions (Signed)
Kyphoplasty Post Procedure Discharge Instructions  May resume a regular diet and any medications that you routinely take (including pain medications). However, if you are taking Aspirin or an anticoagulant/blood thinner you will be told when you can resume taking these by the healthcare provider. No driving day of procedure. The day of your procedure take it easy. You may use an ice pack as needed to injection sites on back.  Ice to back 30 minutes on and 30 minutes off, as needed. May remove bandaids tomorrow after taking a shower. Replace daily with a clean bandaid until healed.  Do not lift anything heavier than a milk jug for 1-2 weeks or determined by your physician.  Follow up with your physician in 2 weeks.    Please contact our office at [phone] for the following symptoms or if you have any questions:  Fever greater than 100 degrees Increased swelling, pain, or redness at injection site. Increased back and/or leg pain New numbness or change in symptoms from before the procedure.    Thank you for visiting [facility].   YOU MAY RESUME YOUR ASPIRIN ANYTIME AFTER PROCEDURE TODAY

## 2021-02-12 NOTE — Progress Notes (Signed)
Pt back in nursing recovery area. Pt still drowsy from procedure but will wake up when spoken to. Pt follows commands, talks in complete sentences and has no complaints at this time. Pt will be monitored until discharged by Radiologist.   

## 2021-02-17 ENCOUNTER — Telehealth: Payer: Self-pay

## 2021-02-17 NOTE — Telephone Encounter (Signed)
Phone call to pt to follow up from her kyphoplasty on 02/12/21. Pt reports her pain is completely gone post procedure and has no complaints at this time. Pt denies any signs of infection, redness at the site, draining or fever. Pt is scheduled for a telephone follow up with Dr. Laurence Ferrari next week. Pt advised to call back if anything were to change or any concerns arise and we will arrange this appointment to an in person appointment. Pt verbalized understanding.

## 2021-02-24 ENCOUNTER — Other Ambulatory Visit: Payer: Self-pay

## 2021-02-24 ENCOUNTER — Ambulatory Visit
Admission: RE | Admit: 2021-02-24 | Discharge: 2021-02-24 | Disposition: A | Discharge status: Home / Self Care | Payer: Medicare Other | Source: Ambulatory Visit | Attending: Interventional Radiology | Admitting: Interventional Radiology

## 2021-02-24 DIAGNOSIS — M549 Dorsalgia, unspecified: Secondary | ICD-10-CM

## 2021-02-24 NOTE — Progress Notes (Signed)
Chief Complaint: Patient was consulted remotely today (TeleHealth) for history of T8 compression fracture at the request of Maria Prater K.    Referring Physician(s): Meeler,Whitney L  History of Present Illness: Maria Gutierrez is a 80 y.o. female Who was initially seen on 02/03/2021 for a highly symptomatic T8 compression fracture.  She then underwent cement augmentation with kyphoplasty on 02/12/2021.  Today, we spoke via phone for a telemedicine visit today for her 2 week follow-up evaluation.    She was pleased to report that she is feeling significantly better.  She has not required use of narcotic pain medication since the day of her procedure.  She had excellent Christmas.  She sounds should happy and vibrant on the phone.  She is back to her normal activities.  She has no active complaints.    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 extensive intermediate grade DCIS.    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   DDD (degenerative disc disease), lumbar    Dyspnea    GERD (gastroesophageal reflux disease)    Heart murmur    History of blood transfusion    Hyperlipidemia    Hypertension    Personal history of chemotherapy 2016   right breast ca   Personal history of radiation therapy 2016   mammosite   SOB (shortness of breath) 12/03/2014    Past Surgical History:  Procedure Laterality Date   ABDOMINAL HYSTERECTOMY     AXILLARY LYMPH NODE BIOPSY Right 07/14/2014   Procedure: AXILLARY LYMPH NODE BIOPSY;  Surgeon: Robert Bellow, MD;  Location: [facility] ORS;  Service: General;  Laterality: Right;   BOTOX INJECTION N/A 01/13/2020   Procedure: BOTOX INJECTION;  Surgeon: Hollice Espy, MD;   Location: [facility] OR[facility];  Service: Urology;  Laterality: N/A;   BREAST BIOPSY Left 2012   benign   BREAST BIOPSY Right 05/26/2014   DCIS    BREAST EXCISIONAL BIOPSY Right 06/28/2017    ULCERATED SKIN WITH UNDERLYING FAT NECROSIS, ACUTE INFLAMMATION AND REACTIVE EPITHELIAL ATYPIA   BREAST LUMPECTOMY Right 06/10/2014   DCIS and Invasive ductal carcinoma, clear margins   BREAST SURGERY Right 06/10/14   Wide excision for intermediate grade DCIS, identification of a 10  millimeter area of invasive mammary carcinoma.   CHOLECYSTECTOMY  2012   COLONOSCOPY  2009   EYE SURGERY Bilateral 2014   cataract   IR KYPHO EA ADDL LEVEL THORACIC OR LUMBAR  02/12/2021   JOINT REPLACEMENT Left 2001   knee   JOINT REPLACEMENT Right 2003   knee   JOINT REPLACEMENT Left 2004   replacement joint broken   kneee surgery Left    PORT-A-CATH REMOVAL  02/11/2016   Dr Bary Castilla   PORTACATH PLACEMENT Left 08/27/2014   Procedure: INSERTION PORT-A-CATH;  Surgeon: Robert Bellow, MD;  Location: [facility];  Service: General;  Laterality: Left;    Allergies: Patient has no known allergies.  Medications: Prior to Admission medications   Medication Sig Start Date End Date Taking? Authorizing Provider  aspirin EC 81 MG tablet Take 81 mg by mouth daily.    [provider]  calcium carbonate (OS-CAL) 600 MG TABS tablet Take 600  mg by mouth daily.    [provider]  cyanocobalamin 1000 MCG tablet Take 1,000 mcg by mouth daily.    [provider]  cyclobenzaprine (FLEXERIL) 10 MG tablet 1/2-1 po qHS prn 01/03/17   [provider]  fexofenadine (ALLEGRA) 180 MG tablet Take 180 mg by mouth daily.    [provider]  fluticasone (FLONASE) 50 MCG/ACT nasal spray Place 1 spray into both nostrils daily. 10/14/14   Forest Gleason, MD  Glucosamine-Chondroit-Vit C-Mn (GLUCOSAMINE 1500 COMPLEX PO) Take 1 tablet by mouth daily.     [provider]  hydrochlorothiazide (HYDRODIURIL) 25  MG tablet Take 25 mg by mouth daily.  05/11/15   [provider]  HYDROcodone-acetaminophen (NORCO) 7.5-325 MG tablet Take 1 tablet by mouth 2 (two) times daily as needed for pain. 01/10/18   [provider]  lansoprazole (PREVACID) 15 MG capsule Take 15 mg by mouth every morning.     [provider]  metoprolol succinate (TOPROL-XL) 50 MG 24 hr tablet Take 50 mg by mouth every morning. Take with or immediately following a meal.    [provider]  Multiple Vitamin (MULTIVITAMIN WITH MINERALS) TABS tablet Take 1 tablet by mouth daily.    [provider]  simvastatin (ZOCOR) 40 MG tablet Take 40 mg by mouth at bedtime.  12/25/17   [provider]  tamoxifen (NOLVADEX) 20 MG tablet TAKE 1 TABLET BY MOUTH  DAILY 04/16/20   Cammie Sickle, MD  venlafaxine XR (EFFEXOR-XR) 75 MG 24 hr capsule TAKE 1 CAPSULE BY MOUTH  DAILY WITH BREAKFAST 04/16/20   Cammie Sickle, MD  Vitamin D, Ergocalciferol, (DRISDOL) 1.25 MG (50000 UNIT) CAPS capsule Take 1 capsule (50,000 Units total) by mouth every 7 (seven) days. Takes on Mondays 05/19/20   Cammie Sickle, MD     Family History  Problem Relation Age of Onset   Breast cancer Neg Hx     Social History   Socioeconomic History   Marital status: Married    Spouse name: Maria Gutierrez   Number of children: 1   Years of education: Not on file   Highest education level: Not on file  Occupational History   Occupation: house cleaner    Comment: retired  Tobacco Use   Smoking status: Never   Smokeless tobacco: Never  Substance and Sexual Activity   Alcohol use: No    Alcohol/week: 0.0 standard drinks   Drug use: No   Sexual activity: Not Currently  Other Topics Concern   Not on file  Social History Narrative   Patient lives with husband and feels safe in her home.   Remains active, drives and is self sufficient.   Social Determinants of Health   Financial Resource Strain: Not on file   Food Insecurity: Not on file  Transportation Needs: Not on file  Physical Activity: Not on file  Stress: Not on file  Social Connections: Not on file   Review of Systems  Review of Systems: A 12 point ROS discussed and pertinent positives are indicated in the HPI above.  All other systems are negative.  Physical Exam No direct physical exam was performed (except for noted visual exam findings with Video Visits).    Vital Signs: There were no vitals taken for this visit.  Imaging: MR THORACIC SPINE WO CONTRAST  Result Date: 01/26/2021 CLINICAL DATA:  Thoracic compression fracture.  Back pain. EXAM: MRI THORACIC SPINE WITHOUT CONTRAST TECHNIQUE: Multiplanar, multisequence MR imaging of the thoracic  spine was performed. No intravenous contrast was administered. COMPARISON:  Report from 01/25/2021 thoracic spine radiographs. Lumbar spine MRI 01/01/2017. FINDINGS: Alignment: Grade 1 anterolisthesis of C3 on C4, C4 on C5, and C5 on C6. Minimal retrolisthesis of T12 on L1. Mild thoracic dextroscoliosis and partially visualized upper lumbar levoscoliosis. Vertebrae: T8 superior endplate compression fracture with 25% height loss, extensive marrow edema throughout the vertebral body extending into the pedicles, and a small fluid-filled fracture cleft along the superior endplate. No retropulsion. Edema in the T8 spinous process with a nondisplaced fracture question. Chronic T12 compression fracture with severe anterior vertebral body height loss. L1 superior endplate compression fracture with minimal height loss and mild to moderate marrow edema. Cord:  Normal cord signal and morphology. Paraspinal and other soft tissues: Mild paravertebral soft tissue edema at T8. Partially visualized large left upper pole renal cyst measuring over 10 cm in size, also present on the 2018 MRI. Subcentimeter cyst in the upper pole of the right kidney. Disc levels: Scattered mild disc bulging and shallow disc protrusions  throughout the thoracic spine without spinal stenosis. Moderate multilevel thoracic facet arthrosis without compressive neural foraminal stenosis. IMPRESSION: 1. Acute T8 compression fracture with 25% height loss. 2. Questionable nondisplaced fracture of the T8 spinous process. 3. Acute to subacute L1 compression fracture with minimal height loss. 4. Chronic T12 compression fracture. 5. Mild thoracic disc degeneration and moderate facet arthrosis without compressive stenosis. Electronically Signed   By: Logan Bores M.D.   On: 01/26/2021 20:23   DG Radiologist Eval And Mgmt  Result Date: 02/03/2021 EXAM: NEW PATIENT OFFICE VISIT CHIEF COMPLAINT: Back pain HISTORY OF PRESENT ILLNESS: See note in EPIC. REVIEW OF SYSTEMS: See note in EPIC. PHYSICAL EXAMINATION: See note in EPIC. ASSESSMENT AND PLAN: See note in EPIC. Electronically Signed   By: Jacqulynn Cadet M.D.   On: 02/03/2021 14:05   IR KYPHO EA ADDL LEVEL THORACIC OR LUMBAR  Result Date: 02/12/2021 CLINICAL DATA:  Persistent midthoracic pain secondary to a slowly healing T8 compression fracture. Patient presents for cement augmentation with kyphoplasty. EXAM: FLUOROSCOPIC GUIDED KYPHOPLASTY OF THE T8 VERTEBRAL BODY COMPARISON:  MRI thoracic spine 01/26/2021 MEDICATIONS: As antibiotic prophylaxis, 2 g Ancef was ordered pre-procedure and administered intravenously within 1 hour of incision. ANESTHESIA/SEDATION: Moderate (conscious) sedation was employed during this procedure. A total of Versed 3 mg and Fentanyl 150 mcg was administered intravenously. Moderate Sedation Time: 25 minutes. The patient's level of consciousness and vital signs were monitored continuously by radiology nursing throughout the procedure under my direct supervision. FLUOROSCOPY TIME:  4 min, 9 seconds (27.7 mGy) COMPLICATIONS: None immediate. TECHNIQUE: The procedure, risks (including but not limited to bleeding, infection, organ damage), benefits, and alternatives were explained  to the patient. Questions regarding the procedure were encouraged and answered. The patient understands and consents to the procedure. The patient was placed prone on the fluoroscopic table. The skin overlying the upper thoracic region was then prepped and draped in the usual sterile fashion. Maximal barrier sterile technique was utilized including caps, mask, sterile gowns, sterile gloves, sterile drape, hand hygiene and skin antiseptic. Intravenous Fentanyl and Versed were administered as conscious sedation during continuous cardiorespiratory monitoring by the radiology RN. The left pedicle at T8 was then infiltrated with 1% lidocaine followed by the advancement of a Kyphon trocar needle through the left pedicle into the posterior one-third of the vertebral body. Subsequently, the osteo drill was advanced to the anterior third of the vertebral body. The osteo drill was  retracted. Through the working cannula, a Kyphon inflatable bone tamp 15 x 2.5 was advanced and positioned with the distal marker approximately 5 mm from the anterior aspect of the cortex. Appropriate positioning was confirmed on the AP projection. At this time, the balloon was expanded using contrast via a Kyphon inflation syringe device via micro tubing. Inflations were continued until there was near apposition with the superior end plate. At this time, methylmethacrylate mixture was reconstituted in the Kyphon bone mixing device system. This was then loaded into the delivery mechanism, attached to Kyphon cement delivery system. The balloons were deflated and removed followed by the instillation of methylmethacrylate mixture with excellent filling in the AP and lateral projections. No extravasation was noted in the disk spaces or posteriorly into the spinal canal. No epidural venous contamination was seen. The working cannulae and the bone filler were then retrieved and removed. Hemostasis was achieved with manual compression. The patient  tolerated the procedure well without immediate postprocedural complication. IMPRESSION: 1. Technically successful T8 vertebral body augmentation using balloon kyphoplasty. 2. Per CMS PQRS reporting requirements (PQRS Measure 24): Given the patient's age of greater than 60 and the fracture site (hip, distal radius, or spine), the patient should be tested for osteoporosis using DXA, and the appropriate treatment considered based on the DXA results. Electronically Signed   By: Jacqulynn Cadet M.D.   On: 02/12/2021 12:33    Labs:  CBC: Recent Labs    08/27/20 0952 02/05/21 1018  WBC 14.3* 8.8  HGB 14.1 13.9  HCT 41.5 42.5  PLT 184 193    COAGS: No results for input(s): INR, APTT in the last 8760 hours.  BMP: Recent Labs    08/27/20 0952 02/05/21 1018  NA 139 143  K 3.4* 3.9  CL 101 102  CO2 28 31  GLUCOSE 107* 81  BUN 23 21  CALCIUM 9.4 9.4  CREATININE 1.16* 1.15*  GFRNONAA 48*  --     LIVER FUNCTION TESTS: Recent Labs    08/27/20 0952  BILITOT 1.0  AST 29  ALT 20  ALKPHOS 45  PROT 7.1  ALBUMIN 3.9    TUMOR MARKERS: No results for input(s): AFPTM, CEA, CA199, CHROMGRNA in the last 8760 hours.  Assessment and Plan:  Extremely pleasant 80 year old female now 2 weeks status post T8 cement augmentation with kyphoplasty.  She is doing exceptionally well and is very happy with her result.  She has not had to take a pain pill since the day of her procedure.    1.)  no further scheduled follow-up.  She knows to call us if she develops recurrent pain or a new fracture in the future.      Electronically Signed: Criselda Peaches 02/24/2021, 10:35 AM   I spent a total of  10 Minutes in remote  clinical consultation, greater than 50% of which was counseling/coordinating care for T8 compression fracture s/p kyphoplasty.    Visit type: Audio only (telephone). Audio (no video) only due to patient preference. Alternative for in-person consultation at [facility], [address] [place], [place], [place]. This visit type was conducted due to national recommendations for restrictions regarding the COVID-19 Pandemic (e.g. social distancing).  This format is felt to be most appropriate for this patient at this time.  All issues noted in this document were discussed and addressed.

## 2021-03-04 ENCOUNTER — Other Ambulatory Visit: Payer: Self-pay

## 2021-03-04 ENCOUNTER — Inpatient Hospital Stay: Payer: Medicare Other | Attending: Internal Medicine

## 2021-03-04 ENCOUNTER — Inpatient Hospital Stay (HOSPITAL_BASED_OUTPATIENT_CLINIC_OR_DEPARTMENT_OTHER): Payer: Medicare Other | Admitting: Internal Medicine

## 2021-03-04 ENCOUNTER — Encounter: Payer: Self-pay | Admitting: Internal Medicine

## 2021-03-04 ENCOUNTER — Inpatient Hospital Stay: Payer: Medicare Other

## 2021-03-04 DIAGNOSIS — Z170 Estrogen receptor positive status [ER+]: Secondary | ICD-10-CM | POA: Insufficient documentation

## 2021-03-04 DIAGNOSIS — C50811 Malignant neoplasm of overlapping sites of right female breast: Secondary | ICD-10-CM | POA: Diagnosis not present

## 2021-03-04 DIAGNOSIS — N183 Chronic kidney disease, stage 3 unspecified: Secondary | ICD-10-CM | POA: Diagnosis not present

## 2021-03-04 DIAGNOSIS — M8580 Other specified disorders of bone density and structure, unspecified site: Secondary | ICD-10-CM | POA: Insufficient documentation

## 2021-03-04 DIAGNOSIS — Z79810 Long term (current) use of selective estrogen receptor modulators (SERMs): Secondary | ICD-10-CM | POA: Diagnosis not present

## 2021-03-04 DIAGNOSIS — Z23 Encounter for immunization: Secondary | ICD-10-CM

## 2021-03-04 DIAGNOSIS — N951 Menopausal and female climacteric states: Secondary | ICD-10-CM | POA: Diagnosis not present

## 2021-03-04 LAB — CBC WITH DIFFERENTIAL/PLATELET
Abs Immature Granulocytes: 0.01 10*3/uL (ref 0.00–0.07)
Basophils Absolute: 0.1 10*3/uL (ref 0.0–0.1)
Basophils Relative: 1 %
Eosinophils Absolute: 0.1 10*3/uL (ref 0.0–0.5)
Eosinophils Relative: 2 %
HCT: 41.4 % (ref 36.0–46.0)
Hemoglobin: 14 g/dL (ref 12.0–15.0)
Immature Granulocytes: 0 %
Lymphocytes Relative: 37 %
Lymphs Abs: 3.3 10*3/uL (ref 0.7–4.0)
MCH: 32.2 pg (ref 26.0–34.0)
MCHC: 33.8 g/dL (ref 30.0–36.0)
MCV: 95.2 fL (ref 80.0–100.0)
Monocytes Absolute: 0.7 10*3/uL (ref 0.1–1.0)
Monocytes Relative: 8 %
Neutro Abs: 4.8 10*3/uL (ref 1.7–7.7)
Neutrophils Relative %: 52 %
Platelets: 180 10*3/uL (ref 150–400)
RBC: 4.35 MIL/uL (ref 3.87–5.11)
RDW: 12.8 % (ref 11.5–15.5)
WBC: 9 10*3/uL (ref 4.0–10.5)
nRBC: 0 % (ref 0.0–0.2)

## 2021-03-04 LAB — COMPREHENSIVE METABOLIC PANEL
ALT: 29 U/L (ref 0–44)
AST: 33 U/L (ref 15–41)
Albumin: 3.6 g/dL (ref 3.5–5.0)
Alkaline Phosphatase: 61 U/L (ref 38–126)
Anion gap: 9 (ref 5–15)
BUN: 20 mg/dL (ref 8–23)
CO2: 28 mmol/L (ref 22–32)
Calcium: 8.9 mg/dL (ref 8.9–10.3)
Chloride: 103 mmol/L (ref 98–111)
Creatinine, Ser: 1.19 mg/dL — ABNORMAL HIGH (ref 0.44–1.00)
GFR, Estimated: 47 mL/min — ABNORMAL LOW (ref >60)
Glucose, Bld: 113 mg/dL — ABNORMAL HIGH (ref 70–99)
Potassium: 3.2 mmol/L — ABNORMAL LOW (ref 3.5–5.1)
Sodium: 140 mmol/L (ref 135–145)
Total Bilirubin: 0.9 mg/dL (ref 0.3–1.2)
Total Protein: 6.7 g/dL (ref 6.5–8.1)

## 2021-03-04 MED ORDER — INFLUENZA VAC A&B SA ADJ QUAD 0.5 ML IM PRSY
0.5000 mL | PREFILLED_SYRINGE | Freq: Once | INTRAMUSCULAR | Status: AC
  Administered 2021-03-04: 0.5 mL via INTRAMUSCULAR
  Filled 2021-03-04: qty 0.5

## 2021-03-04 NOTE — Progress Notes (Signed)
[facility] OFFICE PROGRESS NOTE  Patient Care Team: [name], MD as PCP - General (Family Medicine) [name], MD as Referring Physician (Family Medicine) Bary Castilla Forest Gleason, MD (General Surgery)   Cancer Staging  No matching staging information was found for the patient.   Oncology History Overview Note  1.  Carcinoma of right breast.  Status post lumpectomy and sentinel lymph node evaluation.  Tumor size 1 cm.  Estrogen and progesterone receptor positive.  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  Diagnosis in April of 2016 2.  Accelerated partial breast radiation in June of 2016 3.  Weekly Taxol and Herceptin therapy for adjuvant treatment starting from July of 2016; Finished herceptin July 2017   # 5.Patient has been taken off letrozole because of bony pain and started on tamoxifen 20 mg by mouth daily from May 12, 2015; Tamoxifen [Hx of TAH]  # BMD sep 2018- Osteopenia- 1.3;    DIAGNOSIS: BREAST CA ER/PR/her 2 NEU POSITIVE  STAGE:  I ;GOALS: cure  CURRENT/MOST RECENT THERAPY- [  Tamoxifen]    Carcinoma of overlapping sites of right breast in female, estrogen receptor positive ([place])      INTERVAL HISTORY: Walks with a cane.  Ambulating independently otherwise.  Alone.  Maria Gutierrez 81 y.o.  female pleasant patient above history of stage I breast cancer ER PR positive HER-2/neu positive on tamoxifen is here for follow-up.  In the interim patient a mechanical fall.  She hurt her back at compression fractures status post kyphoplasty.  Pain is improving not resolved.  Patient continues to complain of chronic fatigue.  Denies any nausea vomiting abdominal pain.  Patient denies any new lumps or bumps.   Review of Systems  Constitutional:  Positive for malaise/fatigue. Negative for chills, diaphoresis, fever and weight loss.  HENT:  Negative for nosebleeds and sore throat.   Eyes:  Negative for double vision.  Respiratory:  Negative for cough, hemoptysis,  sputum production, shortness of breath and wheezing.   Cardiovascular:  Negative for chest pain, palpitations, orthopnea and leg swelling.  Gastrointestinal:  Negative for abdominal pain, blood in stool, constipation, diarrhea, heartburn, melena, nausea and vomiting.  Genitourinary:  Negative for dysuria, frequency and urgency.  Musculoskeletal:  Positive for back pain and joint pain.  Skin: Negative.  Negative for itching and rash.  Neurological:  Negative for dizziness, tingling, focal weakness, weakness and headaches.  Endo/Heme/Allergies:  Does not bruise/bleed easily.  Psychiatric/Behavioral:  Negative for depression. The patient is not nervous/anxious and does not have insomnia.      PAST MEDICAL HISTORY :  Past Medical History:  Diagnosis Date   Anemia    B12, d2 and magnesium deficiencies   Anxiety    Arthritis    OSTEOARTHRITIS   Breast cancer of upper-outer quadrant of right female breast ([place]) 06/10/2014   10 mm invasive mammary carcinoma, T1b,Nx; ER 90%, 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 extensive intermediate grade DCIS.    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   DDD (degenerative disc disease), lumbar    Dyspnea    GERD (gastroesophageal reflux disease)    Heart murmur    History of blood transfusion    Hyperlipidemia    Hypertension    Personal history of chemotherapy 2016   right breast ca   Personal history of radiation therapy 2016   mammosite   SOB (shortness of breath) 12/03/2014    PAST SURGICAL HISTORY :   Past Surgical History:  Procedure Laterality Date   ABDOMINAL HYSTERECTOMY     AXILLARY LYMPH NODE BIOPSY Right 07/14/2014   Procedure: AXILLARY LYMPH NODE BIOPSY;  Surgeon: Robert Bellow, MD;  Location: [facility] ORS;  Service: General;  Laterality: Right;   BACK SURGERY  12/29/2020   BOTOX INJECTION N/A 01/13/2020   Procedure: BOTOX INJECTION;  Surgeon: Hollice Espy, MD;  Location: [facility] ORS;  Service:  Urology;  Laterality: N/A;   BREAST BIOPSY Left 2012   benign   BREAST BIOPSY Right 05/26/2014   DCIS    BREAST EXCISIONAL BIOPSY Right 06/28/2017    ULCERATED SKIN WITH UNDERLYING FAT NECROSIS, ACUTE INFLAMMATION AND REACTIVE EPITHELIAL ATYPIA   BREAST LUMPECTOMY Right 06/10/2014   DCIS and Invasive ductal carcinoma, clear margins   BREAST SURGERY Right 06/10/2014   Wide excision for intermediate grade DCIS, identification of a 10  millimeter area of invasive mammary carcinoma.   CHOLECYSTECTOMY  2012   COLONOSCOPY  2009   EYE SURGERY Bilateral 2014   cataract   IR KYPHO EA ADDL LEVEL THORACIC OR LUMBAR  02/12/2021   JOINT REPLACEMENT Left 2001   knee   JOINT REPLACEMENT Right 2003   knee   JOINT REPLACEMENT Left 2004   replacement joint broken   kneee surgery Left    PORT-A-CATH REMOVAL  02/11/2016   Dr Bary Castilla   PORTACATH PLACEMENT Left 08/27/2014   Procedure: INSERTION PORT-A-CATH;  Surgeon: Robert Bellow, MD;  Location: [facility] ORS;  Service: General;  Laterality: Left;    FAMILY HISTORY :   Family History  Problem Relation Age of Onset   Breast cancer Neg Hx     SOCIAL HISTORY:   Social History   Tobacco Use   Smoking status: Never   Smokeless tobacco: Never  Substance Use Topics   Alcohol use: No    Alcohol/week: 0.0 standard drinks   Drug use: No    ALLERGIES:  has No Known Allergies.  MEDICATIONS:  Current Outpatient Medications  Medication Sig Dispense Refill   aspirin EC 81 MG tablet Take 81 mg by mouth daily.     calcium carbonate (OS-CAL) 600 MG TABS tablet Take 600 mg by mouth daily.     cyanocobalamin 1000 MCG tablet Take 1,000 mcg by mouth daily.     cyclobenzaprine (FLEXERIL) 10 MG tablet 1/2-1 po qHS prn     fluticasone (FLONASE) 50 MCG/ACT nasal spray Place 1 spray into both nostrils daily. 16 g 2   Glucosamine-Chondroit-Vit C-Mn (GLUCOSAMINE 1500 COMPLEX PO) Take 1 tablet by mouth daily.      hydrochlorothiazide  (HYDRODIURIL) 25 MG tablet Take 25 mg by mouth daily.      HYDROcodone-acetaminophen (NORCO) 7.5-325 MG tablet Take 1 tablet by mouth 2 (two) times daily as needed for pain.  0   lansoprazole (PREVACID) 15 MG capsule Take 15 mg by mouth every morning.      metoprolol succinate (TOPROL-XL) 50 MG 24 hr tablet Take 50 mg by mouth every morning. Take with or immediately following a meal.     Multiple Vitamin (MULTIVITAMIN WITH MINERALS) TABS tablet Take 1 tablet by mouth daily.     simvastatin (ZOCOR) 40 MG tablet Take 40 mg by mouth at bedtime.      venlafaxine XR (EFFEXOR-XR) 75 MG 24 hr capsule TAKE 1 CAPSULE BY MOUTH  DAILY WITH BREAKFAST 90 capsule 3   Vitamin D, Ergocalciferol, (DRISDOL) 1.25 MG (50000 UNIT) CAPS capsule Take 1 capsule (50,000 Units total) by mouth every 7 (seven) days.  Takes on Mondays 13 capsule 3   fexofenadine (ALLEGRA) 180 MG tablet Take 180 mg by mouth daily. (Patient not taking: Reported on 03/04/2021)     tamoxifen (NOLVADEX) 20 MG tablet TAKE 1 TABLET BY MOUTH  DAILY (Patient not taking: Reported on 03/04/2021) 90 tablet 3   No current facility-administered medications for this visit.   Facility-Administered Medications Ordered in Other Visits  Medication Dose Route Frequency Provider Last Rate Last Admin   sodium chloride 0.9 % injection 10 mL  10 mL Intravenous PRN Choksi, Delorise Shiner, MD   10 mL at 01/27/15 1416   sodium chloride flush (NS) 0.9 % injection 10 mL  10 mL Intravenous PRN Cammie Sickle, MD   10 mL at 12/10/15 1347    PHYSICAL EXAMINATION: ECOG PERFORMANCE STATUS: 0 - Asymptomatic  BP (!) 157/97 (BP Location: Left Arm, Patient Position: Sitting, Cuff Size: Normal)    Pulse 93    Temp 99.1 F (37.3 C) (Tympanic)    Ht '5\' 2"'  (1.575 m)    Wt 273 lb (123.8 kg)    SpO2 97%    BMI 49.93 kg/m   Filed Weights   03/04/21 1016  Weight: 273 lb (123.8 kg)   Physical Exam Constitutional:      Comments: Obese. Alone.   HENT:     Head:  Normocephalic and atraumatic.     Mouth/Throat:     Pharynx: No oropharyngeal exudate.  Eyes:     Pupils: Pupils are equal, round, and reactive to light.  Cardiovascular:     Rate and Rhythm: Normal rate and regular rhythm.  Pulmonary:     Effort: No respiratory distress.     Breath sounds: Normal breath sounds. No wheezing.  Abdominal:     General: Bowel sounds are normal. There is no distension.     Palpations: Abdomen is soft. There is no mass.     Tenderness: There is no abdominal tenderness. There is no guarding or rebound.  Musculoskeletal:        General: No tenderness. Normal range of motion.     Cervical back: Normal range of motion and neck supple.  Skin:    General: Skin is warm.  Neurological:     Mental Status: She is alert and oriented to person, place, and time.  Psychiatric:        Mood and Affect: Affect normal.       LABORATORY DATA:  I have reviewed the data as listed    Component Value Date/Time   NA 140 03/04/2021 0948   NA 141 06/05/2014 0843   K 3.2 (L) 03/04/2021 0948   K 3.5 06/05/2014 0843   CL 103 03/04/2021 0948   CL 105 06/05/2014 0843   CO2 28 03/04/2021 0948   CO2 27 06/05/2014 0843   GLUCOSE 113 (H) 03/04/2021 0948   GLUCOSE 94 06/05/2014 0843   BUN 20 03/04/2021 0948   BUN 22 (H) 06/05/2014 0843   CREATININE 1.19 (H) 03/04/2021 0948   CREATININE 1.15 (H) 02/05/2021 1018   CALCIUM 8.9 03/04/2021 0948   CALCIUM 9.2 06/05/2014 0843   PROT 6.7 03/04/2021 0948   ALBUMIN 3.6 03/04/2021 0948   AST 33 03/04/2021 0948   ALT 29 03/04/2021 0948   ALKPHOS 61 03/04/2021 0948   BILITOT 0.9 03/04/2021 0948   GFRNONAA 47 (L) 03/04/2021 0948   GFRNONAA 58 (L) 06/05/2014 0843   GFRAA 44 (L) 08/05/2019 1315   GFRAA >60 06/05/2014 0843    No results  found for: SPEP, UPEP  Lab Results  Component Value Date   WBC 9.0 03/04/2021   NEUTROABS 4.8 03/04/2021   HGB 14.0 03/04/2021   HCT 41.4 03/04/2021   MCV 95.2 03/04/2021   PLT 180  03/04/2021      Chemistry      Component Value Date/Time   NA 140 03/04/2021 0948   NA 141 06/05/2014 0843   K 3.2 (L) 03/04/2021 0948   K 3.5 06/05/2014 0843   CL 103 03/04/2021 0948   CL 105 06/05/2014 0843   CO2 28 03/04/2021 0948   CO2 27 06/05/2014 0843   BUN 20 03/04/2021 0948   BUN 22 (H) 06/05/2014 0843   CREATININE 1.19 (H) 03/04/2021 0948   CREATININE 1.15 (H) 02/05/2021 1018      Component Value Date/Time   CALCIUM 8.9 03/04/2021 0948   CALCIUM 9.2 06/05/2014 0843   ALKPHOS 61 03/04/2021 0948   AST 33 03/04/2021 0948   ALT 29 03/04/2021 0948   BILITOT 0.9 03/04/2021 0948       RADIOGRAPHIC STUDIES: I have personally reviewed the radiological images as listed and agreed with the findings in the report. No results found.   ASSESSMENT & PLAN:  Carcinoma of overlapping sites of right breast in female, estrogen receptor positive ([place]) # Breast cancer ER/PR/her 2 Neu-POSITIVE; stage I- on tamoxifen [s/p TAH].  STABLE; AUG 2022- -mammo-WNL.  No evidnce of recurrence; continue extended tamoxifen for now [until 2027].  Repeat mammogram in August 2023.  # Hot flashes grade 1-Stable. continue Effexor.   # DEC 2021-- BMD- osteopenia- -1.3 [stable from 2018];Continue calcium plus vitamin D daily.  Stable; will repeat bone density in 2023.  # Hypokalmeia- 3.2; ? HCTZ; defer to PCP.  Recommend compliance with K-Dur- STABLE  # CKD stage III-stable.  # DISPOSITION:   # flu shot today # mammo/DEXA scan in AUG 2023 # follow up in AUG 2023- after mammo- MD/labs-cbc/cmpDr.B   Orders Placed This Encounter  Procedures   MM 3D SCREEN BREAST BILATERAL    Standing Status:   Future    Standing Expiration Date:   03/04/2022    Order Specific Question:   Reason for Exam (SYMPTOM  OR DIAGNOSIS REQUIRED)    Answer:   screening/hx breast cancer    Order Specific Question:   Preferred imaging location?    Answer:   [facility]   DG Bone Density    Standing Status:    Future    Standing Expiration Date:   03/04/2022    Order Specific Question:   Reason for Exam (SYMPTOM  OR DIAGNOSIS REQUIRED)    Answer:   aromatase inhibitor/hx breast cancer    Order Specific Question:   Preferred imaging location?    Answer:   [facility]   CBC with Differential/Platelet    Standing Status:   Future    Standing Expiration Date:   03/04/2022   Comprehensive metabolic panel    Standing Status:   Future    Standing Expiration Date:   03/04/2022   All questions were answered. The patient knows to call the clinic with any problems, questions or concerns.      Cammie Sickle, MD 03/04/2021 11:00 AM

## 2021-03-04 NOTE — Progress Notes (Signed)
Would like flu shot today. Fell 11/22, broke 3 vertebra, back surgery. Had covid 10/22.

## 2021-03-04 NOTE — Assessment & Plan Note (Addendum)
#   Breast cancer ER/PR/her 2 Neu-POSITIVE; stage I- on tamoxifen [s/p TAH].  STABLE; AUG 2022- -mammo-WNL.  No evidnce of recurrence; continue extended tamoxifen for now [until 2027].  Repeat mammogram in August 2023.  # Hot flashes grade 1-Stable. continue Effexor.   # DEC 2021-- BMD- osteopenia- -1.3 [stable from 2018];Continue calcium plus vitamin D daily.  Stable; will repeat bone density in 2023.  # Hypokalmeia- 3.2; ? HCTZ; defer to PCP.  Recommend compliance with K-Dur- STABLE  # CKD stage III-stable.  # DISPOSITION:   # flu shot today # mammo/DEXA scan in AUG 2023 # follow up in AUG 2023- after mammo- MD/labs-cbc/cmpDr.B

## 2021-04-13 ENCOUNTER — Other Ambulatory Visit: Payer: Self-pay | Admitting: Internal Medicine

## 2021-04-28 ENCOUNTER — Other Ambulatory Visit: Payer: Self-pay

## 2021-04-28 ENCOUNTER — Other Ambulatory Visit: Payer: Self-pay | Admitting: Internal Medicine

## 2021-04-28 DIAGNOSIS — C50811 Malignant neoplasm of overlapping sites of right female breast: Secondary | ICD-10-CM

## 2021-04-28 DIAGNOSIS — Z170 Estrogen receptor positive status [ER+]: Secondary | ICD-10-CM

## 2021-06-04 ENCOUNTER — Encounter: Payer: Self-pay | Admitting: Physical Therapy

## 2021-06-04 ENCOUNTER — Ambulatory Visit: Payer: Medicare Other | Attending: Family Medicine | Admitting: Physical Therapy

## 2021-06-04 DIAGNOSIS — R2681 Unsteadiness on feet: Secondary | ICD-10-CM | POA: Diagnosis present

## 2021-06-04 DIAGNOSIS — R42 Dizziness and giddiness: Secondary | ICD-10-CM | POA: Diagnosis present

## 2021-06-04 NOTE — Patient Instructions (Signed)
Access Code: [email or web address] ?URL: [email or web address] ?Date: 06/04/2021 ?Prepared by: [facility] ? ?Exercises ?- Brandt-Daroff Vestibular Exercise  - 1 x daily - 5 x weekly - 2 sets - 3-5 reps ?- Seated Gaze Stabilization with Head Rotation  - 1 x daily - 5 x weekly - 2 sets - 30 sec hold ?- Seated Gaze Stabilization with Head Nod  - 1 x daily - 5 x weekly - 2 sets - 30 sec hold ?- Pencil Pushups  - 1 x daily - 5 x weekly - 2 sets - 10 reps ?

## 2021-06-04 NOTE — Therapy (Addendum)
[facility] ?[address], [address] ?[place], [place], [address] ?Phone: [phone]   Fax:  [phone] ? ?Physical Therapy Evaluation ? ?Patient Details  ?Name: Maria Gutierrez ?MRN: [id number] ?Date of Birth: 1940/04/05 ?Referring Provider (PT): [name], MD ? ? ?Encounter Date: 06/04/2021 ? ? PT End of Session - 06/04/21 1201   ? ? Visit Number 1   ? Number of Visits 13   ? Date for PT Re-Evaluation 07/16/21   ? Authorization Type [employer] Medicare   ? PT Start Time 1109   ? PT Stop Time 1148   ? PT Time Calculation (min) 39 min   ? Activity Tolerance Patient tolerated treatment well   ? Behavior During Therapy [facility] for tasks assessed/performed   ? ?  ?  ? ?  ? ? ?Past Medical History:  ?Diagnosis Date  ? Anemia   ? B12, d2 and magnesium deficiencies  ? Anxiety   ? Arthritis   ? OSTEOARTHRITIS  ? Breast cancer of upper-outer quadrant of right female breast ([place]) 06/10/2014  ? 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 extensive intermediate grade DCIS.   ? Constipation   ? 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 DDD (degenerative disc disease), lumbar   ? Dyspnea   ? GERD (gastroesophageal reflux disease)   ? Heart murmur   ? History of blood transfusion   ? Hyperlipidemia   ? Hypertension   ? Personal history of chemotherapy 2016  ? right breast ca  ? Personal history of radiation therapy 2016  ? mammosite  ? SOB (shortness of breath) 12/03/2014  ? ? ?Past Surgical History:  ?Procedure Laterality Date  ? ABDOMINAL HYSTERECTOMY    ? AXILLARY LYMPH NODE BIOPSY Right 07/14/2014  ? Procedure: AXILLARY LYMPH NODE BIOPSY;  Surgeon: Robert Bellow, MD;  Location: [facility] ORS;  Service: General;  Laterality: Right;  ? BACK SURGERY  12/29/2020  ? BOTOX INJECTION N/A 01/13/2020  ? Procedure: BOTOX INJECTION;  Surgeon: Hollice Espy, MD;  Location: [facility] ORS;  Service: Urology;  Laterality: N/A;  ? BREAST BIOPSY Left 2012  ? benign  ? BREAST  BIOPSY Right 05/26/2014  ? DCIS   ? BREAST EXCISIONAL BIOPSY Right 06/28/2017  ?  ULCERATED SKIN WITH UNDERLYING FAT NECROSIS, ACUTE INFLAMMATION AND REACTIVE EPITHELIAL ATYPIA  ? BREAST LUMPECTOMY Right 06/10/2014  ? DCIS and Invasive ductal carcinoma, clear margins  ? BREAST SURGERY Right 06/10/2014  ? Wide excision for intermediate grade DCIS, identification of a 10  millimeter area of invasive mammary carcinoma.  ? CHOLECYSTECTOMY  2012  ? COLONOSCOPY  2009  ? EYE SURGERY Bilateral 2014  ? cataract  ? IR KYPHO EA ADDL LEVEL THORACIC OR LUMBAR  02/12/2021  ? JOINT REPLACEMENT Left 2001  ? knee  ? JOINT REPLACEMENT Right 2003  ? knee  ? JOINT REPLACEMENT Left 2004  ? replacement joint broken  ? kneee surgery Left   ? PORT-A-CATH REMOVAL  02/11/2016  ? Dr Bary Castilla  ? PORTACATH PLACEMENT Left 08/27/2014  ? Procedure: INSERTION PORT-A-CATH;  Surgeon: Robert Bellow, MD;  Location: [facility] ORS;  Service: General;  Laterality: Left;  ? ? ?There were no vitals filed for this visit. ? ? ? Subjective Assessment - 06/04/21 1110   ? ? Subjective Patient reports a hx of vertigo but has not had it in years. Had a bad fall in October resulting in head trauma and  needing back surgery. Dizziness started after Christmas. Episodes are described as "I couldn't control myself." Last all day. Worse with standing up. Better when laying down and trying to focus eyes. Denies any other head trauma, infection/illness, hearing loss, tinnitus, otalgia. Reports photo/phonophobia, having to use reading glasses despite having 20/20 vision and a "skim over my eyes."   ? Pertinent History anemia, anxiety, breast CA with R lumpectomy 2016, GERD, HLD, HTN, back surgery 2022, T-L kyphoplasty 2022, B knee replacement 2001/2003   ? Limitations Lifting;Standing;Walking;House hold activities   ? Diagnostic tests none recent   ? Patient Stated Goals improve dizziness   ? Currently in Pain? Other (Comment)   referral not pain-relate  ? ?  ?  ? ?   ? ? ? ? ? [facility] PT Assessment - 06/04/21 1117   ? ?  ? Assessment  ? Medical Diagnosis Dizziness and giddiness   ? Referring Provider (PT) [name], MD   ? Onset Date/Surgical Date 02/22/21   ? Next MD Visit not scheduled   ? Prior Therapy yes- s/p knee surgery   ?  ? Precautions  ? Precautions Fall   hx of CA- on meds  ?  ? Balance Screen  ? Has the patient fallen in the past 6 months Yes   ? How many times? 1   ? Has the patient had a decrease in activity level because of a fear of falling?  Yes   ? Is the patient reluctant to leave their home because of a fear of falling?  No   ?  ? Home Environment  ? Additional Comments lives in 2 story home with husband; uses cane and 4WW for longer distances   ?  ? Prior Function  ? Level of Independence Independent   ? Vocation Retired   ? Leisure gardening   ?  ? Cognition  ? Overall Cognitive Status Within Functional Limits for tasks assessed   ? ?  ?  ? ?  ? ? ? ? ? ? ? ? ? Vestibular Assessment - 06/04/21 1120   ? ?  ? Oculomotor Exam  ? Oculomotor Alignment Normal   ? Ocular ROM intact   ? Spontaneous Absent   ? Gaze-induced  Absent   ? Smooth Pursuits Intact   ? Saccades Slow   in vertical direction  ? Comment convergence: c/o blurriness ~5 in   ?  ? Oculomotor Exam-Fixation Suppressed   ? Left Head Impulse slightly positive   difficult to perform d/t muscle guarding  ? Right Head Impulse negative   difficult to perform d/t muscle guarding  ?  ? Vestibulo-Ocular Reflex  ? VOR 1 Head Only (x 1 viewing) c/o mild wooziness horizontal, trouble with gaze fixation and c/o more severe wooziness vertical   ? VOR Cancellation Corrective saccades   to L; c/o dizziness  ?  ? Positional Testing  ? Sidelying Test Sidelying Right;Sidelying Left   ? Horizontal Canal Testing Horizontal Canal Right;Horizontal Canal Left   ?  ? Sidelying Right  ? Sidelying Right Duration 0   ? Sidelying Right Symptoms No nystagmus   latent onset of dizziness upon sitting up  ?  ? Sidelying Left  ?  Sidelying Left Duration 0   ? Sidelying Left Symptoms No nystagmus   latent onset of dizziness upon sitting up  ?  ? Horizontal Canal Right  ? Horizontal Canal Right Duration 0   ? Horizontal Canal Right Symptoms Normal   ?  ?  Horizontal Canal Left  ? Horizontal Canal Left Duration o   ? Horizontal Canal Left Symptoms Normal   ? ?  ?  ? ?  ? ? ? ? ? ?Objective measurements completed on examination: See above findings.  ? ? ? ? ? ? ? ? ? ? ? ? ? ? PT Education - 06/04/21 1157   ? ? Education Details prognosis, POC, HEP-Access Code: [email or web address]   ? Person(s) Educated Patient   ? Methods Explanation;Demonstration;Tactile cues;Verbal cues;Handout   ? Comprehension Returned demonstration;Verbalized understanding   ? ?  ?  ? ?  ? ? ? PT Short Term Goals - 06/04/21 1203   ? ?  ? PT SHORT TERM GOAL #1  ? Title Patient to be independent with initial HEP.   ? Time 3   ? Period Weeks   ? Status New   ? Target Date 06/25/21   ? ?  ?  ? ?  ? ? ? ? PT Long Term Goals - 06/04/21 1204   ? ?  ? PT LONG TERM GOAL #1  ? Title Patient to be independent with advanced HEP.   ? Time 6   ? Period Weeks   ? Status New   ? Target Date 07/16/21   ?  ? PT LONG TERM GOAL #2  ? Title Patient to report 0/10 dizziness with standing vertical and horizontal VOR for 30 seconds.   ? Time 6   ? Period Weeks   ? Status New   ? Target Date 07/16/21   ?  ? PT LONG TERM GOAL #3  ? Title Patient will report 0/10 dizziness with bed mobility.   ? Time 6   ? Period Weeks   ? Status New   ? Target Date 07/16/21   ?  ? PT LONG TERM GOAL #4  ? Title Patient to score at least 20/24 on DGI in order to decrease risk of falls.   ? Time 6   ? Period Weeks   ? Status New   ? Target Date 07/16/21   ?  ? PT LONG TERM GOAL #5  ? Title Patient to report 60% improvement in visual blurring when reading.   ? Time 6   ? Period Weeks   ? Status New   ? Target Date 07/16/21   ? ?  ?  ? ?  ? ? ? ? ? ? ? ? ? Plan - 06/04/21 1201   ? ? Clinical Impression Statement Patient is a 81 y/o  F presenting to OPPT with c/o dizziness for the past 3 months. Of note, she experienced a fall resulting in head trauma to the L side of the head and resulting in needing thoracolumbar kyphoplasty. Dizzines

## 2021-06-09 ENCOUNTER — Ambulatory Visit: Payer: Medicare Other | Admitting: Rehabilitative and Restorative Service Providers"

## 2021-06-09 DIAGNOSIS — R42 Dizziness and giddiness: Secondary | ICD-10-CM | POA: Diagnosis not present

## 2021-06-09 DIAGNOSIS — R2681 Unsteadiness on feet: Secondary | ICD-10-CM

## 2021-06-09 NOTE — Therapy (Signed)
[place] [facility] ?[address], [address] ?[place], [place], [address] ?Phone: [phone]   Fax:  [phone] ? ?Physical Therapy Treatment ? ?Patient Details  ?Name: Maria Gutierrez ?MRN: [id number] ?Date of Birth: Sep 14, 1940 ?Referring Provider (PT): [name], MD ? ? ?Encounter Date: 06/09/2021 ? ? PT End of Session - 06/09/21 1107   ? ? Visit Number 2   ? Number of Visits 13   ? Date for PT Re-Evaluation 07/16/21   ? Authorization Type UHC Medicare   ? PT Start Time 1104   ? PT Stop Time 1145   ? PT Time Calculation (min) 41 min   ? Activity Tolerance Patient tolerated treatment well   ? Behavior During Therapy [facility] for tasks assessed/performed   ? ?  ?  ? ?  ? ? ?Past Medical History:  ?Diagnosis Date  ? Anemia   ? B12, d2 and magnesium deficiencies  ? Anxiety   ? Arthritis   ? OSTEOARTHRITIS  ? Breast cancer of upper-outer quadrant of right female breast ([place]) 06/10/2014  ? 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 extensive intermediate grade DCIS.   ? Constipation   ? 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 DDD (degenerative disc disease), lumbar   ? Dyspnea   ? GERD (gastroesophageal reflux disease)   ? Heart murmur   ? History of blood transfusion   ? Hyperlipidemia   ? Hypertension   ? Personal history of chemotherapy 2016  ? right breast ca  ? Personal history of radiation therapy 2016  ? mammosite  ? SOB (shortness of breath) 12/03/2014  ? ? ?Past Surgical History:  ?Procedure Laterality Date  ? ABDOMINAL HYSTERECTOMY    ? AXILLARY LYMPH NODE BIOPSY Right 07/14/2014  ? Procedure: AXILLARY LYMPH NODE BIOPSY;  Surgeon: Robert Bellow, MD;  Location: [facility] OR[facility];  Service: General;  Laterality: Right;  ? BACK SURGERY  12/29/2020  ? BOTOX INJECTION N/A 01/13/2020  ? Procedure: BOTOX INJECTION;  Surgeon: Hollice Espy, MD;  Location: [facility] OR[facility];  Service: Urology;  Laterality: N/A;  ? BREAST BIOPSY Left 2012  ? benign  ? BREAST  BIOPSY Right 05/26/2014  ? DCIS   ? BREAST EXCISIONAL BIOPSY Right 06/28/2017  ?  ULCERATED SKIN WITH UNDERLYING FAT NECROSIS, ACUTE INFLAMMATION AND REACTIVE EPITHELIAL ATYPIA  ? BREAST LUMPECTOMY Right 06/10/2014  ? DCIS and Invasive ductal carcinoma, clear margins  ? BREAST SURGERY Right 06/10/2014  ? Wide excision for intermediate grade DCIS, identification of a 10  millimeter area of invasive mammary carcinoma.  ? CHOLECYSTECTOMY  2012  ? COLONOSCOPY  2009  ? EYE SURGERY Bilateral 2014  ? cataract  ? IR KYPHO EA ADDL LEVEL THORACIC OR LUMBAR  02/12/2021  ? JOINT REPLACEMENT Left 2001  ? knee  ? JOINT REPLACEMENT Right 2003  ? knee  ? JOINT REPLACEMENT Left 2004  ? replacement joint broken  ? kneee surgery Left   ? PORT-A-CATH REMOVAL  02/11/2016  ? Dr Bary Castilla  ? PORTACATH PLACEMENT Left 08/27/2014  ? Procedure: INSERTION PORT-A-CATH;  Surgeon: Robert Bellow, MD;  Location: [facility] OR[facility];  Service: General;  Laterality: Left;  ? ? ?There were no vitals filed for this visit. ? ? Subjective Assessment - 06/09/21 1105   ? ? Subjective The patient reports she could not do the sit<>sidelying exercise at home because of back pain. She has not had a "spell" of vertigo in  2 weeks.   ? Pertinent History anemia, anxiety, breast CA with R lumpectomy 2016, GERD, HLD, HTN, back surgery 2022, T-L kyphoplasty 2022, B knee replacement 2001/2003   ? Patient Stated Goals improve dizziness   ? Currently in Pain? Yes   ? Pain Score --   mild  ? Pain Location Hip   ? Pain Orientation Left   ? Pain Descriptors / Indicators Aching   ? Pain Onset More than a month ago   ? Pain Frequency Intermittent   ? Aggravating Factors  sitting for too long   ? Pain Relieving Factors lying down   ? ?  ?  ? ?  ? ? ? ? ? [facility] PT Assessment - 06/09/21 1119   ? ?  ? Assessment  ? Medical Diagnosis Dizziness and giddiness   ? Referring Provider (PT) [name], MD   ? Onset Date/Surgical Date 02/22/21   ?  ? Standardized Balance Assessment  ?  Standardized Balance Assessment Dynamic Gait Index   ?  ? Dynamic Gait Index  ? Level Surface Moderate Impairment   ? Change in Gait Speed Moderate Impairment   ? Gait with Horizontal Head Turns Mild Impairment   ? Gait with Vertical Head Turns Moderate Impairment   ? Gait and Pivot Turn Mild Impairment   ? Step Over Obstacle Moderate Impairment   ? Step Around Obstacles Moderate Impairment   ? Steps Mild Impairment   ? Total Score 11   ? DGI comment: 11/24   ? ?  ?  ? ?  ? ? ? ? ? ? ? ? ? ? ? ? ? ? ? ? [place] Adult PT Treatment/Exercise - 06/09/21 1133   ? ?  ? Ambulation/Gait  ? Ambulation/Gait Yes   ? Gait Comments Walking without device x 160 feet x 2 reps working on posture and endurance. Pt c/o L hip pain with gait.   ?  ? Neuro Re-ed   ? Neuro Re-ed Details  Corner standing with eyes open and horizontal head turns and vertical head turns x 5 reps each plane.   ?  ? Exercises  ? Exercises Other Exercises   ? Other Exercises  seated bilat shoulder horiz abduction working on bilat scap retraction   ? ?  ?  ? ?  ? ? Vestibular Treatment/Exercise - 06/09/21 1109   ? ?  ? Vestibular Treatment/Exercise  ? Vestibular Treatment Provided Habituation;Gaze   ? Habituation Exercises Standing Horizontal Head Turns;Standing Vertical Head Turns   ? Gaze Exercises X1 Viewing Horizontal;X1 Viewing Vertical;Comment   ?  ? Standing Horizontal Head Turns  ? Number of Reps  5   ? Symptom Description  standing in corner for safety   ?  ? Standing Vertical Head Turns  ? Number of Reps  5   ? Symptom Description  standing in corner for safety   ?  ? X1 Viewing Horizontal  ? Foot Position seated   ? Reps 2   ? Comments 20 seconds x 2 reps, cues to maintain gaze and not stop.   ?  ? X1 Viewing Vertical  ? Foot Position seated   ? Reps 1   ? Comments 30 second duration   ?  ? Eye/Head Exercise Vertical  ? Comment Also did convergence activities with "pencil pushup" using a letter, and then brock string x 10 reps working on converging  between 3 beads.   ? ?  ?  ? ?  ? ? ? ? ? ? ? ? ?  PT Education - 06/09/21 1139   ? ? Education Details HEP updated and progressed   ? Person(s) Educated Patient   ? Methods Explanation;Demonstration;Handout   ? Comprehension Verbalized understanding;Returned demonstration;Verbal cues required   ? ?  ?  ? ?  ? ? ? PT Short Term Goals - 06/04/21 1203   ? ?  ? PT SHORT TERM GOAL #1  ? Title Patient to be independent with initial HEP.   ? Time 3   ? Period Weeks   ? Status New   ? Target Date 06/25/21   ? ?  ?  ? ?  ? ? ? ? PT Long Term Goals - 06/04/21 1204   ? ?  ? PT LONG TERM GOAL #1  ? Title Patient to be independent with advanced HEP.   ? Time 6   ? Period Weeks   ? Status New   ? Target Date 07/16/21   ?  ? PT LONG TERM GOAL #2  ? Title Patient to report 0/10 dizziness with standing vertical and horizontal VOR for 30 seconds.   ? Time 6   ? Period Weeks   ? Status New   ? Target Date 07/16/21   ?  ? PT LONG TERM GOAL #3  ? Title Patient will report 0/10 dizziness with bed mobility.   ? Time 6   ? Period Weeks   ? Status New   ? Target Date 07/16/21   ?  ? PT LONG TERM GOAL #4  ? Title Patient to score at least 20/24 on DGI in order to decrease risk of falls.   ? Time 6   ? Period Weeks   ? Status New   ? Target Date 07/16/21   ?  ? PT LONG TERM GOAL #5  ? Title Patient to report 60% improvement in visual blurring when reading.   ? Time 6   ? Period Weeks   ? Status New   ? Target Date 07/16/21   ? ?  ?  ? ?  ? ? ? ? ? ? ? ? Plan - 06/09/21 1236   ? ? Clinical Impression Statement The patient did not tolerate habituation for sit<>bilat sidelying due to back pain.  PT removed from HEP and discussed that we may work to reduce pain, improve activity tolerance to then add it back into HEP.  The patient tolerated VOR, convergence, standing habituation head turns, and gait to work on mobility well.  Plan to continue to progress to tolerance.   ? Rehab Potential Good   ? PT Frequency 2x / week   ? PT Duration 6 weeks   ?  PT Treatment/Interventions ADLs/Self Care Home Management;Canalith Repostioning;Cryotherapy;DME Instruction;Moist Heat;Gait training;Stair training;Functional mobility training;Therapeutic activities;Therapeut

## 2021-06-09 NOTE — Patient Instructions (Signed)
Access Code: [email or web address] ?URL: [email or web address] ?Date: 06/09/2021 ?Prepared by: Rudell Cobb ? ?Exercises ?- Seated Gaze Stabilization with Head Rotation  - 2 x daily - 5 x weekly - 2 sets - 30 sec hold ?- Seated Gaze Stabilization with Head Nod  - 2 x daily - 5 x weekly - 2 sets - 30 sec hold ?- Pencil Pushups  - 2 x daily - 5 x weekly - 2 sets - 10 reps ?- Seated Shoulder Horizontal Abduction - Thumbs Up  - 2 x daily - 7 x weekly - 1 sets - 10 reps ?- Standing with Head Rotation  - 2 x daily - 7 x weekly - 1 sets - 5 reps ?- Standing with Head Nod  - 2 x daily - 7 x weekly - 1 sets - 5 reps ?

## 2021-06-21 ENCOUNTER — Ambulatory Visit: Payer: Medicare Other

## 2021-06-21 DIAGNOSIS — R42 Dizziness and giddiness: Secondary | ICD-10-CM

## 2021-06-21 DIAGNOSIS — R2681 Unsteadiness on feet: Secondary | ICD-10-CM

## 2021-06-21 NOTE — Therapy (Signed)
[place] ?[facility] ?[address], [address] ?[place], [place], [address] ?Phone: [phone]   Fax:  [phone] ? ?Physical Therapy Treatment ? ?Patient Details  ?Name: Maria Gutierrez ?MRN: [id number] ?Date of Birth: Jun 27, 1940 ?Referring Provider (PT): [name], MD ? ? ?Encounter Date: 06/21/2021 ? ? PT End of Session - 06/21/21 1535   ? ? Visit Number 3   ? Number of Visits 13   ? Date for PT Re-Evaluation 07/16/21   ? Authorization Type UHC Medicare   ? PT Start Time 1530   ? PT Stop Time 1615   ? PT Time Calculation (min) 45 min   ? Activity Tolerance Patient tolerated treatment well   ? Behavior During Therapy [facility] for tasks assessed/performed   ? ?  ?  ? ?  ? ? ?Past Medical History:  ?Diagnosis Date  ? Anemia   ? B12, d2 and magnesium deficiencies  ? Anxiety   ? Arthritis   ? OSTEOARTHRITIS  ? Breast cancer of upper-outer quadrant of right female breast ([place]) 06/10/2014  ? 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 extensive intermediate grade DCIS.   ? Constipation   ? 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 DDD (degenerative disc disease), lumbar   ? Dyspnea   ? GERD (gastroesophageal reflux disease)   ? Heart murmur   ? History of blood transfusion   ? Hyperlipidemia   ? Hypertension   ? Personal history of chemotherapy 2016  ? right breast ca  ? Personal history of radiation therapy 2016  ? mammosite  ? SOB (shortness of breath) 12/03/2014  ? ? ?Past Surgical History:  ?Procedure Laterality Date  ? ABDOMINAL HYSTERECTOMY    ? AXILLARY LYMPH NODE BIOPSY Right 07/14/2014  ? Procedure: AXILLARY LYMPH NODE BIOPSY;  Surgeon: Robert Bellow, MD;  Location: [facility];  Service: General;  Laterality: Right;  ? BACK SURGERY  12/29/2020  ? BOTOX INJECTION N/A 01/13/2020  ? Procedure: BOTOX INJECTION;  Surgeon: Hollice Espy, MD;  Location: [facility];  Service: Urology;  Laterality: N/A;  ? BREAST BIOPSY Left 2012  ? benign  ? BREAST  BIOPSY Right 05/26/2014  ? DCIS   ? BREAST EXCISIONAL BIOPSY Right 06/28/2017  ?  ULCERATED SKIN WITH UNDERLYING FAT NECROSIS, ACUTE INFLAMMATION AND REACTIVE EPITHELIAL ATYPIA  ? BREAST LUMPECTOMY Right 06/10/2014  ? DCIS and Invasive ductal carcinoma, clear margins  ? BREAST SURGERY Right 06/10/2014  ? Wide excision for intermediate grade DCIS, identification of a 10  millimeter area of invasive mammary carcinoma.  ? CHOLECYSTECTOMY  2012  ? COLONOSCOPY  2009  ? EYE SURGERY Bilateral 2014  ? cataract  ? IR KYPHO EA ADDL LEVEL THORACIC OR LUMBAR  02/12/2021  ? JOINT REPLACEMENT Left 2001  ? knee  ? JOINT REPLACEMENT Right 2003  ? knee  ? JOINT REPLACEMENT Left 2004  ? replacement joint broken  ? kneee surgery Left   ? PORT-A-CATH REMOVAL  02/11/2016  ? Dr Bary Castilla  ? PORTACATH PLACEMENT Left 08/27/2014  ? Procedure: INSERTION PORT-A-CATH;  Surgeon: Robert Bellow, MD;  Location: [facility] OR[facility];  Service: General;  Laterality: Left;  ? ? ?There were no vitals filed for this visit. ? ? Subjective Assessment - 06/21/21 1611   ? ? Subjective No new issues have arisen since last session, [place] the [place] incident and no new episodes of dizziness   ? Pertinent History  anemia, anxiety, breast CA with R lumpectomy 2016, GERD, HLD, HTN, back surgery 2022, T-L kyphoplasty 2022, B knee replacement 2001/2003   ? Patient Stated Goals improve dizziness   ? Pain Onset More than a month ago   ? ?  ?  ? ?  ? ? ? ? ? ? ? ? ? ? ? ? ? ? ? ? ? ? ? ? [place] Adult PT Treatment/Exercise - 06/21/21 0001   ? ?  ? Ambulation/Gait  ? Ambulation/Gait Yes   ? Assistive device None   ? Ambulation Surface Level;Unlevel   ? Gait Comments gait ambulating stepping up on foam, up/down 4" step, over bolster and bend over to pick up cone x 3 rounds   ?  ? Neuro Re-ed   ? Neuro Re-ed Details  Corner standing with eyes open and horizontal head turns and vertical head turns x 5 reps each plane.   ?  ? Exercises  ? Exercises Other Exercises   ? Other  Exercises  seated bilat shoulder horiz abduction working on bilat scap retraction   ? ?  ?  ? ?  ? ? Vestibular Treatment/Exercise - 06/21/21 0001   ? ?  ? Standing Horizontal Head Turns  ? Number of Reps  5   ? Symptom Description  standing in corner for safety on foam   ?  ? Standing Vertical Head Turns  ? Number of Reps  5   ? Symptom Description  standing in corner for safety on foam   ?  ? X1 Viewing Horizontal  ? Foot Position seated/standing, foam   ? Reps 3   ? Comments 30 sec   ?  ? X1 Viewing Vertical  ? Foot Position seated/standing, foam   ? Reps 3   ? Comments 30 second duration   ?  ? Eye/Head Exercise Vertical  ? Comment Also did convergence activities with "pencil pushup" using a letter, 2x10   ? ?  ?  ? ?  ? ? ? ? ? ? ? ? ? PT Education - 06/21/21 1612   ? ? Education Details progressiong of HEP   ? Person(s) Educated Patient   ? Methods Explanation   ? Comprehension Verbalized understanding   ? ?  ?  ? ?  ? ? ? PT Short Term Goals - 06/04/21 1203   ? ?  ? PT SHORT TERM GOAL #1  ? Title Patient to be independent with initial HEP.   ? Time 3   ? Period Weeks   ? Status New   ? Target Date 06/25/21   ? ?  ?  ? ?  ? ? ? ? PT Long Term Goals - 06/04/21 1204   ? ?  ? PT LONG TERM GOAL #1  ? Title Patient to be independent with advanced HEP.   ? Time 6   ? Period Weeks   ? Status New   ? Target Date 07/16/21   ?  ? PT LONG TERM GOAL #2  ? Title Patient to report 0/10 dizziness with standing vertical and horizontal VOR for 30 seconds.   ? Time 6   ? Period Weeks   ? Status New   ? Target Date 07/16/21   ?  ? PT LONG TERM GOAL #3  ? Title Patient will report 0/10 dizziness with bed mobility.   ? Time 6   ? Period Weeks   ? Status New   ? Target Date 07/16/21   ?  ?  PT LONG TERM GOAL #4  ? Title Patient to score at least 20/24 on DGI in order to decrease risk of falls.   ? Time 6   ? Period Weeks   ? Status New   ? Target Date 07/16/21   ?  ? PT LONG TERM GOAL #5  ? Title Patient to report 60% improvement in  visual blurring when reading.   ? Time 6   ? Period Weeks   ? Status New   ? Target Date 07/16/21   ? ?  ?  ? ?  ? ? ? ? ? ? ? ? Plan - 06/21/21 1612   ? ? Clinical Impression Statement Gaze stabilization exercises progressed to standing and then standing on foam. Cues for increased frequency vs amplitude for VOR movements with one instance of unsteadiness/LOB and one latent event of dizziness when sitting during tx session. 3-4 instances of retinal slip noted during VOR x 1 with head rotation. Activity requiring dynamic balance and negotiation of obstacles and forward bending to pick up items from ground performed well without incident and no increase in symptoms with position change appreciated. Continued sessions to progress vestibular activities and general activity tolerance conditioning to improve mobility and balance   ? Rehab Potential Good   ? PT Frequency 2x / week   ? PT Duration 6 weeks   ? PT Treatment/Interventions ADLs/Self Care Home Management;Canalith Repostioning;Cryotherapy;DME Instruction;Moist Heat;Gait training;Stair training;Functional mobility training;Therapeutic activities;Therapeutic exercise;Balance training;Neuromuscular re-education;Manual techniques;Patient/family education;Passive range of motion;Dry needling;Energy conservation;Vestibular;Visual/perceptual remediation/compensation;Taping   ? PT Next Visit Plan Progress HEP, VOR, habituation, convergence; consider adding some postural/balance activities to improve DGI and standing dynamic activities (to patient tolerance).  Also gets L hip pain due to glut med weakness with gait.  Work on activity tolerane.   ? PT Home Exercise Plan progressed gaze stabilization to standing in corner for VOR x 1   ? Consulted and Agree with Plan of Care Patient   ? ?  ?  ? ?  ? ? ?Patient will benefit from skilled therapeutic intervention in order to improve the following deficits and impairments:  Abnormal gait, Decreased range of motion, Difficulty  walking, Dizziness, Decreased activity tolerance, Decreased balance ? ?Visit Diagnosis: ?Dizziness and giddiness ? ?Unsteadiness on feet ? ? ? ? ?Problem List ?Patient Active Problem List  ? Diagnosis

## 2021-06-22 ENCOUNTER — Encounter: Payer: Medicare Other | Admitting: Physical Therapy

## 2021-06-24 ENCOUNTER — Ambulatory Visit: Payer: Medicare Other | Admitting: Physical Therapy

## 2021-06-25 ENCOUNTER — Ambulatory Visit: Payer: Medicare Other | Admitting: Physical Therapy

## 2021-06-29 ENCOUNTER — Encounter: Payer: Self-pay | Admitting: Physical Therapy

## 2021-06-29 ENCOUNTER — Ambulatory Visit: Payer: Medicare Other | Attending: Family Medicine | Admitting: Physical Therapy

## 2021-06-29 VITALS — BP 142/80 | HR 82

## 2021-06-29 DIAGNOSIS — R2681 Unsteadiness on feet: Secondary | ICD-10-CM | POA: Diagnosis present

## 2021-06-29 DIAGNOSIS — R42 Dizziness and giddiness: Secondary | ICD-10-CM | POA: Diagnosis present

## 2021-06-29 NOTE — Therapy (Signed)
[place] ?[facility] ?[address], [address] ?[place], [place], [address] ?Phone: [phone]   Fax:  [phone] ? ?Physical Therapy Treatment ? ?Patient Details  ?Name: Maria Gutierrez ?MRN: [id number] ?Date of Birth: 05-14-1940 ?Referring Provider (PT): [name], MD ? ? ?Encounter Date: 06/29/2021 ? ? PT End of Session - 06/29/21 1138   ? ? Visit Number 4   ? Number of Visits 13   ? Date for PT Re-Evaluation 07/16/21   ? Authorization Type UHC Medicare   ? PT Start Time 1040   ? PT Stop Time 1136   ? PT Time Calculation (min) 56 min   ? Activity Tolerance Patient tolerated treatment well   ? Behavior During Therapy [facility] for tasks assessed/performed   ? ?  ?  ? ?  ? ? ?Past Medical History:  ?Diagnosis Date  ? Anemia   ? B12, d2 and magnesium deficiencies  ? Anxiety   ? Arthritis   ? OSTEOARTHRITIS  ? Breast cancer of upper-outer quadrant of right female breast ([place]) 06/10/2014  ? 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 extensive intermediate grade DCIS.   ? Constipation   ? 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 DDD (degenerative disc disease), lumbar   ? Dyspnea   ? GERD (gastroesophageal reflux disease)   ? Heart murmur   ? History of blood transfusion   ? Hyperlipidemia   ? Hypertension   ? Personal history of chemotherapy 2016  ? right breast ca  ? Personal history of radiation therapy 2016  ? mammosite  ? SOB (shortness of breath) 12/03/2014  ? ? ?Past Surgical History:  ?Procedure Laterality Date  ? ABDOMINAL HYSTERECTOMY    ? AXILLARY LYMPH NODE BIOPSY Right 07/14/2014  ? Procedure: AXILLARY LYMPH NODE BIOPSY;  Surgeon: Robert Bellow, MD;  Location: [facility] ORS;  Service: General;  Laterality: Right;  ? BACK SURGERY  12/29/2020  ? BOTOX INJECTION N/A 01/13/2020  ? Procedure: BOTOX INJECTION;  Surgeon: Hollice Espy, MD;  Location: [facility] OR[facility];  Service: Urology;  Laterality: N/A;  ? BREAST BIOPSY Left 2012  ? benign  ? BREAST  BIOPSY Right 05/26/2014  ? DCIS   ? BREAST EXCISIONAL BIOPSY Right 06/28/2017  ?  ULCERATED SKIN WITH UNDERLYING FAT NECROSIS, ACUTE INFLAMMATION AND REACTIVE EPITHELIAL ATYPIA  ? BREAST LUMPECTOMY Right 06/10/2014  ? DCIS and Invasive ductal carcinoma, clear margins  ? BREAST SURGERY Right 06/10/2014  ? Wide excision for intermediate grade DCIS, identification of a 10  millimeter area of invasive mammary carcinoma.  ? CHOLECYSTECTOMY  2012  ? COLONOSCOPY  2009  ? EYE SURGERY Bilateral 2014  ? cataract  ? IR KYPHO EA ADDL LEVEL THORACIC OR LUMBAR  02/12/2021  ? JOINT REPLACEMENT Left 2001  ? knee  ? JOINT REPLACEMENT Right 2003  ? knee  ? JOINT REPLACEMENT Left 2004  ? replacement joint broken  ? kneee surgery Left   ? PORT-A-CATH REMOVAL  02/11/2016  ? Dr Bary Castilla  ? PORTACATH PLACEMENT Left 08/27/2014  ? Procedure: INSERTION PORT-A-CATH;  Surgeon: Robert Bellow, MD;  Location: [facility] OR[facility];  Service: General;  Laterality: Left;  ? ? ?Vitals:  ? 06/29/21 1043 06/29/21 1044 06/29/21 1051  ?BP: (!) 129/104  (!) 142/80  ?Pulse: (!) 138 (!) 119 82  ?SpO2: 93%  94%  ? ? ? Subjective Assessment - 06/29/21 1033   ? ? Subjective Reports that she  did not come to PT the other week d/t waking up with an upset stomach and feeling swimmy headed. Had some bloodwork this AM and not feeling great. Denies chest pain, dizziness, nausea/vomiting, changes in SOB. Reports SOB at baseline d/t hx of COVID.   ? Pertinent History anemia, anxiety, breast CA with R lumpectomy 2016, GERD, HLD, HTN, back surgery 2022, T-L kyphoplasty 2022, B knee replacement 2001/2003   ? Diagnostic tests none recent   ? Patient Stated Goals improve dizziness   ? Currently in Pain? No/denies   ? ?  ?  ? ?  ? ? ? ? ? ? ? ? ? ? ? ? ? ? ? ? ? ? ? ? [place] Adult PT Treatment/Exercise - 06/29/21 0001   ? ?  ? Neuro Re-ed   ? Neuro Re-ed Details  R/L fwd/back stepping with CGA and 1 UE support on counter 10x each; standing head turns to targets 10x, 10x in [place]berg- c/o  "swimmy headed", standing head nods to targets 2x10x c/o "swimmy headed";   ? ?  ?  ? ?  ? ? Vestibular Treatment/Exercise - 06/29/21 0001   ? ?  ? Vestibular Treatment/Exercise  ? Gaze Exercises Comment   sitting brock string by jumping gaze between 2 beads 22 cm and 39cm; able to find "x" at each bead  ? ?  ?  ? ?  ? ? ? ? ? Balance Exercises - 06/29/21 0001   ? ?  ? Balance Exercises: Standing  ? Turning Right;Left;Limitations   ? Turning Limitations slow 1/2 turns to targets   1 standing rest breal required d/t c/o "swimmy headedness"  ? ?  ?  ? ?  ? ? ? ? ? PT Education - 06/29/21 1137   ? ? Education Details adjusted HEP; edu on POC and remaining symptoms with HEP; edu on vitals today and advised to f/u with PCP d/t tachycardia today   ? Person(s) Educated Patient   ? Methods Explanation;Tactile cues;Demonstration;Verbal cues;Handout   ? Comprehension Verbalized understanding;Returned demonstration   ? ?  ?  ? ?  ? ? ? PT Short Term Goals - 06/29/21 1141   ? ?  ? PT SHORT TERM GOAL #1  ? Title Patient to be independent with initial HEP.   ? Time 3   ? Period Weeks   ? Status Achieved   ? Target Date 06/25/21   ? ?  ?  ? ?  ? ? ? ? PT Long Term Goals - 06/29/21 1141   ? ?  ? PT LONG TERM GOAL #1  ? Title Patient to be independent with advanced HEP.   ? Time 6   ? Period Weeks   ? Status On-going   ? Target Date 07/16/21   ?  ? PT LONG TERM GOAL #2  ? Title Patient to report 0/10 dizziness with standing vertical and horizontal VOR for 30 seconds.   ? Time 6   ? Period Weeks   ? Status On-going   ? Target Date 07/16/21   ?  ? PT LONG TERM GOAL #3  ? Title Patient will report 0/10 dizziness with bed mobility.   ? Time 6   ? Period Weeks   ? Status On-going   ? Target Date 07/16/21   ?  ? PT LONG TERM GOAL #4  ? Title Patient to score at least 20/24 on DGI in order to decrease risk of falls.   ? Time 6   ?  Period Weeks   ? Status On-going   ? Target Date 07/16/21   ?  ? PT LONG TERM GOAL #5  ? Title Patient to  report 60% improvement in visual blurring when reading.   ? Time 6   ? Period Weeks   ? Status On-going   ? Target Date 07/16/21   ? ?  ?  ? ?  ? ? ? ? ? ? ? ? Plan - 06/29/21 1138   ? ? Clinical Impression Statement Patient arrived to session with report of having had bloodwork this AM and not feeling well as a result. Vitals at start of session revealed some hypertension and tachycardia. Allowed for sitting rest/water break and re-checked vitals, showing improved HR. Patient denied chest pain, dizziness, nausea/vomiting, changes in SOB. Reports SOB at baseline d/t hx of COVID. Demonstrated better tolerance for horizontal rather than vertical head movements d/t c/o ?swimmy headedness? Required more frequent rest breaks with horizontal movements. Initiated turns to targets with patient demonstrating slow pace and sensitivity to R and L turns. Fransisco Beau string also initiated with good ability to converge and far distances. Patient was educated on today?s vitals and how to check pulse at home as well as HR norms. Advised to f/u with PCP d/t tachycardia today. Patient reported understanding and without complaints upon leaving.   ? Comorbidities anemia, anxiety, breast CA with R lumpectomy 2016, GERD, HLD, HTN, back surgery 2022, T-L kyphoplasty 2022, B knee replacement 2001/2003   ? Rehab Potential Good   ? PT Frequency 2x / week   ? PT Duration 6 weeks   ? PT Treatment/Interventions ADLs/Self Care Home Management;Canalith Repostioning;Cryotherapy;DME Instruction;Moist Heat;Gait training;Stair training;Functional mobility training;Therapeutic activities;Therapeutic exercise;Balance training;Neuromuscular re-education;Manual techniques;Patient/family education;Passive range of motion;Dry needling;Energy conservation;Vestibular;Visual/perceptual remediation/compensation;Taping   ? PT Next Visit Plan Progress HEP, VOR, habituation, convergence; consider adding some postural/balance activities to improve DGI and standing  dynamic activities (to patient tolerance).  Also gets L hip pain due to glut med weakness with gait.  Work on activity tolerane.   ? PT Home Exercise Plan progressed gaze stabilization to standing in corner f

## 2021-07-01 ENCOUNTER — Encounter: Payer: Medicare Other | Admitting: Rehabilitative and Restorative Service Providers"

## 2021-07-02 ENCOUNTER — Encounter: Payer: Self-pay | Admitting: Physical Therapy

## 2021-07-02 ENCOUNTER — Ambulatory Visit: Payer: Medicare Other | Admitting: Physical Therapy

## 2021-07-02 DIAGNOSIS — R42 Dizziness and giddiness: Secondary | ICD-10-CM | POA: Diagnosis not present

## 2021-07-02 DIAGNOSIS — R2681 Unsteadiness on feet: Secondary | ICD-10-CM

## 2021-07-02 NOTE — Therapy (Signed)
[place] [facility] ?[address], [address] ?[place], [place], [address] ?Phone: [phone]   Fax:  [phone] ? ?Physical Therapy Treatment ? ?Patient Details  ?Name: Maria Gutierrez ?MRN: [id number] ?Date of Birth: Sep 01, 1940 ?Referring Provider (PT): [name], MD ? ? ?Encounter Date: 07/02/2021 ? ? PT End of Session - 07/02/21 1015   ? ? Visit Number 5   ? Number of Visits 13   ? Date for PT Re-Evaluation 07/16/21   ? Authorization Type UHC Medicare   ? PT Start Time 4193   ? PT Stop Time 1014   ? PT Time Calculation (min) 42 min   ? Activity Tolerance Patient tolerated treatment well;Patient limited by fatigue   ? Behavior During Therapy [facility] for tasks assessed/performed   ? ?  ?  ? ?  ? ? ?Past Medical History:  ?Diagnosis Date  ? Anemia   ? B12, d2 and magnesium deficiencies  ? Anxiety   ? Arthritis   ? OSTEOARTHRITIS  ? Breast cancer of upper-outer quadrant of right female breast ([place]) 06/10/2014  ? 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 extensive intermediate grade DCIS.   ? Constipation   ? 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 DDD (degenerative disc disease), lumbar   ? Dyspnea   ? GERD (gastroesophageal reflux disease)   ? Heart murmur   ? History of blood transfusion   ? Hyperlipidemia   ? Hypertension   ? Personal history of chemotherapy 2016  ? right breast ca  ? Personal history of radiation therapy 2016  ? mammosite  ? SOB (shortness of breath) 12/03/2014  ? ? ?Past Surgical History:  ?Procedure Laterality Date  ? ABDOMINAL HYSTERECTOMY    ? AXILLARY LYMPH NODE BIOPSY Right 07/14/2014  ? Procedure: AXILLARY LYMPH NODE BIOPSY;  Surgeon: Robert Bellow, MD;  Location: [facility] OR[facility];  Service: General;  Laterality: Right;  ? BACK SURGERY  12/29/2020  ? BOTOX INJECTION N/A 01/13/2020  ? Procedure: BOTOX INJECTION;  Surgeon: Hollice Espy, MD;  Location: [facility];  Service: Urology;  Laterality: N/A;  ? BREAST BIOPSY  Left 2012  ? benign  ? BREAST BIOPSY Right 05/26/2014  ? DCIS   ? BREAST EXCISIONAL BIOPSY Right 06/28/2017  ?  ULCERATED SKIN WITH UNDERLYING FAT NECROSIS, ACUTE INFLAMMATION AND REACTIVE EPITHELIAL ATYPIA  ? BREAST LUMPECTOMY Right 06/10/2014  ? DCIS and Invasive ductal carcinoma, clear margins  ? BREAST SURGERY Right 06/10/2014  ? Wide excision for intermediate grade DCIS, identification of a 10  millimeter area of invasive mammary carcinoma.  ? CHOLECYSTECTOMY  2012  ? COLONOSCOPY  2009  ? EYE SURGERY Bilateral 2014  ? cataract  ? IR KYPHO EA ADDL LEVEL THORACIC OR LUMBAR  02/12/2021  ? JOINT REPLACEMENT Left 2001  ? knee  ? JOINT REPLACEMENT Right 2003  ? knee  ? JOINT REPLACEMENT Left 2004  ? replacement joint broken  ? kneee surgery Left   ? PORT-A-CATH REMOVAL  02/11/2016  ? Dr Bary Castilla  ? PORTACATH PLACEMENT Left 08/27/2014  ? Procedure: INSERTION PORT-A-CATH;  Surgeon: Robert Bellow, MD;  Location: [facility] OR[facility];  Service: General;  Laterality: Left;  ? ? ?There were no vitals filed for this visit. ? ? Subjective Assessment - 07/02/21 0932   ? ? Subjective Has been monitoring her vitals- notes that BP is back down and HR is back to the 80s. Denies dizziness.   ?  Pertinent History anemia, anxiety, breast CA with R lumpectomy 2016, GERD, HLD, HTN, back surgery 2022, T-L kyphoplasty 2022, B knee replacement 2001/2003   ? Diagnostic tests none recent   ? Patient Stated Goals improve dizziness   ? Currently in Pain? No/denies   ? ?  ?  ? ?  ? ? ? ? ? ? ? ? ? ? ? ? ? ? ? ? ? ? ? ? [place] Adult PT Treatment/Exercise - 07/02/21 0001   ? ?  ? Neuro Re-ed   ? Neuro Re-ed Details  R/L stepping with/without head turns/nods 3x5, stepping over 1/2 foam + back 10x with 2 finger support, 1/2 turns to targets 2x8 at slow speed and c/o slight "swimmyheaded*, sidestepping 4x 8f   ? ?  ?  ? ?  ? ? ? ? ? ? Balance Exercises - 07/02/21 0001   ? ?  ? Balance Exercises: Standing  ? Standing Eyes Opened Narrow base of support  (BOS);Foam/compliant surface;1 rep;30 secs   ? Standing Eyes Closed Narrow base of support (BOS);Foam/compliant surface;30 secs   severe sway and LOB requiring mod A  ? Other Standing Exercises Comments alt toe taps on cone 2x16   with CGA  ? ?  ?  ? ?  ? ? ? ? ? ? ? PT Short Term Goals - 06/29/21 1141   ? ?  ? PT SHORT TERM GOAL #1  ? Title Patient to be independent with initial HEP.   ? Time 3   ? Period Weeks   ? Status Achieved   ? Target Date 06/25/21   ? ?  ?  ? ?  ? ? ? ? PT Long Term Goals - 06/29/21 1141   ? ?  ? PT LONG TERM GOAL #1  ? Title Patient to be independent with advanced HEP.   ? Time 6   ? Period Weeks   ? Status On-going   ? Target Date 07/16/21   ?  ? PT LONG TERM GOAL #2  ? Title Patient to report 0/10 dizziness with standing vertical and horizontal VOR for 30 seconds.   ? Time 6   ? Period Weeks   ? Status On-going   ? Target Date 07/16/21   ?  ? PT LONG TERM GOAL #3  ? Title Patient will report 0/10 dizziness with bed mobility.   ? Time 6   ? Period Weeks   ? Status On-going   ? Target Date 07/16/21   ?  ? PT LONG TERM GOAL #4  ? Title Patient to score at least 20/24 on DGI in order to decrease risk of falls.   ? Time 6   ? Period Weeks   ? Status On-going   ? Target Date 07/16/21   ?  ? PT LONG TERM GOAL #5  ? Title Patient to report 60% improvement in visual blurring when reading.   ? Time 6   ? Period Weeks   ? Status On-going   ? Target Date 07/16/21   ? ?  ?  ? ?  ? ? ? ? ? ? ? ? Plan - 07/02/21 1015   ? ? Clinical Impression Statement Patient arrived to session without new complaints. Reports that she has been monitoring her vitals since last session and they have stabilized. Continued working on stepping strategy with and without gaze stabilization activities. Patient still with more difficulty with L LE d/t hip weakness. Turning to targets brought on  c/o mild ?swimmyheadedness? at slow speed. SLS activities required CGA for safety as patient tried to perform these activities without  UE support. Significant sway and LOB was evident with EC activities. Plan to address this in future sessions. Patient required intermittent sitting/standing breaks to address fatigue between exercises. No complaints upon leaving.   ? Comorbidities anemia, anxiety, breast CA with R lumpectomy 2016, GERD, HLD, HTN, back surgery 2022, T-L kyphoplasty 2022, B knee replacement 2001/2003   ? Rehab Potential Good   ? PT Frequency 2x / week   ? PT Duration 6 weeks   ? PT Treatment/Interventions ADLs/Self Care Home Management;Canalith Repostioning;Cryotherapy;DME Instruction;Moist Heat;Gait training;Stair training;Functional mobility training;Therapeutic activities;Therapeutic exercise;Balance training;Neuromuscular re-education;Manual techniques;Patient/family education;Passive range of motion;Dry needling;Energy conservation;Vestibular;Visual/perceptual remediation/compensation;Taping   ? PT Next Visit Plan Progress HEP, VOR, habituation, convergence; consider adding some postural/balance activities to improve DGI and standing dynamic activities (to patient tolerance).  Also gets L hip pain due to glut med weakness with gait.  Work on activity tolerane.   ? PT Home Exercise Plan progressed gaze stabilization to standing in corner for VOR x 1   ? Consulted and Agree with Plan of Care Patient   ? ?  ?  ? ?  ? ? ?Patient will benefit from skilled therapeutic intervention in order to improve the following deficits and impairments:  Abnormal gait, Decreased range of motion, Difficulty walking, Dizziness, Decreased activity tolerance, Decreased balance ? ?Visit Diagnosis: ?Dizziness and giddiness ? ?Unsteadiness on feet ? ? ? ? ?Problem List ?Patient Active Problem List  ? Diagnosis Date Noted  ? Nipple lesion 06/28/2017  ? Bone pain 10/06/2016  ? Carcinoma of overlapping sites of right breast in female, estrogen receptor positive ([place]) 12/10/2015  ? SOB (shortness of breath) 12/03/2014  ? HLD (hyperlipidemia) 07/28/2014  ? BP  (high blood pressure) 07/28/2014  ? Allergic rhinitis, seasonal 07/28/2014  ? Arthritis, degenerative 12/23/2013  ? Arthritis 01/17/2012  ? ? ?Janene Harvey, PT, DPT ?07/02/21 10:16 AM ? ? ?[place] ?

## 2021-07-06 ENCOUNTER — Encounter: Payer: Self-pay | Admitting: Physical Therapy

## 2021-07-06 ENCOUNTER — Ambulatory Visit: Payer: Medicare Other | Admitting: Physical Therapy

## 2021-07-06 VITALS — BP 122/102 | HR 77

## 2021-07-06 DIAGNOSIS — R42 Dizziness and giddiness: Secondary | ICD-10-CM

## 2021-07-06 DIAGNOSIS — R2681 Unsteadiness on feet: Secondary | ICD-10-CM

## 2021-07-06 NOTE — Therapy (Signed)
[facility] ?[facility] ?[address], [address] ?[place], [place], [address] ?Phone: [phone]   Fax:  [phone] ? ?Physical Therapy Treatment ? ?Patient Details  ?Name: Maria Gutierrez ?MRN: [id number] ?Date of Birth: 05-13-1940 ?Referring Provider (PT): [name], MD ? ? ?Encounter Date: 07/06/2021 ? ? PT End of Session - 07/06/21 1123   ? ? Visit Number 6   ? Number of Visits 13   ? Date for PT Re-Evaluation 07/16/21   ? Authorization Type UHC Medicare   ? PT Start Time 5631   ? PT Stop Time 1120   ? PT Time Calculation (min) 41 min   ? Activity Tolerance Patient tolerated treatment well;Patient limited by fatigue   ? Behavior During Therapy [facility] for tasks assessed/performed   ? ?  ?  ? ?  ? ? ?Past Medical History:  ?Diagnosis Date  ? Anemia   ? B12, d2 and magnesium deficiencies  ? Anxiety   ? Arthritis   ? OSTEOARTHRITIS  ? Breast cancer of upper-outer quadrant of right female breast ([place]) 06/10/2014  ? 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 extensive intermediate grade DCIS.   ? Constipation   ? 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 DDD (degenerative disc disease), lumbar   ? Dyspnea   ? GERD (gastroesophageal reflux disease)   ? Heart murmur   ? History of blood transfusion   ? Hyperlipidemia   ? Hypertension   ? Personal history of chemotherapy 2016  ? right breast ca  ? Personal history of radiation therapy 2016  ? mammosite  ? SOB (shortness of breath) 12/03/2014  ? ? ?Past Surgical History:  ?Procedure Laterality Date  ? ABDOMINAL HYSTERECTOMY    ? AXILLARY LYMPH NODE BIOPSY Right 07/14/2014  ? Procedure: AXILLARY LYMPH NODE BIOPSY;  Surgeon: Robert Bellow, MD;  Location: [facility] ORS;  Service: General;  Laterality: Right;  ? BACK SURGERY  12/29/2020  ? BOTOX INJECTION N/A 01/13/2020  ? Procedure: BOTOX INJECTION;  Surgeon: Hollice Espy, MD;  Location: [facility] OR[facility];  Service: Urology;  Laterality: N/A;  ? BREAST BIOPSY  Left 2012  ? benign  ? BREAST BIOPSY Right 05/26/2014  ? DCIS   ? BREAST EXCISIONAL BIOPSY Right 06/28/2017  ?  ULCERATED SKIN WITH UNDERLYING FAT NECROSIS, ACUTE INFLAMMATION AND REACTIVE EPITHELIAL ATYPIA  ? BREAST LUMPECTOMY Right 06/10/2014  ? DCIS and Invasive ductal carcinoma, clear margins  ? BREAST SURGERY Right 06/10/2014  ? Wide excision for intermediate grade DCIS, identification of a 10  millimeter area of invasive mammary carcinoma.  ? CHOLECYSTECTOMY  2012  ? COLONOSCOPY  2009  ? EYE SURGERY Bilateral 2014  ? cataract  ? IR KYPHO EA ADDL LEVEL THORACIC OR LUMBAR  02/12/2021  ? JOINT REPLACEMENT Left 2001  ? knee  ? JOINT REPLACEMENT Right 2003  ? knee  ? JOINT REPLACEMENT Left 2004  ? replacement joint broken  ? kneee surgery Left   ? PORT-A-CATH REMOVAL  02/11/2016  ? Dr Bary Castilla  ? PORTACATH PLACEMENT Left 08/27/2014  ? Procedure: INSERTION PORT-A-CATH;  Surgeon: Robert Bellow, MD;  Location: [facility] OR[facility];  Service: General;  Laterality: Left;  ? ? ?Vitals:  ? 07/06/21 1040  ?BP: (!) 122/102  ?Pulse: 77  ?SpO2: 91%  ? ? ? Subjective Assessment - 07/06/21 1037   ? ? Subjective Feeling okay. Feeling tired and like balance is off a little. Has been  working on [employer].   ? Pertinent History anemia, anxiety, breast CA with R lumpectomy 2016, GERD, HLD, HTN, back surgery 2022, T-L kyphoplasty 2022, B knee replacement 2001/2003   ? Diagnostic tests none recent   ? Patient Stated Goals improve dizziness   ? Currently in Pain? Yes   ? Pain Score 2    ? Pain Location Hip   ? Pain Orientation Left   ? Pain Descriptors / Indicators Aching   ? ?  ?  ? ?  ? ? ? ? ? ? ? ? ? ? ? ? ? ? ? ? ? ? ? ? [facility] Adult PT Treatment/Exercise - 07/06/21 0001   ? ?  ? Neuro Re-ed   ? Neuro Re-ed Details  gait + vertical ball toss 4x 34f with breaks in between d/t SOB; side stepping over hurdle with B UE support on counter 2x10; D2 flexion ball to cone on step stool 10x   ? ?  ?  ? ?  ? ? ? ? ? ? Balance Exercises - 07/06/21 0001   ? ?   ? Balance Exercises: Standing  ? Standing Eyes Opened Narrow base of support (BOS);Foam/compliant surface;2 reps;30 secs   mild sway  ? Standing Eyes Closed Narrow base of support (BOS);30 secs;2 reps   intermittent LOB with self and PT correction  ? Retro Gait Upper extremity support   along counter with/without counter support; cues to widen BOS  ? Other Standing Exercises standing with feet wide on foam and EC 30x2"   ? ?  ?  ? ?  ? ? ? ? ? ? ? PT Short Term Goals - 06/29/21 1141   ? ?  ? PT SHORT TERM GOAL #1  ? Title Patient to be independent with initial HEP.   ? Time 3   ? Period Weeks   ? Status Achieved   ? Target Date 06/25/21   ? ?  ?  ? ?  ? ? ? ? PT Long Term Goals - 06/29/21 1141   ? ?  ? PT LONG TERM GOAL #1  ? Title Patient to be independent with advanced HEP.   ? Time 6   ? Period Weeks   ? Status On-going   ? Target Date 07/16/21   ?  ? PT LONG TERM GOAL #2  ? Title Patient to report 0/10 dizziness with standing vertical and horizontal VOR for 30 seconds.   ? Time 6   ? Period Weeks   ? Status On-going   ? Target Date 07/16/21   ?  ? PT LONG TERM GOAL #3  ? Title Patient will report 0/10 dizziness with bed mobility.   ? Time 6   ? Period Weeks   ? Status On-going   ? Target Date 07/16/21   ?  ? PT LONG TERM GOAL #4  ? Title Patient to score at least 20/24 on DGI in order to decrease risk of falls.   ? Time 6   ? Period Weeks   ? Status On-going   ? Target Date 07/16/21   ?  ? PT LONG TERM GOAL #5  ? Title Patient to report 60% improvement in visual blurring when reading.   ? Time 6   ? Period Weeks   ? Status On-going   ? Target Date 07/16/21   ? ?  ?  ? ?  ? ? ? ? ? ? ? ? Plan - 07/06/21 1124   ? ?  Clinical Impression Statement Patient arrived to session with report of some fatigue and feeling slightly off balance. Vitals at start of session revealed slightly high diastolic BP but WFL and appropriate for PT session. Worked on static balance activities on foam and with EC, with patient  demonstrating most difficulty with EC but improved with practice. Heavy cueing for posture required during dynamic balance activities as patient tends to demonstrate a forward flexed posture. Also cued patient to ?bend knees not back? with forward bending activities to avoid excess strain on LB. Patient demonstrated 1 episode of forward LOB during bending activities, requiring min-mod A from PT to correct. Patient is overall tolerating more challenging balance activities but still requiring intermittent rest breaks d/t being quick to fatigue. No complaints at end of session.   ? Comorbidities anemia, anxiety, breast CA with R lumpectomy 2016, GERD, HLD, HTN, back surgery 2022, T-L kyphoplasty 2022, B knee replacement 2001/2003   ? Rehab Potential Good   ? PT Frequency 2x / week   ? PT Duration 6 weeks   ? PT Treatment/Interventions ADLs/Self Care Home Management;Canalith Repostioning;Cryotherapy;DME Instruction;Moist Heat;Gait training;Stair training;Functional mobility training;Therapeutic activities;Therapeutic exercise;Balance training;Neuromuscular re-education;Manual techniques;Patient/family education;Passive range of motion;Dry needling;Energy conservation;Vestibular;Visual/perceptual remediation/compensation;Taping   ? PT Next Visit Plan Progress HEP, VOR, habituation, convergence; consider adding some postural/balance activities to improve DGI and standing dynamic activities (to patient tolerance).  Also gets L hip pain due to glut med weakness with gait.  Work on activity tolerane.   ? PT Home Exercise Plan progressed gaze stabilization to standing in corner for VOR x 1   ? Consulted and Agree with Plan of Care Patient   ? ?  ?  ? ?  ? ? ?Patient will benefit from skilled therapeutic intervention in order to improve the following deficits and impairments:  Abnormal gait, Decreased range of motion, Difficulty walking, Dizziness, Decreased activity tolerance, Decreased balance ? ?Visit Diagnosis: ?Dizziness  and giddiness ? ?Unsteadiness on feet ? ? ? ? ?Problem List ?Patient Active Problem List  ? Diagnosis Date Noted  ? Nipple lesion 06/28/2017  ? Bone pain 10/06/2016  ? Carcinoma of overlapping sites of right breas

## 2021-07-08 ENCOUNTER — Encounter: Payer: Medicare Other | Admitting: Physical Therapy

## 2021-07-13 ENCOUNTER — Ambulatory Visit: Payer: Medicare Other | Admitting: Physical Therapy

## 2021-07-16 ENCOUNTER — Encounter: Payer: Self-pay | Admitting: Physical Therapy

## 2021-07-16 ENCOUNTER — Ambulatory Visit: Payer: Medicare Other | Admitting: Physical Therapy

## 2021-07-16 DIAGNOSIS — R42 Dizziness and giddiness: Secondary | ICD-10-CM | POA: Diagnosis not present

## 2021-07-16 DIAGNOSIS — R2681 Unsteadiness on feet: Secondary | ICD-10-CM

## 2021-07-16 NOTE — Therapy (Signed)
[facility] [address] [place], [place], [address] Phone: [phone]   Fax:  [phone]  Physical Therapy Progress Note  Patient Details  Name: Maria Gutierrez MRN: [id number] Date of Birth: [birth year] Referring Provider (PT): [name], MD   Progress Note Reporting Period 06/04/21 to 07/16/21  See note below for Objective Data and Assessment of Progress/Goals.    Rationale for Evaluation and Treatment Rehabilitation   Encounter Date: 07/16/2021   PT End of Session - 07/16/21 1015     Visit Number 7    Number of Visits 13    Date for PT Re-Evaluation 08/27/21    Authorization Type UHC Medicare    PT Start Time 0936    PT Stop Time 1011    PT Time Calculation (min) 35 min    Activity Tolerance Patient tolerated treatment well;Other (comment)   dizziness   Behavior During Therapy WFL for tasks assessed/performed             Past Medical History:  Diagnosis Date   Anemia    B12, d2 and magnesium deficiencies   Anxiety    Arthritis    OSTEOARTHRITIS   Breast cancer of upper-outer quadrant of right female breast ([place]) 06/10/2014   10 mm invasive mammary carcinoma, T1b,Nx; ER 90%, 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 extensive intermediate grade DCIS.    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   DDD (degenerative disc disease), lumbar    Dyspnea    GERD (gastroesophageal reflux disease)    Heart murmur    History of blood transfusion    Hyperlipidemia    Hypertension    Personal history of chemotherapy 2016   right breast ca   Personal history of radiation therapy 2016   mammosite   SOB (shortness of breath) 12/03/2014    Past Surgical History:  Procedure Laterality Date   ABDOMINAL HYSTERECTOMY     AXILLARY LYMPH NODE BIOPSY Right 07/14/2014   Procedure: AXILLARY LYMPH NODE BIOPSY;  Surgeon: Robert Bellow, MD;  Location: [facility];  Service: General;  Laterality: Right;   BACK  SURGERY  12/29/2020   BOTOX INJECTION N/A 01/13/2020   Procedure: BOTOX INJECTION;  Surgeon: Hollice Espy, MD;  Location: [facility];  Service: Urology;  Laterality: N/A;   BREAST BIOPSY Left 2012   benign   BREAST BIOPSY Right 05/26/2014   DCIS    BREAST EXCISIONAL BIOPSY Right 06/28/2017    ULCERATED SKIN WITH UNDERLYING FAT NECROSIS, ACUTE INFLAMMATION AND REACTIVE EPITHELIAL ATYPIA   BREAST LUMPECTOMY Right 06/10/2014   DCIS and Invasive ductal carcinoma, clear margins   BREAST SURGERY Right 06/10/2014   Wide excision for intermediate grade DCIS, identification of a 10  millimeter area of invasive mammary carcinoma.   CHOLECYSTECTOMY  2012   COLONOSCOPY  2009   EYE SURGERY Bilateral 2014   cataract   IR KYPHO EA ADDL LEVEL THORACIC OR LUMBAR  02/12/2021   JOINT REPLACEMENT Left 2001   knee   JOINT REPLACEMENT Right 2003   knee   JOINT REPLACEMENT Left 2004   replacement joint broken   kneee surgery Left    PORT-A-CATH REMOVAL  02/11/2016   Dr Bary Castilla   PORTACATH PLACEMENT Left 08/27/2014   Procedure: INSERTION PORT-A-CATH;  Surgeon: Robert Bellow, MD;  Location: [facility];  Service: General;  Laterality: Left;    There were no vitals filed for this visit.   Subjective Assessment -  07/16/21 0936     Subjective Reports that the day after last session, she was very swimmy headed and had to stay in the bed all day, but not as bad as her initial vertigo and reports that this fully resolved. Went to see MD who changed several of her medications. Mild dizziness right now.    Pertinent History anemia, anxiety, breast CA with R lumpectomy 2016, GERD, HLD, HTN, back surgery 2022, T-L kyphoplasty 2022, B knee replacement 2001/2003    Diagnostic tests none recent    Patient Stated Goals improve dizziness    Currently in Pain? No/denies                [facility] PT Assessment - 07/16/21 0939       Assessment   Medical Diagnosis Dizziness and giddiness    Referring Provider  (PT) [name], MD    Onset Date/Surgical Date 02/22/21      Dynamic Gait Index   Level Surface Moderate Impairment    Change in Gait Speed Moderate Impairment    Gait with Horizontal Head Turns Mild Impairment    Gait with Vertical Head Turns Mild Impairment    Gait and Pivot Turn Mild Impairment    Step Over Obstacle Normal    Step Around Obstacles Moderate Impairment    Steps Mild Impairment    Total Score 14                           [facility] Adult PT Treatment/Exercise - 07/16/21 0001       Therapeutic Activites    Therapeutic Activities Other Therapeutic Activities    Other Therapeutic Activities simulating bed mobility including sit<>sidelying and rolling- c/o 5/10 dizziness with R roll and sidelying to sit             Vestibular Treatment/Exercise - 07/16/21 0001       X1 Viewing Horizontal   Foot Position standing    Comments 30 sec   c/o 5/10 dizziness with L head turn     X1 Viewing Vertical   Foot Position standing    Comments 30 sec   2/10 dizziness                   PT Education - 07/16/21 1014     Education Details review of HEP, discussion on objective progress and remaining impairments, POC    Person(s) Educated Patient    Methods Explanation;Demonstration;Tactile cues;Verbal cues    Comprehension Verbalized understanding              PT Short Term Goals - 07/16/21 1016       PT SHORT TERM GOAL #1   Title Patient to be independent with initial HEP.    Time 3    Period Weeks    Status Achieved    Target Date 06/25/21               PT Long Term Goals - 07/16/21 1016       PT LONG TERM GOAL #1   Title Patient to be independent with advanced HEP.    Time 6    Period Weeks    Status Partially Met   met for current   Target Date 08/27/21      PT LONG TERM GOAL #2   Title Patient to report 0/10 dizziness with standing vertical and horizontal VOR for 30 seconds.    Time 6    Period  Weeks     Status On-going   2-5/10 dizziness   Target Date 08/27/21      PT LONG TERM GOAL #3   Title Patient will report 0/10 dizziness with bed mobility.    Time 6    Period Weeks    Status On-going   5/10 dizziness   Target Date 08/27/21      PT LONG TERM GOAL #4   Title Patient to score at least 20/24 on DGI in order to decrease risk of falls.    Time 6    Period Weeks    Status On-going   14/24   Target Date 08/27/21      PT LONG TERM GOAL #5   Title Patient to report 60% improvement in visual blurring when reading.    Time 6    Period Weeks    Status Achieved    Target Date 07/16/21                   Plan - 07/16/21 1015     Clinical Impression Statement Patient arrived to session with report of an episode of dizziness that lasted all day after her last PT session. Also noting recent change in meds and reporting increased dizziness today. Now reports 2-5/10 dizziness with VOR. Able to perform simulation of bed mobility with c/o 5/10 dizziness with R roll and sidelying to sit. Patient scored 14/24 on DGI, still indicating an increased risk of falls but improved from initial measurement. Notes 60% improvement in visual blurring with reading. Reports blurring occurs more at night- possibly d/t eye strain. Reviewed HEP in detail and answered patient's questions- patient reported understanding. Patient is demonstrating good progress towards goals; measures revealed somewhat limited progress today d/t patient not feeling well since a recent change in meds. Would benefit form additional skilled PT services 1-2x/week for 6 weeks to address remaining goals.    Comorbidities anemia, anxiety, breast CA with R lumpectomy 2016, GERD, HLD, HTN, back surgery 2022, T-L kyphoplasty 2022, B knee replacement 2001/2003    Rehab Potential Good    PT Frequency 2x / week   1-2x   PT Duration 6 weeks    PT Treatment/Interventions ADLs/Self Care Home Management;Canalith Repostioning;Cryotherapy;DME  Instruction;Moist Heat;Gait training;Stair training;Functional mobility training;Therapeutic activities;Therapeutic exercise;Balance training;Neuromuscular re-education;Manual techniques;Patient/family education;Passive range of motion;Dry needling;Energy conservation;Vestibular;Visual/perceptual remediation/compensation;Taping    PT Next Visit Plan Progress HEP, VOR, habituation, convergence; consider adding some postural/balance activities to improve DGI and standing dynamic activities (to patient tolerance).  Also gets L hip pain due to glut med weakness with gait.  Work on activity tolerane.    PT Home Exercise Plan progressed gaze stabilization to standing in corner for VOR x 1    Consulted and Agree with Plan of Care Patient             Patient will benefit from skilled therapeutic intervention in order to improve the following deficits and impairments:  Abnormal gait, Decreased range of motion, Difficulty walking, Dizziness, Decreased activity tolerance, Decreased balance  Visit Diagnosis: Dizziness and giddiness  Unsteadiness on feet     Problem List Patient Active Problem List   Diagnosis Date Noted   Nipple lesion 06/28/2017   Bone pain 10/06/2016   Carcinoma of overlapping sites of right breast in female, estrogen receptor positive ([place]) 12/10/2015   SOB (shortness of breath) 12/03/2014   HLD (hyperlipidemia) 07/28/2014   BP (high blood pressure) 07/28/2014   Allergic rhinitis, seasonal 07/28/2014   Arthritis, degenerative 12/23/2013  Arthritis 01/17/2012    Janene Harvey, PT, DPT 07/16/21 10:18 AM   [facility] [address] [place], [place], [address] Phone: [phone]   Fax:  [phone]  Name: Maria Gutierrez MRN: [id number] Date of Birth: 11/20/1940

## 2021-07-20 ENCOUNTER — Encounter: Payer: Self-pay | Admitting: Physical Therapy

## 2021-07-20 ENCOUNTER — Ambulatory Visit: Payer: Medicare Other | Admitting: Physical Therapy

## 2021-07-20 DIAGNOSIS — R42 Dizziness and giddiness: Secondary | ICD-10-CM

## 2021-07-20 DIAGNOSIS — R2681 Unsteadiness on feet: Secondary | ICD-10-CM

## 2021-07-20 NOTE — Therapy (Signed)
[facility] [address] [place], [place], [address] Phone: [phone]   Fax:  [phone]  Physical Therapy Treatment  Patient Details  Name: Maria Gutierrez MRN: [id number] Date of Birth: 1940-12-10 Referring Provider (PT): [name], MD   Rationale for Evaluation and Treatment Rehabilitation   Encounter Date: 07/20/2021   PT End of Session - 07/20/21 1531     Visit Number 8    Number of Visits 13    Date for PT Re-Evaluation 08/27/21    Authorization Type UHC Medicare    PT Start Time 1448    PT Stop Time 1529    PT Time Calculation (min) 41 min    Activity Tolerance Patient tolerated treatment well    Behavior During Therapy [facility] for tasks assessed/performed             Past Medical History:  Diagnosis Date   Anemia    B12, d2 and magnesium deficiencies   Anxiety    Arthritis    OSTEOARTHRITIS   Breast cancer of upper-outer quadrant of right female breast ([place]) 06/10/2014   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 extensive intermediate grade DCIS.    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   DDD (degenerative disc disease), lumbar    Dyspnea    GERD (gastroesophageal reflux disease)    Heart murmur    History of blood transfusion    Hyperlipidemia    Hypertension    Personal history of chemotherapy 2016   right breast ca   Personal history of radiation therapy 2016   mammosite   SOB (shortness of breath) 12/03/2014    Past Surgical History:  Procedure Laterality Date   ABDOMINAL HYSTERECTOMY     AXILLARY LYMPH NODE BIOPSY Right 07/14/2014   Procedure: AXILLARY LYMPH NODE BIOPSY;  Surgeon: Robert Bellow, MD;  Location: [facility] ORS;  Service: General;  Laterality: Right;   BACK SURGERY  12/29/2020   BOTOX INJECTION N/A 01/13/2020   Procedure: BOTOX INJECTION;  Surgeon: Hollice Espy, MD;  Location: [facility] ORS;  Service: Urology;   Laterality: N/A;   BREAST BIOPSY Left 2012   benign   BREAST BIOPSY Right 05/26/2014   DCIS    BREAST EXCISIONAL BIOPSY Right 06/28/2017    ULCERATED SKIN WITH UNDERLYING FAT NECROSIS, ACUTE INFLAMMATION AND REACTIVE EPITHELIAL ATYPIA   BREAST LUMPECTOMY Right 06/10/2014   DCIS and Invasive ductal carcinoma, clear margins   BREAST SURGERY Right 06/10/2014   Wide excision for intermediate grade DCIS, identification of a 10  millimeter area of invasive mammary carcinoma.   CHOLECYSTECTOMY  2012   COLONOSCOPY  2009   EYE SURGERY Bilateral 2014   cataract   IR KYPHO EA ADDL LEVEL THORACIC OR LUMBAR  02/12/2021   JOINT REPLACEMENT Left 2001   knee   JOINT REPLACEMENT Right 2003   knee   JOINT REPLACEMENT Left 2004   replacement joint broken   kneee surgery Left    PORT-A-CATH REMOVAL  02/11/2016   Dr Bary Castilla   PORTACATH PLACEMENT Left 08/27/2014   Procedure: INSERTION PORT-A-CATH;  Surgeon: Robert Bellow, MD;  Location: [facility] ORS;  Service: General;  Laterality: Left;    There were no vitals filed for this visit.   Subjective Assessment - 07/20/21 1449     Subjective Busy moving campers and has not been as dizzy since last session. Continues to monitor BP and  HR and it has been okay.    Pertinent History anemia, anxiety, breast CA with R lumpectomy 2016, GERD, HLD, HTN, back surgery 2022, T-L kyphoplasty 2022, B knee replacement 2001/2003    Diagnostic tests none recent    Patient Stated Goals improve dizziness    Currently in Pain? No/denies                               [facility] Adult PT Treatment/Exercise - 07/20/21 0001       Neuro Re-ed    Neuro Re-ed Details  R/L fwd/back and side stepping over 1/2 foam roll 10x each, march + horizontal and vertical VOR 2x30"; walking EC 4x, walking backards EC 4x; pencil pushups 5x to ~3 inches away                     PT Education - 07/20/21 1534     Education Details answered patient's questions  about role of potassium in the body and effects of HTN meds on potassium    Person(s) Educated Patient    Methods Explanation    Comprehension Verbalized understanding              PT Short Term Goals - 07/16/21 1016       PT SHORT TERM GOAL #1   Title Patient to be independent with initial HEP.    Time 3    Period Weeks    Status Achieved    Target Date 06/25/21               PT Long Term Goals - 07/16/21 1016       PT LONG TERM GOAL #1   Title Patient to be independent with advanced HEP.    Time 6    Period Weeks    Status Partially Met   met for current   Target Date 08/27/21      PT LONG TERM GOAL #2   Title Patient to report 0/10 dizziness with standing vertical and horizontal VOR for 30 seconds.    Time 6    Period Weeks    Status On-going   2-5/10 dizziness   Target Date 08/27/21      PT LONG TERM GOAL #3   Title Patient will report 0/10 dizziness with bed mobility.    Time 6    Period Weeks    Status On-going   5/10 dizziness   Target Date 08/27/21      PT LONG TERM GOAL #4   Title Patient to score at least 20/24 on DGI in order to decrease risk of falls.    Time 6    Period Weeks    Status On-going   14/24   Target Date 08/27/21      PT LONG TERM GOAL #5   Title Patient to report 60% improvement in visual blurring when reading.    Time 6    Period Weeks    Status Achieved    Target Date 07/16/21                   Plan - 07/20/21 1531     Clinical Impression Statement Patient arrived to session with report of improvement in dizziness since last session. Able to perform stepping strategy over small obstacle while weaning UE support with good stability. Also progressed VOR training to include marching- patient with mild-moderate sway but able to complete without dizziness. Some  hesitation demonstrated with gait backwards and with EC; only c/o "swimmy headedness" occurred with gait with EC. Patient still requires intermittent rest  breaks d/t fatigue and SOB with exertion, but overall tolerated session well and without complaints upon leaving.    Comorbidities anemia, anxiety, breast CA with R lumpectomy 2016, GERD, HLD, HTN, back surgery 2022, T-L kyphoplasty 2022, B knee replacement 2001/2003    Rehab Potential Good    PT Frequency 2x / week   1-2x   PT Duration 6 weeks    PT Treatment/Interventions ADLs/Self Care Home Management;Canalith Repostioning;Cryotherapy;DME Instruction;Moist Heat;Gait training;Stair training;Functional mobility training;Therapeutic activities;Therapeutic exercise;Balance training;Neuromuscular re-education;Manual techniques;Patient/family education;Passive range of motion;Dry needling;Energy conservation;Vestibular;Visual/perceptual remediation/compensation;Taping    PT Next Visit Plan Progress HEP, VOR, habituation consider adding some postural/balance activities to improve DGI and standing dynamic activities (to patient tolerance). Work on activity tolerane.    PT Home Exercise Plan progressed gaze stabilization to standing in corner for VOR x 1    Consulted and Agree with Plan of Care Patient             Patient will benefit from skilled therapeutic intervention in order to improve the following deficits and impairments:  Abnormal gait, Decreased range of motion, Difficulty walking, Dizziness, Decreased activity tolerance, Decreased balance  Visit Diagnosis: Dizziness and giddiness  Unsteadiness on feet     Problem List Patient Active Problem List   Diagnosis Date Noted   Nipple lesion 06/28/2017   Bone pain 10/06/2016   Carcinoma of overlapping sites of right breast in female, estrogen receptor positive ([place]) 12/10/2015   SOB (shortness of breath) 12/03/2014   HLD (hyperlipidemia) 07/28/2014   BP (high blood pressure) 07/28/2014   Allergic rhinitis, seasonal 07/28/2014   Arthritis, degenerative 12/23/2013   Arthritis 01/17/2012    Janene Harvey, PT, DPT 07/20/21  3:35 PM   [facility] [address], [place], [place], [address] Phone: [phone]   Fax:  [phone]  Name: Maria Gutierrez MRN: [id number] Date of Birth: [birth year]

## 2021-07-28 ENCOUNTER — Ambulatory Visit: Payer: Medicare Other | Admitting: Physical Therapy

## 2021-07-28 ENCOUNTER — Encounter: Payer: Self-pay | Admitting: Physical Therapy

## 2021-07-28 VITALS — BP 124/90 | HR 80

## 2021-07-28 DIAGNOSIS — R42 Dizziness and giddiness: Secondary | ICD-10-CM

## 2021-07-28 DIAGNOSIS — R2681 Unsteadiness on feet: Secondary | ICD-10-CM

## 2021-07-28 NOTE — Therapy (Signed)
[facility] [address] [place], [place], [address] Phone: [phone]   Fax:  [phone]  Physical Therapy Treatment  Patient Details  Name: Maria Gutierrez MRN: [id number] Date of Birth: 26-May-1940 Referring Provider (PT): [name], MD  Rationale for Evaluation and Treatment Rehabilitation   Encounter Date: 07/28/2021   PT End of Session - 07/28/21 1313     Visit Number 9    Number of Visits 13    Date for PT Re-Evaluation 08/27/21    Authorization Type UHC Medicare    PT Start Time 1234    PT Stop Time 1313    PT Time Calculation (min) 39 min    Activity Tolerance Patient tolerated treatment well    Behavior During Therapy [facility] for tasks assessed/performed             Past Medical History:  Diagnosis Date   Anemia    B12, d2 and magnesium deficiencies   Anxiety    Arthritis    OSTEOARTHRITIS   Breast cancer of upper-outer quadrant of right female breast ([place]) 06/10/2014   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 extensive intermediate grade DCIS.    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   DDD (degenerative disc disease), lumbar    Dyspnea    GERD (gastroesophageal reflux disease)    Heart murmur    History of blood transfusion    Hyperlipidemia    Hypertension    Personal history of chemotherapy 2016   right breast ca   Personal history of radiation therapy 2016   mammosite   SOB (shortness of breath) 12/03/2014    Past Surgical History:  Procedure Laterality Date   ABDOMINAL HYSTERECTOMY     AXILLARY LYMPH NODE BIOPSY Right 07/14/2014   Procedure: AXILLARY LYMPH NODE BIOPSY;  Surgeon: Robert Bellow, MD;  Location: [facility];  Service: General;  Laterality: Right;   BACK SURGERY  12/29/2020   BOTOX INJECTION N/A 01/13/2020   Procedure: BOTOX INJECTION;  Surgeon: Hollice Espy, MD;  Location: [facility];  Service: Urology;   Laterality: N/A;   BREAST BIOPSY Left 2012   benign   BREAST BIOPSY Right 05/26/2014   DCIS    BREAST EXCISIONAL BIOPSY Right 06/28/2017    ULCERATED SKIN WITH UNDERLYING FAT NECROSIS, ACUTE INFLAMMATION AND REACTIVE EPITHELIAL ATYPIA   BREAST LUMPECTOMY Right 06/10/2014   DCIS and Invasive ductal carcinoma, clear margins   BREAST SURGERY Right 06/10/2014   Wide excision for intermediate grade DCIS, identification of a 10  millimeter area of invasive mammary carcinoma.   CHOLECYSTECTOMY  2012   COLONOSCOPY  2009   EYE SURGERY Bilateral 2014   cataract   IR KYPHO EA ADDL LEVEL THORACIC OR LUMBAR  02/12/2021   JOINT REPLACEMENT Left 2001   knee   JOINT REPLACEMENT Right 2003   knee   JOINT REPLACEMENT Left 2004   replacement joint broken   kneee surgery Left    PORT-A-CATH REMOVAL  02/11/2016   Dr Bary Castilla   PORTACATH PLACEMENT Left 08/27/2014   Procedure: INSERTION PORT-A-CATH;  Surgeon: Robert Bellow, MD;  Location: [facility];  Service: General;  Laterality: Left;    Vitals:   07/28/21 1237  BP: 124/90  Pulse: 80  SpO2: 92%     Subjective Assessment - 07/28/21 1235     Subjective Had to go for blood work this AM. Has been  a little swimmyheaded.    Pertinent History anemia, anxiety, breast CA with R lumpectomy 2016, GERD, HLD, HTN, back surgery 2022, T-L kyphoplasty 2022, B knee replacement 2001/2003    Diagnostic tests none recent    Patient Stated Goals improve dizziness    Currently in Pain? No/denies                               [facility] Adult PT Treatment/Exercise - 07/28/21 0001       Neuro Re-ed    Neuro Re-ed Details  1/4 turns to targets with 1 UE support on counter 10x each ; gait + vertical ball toss with tracking 2x35f; sitting vertical and horizontal ball toss in sitting; side step +picking up cones from floor; tandem walk in II bars with occasional 1 UE support; alt toe tap on cones                       PT Short  Term Goals - 07/16/21 1016       PT SHORT TERM GOAL #1   Title Patient to be independent with initial HEP.    Time 3    Period Weeks    Status Achieved    Target Date 06/25/21               PT Long Term Goals - 07/16/21 1016       PT LONG TERM GOAL #1   Title Patient to be independent with advanced HEP.    Time 6    Period Weeks    Status Partially Met   met for current   Target Date 08/27/21      PT LONG TERM GOAL #2   Title Patient to report 0/10 dizziness with standing vertical and horizontal VOR for 30 seconds.    Time 6    Period Weeks    Status On-going   2-5/10 dizziness   Target Date 08/27/21      PT LONG TERM GOAL #3   Title Patient will report 0/10 dizziness with bed mobility.    Time 6    Period Weeks    Status On-going   5/10 dizziness   Target Date 08/27/21      PT LONG TERM GOAL #4   Title Patient to score at least 20/24 on DGI in order to decrease risk of falls.    Time 6    Period Weeks    Status On-going   14/24   Target Date 08/27/21      PT LONG TERM GOAL #5   Title Patient to report 60% improvement in visual blurring when reading.    Time 6    Period Weeks    Status Achieved    Target Date 07/16/21                   Plan - 07/28/21 1314     Clinical Impression Statement Patient arrived to session with report of some "swimmyheadedness" today after getting bloodwork done earlier in the day. [facility] start [facility] session [facility] while monitoring patient's symptoms. Patient performed standing balance and gaze stability activities today. Able to complete  turns with good speed. Patient denied dizziness but report of feeling "off." Required sitting rest break after walking balance activities d/t exertional SOB; patient was instructed on pursed lip breathing to address this. Also able to demonstrate forward bending activities with good  stability, however quick to fatigue. Patient does report that this is pre-morbid.  Patient tolerated session fairly well and without complaints upon leaving.    Comorbidities anemia, anxiety, breast CA with R lumpectomy 2016, GERD, HLD, HTN, back surgery 2022, T-L kyphoplasty 2022, B knee replacement 2001/2003    Rehab Potential Good    PT Frequency 2x / week   1-2x   PT Duration 6 weeks    PT Treatment/Interventions ADLs/Self Care Home Management;Canalith Repostioning;Cryotherapy;DME Instruction;Moist Heat;Gait training;Stair training;Functional mobility training;Therapeutic activities;Therapeutic exercise;Balance training;Neuromuscular re-education;Manual techniques;Patient/family education;Passive range of motion;Dry needling;Energy conservation;Vestibular;Visual/perceptual remediation/compensation;Taping    PT Next Visit Plan Progress HEP, VOR, habituation consider adding some postural/balance activities to improve DGI and standing dynamic activities (to patient tolerance). Work on activity tolerane.    PT Home Exercise Plan progressed gaze stabilization to standing in corner for VOR x 1    Consulted and Agree with Plan of Care Patient             Patient will benefit from skilled therapeutic intervention in order to improve the following deficits and impairments:  Abnormal gait, Decreased range of motion, Difficulty walking, Dizziness, Decreased activity tolerance, Decreased balance  Visit Diagnosis: Dizziness and giddiness  Unsteadiness on feet     Problem List Patient Active Problem List   Diagnosis Date Noted   Nipple lesion 06/28/2017   Bone pain 10/06/2016   Carcinoma of overlapping sites of right breast in female, estrogen receptor positive ([place]) 12/10/2015   SOB (shortness of breath) 12/03/2014   HLD (hyperlipidemia) 07/28/2014   BP (high blood pressure) 07/28/2014   Allergic rhinitis, seasonal 07/28/2014   Arthritis, degenerative 12/23/2013   Arthritis 01/17/2012    Janene Harvey, PT, DPT 07/28/21 1:15 PM   [facility] [address] [place], [place], [address] Phone: [phone]   Fax:  [phone]  Name: Maria Gutierrez MRN: [id number] Date of Birth: 01-15-1941

## 2021-08-04 ENCOUNTER — Ambulatory Visit: Payer: Medicare Other | Admitting: Physical Therapy

## 2021-08-10 ENCOUNTER — Ambulatory Visit: Payer: Medicare Other | Attending: Family Medicine | Admitting: Physical Therapy

## 2021-08-10 ENCOUNTER — Encounter: Payer: Self-pay | Admitting: Physical Therapy

## 2021-08-10 VITALS — HR 78

## 2021-08-10 DIAGNOSIS — R42 Dizziness and giddiness: Secondary | ICD-10-CM | POA: Diagnosis present

## 2021-08-10 DIAGNOSIS — R2681 Unsteadiness on feet: Secondary | ICD-10-CM | POA: Diagnosis present

## 2021-08-10 NOTE — Therapy (Signed)
[facility] [address] [place], [place], [address] Phone: [phone]   Fax:  [phone]  Physical Therapy Treatment  Patient Details  Name: Maria Gutierrez MRN: [id number] Date of Birth: 1940-08-07 Referring Provider (PT): [name], MD   Encounter Date: 08/10/2021   PT End of Session - 08/10/21 1226     Visit Number 10    Number of Visits 13    Date for PT Re-Evaluation 08/27/21    Authorization Type UHC Medicare    PT Start Time 1149    PT Stop Time 1224    PT Time Calculation (min) 35 min    Activity Tolerance Patient tolerated treatment well;Patient limited by fatigue    Behavior During Therapy [facility] for tasks assessed/performed             Past Medical History:  Diagnosis Date   Anemia    B12, d2 and magnesium deficiencies   Anxiety    Arthritis    OSTEOARTHRITIS   Breast cancer of upper-outer quadrant of right female breast ([place]) 06/10/2014   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 extensive intermediate grade DCIS.    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   DDD (degenerative disc disease), lumbar    Dyspnea    GERD (gastroesophageal reflux disease)    Heart murmur    History of blood transfusion    Hyperlipidemia    Hypertension    Personal history of chemotherapy 2016   right breast ca   Personal history of radiation therapy 2016   mammosite   SOB (shortness of breath) 12/03/2014    Past Surgical History:  Procedure Laterality Date   ABDOMINAL HYSTERECTOMY     AXILLARY LYMPH NODE BIOPSY Right 07/14/2014   Procedure: AXILLARY LYMPH NODE BIOPSY;  Surgeon: Robert Bellow, MD;  Location: [facility];  Service: General;  Laterality: Right;   BACK SURGERY  12/29/2020   BOTOX INJECTION N/A 01/13/2020   Procedure: BOTOX INJECTION;  Surgeon: Hollice Espy, MD;  Location: [facility];  Service: Urology;  Laterality: N/A;   BREAST BIOPSY  Left 2012   benign   BREAST BIOPSY Right 05/26/2014   DCIS    BREAST EXCISIONAL BIOPSY Right 06/28/2017    ULCERATED SKIN WITH UNDERLYING FAT NECROSIS, ACUTE INFLAMMATION AND REACTIVE EPITHELIAL ATYPIA   BREAST LUMPECTOMY Right 06/10/2014   DCIS and Invasive ductal carcinoma, clear margins   BREAST SURGERY Right 06/10/2014   Wide excision for intermediate grade DCIS, identification of a 10  millimeter area of invasive mammary carcinoma.   CHOLECYSTECTOMY  2012   COLONOSCOPY  2009   EYE SURGERY Bilateral 2014   cataract   IR KYPHO EA ADDL LEVEL THORACIC OR LUMBAR  02/12/2021   JOINT REPLACEMENT Left 2001   knee   JOINT REPLACEMENT Right 2003   knee   JOINT REPLACEMENT Left 2004   replacement joint broken   kneee surgery Left    PORT-A-CATH REMOVAL  02/11/2016   Dr Bary Castilla   PORTACATH PLACEMENT Left 08/27/2014   Procedure: INSERTION PORT-A-CATH;  Surgeon: Robert Bellow, MD;  Location: [facility] OR[facility];  Service: General;  Laterality: Left;    Vitals:   08/10/21 1201 08/10/21 1213  Pulse: 72 78  SpO2: 91% 94%     Subjective Assessment - 08/10/21 1149     Subjective Had a cold last week and still coughing. Dizziness has been pretty good  and balance is feeling a little better.    Pertinent History anemia, anxiety, breast CA with R lumpectomy 2016, GERD, HLD, HTN, back surgery 2022, T-L kyphoplasty 2022, B knee replacement 2001/2003    Diagnostic tests none recent    Patient Stated Goals improve dizziness    Currently in Pain? No/denies                               [facility] Adult PT Treatment/Exercise - 08/10/21 0001       Neuro Re-ed    Neuro Re-ed Details  alt toe tap for speed on 6" step 3x30",  figure 8 around cones- requiring CGA-min A d/t LOB;  gait + head turns/nods 2x50ft each with CGA; 1/2 turns to targets;   walking backwards in II bars, lateral step over hurdle 10x                       PT Short Term Goals - 07/16/21 1016       PT  SHORT TERM GOAL #1   Title Patient to be independent with initial HEP.    Time 3    Period Weeks    Status Achieved    Target Date 06/25/21               PT Long Term Goals - 07/16/21 1016       PT LONG TERM GOAL #1   Title Patient to be independent with advanced HEP.    Time 6    Period Weeks    Status Partially Met   met for current   Target Date 08/27/21      PT LONG TERM GOAL #2   Title Patient to report 0/10 dizziness with standing vertical and horizontal VOR for 30 seconds.    Time 6    Period Weeks    Status On-going   2-5/10 dizziness   Target Date 08/27/21      PT LONG TERM GOAL #3   Title Patient will report 0/10 dizziness with bed mobility.    Time 6    Period Weeks    Status On-going   5/10 dizziness   Target Date 08/27/21      PT LONG TERM GOAL #4   Title Patient to score at least 20/24 on DGI in order to decrease risk of falls.    Time 6    Period Weeks    Status On-going   14/24   Target Date 08/27/21      PT LONG TERM GOAL #5   Title Patient to report 60% improvement in visual blurring when reading.    Time 6    Period Weeks    Status Achieved    Target Date 07/16/21                   Plan - 08/10/21 1226     Clinical Impression Statement Patient arrived to session with report of having a cold last week. Reports improvement in balance and dizziness. Patient performed speed work without UE support; performed with good stability but required standing rest break to address SOB with exertion. Demonstrated R pulsion with gait with head turns/nods, requiring CGA for safety. Patient able to perform relatively quick  turns without symptoms of dizziness. Overall patient was able to perform all balance activities without UE support, demonstrating an excellent improvement in balance and balance confidence. Most limited by SOB with   exertion, requiring rest breaks throughout. Patient without complaints at end of session.    Comorbidities anemia,  anxiety, breast CA with R lumpectomy 2016, GERD, HLD, HTN, back surgery 2022, T-L kyphoplasty 2022, B knee replacement 2001/2003    Rehab Potential Good    PT Frequency 2x / week   1-2x   PT Duration 6 weeks    PT Treatment/Interventions ADLs/Self Care Home Management;Canalith Repostioning;Cryotherapy;DME Instruction;Moist Heat;Gait training;Stair training;Functional mobility training;Therapeutic activities;Therapeutic exercise;Balance training;Neuromuscular re-education;Manual techniques;Patient/family education;Passive range of motion;Dry needling;Energy conservation;Vestibular;Visual/perceptual remediation/compensation;Taping    PT Next Visit Plan Progress HEP, VOR, habituation consider adding some postural/balance activities to improve DGI and standing dynamic activities (to patient tolerance). Work on activity tolerane.    PT Home Exercise Plan progressed gaze stabilization to standing in corner for VOR x 1    Consulted and Agree with Plan of Care Patient             Patient will benefit from skilled therapeutic intervention in order to improve the following deficits and impairments:  Abnormal gait, Decreased range of motion, Difficulty walking, Dizziness, Decreased activity tolerance, Decreased balance  Visit Diagnosis: Dizziness and giddiness  Unsteadiness on feet     Problem List Patient Active Problem List   Diagnosis Date Noted   Nipple lesion 06/28/2017   Bone pain 10/06/2016   Carcinoma of overlapping sites of right breast in female, estrogen receptor positive ([facility]) 12/10/2015   SOB (shortness of breath) 12/03/2014   HLD (hyperlipidemia) 07/28/2014   BP (high blood pressure) 07/28/2014   Allergic rhinitis, seasonal 07/28/2014   Arthritis, degenerative 12/23/2013   Arthritis 01/17/2012    Yevgeniya Kovalenko, PT, DPT 08/10/21 12:31 PM  [facility] [address], [address] [place], [place] [address] Phone # [phone] Fax # [phone]   [facility] [address], [address] [place], [place], [address] Phone: [phone]   Fax:  [phone]  Name: Maria Gutierrez MRN: [id number] Date of Birth: 05/20/1940    

## 2021-08-18 ENCOUNTER — Ambulatory Visit: Payer: Medicare Other | Admitting: Physical Therapy

## 2021-08-19 ENCOUNTER — Encounter: Payer: Self-pay | Admitting: Physical Therapy

## 2021-08-19 ENCOUNTER — Ambulatory Visit: Payer: Medicare Other | Admitting: Physical Therapy

## 2021-08-19 DIAGNOSIS — R42 Dizziness and giddiness: Secondary | ICD-10-CM | POA: Diagnosis not present

## 2021-08-19 DIAGNOSIS — R2681 Unsteadiness on feet: Secondary | ICD-10-CM

## 2021-08-19 NOTE — Patient Instructions (Signed)
Access Code: [email or web address] URL: [email or web address] Date: 08/19/2021 Prepared by: [facility]  *perform at counter top, corner, or doorway for safety  Exercises - Standing with Head Rotation  - 2 x daily - 7 x weekly - 1 sets - 30 sec hold - Standing with Head Nod  - 2 x daily - 7 x weekly - 1 sets - 30 sec hold - 180 Degree Pivot Turn - 1 x daily - 5 x weekly - 2 sets - 10 reps - Standing Toe Taps  - 1 x daily - 5 x weekly - 2 sets - 10 reps - Alternating Step Forward with Support  - 1 x daily - 5 x weekly - 2 sets - 10 reps - Backward Walking with Counter Support  - 1 x daily - 5 x weekly - 2 sets - 10 reps

## 2021-08-19 NOTE — Therapy (Signed)
[facility] [address], [place], [place], [address] Phone: [phone]   Fax:  [phone]  Physical Therapy Discharge Summary  Patient Details  Name: Maria Gutierrez MRN: [id number] Date of Birth: 1941/02/08 Referring Provider (PT): [name], MD   Progress Note Reporting Period 07/20/21 to 08/19/21  See note below for Objective Data and Assessment of Progress/Goals.      Encounter Date: 08/19/2021   PT End of Session - 08/19/21 1606     Visit Number 11    Number of Visits 13    Date for PT Re-Evaluation 08/27/21    Authorization Type [employer] Medicare    PT Start Time 1524    PT Stop Time 1558    PT Time Calculation (min) 34 min    Activity Tolerance Patient tolerated treatment well    Behavior During Therapy WFL for tasks assessed/performed             Past Medical History:  Diagnosis Date   Anemia    B12, d2 and magnesium deficiencies   Anxiety    Arthritis    OSTEOARTHRITIS   Breast cancer of upper-outer quadrant of right female breast ([place]) 06/10/2014   10 mm invasive mammary carcinoma, T1b,Nx; ER 90%, 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 extensive intermediate grade DCIS.    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   DDD (degenerative disc disease), lumbar    Dyspnea    GERD (gastroesophageal reflux disease)    Heart murmur    History of blood transfusion    Hyperlipidemia    Hypertension    Personal history of chemotherapy 2016   right breast ca   Personal history of radiation therapy 2016   mammosite   SOB (shortness of breath) 12/03/2014    Past Surgical History:  Procedure Laterality Date   ABDOMINAL HYSTERECTOMY     AXILLARY LYMPH NODE BIOPSY Right 07/14/2014   Procedure: AXILLARY LYMPH NODE BIOPSY;  Surgeon: Robert Bellow, MD;  Location: [facility] ORS;  Service: General;  Laterality: Right;   BACK SURGERY  12/29/2020   BOTOX INJECTION N/A 01/13/2020   Procedure: BOTOX  INJECTION;  Surgeon: Hollice Espy, MD;  Location: [facility] ORS;  Service: Urology;  Laterality: N/A;   BREAST BIOPSY Left 2012   benign   BREAST BIOPSY Right 05/26/2014   DCIS    BREAST EXCISIONAL BIOPSY Right 06/28/2017    ULCERATED SKIN WITH UNDERLYING FAT NECROSIS, ACUTE INFLAMMATION AND REACTIVE EPITHELIAL ATYPIA   BREAST LUMPECTOMY Right 06/10/2014   DCIS and Invasive ductal carcinoma, clear margins   BREAST SURGERY Right 06/10/2014   Wide excision for intermediate grade DCIS, identification of a 10  millimeter area of invasive mammary carcinoma.   CHOLECYSTECTOMY  2012   COLONOSCOPY  2009   EYE SURGERY Bilateral 2014   cataract   IR KYPHO EA ADDL LEVEL THORACIC OR LUMBAR  02/12/2021   JOINT REPLACEMENT Left 2001   knee   JOINT REPLACEMENT Right 2003   knee   JOINT REPLACEMENT Left 2004   replacement joint broken   kneee surgery Left    PORT-A-CATH REMOVAL  02/11/2016   Dr Bary Castilla   PORTACATH PLACEMENT Left 08/27/2014   Procedure: INSERTION PORT-A-CATH;  Surgeon: Robert Bellow, MD;  Location: [facility] ORS;  Service: General;  Laterality: Left;    There were no vitals filed for this visit.   Subjective Assessment - 08/19/21 1524     Subjective Had  a good time at the beach.    Pertinent History anemia, anxiety, breast CA with R lumpectomy 2016, GERD, HLD, HTN, back surgery 2022, T-L kyphoplasty 2022, B knee replacement 2001/2003    Diagnostic tests none recent    Patient Stated Goals improve dizziness    Currently in Pain? Yes    Pain Score 6     Pain Location Hip    Pain Orientation Left    Pain Descriptors / Indicators Aching                [facility] PT Assessment - 08/19/21 1527       Assessment   Medical Diagnosis Dizziness and giddiness    Referring Provider (PT) [name], MD    Onset Date/Surgical Date 02/22/21      Dynamic Gait Index   Level Surface Moderate Impairment    Change in Gait Speed Mild Impairment    Gait with Horizontal Head Turns  Normal    Gait with Vertical Head Turns Normal    Gait and Pivot Turn Normal    Step Over Obstacle Normal    Step Around Obstacles Normal    Steps Mild Impairment    Total Score 20                            Vestibular Treatment/Exercise - 08/19/21 0001       Vestibular Treatment/Exercise   Habituation Exercises --   simulation of bed mobility including sit>supine>R sidelying>supine>sit c/o mild wooziness upon sitting up     X1 Viewing Horizontal   Foot Position standing    Comments 30 sec   no dizziness     X1 Viewing Vertical   Foot Position standing    Comments 30 sec   0/10 dizziness                   PT Education - 08/19/21 1600     Education Details discussion on objective progress; HEP update    Person(s) Educated Patient    Methods Explanation;Demonstration;Tactile cues;Verbal cues;Handout    Comprehension Verbalized understanding              PT Short Term Goals - 08/19/21 1600       PT SHORT TERM GOAL #1   Title Patient to be independent with initial HEP.    Time 3    Period Weeks    Status Achieved    Target Date 06/25/21               PT Long Term Goals - 08/19/21 1600       PT LONG TERM GOAL #1   Title Patient to be independent with advanced HEP.    Time 6    Period Weeks    Status Achieved    Target Date 08/27/21      PT LONG TERM GOAL #2   Title Patient to report 0/10 dizziness with standing vertical and horizontal VOR for 30 seconds.    Time 6    Period Weeks    Status Achieved    Target Date 08/27/21      PT LONG TERM GOAL #3   Title Patient will report 0/10 dizziness with bed mobility.    Time 6    Period Weeks    Status Partially Met   reports mild dizziness upon sitting up   Target Date 08/27/21      PT LONG TERM GOAL #4  Title Patient to score at least 20/24 on DGI in order to decrease risk of falls.    Time 6    Period Weeks    Status Achieved    Target Date 08/27/21      PT LONG  TERM GOAL #5   Title Patient to report 60% improvement in visual blurring when reading.    Time 6    Period Weeks    Status Achieved    Target Date 07/16/21                   Plan - 08/19/21 1605     Clinical Impression Statement Patient arrived to session without new complaints. Patient now reports 0/10 dizziness with VOR activities and c/o only mild dizziness with bed mobility Patient scored 20/24 on DGI, demonstrating good improvement since initial measurement and indicating a decreased risk of falls.  Patient notes 100% improvement in visual blurring when reading as she no longer experiences this. HEP was updated for continued vestibular health and balance. At this time patient has demonstrated good progress and has met or partially met all goals. Ready for DC with transition to HEP at this time.    Comorbidities anemia, anxiety, breast CA with R lumpectomy 2016, GERD, HLD, HTN, back surgery 2022, T-L kyphoplasty 2022, B knee replacement 2001/2003    Rehab Potential Good    PT Frequency 2x / week   1-2x   PT Duration 6 weeks    PT Treatment/Interventions ADLs/Self Care Home Management;Canalith Repostioning;Cryotherapy;DME Instruction;Moist Heat;Gait training;Stair training;Functional mobility training;Therapeutic activities;Therapeutic exercise;Balance training;Neuromuscular re-education;Manual techniques;Patient/family education;Passive range of motion;Dry needling;Energy conservation;Vestibular;Visual/perceptual remediation/compensation;Taping    PT Next Visit Plan DC at this time    PT Home Exercise Plan progressed gaze stabilization to standing in corner for VOR x 1    Consulted and Agree with Plan of Care Patient             Patient will benefit from skilled therapeutic intervention in order to improve the following deficits and impairments:  Abnormal gait, Decreased range of motion, Difficulty walking, Dizziness, Decreased activity tolerance, Decreased balance  Visit  Diagnosis: Dizziness and giddiness  Unsteadiness on feet     Problem List Patient Active Problem List   Diagnosis Date Noted   Nipple lesion 06/28/2017   Bone pain 10/06/2016   Carcinoma of overlapping sites of right breast in female, estrogen receptor positive ([place]) 12/10/2015   SOB (shortness of breath) 12/03/2014   HLD (hyperlipidemia) 07/28/2014   BP (high blood pressure) 07/28/2014   Allergic rhinitis, seasonal 07/28/2014   Arthritis, degenerative 12/23/2013   Arthritis 01/17/2012    PHYSICAL THERAPY DISCHARGE SUMMARY  Visits from Start of Care: 11  Current functional level related to goals / functional outcomes: See above clinical impression   Remaining deficits: Mild dizziness with bed mobility   Education / Equipment: HEP  Plan: Patient agrees to discharge.  Patient goals were partially met. Patient is being discharged due to meeting the stated rehab goals.       Janene Harvey, PT, DPT 08/19/21 4:09 PM  [facility] [address] [place], [place], [place] [address] Phone # [phone] Fax # [phone]  [facility] [address] [place], [place], [address] Phone: [phone]   Fax:  [phone]  Name: Maria Gutierrez MRN: [id number] Date of Birth: 08-16-1940

## 2021-08-25 ENCOUNTER — Ambulatory Visit: Payer: Medicare Other | Admitting: Physical Therapy

## 2021-09-07 ENCOUNTER — Encounter: Payer: Medicare Other | Admitting: Physical Therapy

## 2021-10-06 ENCOUNTER — Ambulatory Visit
Admission: RE | Admit: 2021-10-06 | Discharge: 2021-10-06 | Disposition: A | Discharge status: Home / Self Care | Payer: Medicare Other | Source: Ambulatory Visit | Attending: Internal Medicine | Admitting: Internal Medicine

## 2021-10-06 DIAGNOSIS — Z170 Estrogen receptor positive status [ER+]: Secondary | ICD-10-CM

## 2021-10-06 DIAGNOSIS — Z780 Asymptomatic menopausal state: Secondary | ICD-10-CM | POA: Diagnosis not present

## 2021-10-06 DIAGNOSIS — Z13820 Encounter for screening for osteoporosis: Secondary | ICD-10-CM | POA: Diagnosis not present

## 2021-10-06 DIAGNOSIS — Z9221 Personal history of antineoplastic chemotherapy: Secondary | ICD-10-CM | POA: Diagnosis not present

## 2021-10-06 DIAGNOSIS — M8588 Other specified disorders of bone density and structure, other site: Secondary | ICD-10-CM | POA: Diagnosis not present

## 2021-10-06 DIAGNOSIS — Z923 Personal history of irradiation: Secondary | ICD-10-CM | POA: Insufficient documentation

## 2021-10-06 DIAGNOSIS — Z1231 Encounter for screening mammogram for malignant neoplasm of breast: Secondary | ICD-10-CM | POA: Insufficient documentation

## 2021-10-06 DIAGNOSIS — Z853 Personal history of malignant neoplasm of breast: Secondary | ICD-10-CM | POA: Insufficient documentation

## 2021-10-07 ENCOUNTER — Other Ambulatory Visit: Payer: Medicare Other

## 2021-10-07 ENCOUNTER — Ambulatory Visit: Payer: Medicare Other | Admitting: Internal Medicine

## 2021-10-08 ENCOUNTER — Inpatient Hospital Stay: Payer: Medicare Other | Admitting: Internal Medicine

## 2021-10-08 ENCOUNTER — Inpatient Hospital Stay: Payer: Medicare Other | Attending: Internal Medicine

## 2021-10-08 ENCOUNTER — Inpatient Hospital Stay (HOSPITAL_BASED_OUTPATIENT_CLINIC_OR_DEPARTMENT_OTHER): Payer: Medicare Other | Admitting: Medical Oncology

## 2021-10-08 ENCOUNTER — Encounter: Payer: Self-pay | Admitting: Medical Oncology

## 2021-10-08 VITALS — BP 115/71 | HR 65 | Temp 98.5°F | Resp 18 | Wt 276.4 lb

## 2021-10-08 DIAGNOSIS — M791 Myalgia, unspecified site: Secondary | ICD-10-CM | POA: Diagnosis not present

## 2021-10-08 DIAGNOSIS — C50811 Malignant neoplasm of overlapping sites of right female breast: Secondary | ICD-10-CM

## 2021-10-08 DIAGNOSIS — M8580 Other specified disorders of bone density and structure, unspecified site: Secondary | ICD-10-CM | POA: Diagnosis not present

## 2021-10-08 DIAGNOSIS — Z79810 Long term (current) use of selective estrogen receptor modulators (SERMs): Secondary | ICD-10-CM | POA: Insufficient documentation

## 2021-10-08 DIAGNOSIS — Z170 Estrogen receptor positive status [ER+]: Secondary | ICD-10-CM

## 2021-10-08 LAB — CBC WITH DIFFERENTIAL/PLATELET
Abs Immature Granulocytes: 0.03 10*3/uL (ref 0.00–0.07)
Basophils Absolute: 0.1 10*3/uL (ref 0.0–0.1)
Basophils Relative: 0 %
Eosinophils Absolute: 0.2 10*3/uL (ref 0.0–0.5)
Eosinophils Relative: 2 %
HCT: 43.9 % (ref 36.0–46.0)
Hemoglobin: 14.8 g/dL (ref 12.0–15.0)
Immature Granulocytes: 0 %
Lymphocytes Relative: 36 %
Lymphs Abs: 4.6 10*3/uL — ABNORMAL HIGH (ref 0.7–4.0)
MCH: 31.9 pg (ref 26.0–34.0)
MCHC: 33.7 g/dL (ref 30.0–36.0)
MCV: 94.6 fL (ref 80.0–100.0)
Monocytes Absolute: 1.1 10*3/uL — ABNORMAL HIGH (ref 0.1–1.0)
Monocytes Relative: 9 %
Neutro Abs: 6.6 10*3/uL (ref 1.7–7.7)
Neutrophils Relative %: 53 %
Platelets: 185 10*3/uL (ref 150–400)
RBC: 4.64 MIL/uL (ref 3.87–5.11)
RDW: 13.1 % (ref 11.5–15.5)
WBC: 12.6 10*3/uL — ABNORMAL HIGH (ref 4.0–10.5)
nRBC: 0 % (ref 0.0–0.2)

## 2021-10-08 LAB — COMPREHENSIVE METABOLIC PANEL
ALT: 20 U/L (ref 0–44)
AST: 29 U/L (ref 15–41)
Albumin: 3.7 g/dL (ref 3.5–5.0)
Alkaline Phosphatase: 54 U/L (ref 38–126)
Anion gap: 12 (ref 5–15)
BUN: 23 mg/dL (ref 8–23)
CO2: 27 mmol/L (ref 22–32)
Calcium: 9.1 mg/dL (ref 8.9–10.3)
Chloride: 102 mmol/L (ref 98–111)
Creatinine, Ser: 1.4 mg/dL — ABNORMAL HIGH (ref 0.44–1.00)
GFR, Estimated: 38 mL/min — ABNORMAL LOW (ref >60)
Glucose, Bld: 96 mg/dL (ref 70–99)
Potassium: 3.9 mmol/L (ref 3.5–5.1)
Sodium: 141 mmol/L (ref 135–145)
Total Bilirubin: 1.1 mg/dL (ref 0.3–1.2)
Total Protein: 6.8 g/dL (ref 6.5–8.1)

## 2021-10-08 LAB — VITAMIN D 25 HYDROXY (VIT D DEFICIENCY, FRACTURES): Vit D, 25-Hydroxy: 94.07 ng/mL (ref 30–100)

## 2021-10-08 MED ORDER — PREDNISONE 10 MG PO TABS: ORAL_TABLET | ORAL | 0 refills | Status: DC

## 2021-10-08 NOTE — Progress Notes (Signed)
Pt states that she has been experiencing breast pain that radiates to her shoulder blade for the past three weeks.

## 2021-10-08 NOTE — Progress Notes (Signed)
[facility] OFFICE PROGRESS NOTE  Patient Care Team: [name], MD as PCP - General (Family Medicine) [name], MD as Referring Physician (Family Medicine) Bary Castilla, Forest Gleason, MD (General Surgery) Cammie Sickle, MD as Consulting Physician (Oncology)   Cancer Staging  No matching staging information was found for the patient.   Oncology History Overview Note  1.  Carcinoma of right breast.  Status post lumpectomy and sentinel lymph node evaluation.  Tumor size 1 cm.  Estrogen and progesterone receptor positive.  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  Diagnosis in April of 2016 2.  Accelerated partial breast radiation in June of 2016 3.  Weekly Taxol and Herceptin therapy for adjuvant treatment starting from July of 2016; Finished herceptin July 2017   # 5.Patient has been taken off letrozole because of bony pain and started on tamoxifen 20 mg by mouth daily from May 12, 2015; Tamoxifen [Hx of TAH]  # BMD sep 2018- Osteopenia- 1.3;    DIAGNOSIS: BREAST CA ER/PR/her 2 NEU POSITIVE  STAGE:  I ;GOALS: cure  CURRENT/MOST RECENT THERAPY- [  Tamoxifen]    Carcinoma of overlapping sites of right breast in female, estrogen receptor positive ([place])      INTERVAL HISTORY: Walks with a cane.  Ambulating independently otherwise.  Alone.  Maria Gutierrez 81 y.o.  female pleasant patient above history of stage I breast cancer ER PR positive HER-2/neu positive on tamoxifen is here for follow-up.  She reports that she is doing well on the Tamoxifen. No chest pains or side effects.  She recently had a mammogram and bone density completed on October 06, 2021.  Her mammogram was benign.  Her recent bone density scan showed a T score of -1.4 which is fairly stable from her last which was -1.3 last year.  Her only concern today is some right sided pain that wraps around to her breast.  This has been present for the past 2.5- 3 weeks.  She denies any injury that caused the pain.   She describes the pain as a sharp stabbing pain that worsens with movements of her right arm.  Most notably when she is trying to rotate in bed this causes her trouble.  Rated 7 out of 10 in nature.  She has tried lidocaine patches which do not seem to help much.  She also has been taking Advil around-the-clock as advised on the bottle which does help some when she takes it.  She denies any loss of sensation, chest pain, rash, shortness of breath and states that symptoms do not worsen with physical activity level such as walking up a flight of stairs.  Review of Systems  Constitutional:  Positive for malaise/fatigue. Negative for chills, diaphoresis, fever and weight loss.  HENT:  Negative for nosebleeds and sore throat.   Eyes:  Negative for double vision.  Respiratory:  Negative for cough, hemoptysis, sputum production, shortness of breath and wheezing.   Cardiovascular:  Negative for chest pain, palpitations, orthopnea and leg swelling.  Gastrointestinal:  Negative for abdominal pain, blood in stool, constipation, diarrhea, heartburn, melena, nausea and vomiting.  Genitourinary:  Negative for dysuria, frequency and urgency.  Musculoskeletal:  Positive for back pain and joint pain.  Skin: Negative.  Negative for itching and rash.  Neurological:  Negative for dizziness, tingling, focal weakness, weakness and headaches.  Endo/Heme/Allergies:  Does not bruise/bleed easily.  Psychiatric/Behavioral:  Negative for depression. The patient is not nervous/anxious and does not have insomnia.  PAST MEDICAL HISTORY :  Past Medical History:  Diagnosis Date   Anemia    B12, d2 and magnesium deficiencies   Anxiety    Arthritis    OSTEOARTHRITIS   Breast cancer of upper-outer quadrant of right female breast ([place]) 06/10/2014   10 mm invasive mammary carcinoma, T1b,Nx; ER 90%, 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 extensive intermediate grade DCIS.    Constipation    DCIS (ductal carcinoma in situ) of breast  06/03/2014   DDD (degenerative disc disease), lumbar    Dyspnea    GERD (gastroesophageal reflux disease)    Heart murmur    History of blood transfusion    Hyperlipidemia    Hypertension    Personal history of chemotherapy 2016   right breast ca   Personal history of radiation therapy 2016   mammosite   SOB (shortness of breath) 12/03/2014    PAST SURGICAL HISTORY :   Past Surgical History:  Procedure Laterality Date   ABDOMINAL HYSTERECTOMY     AXILLARY LYMPH NODE BIOPSY Right 07/14/2014   Procedure: AXILLARY LYMPH NODE BIOPSY;  Surgeon: Robert Bellow, MD;  Location: [facility] ORS;  Service: General;  Laterality: Right;   BACK SURGERY  12/29/2020   BOTOX INJECTION N/A 01/13/2020   Procedure: BOTOX INJECTION;  Surgeon: Hollice Espy, MD;  Location: [facility] ORS;  Service: Urology;  Laterality: N/A;   BREAST BIOPSY Left 2012   benign   BREAST BIOPSY Right 05/26/2014   DCIS    BREAST EXCISIONAL BIOPSY Right 06/28/2017    ULCERATED SKIN WITH UNDERLYING FAT NECROSIS, ACUTE INFLAMMATION AND REACTIVE EPITHELIAL ATYPIA   BREAST LUMPECTOMY Right 06/10/2014   DCIS and Invasive ductal carcinoma, clear margins   BREAST SURGERY Right 06/10/2014   Wide excision for intermediate grade DCIS, identification of a 10  millimeter area of invasive mammary carcinoma.   CHOLECYSTECTOMY  2012   COLONOSCOPY  2009   EYE SURGERY Bilateral 2014   cataract   IR KYPHO EA ADDL LEVEL THORACIC OR LUMBAR  02/12/2021   JOINT REPLACEMENT Left 2001   knee   JOINT REPLACEMENT Right 2003   knee   JOINT REPLACEMENT Left 2004   replacement joint broken   kneee surgery Left    PORT-A-CATH REMOVAL  02/11/2016   Dr Bary Castilla   PORTACATH PLACEMENT Left 08/27/2014   Procedure: INSERTION PORT-A-CATH;  Surgeon: Robert Bellow, MD;  Location: [facility] ORS;  Service: General;  Laterality: Left;    FAMILY HISTORY :   Family History  Problem Relation Age of Onset   Breast cancer Neg Hx     SOCIAL HISTORY:   Social  History   Tobacco Use   Smoking status: Never   Smokeless tobacco: Never  Substance Use Topics   Alcohol use: No    Alcohol/week: 0.0 standard drinks of alcohol   Drug use: No    ALLERGIES:  has No Known Allergies.  MEDICATIONS:  Current Outpatient Medications  Medication Sig Dispense Refill   aspirin EC 81 MG tablet Take 81 mg by mouth daily.     calcium carbonate (OS-CAL) 600 MG TABS tablet Take 600 mg by mouth daily.     cyanocobalamin 1000 MCG tablet Take 1,000 mcg by mouth daily.     cyclobenzaprine (FLEXERIL) 10 MG tablet 1/2-1 po qHS prn     Glucosamine-Chondroit-Vit C-Mn (GLUCOSAMINE 1500 COMPLEX PO) Take 1 tablet by mouth daily.      HYDROcodone-acetaminophen (NORCO) 7.5-325 MG tablet Take 1 tablet by mouth 2 (two) times  daily as needed for pain.  0   lansoprazole (PREVACID) 15 MG capsule Take 15 mg by mouth every morning.      metoprolol succinate (TOPROL-XL) 50 MG 24 hr tablet Take 50 mg by mouth every morning. Take with or immediately following a meal.     Multiple Vitamin (MULTIVITAMIN WITH MINERALS) TABS tablet Take 1 tablet by mouth daily.     predniSONE (DELTASONE) 10 MG tablet Take 4 tablets by mouth with breakfast for 2 days, 2 tablets by mouth for 2 days and 1 tablet by mouth for 2 days. 14 tablet 0   simvastatin (ZOCOR) 40 MG tablet Take 40 mg by mouth at bedtime.      tamoxifen (NOLVADEX) 20 MG tablet TAKE 1 TABLET BY MOUTH  DAILY 90 tablet 3   triamterene-hydrochlorothiazide (MAXZIDE-25) 37.5-25 MG tablet Take 1 tablet by mouth daily.     venlafaxine XR (EFFEXOR-XR) 75 MG 24 hr capsule TAKE 1 CAPSULE BY MOUTH  DAILY WITH BREAKFAST 90 capsule 3   Vitamin D, Ergocalciferol, (DRISDOL) 1.25 MG (50000 UNIT) CAPS capsule TAKE 1 CAPSULE BY MOUTH  EVERY 7 DAYS TAKE ON  MONDAYS 13 capsule 3   fexofenadine (ALLEGRA) 180 MG tablet Take 180 mg by mouth daily. (Patient not taking: Reported on 03/04/2021)     fluticasone (FLONASE) 50 MCG/ACT nasal spray Place 1 spray into both  nostrils daily. (Patient not taking: Reported on 10/08/2021) 16 g 2   hydrochlorothiazide (HYDRODIURIL) 25 MG tablet Take 25 mg by mouth daily.  (Patient not taking: Reported on 10/08/2021)     No current facility-administered medications for this visit.   Facility-Administered Medications Ordered in Other Visits  Medication Dose Route Frequency Provider Last Rate Last Admin   sodium chloride 0.9 % injection 10 mL  10 mL Intravenous PRN Choksi, Delorise Shiner, MD   10 mL at 01/27/15 1416   sodium chloride flush (NS) 0.9 % injection 10 mL  10 mL Intravenous PRN Cammie Sickle, MD   10 mL at 12/10/15 1347    PHYSICAL EXAMINATION: ECOG PERFORMANCE STATUS: 0 - Asymptomatic  BP 115/71   Pulse 65   Temp 98.5 F (36.9 C)   Resp 18   Wt 276 lb 6.4 oz (125.4 kg)   SpO2 98%   BMI 50.55 kg/m   Filed Weights   10/08/21 1313  Weight: 276 lb 6.4 oz (125.4 kg)   Physical Exam Constitutional:      Comments: Obese. Alone.   HENT:     Head: Normocephalic and atraumatic.     Mouth/Throat:     Pharynx: No oropharyngeal exudate.  Eyes:     Pupils: Pupils are equal, round, and reactive to light.  Cardiovascular:     Rate and Rhythm: Normal rate and regular rhythm.  Pulmonary:     Effort: No respiratory distress.     Breath sounds: Normal breath sounds. No wheezing.  Abdominal:     General: Bowel sounds are normal. There is no distension.     Palpations: Abdomen is soft. There is no mass.     Tenderness: There is no abdominal tenderness. There is no guarding or rebound.  Musculoskeletal:        General: No swelling or tenderness. Normal range of motion.     Cervical back: Normal range of motion and neck supple.     Comments: No midline tenderness to palpation of spine.  There is reproducible tenderness of the right upper back and shoulder area with palpable muscle tension  and knotting.  Range of motion of the neck, back and right arm appear intact.  Normal arc of motion which does elicit some  pain.  Normal Apley scratch testing.  Normal empty can.  Strength 5 out of 5 bilaterally.  Skin:    General: Skin is warm.     Findings: No rash.  Neurological:     Mental Status: She is alert and oriented to person, place, and time.  Psychiatric:        Mood and Affect: Affect normal.        LABORATORY DATA:  I have reviewed the data as listed    Component Value Date/Time   NA 141 10/08/2021 1059   NA 141 06/05/2014 0843   K 3.9 10/08/2021 1059   K 3.5 06/05/2014 0843   CL 102 10/08/2021 1059   CL 105 06/05/2014 0843   CO2 27 10/08/2021 1059   CO2 27 06/05/2014 0843   GLUCOSE 96 10/08/2021 1059   GLUCOSE 94 06/05/2014 0843   BUN 23 10/08/2021 1059   BUN 22 (H) 06/05/2014 0843   CREATININE 1.40 (H) 10/08/2021 1059   CREATININE 1.15 (H) 02/05/2021 1018   CALCIUM 9.1 10/08/2021 1059   CALCIUM 9.2 06/05/2014 0843   PROT 6.8 10/08/2021 1059   ALBUMIN 3.7 10/08/2021 1059   AST 29 10/08/2021 1059   ALT 20 10/08/2021 1059   ALKPHOS 54 10/08/2021 1059   BILITOT 1.1 10/08/2021 1059   GFRNONAA 38 (L) 10/08/2021 1059   GFRNONAA 58 (L) 06/05/2014 0843   GFRAA 44 (L) 08/05/2019 1315   GFRAA >60 06/05/2014 0843    No results found for: "SPEP", "UPEP"  Lab Results  Component Value Date   WBC 12.6 (H) 10/08/2021   NEUTROABS 6.6 10/08/2021   HGB 14.8 10/08/2021   HCT 43.9 10/08/2021   MCV 94.6 10/08/2021   PLT 185 10/08/2021      Chemistry      Component Value Date/Time   NA 141 10/08/2021 1059   NA 141 06/05/2014 0843   K 3.9 10/08/2021 1059   K 3.5 06/05/2014 0843   CL 102 10/08/2021 1059   CL 105 06/05/2014 0843   CO2 27 10/08/2021 1059   CO2 27 06/05/2014 0843   BUN 23 10/08/2021 1059   BUN 22 (H) 06/05/2014 0843   CREATININE 1.40 (H) 10/08/2021 1059   CREATININE 1.15 (H) 02/05/2021 1018      Component Value Date/Time   CALCIUM 9.1 10/08/2021 1059   CALCIUM 9.2 06/05/2014 0843   ALKPHOS 54 10/08/2021 1059   AST 29 10/08/2021 1059   ALT 20 10/08/2021  1059   BILITOT 1.1 10/08/2021 1059       RADIOGRAPHIC STUDIES: I have personally reviewed the radiological images as listed and agreed with the findings in the report. No results found.   ASSESSMENT & PLAN:  Encounter Diagnoses  Name Primary?   Carcinoma of overlapping sites of right breast in female, estrogen receptor positive ([place]) Yes   Muscle pain    Chronic in nature with recent benign mammogram.  She will continue her calcium and vitamin D.  I have encouraged her to complete weightbearing exercise as tolerated as this will also help with her T score.  Recommend mammogram and DEXA in 1 year.  Continue tamoxifen at this time with a plan cessation in 2027 per Dr. Alpha Gula last note.  New to me.  She does not have a rash or typical signs and symptoms of shingles.  I also  discussed with patient that this far out her options would be more limited to gabapentin should this have been shingles.  The fact that her symptoms are reproducible with movement of the shoulder itself indicate that this is likely musculoskeletal concern and significantly lowers the risk of this being cardiovascular in nature.  We did discuss the risks of tamoxifen in the postmenopausal state and discussed red flag signs and symptoms.  At this time I would traditionally offer her NSAIDs versus muscle relaxer however given her slight bump in creatinine level from taking the Advil multiple times a day for the past 2 to 3 weeks I have recommended that she stop this medication, increase her hydration with water and switch to Tylenol as directed on the bottle.  She is already on Flexeril muscle relaxer.  I would not recommend going any higher in dosage given her age and increased risk for negative side effects.  She has already tried lidocaine patch.  We discussed risks and benefits of prednisone which she has tolerated well in the past.  I will send in a low-dose of prednisone to help with her symptoms and I recommended  orthopedic follow-up.   I spent 40 minutes dedicated to the care of this patient (face to face and non-face to face) on the date of this encounter to include what is included in my plan above.   PLAN:  RTC 1 year with MD, labs (CBC, BMP), mammogram and DEXA beforehand.   No orders of the defined types were placed in this encounter.  All questions were answered. The patient knows to call the clinic with any problems, questions or concerns.      Hughie Closs, PA-C 10/08/2021 4:03 PM

## 2022-03-28 ENCOUNTER — Other Ambulatory Visit: Payer: Self-pay | Admitting: Internal Medicine

## 2022-04-17 ENCOUNTER — Other Ambulatory Visit: Payer: Self-pay | Admitting: Internal Medicine

## 2022-05-15 ENCOUNTER — Other Ambulatory Visit: Payer: Self-pay | Admitting: Internal Medicine

## 2022-06-07 ENCOUNTER — Inpatient Hospital Stay
Admission: EM | Admit: 2022-06-07 | Discharge: 2022-06-10 | DRG: 872 | Disposition: A | Discharge status: Home / Self Care | Payer: Medicare Other | Attending: Internal Medicine | Admitting: Internal Medicine

## 2022-06-07 ENCOUNTER — Other Ambulatory Visit: Payer: Self-pay

## 2022-06-07 ENCOUNTER — Emergency Department: Payer: Medicare Other

## 2022-06-07 ENCOUNTER — Encounter: Payer: Self-pay | Admitting: Internal Medicine

## 2022-06-07 DIAGNOSIS — Z853 Personal history of malignant neoplasm of breast: Secondary | ICD-10-CM

## 2022-06-07 DIAGNOSIS — I2510 Atherosclerotic heart disease of native coronary artery without angina pectoris: Secondary | ICD-10-CM | POA: Diagnosis present

## 2022-06-07 DIAGNOSIS — K529 Noninfective gastroenteritis and colitis, unspecified: Secondary | ICD-10-CM | POA: Diagnosis present

## 2022-06-07 DIAGNOSIS — M109 Gout, unspecified: Secondary | ICD-10-CM | POA: Diagnosis present

## 2022-06-07 DIAGNOSIS — R6520 Severe sepsis without septic shock: Secondary | ICD-10-CM | POA: Diagnosis present

## 2022-06-07 DIAGNOSIS — Z9221 Personal history of antineoplastic chemotherapy: Secondary | ICD-10-CM

## 2022-06-07 DIAGNOSIS — Z96653 Presence of artificial knee joint, bilateral: Secondary | ICD-10-CM | POA: Diagnosis present

## 2022-06-07 DIAGNOSIS — Z79899 Other long term (current) drug therapy: Secondary | ICD-10-CM | POA: Diagnosis not present

## 2022-06-07 DIAGNOSIS — C50411 Malignant neoplasm of upper-outer quadrant of right female breast: Secondary | ICD-10-CM | POA: Diagnosis present

## 2022-06-07 DIAGNOSIS — F418 Other specified anxiety disorders: Secondary | ICD-10-CM | POA: Diagnosis present

## 2022-06-07 DIAGNOSIS — K219 Gastro-esophageal reflux disease without esophagitis: Secondary | ICD-10-CM | POA: Diagnosis present

## 2022-06-07 DIAGNOSIS — Z8249 Family history of ischemic heart disease and other diseases of the circulatory system: Secondary | ICD-10-CM

## 2022-06-07 DIAGNOSIS — Z923 Personal history of irradiation: Secondary | ICD-10-CM | POA: Diagnosis not present

## 2022-06-07 DIAGNOSIS — E66813 Obesity, class 3: Secondary | ICD-10-CM | POA: Diagnosis present

## 2022-06-07 DIAGNOSIS — F419 Anxiety disorder, unspecified: Secondary | ICD-10-CM | POA: Diagnosis present

## 2022-06-07 DIAGNOSIS — I10 Essential (primary) hypertension: Secondary | ICD-10-CM | POA: Diagnosis not present

## 2022-06-07 DIAGNOSIS — I700 Atherosclerosis of aorta: Secondary | ICD-10-CM | POA: Diagnosis present

## 2022-06-07 DIAGNOSIS — N1832 Chronic kidney disease, stage 3b: Secondary | ICD-10-CM | POA: Diagnosis present

## 2022-06-07 DIAGNOSIS — I129 Hypertensive chronic kidney disease with stage 1 through stage 4 chronic kidney disease, or unspecified chronic kidney disease: Secondary | ICD-10-CM | POA: Diagnosis present

## 2022-06-07 DIAGNOSIS — F32A Depression, unspecified: Secondary | ICD-10-CM | POA: Diagnosis present

## 2022-06-07 DIAGNOSIS — E876 Hypokalemia: Secondary | ICD-10-CM | POA: Diagnosis not present

## 2022-06-07 DIAGNOSIS — E785 Hyperlipidemia, unspecified: Secondary | ICD-10-CM | POA: Diagnosis not present

## 2022-06-07 DIAGNOSIS — Z6841 Body Mass Index (BMI) 40.0 and over, adult: Secondary | ICD-10-CM

## 2022-06-07 DIAGNOSIS — Z7982 Long term (current) use of aspirin: Secondary | ICD-10-CM

## 2022-06-07 DIAGNOSIS — A419 Sepsis, unspecified organism: Secondary | ICD-10-CM | POA: Diagnosis not present

## 2022-06-07 LAB — URINALYSIS, ROUTINE W REFLEX MICROSCOPIC
Bilirubin Urine: NEGATIVE
Glucose, UA: NEGATIVE mg/dL
Hgb urine dipstick: NEGATIVE
Ketones, ur: NEGATIVE mg/dL
Leukocytes,Ua: NEGATIVE
Nitrite: NEGATIVE
Protein, ur: NEGATIVE mg/dL
Specific Gravity, Urine: 1.017 (ref 1.005–1.030)
pH: 6 (ref 5.0–8.0)

## 2022-06-07 LAB — CBC WITH DIFFERENTIAL/PLATELET
Abs Immature Granulocytes: 0.11 10*3/uL — ABNORMAL HIGH (ref 0.00–0.07)
Basophils Absolute: 0.1 10*3/uL (ref 0.0–0.1)
Basophils Relative: 0 %
Eosinophils Absolute: 0.1 10*3/uL (ref 0.0–0.5)
Eosinophils Relative: 1 %
HCT: 46.9 % — ABNORMAL HIGH (ref 36.0–46.0)
Hemoglobin: 15.8 g/dL — ABNORMAL HIGH (ref 12.0–15.0)
Immature Granulocytes: 1 %
Lymphocytes Relative: 18 %
Lymphs Abs: 3.9 10*3/uL (ref 0.7–4.0)
MCH: 32 pg (ref 26.0–34.0)
MCHC: 33.7 g/dL (ref 30.0–36.0)
MCV: 94.9 fL (ref 80.0–100.0)
Monocytes Absolute: 1.3 10*3/uL — ABNORMAL HIGH (ref 0.1–1.0)
Monocytes Relative: 6 %
Neutro Abs: 16.6 10*3/uL — ABNORMAL HIGH (ref 1.7–7.7)
Neutrophils Relative %: 74 %
Platelets: 196 10*3/uL (ref 150–400)
RBC: 4.94 MIL/uL (ref 3.87–5.11)
RDW: 13.1 % (ref 11.5–15.5)
WBC: 22.1 10*3/uL — ABNORMAL HIGH (ref 4.0–10.5)
nRBC: 0 % (ref 0.0–0.2)

## 2022-06-07 LAB — PROCALCITONIN: Procalcitonin: <0.1 ng/mL

## 2022-06-07 LAB — PROTIME-INR
INR: 1.1 (ref 0.8–1.2)
Prothrombin Time: 14.2 seconds (ref 11.4–15.2)

## 2022-06-07 LAB — COMPREHENSIVE METABOLIC PANEL
ALT: 17 U/L (ref 0–44)
AST: 34 U/L (ref 15–41)
Albumin: 3.6 g/dL (ref 3.5–5.0)
Alkaline Phosphatase: 56 U/L (ref 38–126)
Anion gap: 11 (ref 5–15)
BUN: 21 mg/dL (ref 8–23)
CO2: 21 mmol/L — ABNORMAL LOW (ref 22–32)
Calcium: 8.8 mg/dL — ABNORMAL LOW (ref 8.9–10.3)
Chloride: 103 mmol/L (ref 98–111)
Creatinine, Ser: 1.45 mg/dL — ABNORMAL HIGH (ref 0.44–1.00)
GFR, Estimated: 36 mL/min — ABNORMAL LOW (ref >60)
Glucose, Bld: 174 mg/dL — ABNORMAL HIGH (ref 70–99)
Potassium: 3.1 mmol/L — ABNORMAL LOW (ref 3.5–5.1)
Sodium: 135 mmol/L (ref 135–145)
Total Bilirubin: 2.3 mg/dL — ABNORMAL HIGH (ref 0.3–1.2)
Total Protein: 6.9 g/dL (ref 6.5–8.1)

## 2022-06-07 LAB — BILIRUBIN, FRACTIONATED(TOT/DIR/INDIR)
Bilirubin, Direct: 0.3 mg/dL — ABNORMAL HIGH (ref 0.0–0.2)
Indirect Bilirubin: 1.3 mg/dL — ABNORMAL HIGH (ref 0.3–0.9)
Total Bilirubin: 1.6 mg/dL — ABNORMAL HIGH (ref 0.3–1.2)

## 2022-06-07 LAB — LACTIC ACID, PLASMA
Lactic Acid, Venous: 3 mmol/L (ref 0.5–1.9)
Lactic Acid, Venous: 3.2 mmol/L (ref 0.5–1.9)
Lactic Acid, Venous: 2.8 mmol/L (ref 0.5–1.9)
Lactic Acid, Venous: 1.6 mmol/L (ref 0.5–1.9)
Lactic Acid, Venous: 2.2 mmol/L (ref 0.5–1.9)

## 2022-06-07 LAB — C-REACTIVE PROTEIN: CRP: 3 mg/dL — ABNORMAL HIGH (ref <1.0)

## 2022-06-07 LAB — MAGNESIUM: Magnesium: 1.7 mg/dL (ref 1.7–2.4)

## 2022-06-07 LAB — BRAIN NATRIURETIC PEPTIDE: B Natriuretic Peptide: 50.2 pg/mL (ref 0.0–100.0)

## 2022-06-07 LAB — SEDIMENTATION RATE: Sed Rate: 7 mm/hr (ref 0–30)

## 2022-06-07 MED ORDER — MORPHINE SULFATE (PF) 2 MG/ML IV SOLN
1.0000 mg | INTRAVENOUS | Status: DC | PRN
  Administered 2022-06-07 – 2022-06-09 (×3): 1 mg via INTRAVENOUS
  Filled 2022-06-07 (×3): qty 1

## 2022-06-07 MED ORDER — SIMVASTATIN 20 MG PO TABS
40.0000 mg | ORAL_TABLET | Freq: Every day | ORAL | Status: DC
  Administered 2022-06-08 – 2022-06-09 (×2): 40 mg via ORAL
  Filled 2022-06-07 (×2): qty 2

## 2022-06-07 MED ORDER — PANTOPRAZOLE SODIUM 20 MG PO TBEC
20.0000 mg | DELAYED_RELEASE_TABLET | Freq: Every day | ORAL | Status: DC
  Administered 2022-06-08 – 2022-06-10 (×3): 20 mg via ORAL
  Filled 2022-06-07 (×3): qty 1

## 2022-06-07 MED ORDER — SODIUM CHLORIDE 0.9 % IV SOLN: INTRAVENOUS | Status: DC

## 2022-06-07 MED ORDER — SODIUM CHLORIDE 0.9 % IV SOLN: Freq: Once | INTRAVENOUS | Status: AC

## 2022-06-07 MED ORDER — HEPARIN SODIUM (PORCINE) 5000 UNIT/ML IJ SOLN
5000.0000 [IU] | Freq: Three times a day (TID) | INTRAMUSCULAR | Status: DC
  Administered 2022-06-07 – 2022-06-10 (×9): 5000 [IU] via SUBCUTANEOUS
  Filled 2022-06-07 (×9): qty 1

## 2022-06-07 MED ORDER — METRONIDAZOLE 500 MG/100ML IV SOLN
500.0000 mg | Freq: Once | INTRAVENOUS | Status: AC
  Administered 2022-06-07: 500 mg via INTRAVENOUS
  Filled 2022-06-07: qty 100

## 2022-06-07 MED ORDER — VANCOMYCIN HCL IN DEXTROSE 1-5 GM/200ML-% IV SOLN
1000.0000 mg | Freq: Once | INTRAVENOUS | Status: DC
  Filled 2022-06-07: qty 200

## 2022-06-07 MED ORDER — MORPHINE SULFATE (PF) 2 MG/ML IV SOLN
2.0000 mg | Freq: Once | INTRAVENOUS | Status: AC
  Administered 2022-06-07: 2 mg via INTRAVENOUS
  Filled 2022-06-07: qty 1

## 2022-06-07 MED ORDER — ACETAMINOPHEN 325 MG PO TABS
650.0000 mg | ORAL_TABLET | Freq: Four times a day (QID) | ORAL | Status: DC | PRN
  Administered 2022-06-08: 650 mg via ORAL
  Filled 2022-06-07: qty 2

## 2022-06-07 MED ORDER — VENLAFAXINE HCL ER 75 MG PO CP24
75.0000 mg | ORAL_CAPSULE | Freq: Every day | ORAL | Status: DC
  Administered 2022-06-08 – 2022-06-10 (×3): 75 mg via ORAL
  Filled 2022-06-07 (×3): qty 1

## 2022-06-07 MED ORDER — METOPROLOL SUCCINATE ER 50 MG PO TB24
50.0000 mg | ORAL_TABLET | ORAL | Status: DC
  Administered 2022-06-08 – 2022-06-10 (×3): 50 mg via ORAL
  Filled 2022-06-07 (×3): qty 1

## 2022-06-07 MED ORDER — TAMOXIFEN CITRATE 10 MG PO TABS
20.0000 mg | ORAL_TABLET | Freq: Every day | ORAL | Status: DC
  Administered 2022-06-07 – 2022-06-10 (×4): 20 mg via ORAL
  Filled 2022-06-07 (×4): qty 2

## 2022-06-07 MED ORDER — SODIUM CHLORIDE 0.9 % IV BOLUS
500.0000 mL | Freq: Once | INTRAVENOUS | Status: AC
  Administered 2022-06-07: 500 mL via INTRAVENOUS

## 2022-06-07 MED ORDER — MECLIZINE HCL 25 MG PO TABS: 25.0000 mg | ORAL_TABLET | Freq: Three times a day (TID) | ORAL | Status: DC | PRN

## 2022-06-07 MED ORDER — ONDANSETRON HCL 4 MG/2ML IJ SOLN: 4.0000 mg | Freq: Three times a day (TID) | INTRAMUSCULAR | Status: DC | PRN

## 2022-06-07 MED ORDER — OXYCODONE-ACETAMINOPHEN 5-325 MG PO TABS: 1.0000 | ORAL_TABLET | ORAL | Status: DC | PRN

## 2022-06-07 MED ORDER — PIPERACILLIN-TAZOBACTAM 3.375 G IVPB
3.3750 g | Freq: Three times a day (TID) | INTRAVENOUS | Status: DC
  Administered 2022-06-07 – 2022-06-10 (×8): 3.375 g via INTRAVENOUS
  Filled 2022-06-07 (×8): qty 50

## 2022-06-07 MED ORDER — SODIUM CHLORIDE 0.9 % IV SOLN
2.0000 g | Freq: Once | INTRAVENOUS | Status: AC
  Administered 2022-06-07: 2 g via INTRAVENOUS
  Filled 2022-06-07: qty 12.5

## 2022-06-07 MED ORDER — ADULT MULTIVITAMIN W/MINERALS CH
1.0000 | ORAL_TABLET | Freq: Every day | ORAL | Status: DC
  Administered 2022-06-08 – 2022-06-10 (×3): 1 via ORAL
  Filled 2022-06-07 (×3): qty 1

## 2022-06-07 MED ORDER — ONDANSETRON HCL 4 MG/2ML IJ SOLN
4.0000 mg | Freq: Once | INTRAMUSCULAR | Status: AC
  Administered 2022-06-07: 4 mg via INTRAVENOUS
  Filled 2022-06-07: qty 2

## 2022-06-07 MED ORDER — LACTATED RINGERS IV SOLN: INTRAVENOUS | Status: DC

## 2022-06-07 MED ORDER — IOHEXOL 300 MG/ML  SOLN
80.0000 mL | Freq: Once | INTRAMUSCULAR | Status: AC | PRN
  Administered 2022-06-07: 80 mL via INTRAVENOUS

## 2022-06-07 MED ORDER — ASPIRIN 81 MG PO TBEC
81.0000 mg | DELAYED_RELEASE_TABLET | Freq: Every day | ORAL | Status: DC
  Administered 2022-06-08 – 2022-06-10 (×3): 81 mg via ORAL
  Filled 2022-06-07 (×3): qty 1

## 2022-06-07 MED ORDER — POTASSIUM CHLORIDE CRYS ER 20 MEQ PO TBCR
60.0000 meq | EXTENDED_RELEASE_TABLET | Freq: Once | ORAL | Status: AC
  Administered 2022-06-07: 60 meq via ORAL
  Filled 2022-06-07: qty 3

## 2022-06-07 MED ORDER — HYDRALAZINE HCL 20 MG/ML IJ SOLN: 5.0000 mg | INTRAMUSCULAR | Status: DC | PRN

## 2022-06-07 MED ORDER — CALCIUM CARBONATE 1250 (500 CA) MG PO TABS
1.0000 | ORAL_TABLET | Freq: Every day | ORAL | Status: DC
  Administered 2022-06-08 – 2022-06-10 (×3): 1250 mg via ORAL
  Filled 2022-06-07 (×3): qty 1

## 2022-06-07 NOTE — ED Triage Notes (Signed)
Pt states that she just completed a 5 day course of medication for gout and has been having abd and gastric distress since, pt is reporting not feeling well for awhile, and having llq pain.

## 2022-06-07 NOTE — Progress Notes (Addendum)
Pharmacy Antibiotic Note  Maria Gutierrez is a 82 y.o. female admitted on 06/07/2022 with sepsis - suspected source acute colitis. PMH includes HTN, HLD, depression, breast cancer (s/p radiation and chemo 2016), CKD3B. Pharmacy has been consulted for Zosyn dosing.  Plan: Zosyn 3.375 grams every 8 hours (4 hour infusion)  Height: 5\' 2"  (157.5 cm) Weight: 113.4 kg (250 lb) IBW/kg (Calculated) : 50.1  Temp (24hrs), Avg:98.8 F (37.1 C), Min:98.1 F (36.7 C), Max:99.5 F (37.5 C)  Recent Labs  Lab 06/07/22 0852 06/07/22 0945  WBC 22.1*  --   CREATININE 1.45*  --   LATICACIDVEN 3.0* 3.2*    Estimated Creatinine Clearance: 36.8 mL/min (A) (by C-G formula based on SCr of 1.45 mg/dL (H)).    No Known Allergies  Antimicrobials this admission: Cefepime x 1 metronidazole x1  Zosyn 4/9 >>  Dose adjustments this admission: N/a  Microbiology results: 4/9 BCx: in process   Thank you for allowing pharmacy to be a part of this patient's care.  Elliot Gurney, PharmD, BCPS Clinical Pharmacist  06/07/2022 11:32 AM

## 2022-06-07 NOTE — ED Notes (Signed)
Pt taken to xray 

## 2022-06-07 NOTE — ED Provider Notes (Signed)
[facility] Provider Note    Event Date/Time   First MD Initiated Contact with Patient 06/07/22 1104     (approximate)   History   Abdominal Pain   HPI  Maria Gutierrez is a 82 y.o. female with history of hypertension, anxiety, anemia, GERD who presents with complaints of abdominal discomfort and bloating sensation over the last 3 days which has become moderate to severe.  She reports he was on prednisone last week but stopped on Wednesday.  This was for a different condition.  Some nausea but no dysuria     Physical Exam   Triage Vital Signs: ED Triage Vitals  Enc Vitals Group     BP 06/07/22 0851 95/76     Pulse Rate 06/07/22 0851 (!) 130     Resp 06/07/22 0851 18     Temp 06/07/22 0851 99.5 F (37.5 C)     Temp Source 06/07/22 0851 Oral     SpO2 06/07/22 0851 95 %     Weight 06/07/22 0852 113.4 kg (250 lb)     Height 06/07/22 0852 1.575 m (5\' 2" )     Head Circumference --      Peak Flow --      Pain Score 06/07/22 0852 7     Pain Loc --      Pain Edu? --      Excl. in GC? --     Most recent vital signs: Vitals:   06/07/22 1007 06/07/22 1023  BP:  115/82  Pulse:  (!) 107  Resp:  16  Temp:  98.1 F (36.7 C)  SpO2: 92% 97%     General: Awake, no distress.  CV:  Good peripheral perfusion.  Tachycardia Resp:  Normal effort.  Abd:  Positive distention, mild diffuse tenderness Other:     ED Results / Procedures / Treatments   Labs (all labs ordered are listed, but only abnormal results are displayed) Labs Reviewed  COMPREHENSIVE METABOLIC PANEL - Abnormal; Notable for the following components:      Result Value   Potassium 3.1 (*)    CO2 21 (*)    Glucose, Bld 174 (*)    Creatinine, Ser 1.45 (*)    Calcium 8.8 (*)    Total Bilirubin 2.3 (*)    GFR, Estimated 36 (*)    All other components within normal limits  LACTIC ACID, PLASMA - Abnormal; Notable for the following components:   Lactic Acid, Venous 3.0 (*)    All other  components within normal limits  LACTIC ACID, PLASMA - Abnormal; Notable for the following components:   Lactic Acid, Venous 3.2 (*)    All other components within normal limits  CBC WITH DIFFERENTIAL/PLATELET - Abnormal; Notable for the following components:   WBC 22.1 (*)    Hemoglobin 15.8 (*)    HCT 46.9 (*)    Neutro Abs 16.6 (*)    Monocytes Absolute 1.3 (*)    Abs Immature Granulocytes 0.11 (*)    All other components within normal limits  URINALYSIS, ROUTINE W REFLEX MICROSCOPIC - Abnormal; Notable for the following components:   Color, Urine YELLOW (*)    APPearance HAZY (*)    All other components within normal limits  CULTURE, BLOOD (ROUTINE X 2)  CULTURE, BLOOD (ROUTINE X 2)  MAGNESIUM  PROTIME-INR  BRAIN NATRIURETIC PEPTIDE  LACTIC ACID, PLASMA  LACTIC ACID, PLASMA  LACTIC ACID, PLASMA  PROCALCITONIN     EKG  RADIOLOGY CT scan most consistent with colitis  Chest x-ray viewed interpreted by me, no pneumonia  PROCEDURES:  Critical Care performed: yes  CRITICAL CARE Performed by: Jene Every   Total critical care time: 30 minutes  Critical care time was exclusive of separately billable procedures and treating other patients.  Critical care was necessary to treat or prevent imminent or life-threatening deterioration.  Critical care was time spent personally by me on the following activities: development of treatment plan with patient and/or surrogate as well as nursing, discussions with consultants, evaluation of patient's response to treatment, examination of patient, obtaining history from patient or surrogate, ordering and performing treatments and interventions, ordering and review of laboratory studies, ordering and review of radiographic studies, pulse oximetry and re-evaluation of patient's condition.   Procedures   MEDICATIONS ORDERED IN ED: Medications  lactated ringers infusion ( Intravenous New Bag/Given 06/07/22 1104)  metroNIDAZOLE  (FLAGYL) IVPB 500 mg (500 mg Intravenous New Bag/Given 06/07/22 1103)  0.9 %  sodium chloride infusion (has no administration in time range)  sodium chloride 0.9 % bolus 500 mL (has no administration in time range)  potassium chloride SA (KLOR-CON M) CR tablet 60 mEq (has no administration in time range)  ondansetron (ZOFRAN) injection 4 mg (has no administration in time range)  hydrALAZINE (APRESOLINE) injection 5 mg (has no administration in time range)  acetaminophen (TYLENOL) tablet 650 mg (has no administration in time range)  morphine (PF) 2 MG/ML injection 1 mg (has no administration in time range)  oxyCODONE-acetaminophen (PERCOCET/ROXICET) 5-325 MG per tablet 1 tablet (has no administration in time range)  heparin injection 5,000 Units (has no administration in time range)  piperacillin-tazobactam (ZOSYN) IVPB 3.375 g (has no administration in time range)  0.9 %  sodium chloride infusion (0 mLs Intravenous Stopped 06/07/22 1103)  morphine (PF) 2 MG/ML injection 2 mg (2 mg Intravenous Given 06/07/22 0958)  ondansetron (ZOFRAN) injection 4 mg (4 mg Intravenous Given 06/07/22 0959)  iohexol (OMNIPAQUE) 300 MG/ML solution 80 mL (80 mLs Intravenous Contrast Given 06/07/22 1009)  ceFEPIme (MAXIPIME) 2 g in sodium chloride 0.9 % 100 mL IVPB (0 g Intravenous Stopped 06/07/22 1103)     IMPRESSION / MDM / ASSESSMENT AND PLAN / ED COURSE  I reviewed the triage vital signs and the nursing notes. Patient's presentation is most consistent with acute presentation with potential threat to life or bodily function.   Patient presents with abdominal pain as detailed above, she is tachycardic, blood pressure 95/76, not hypotensive but she does feel weak and fatigued  He has abdominal tenderness and given temperature of 99.5 with tachycardia, concern for sepsis, blood cultures, lactic sent, IV morphine, IV Zofran, IV fluids  CT scan obtained which is consistent with colitis, broad-spectrum antibiotics  initiated  Patient's heart rate is improved, blood pressure has improved as well  I have discussed with the hospitalist for admission       FINAL CLINICAL IMPRESSION(S) / ED DIAGNOSES   Final diagnoses:  Colitis  Sepsis, due to unspecified organism, unspecified whether acute organ dysfunction present     Rx / DC Orders   ED Discharge Orders     None        Note:  This document was prepared using Dragon voice recognition software and may include unintentional dictation errors.   Jene Every, MD 06/07/22 1141

## 2022-06-07 NOTE — H&P (Signed)
History and Physical    Maria Gutierrez:[id number] DOB: 1940-03-05 DOA: 06/07/2022  Referring MD/NP/PA:   PCP: Jerl Mina, MD   Patient coming from:  The patient is coming from home.     Chief Complaint: Abdominal pain  HPI: Maria Gutierrez is a 82 y.o. female with medical history significant of hypertension, hyperlipidemia, depression with anxiety, CKD-3B, breast cancer (s/p of radiation and chemotherapy 2016), right foot gout, who presents with abdominal pain.  Patient states that she has abdominal pain for more than 3 days, which is mainly located in the right side of abdomen, sharp, 9 out of 10 in severity, radiating to the back, associated with nausea, burping, dry heaves.  No diarrhea.  Patient has low-grade fever and chills at home.  Patient does not have chest pain, cough, shortness breath.  No symptoms of UTI.  Patient states that she just finished a course of prednisone treatment for right foot gout.  Her right foot pain has significantly improved.  Data reviewed independently and ED Course: pt was found to have WBC 21.1, stable renal function, potassium 3.1, temperature 99.5, blood pressure 95/76, which improved to 115/82 after giving 1 L normal saline bolus in the ED, heart rate 130 --> 107, RR 18, oxygen saturation 95% on room air.  Chest x-ray showed cardiomegaly and vascular congestion.  CT of abdomen/pelvis showed possible colitis.  Patient is admitted to telemetry bed as inpatient.  Dr. Timothy Lasso of GI is consulted.  CT-abd/pelvis: 1. Segmental mural thickening of the ascending and mid to distal transverse colon, likely infectious/inflammatory colitis. Recommend correlation with colonoscopy once acute symptoms have resolved if one has not been performed recently. 2. Aortic Atherosclerosis (ICD10-I70.0). Coronary artery calcifications. Assessment for potential risk factor modification, dietary therapy or pharmacologic therapy may be warranted, if clinically  indicated.  EKG:  Not done in ED, will get one.    Review of Systems:   General: no fevers, chills, no body weight gain, has fatigue HEENT: no blurry vision, hearing changes or sore throat Respiratory: no dyspnea, coughing, wheezing CV: no chest pain, no palpitations GI: has nausea, abdominal pain, no diarrhea, constipation GU: no dysuria, burning on urination, increased urinary frequency, hematuria  Ext: no leg edema Neuro: no unilateral weakness, numbness, or tingling, no vision change or hearing loss Skin: no rash, no skin tear. MSK: No muscle spasm, no deformity, no limitation of range of movement in spin Heme: No easy bruising.  Travel history: No recent long distant travel.   Allergy: No Known Allergies  Past Medical History:  Diagnosis Date   Anemia    B12, d2 and magnesium deficiencies   Anxiety    Arthritis    OSTEOARTHRITIS   Breast cancer of upper-outer quadrant of right female breast 06/10/2014   10 mm invasive mammary carcinoma, T1b,Nx; ER 90%, 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 extensive intermediate grade DCIS.    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   DDD (degenerative disc disease), lumbar    Dyspnea    GERD (gastroesophageal reflux disease)    Heart murmur    History of blood transfusion    Hyperlipidemia    Hypertension    Personal history of chemotherapy 2016   right breast ca   Personal history of radiation therapy 2016   mammosite   SOB (shortness of breath) 12/03/2014    Past Surgical History:  Procedure Laterality Date   ABDOMINAL HYSTERECTOMY     AXILLARY LYMPH NODE BIOPSY  Right 07/14/2014   Procedure: AXILLARY LYMPH NODE BIOPSY;  Surgeon: Earline Mayotte, MD;  Location: [facility] ORS;  Service: General;  Laterality: Right;   BACK SURGERY  12/29/2020   BOTOX INJECTION N/A 01/13/2020   Procedure: BOTOX INJECTION;  Surgeon: Vanna Scotland, MD;  Location: [facility] ORS;  Service: Urology;  Laterality: N/A;   BREAST BIOPSY Left  2012   benign   BREAST BIOPSY Right 05/26/2014   DCIS    BREAST EXCISIONAL BIOPSY Right 06/28/2017    ULCERATED SKIN WITH UNDERLYING FAT NECROSIS, ACUTE INFLAMMATION AND REACTIVE EPITHELIAL ATYPIA   BREAST LUMPECTOMY Right 06/10/2014   DCIS and Invasive ductal carcinoma, clear margins   BREAST SURGERY Right 06/10/2014   Wide excision for intermediate grade DCIS, identification of a 10  millimeter area of invasive mammary carcinoma.   CHOLECYSTECTOMY  2012   COLONOSCOPY  2009   EYE SURGERY Bilateral 2014   cataract   IR KYPHO EA ADDL LEVEL THORACIC OR LUMBAR  02/12/2021   JOINT REPLACEMENT Left 2001   knee   JOINT REPLACEMENT Right 2003   knee   JOINT REPLACEMENT Left 2004   replacement joint broken   kneee surgery Left    PORT-A-CATH REMOVAL  02/11/2016   Dr Lemar Livings   PORTACATH PLACEMENT Left 08/27/2014   Procedure: INSERTION PORT-A-CATH;  Surgeon: Earline Mayotte, MD;  Location: [facility] ORS;  Service: General;  Laterality: Left;    Social History:  reports that she has never smoked. She has never used smokeless tobacco. She reports that she does not drink alcohol and does not use drugs.  Family History:  Family History  Problem Relation Age of Onset   Heart disease Sister    Breast cancer Neg Hx      Prior to Admission medications   Medication Sig Start Date End Date Taking? Authorizing Provider  aspirin EC 81 MG tablet Take 81 mg by mouth daily.    [provider]  calcium carbonate (OS-CAL) 600 MG TABS tablet Take 600 mg by mouth daily.    [provider]  cyanocobalamin 1000 MCG tablet Take 1,000 mcg by mouth daily.    [provider]  cyclobenzaprine (FLEXERIL) 10 MG tablet 1/2-1 po qHS prn 01/03/17   [provider]  fexofenadine (ALLEGRA) 180 MG tablet Take 180 mg by mouth daily. Patient not taking: Reported on 03/04/2021    [provider]  fluticasone (FLONASE) 50 MCG/ACT nasal spray Place 1 spray into both nostrils  daily. Patient not taking: Reported on 10/08/2021 10/14/14   Johney Maine, MD  Glucosamine-Chondroit-Vit C-Mn (GLUCOSAMINE 1500 COMPLEX PO) Take 1 tablet by mouth daily.     [provider]  hydrochlorothiazide (HYDRODIURIL) 25 MG tablet Take 25 mg by mouth daily.  Patient not taking: Reported on 10/08/2021 05/11/15   [provider]  HYDROcodone-acetaminophen (NORCO) 7.5-325 MG tablet Take 1 tablet by mouth 2 (two) times daily as needed for pain. 01/10/18   [provider]  lansoprazole (PREVACID) 15 MG capsule Take 15 mg by mouth every morning.     [provider]  metoprolol succinate (TOPROL-XL) 50 MG 24 hr tablet Take 50 mg by mouth every morning. Take with or immediately following a meal.    [provider]  Multiple Vitamin (MULTIVITAMIN WITH MINERALS) TABS tablet Take 1 tablet by mouth daily.    [provider]  predniSONE (DELTASONE) 10 MG tablet Take 4 tablets by mouth with breakfast for 2 days, 2 tablets by mouth for 2  days and 1 tablet by mouth for 2 days. 10/08/21   Rushie Chestnutovington, Sarah M, PA-C  simvastatin (ZOCOR) 40 MG tablet Take 40 mg by mouth at bedtime.  12/25/17   [provider]  tamoxifen (NOLVADEX) 20 MG tablet TAKE 1 TABLET BY MOUTH DAILY 05/16/22   Earna CoderBrahmanday, Govinda R, MD  triamterene-hydrochlorothiazide (MAXZIDE-25) 37.5-25 MG tablet Take 1 tablet by mouth daily.    [provider]  venlafaxine XR (EFFEXOR-XR) 75 MG 24 hr capsule TAKE 1 CAPSULE BY MOUTH DAILY  WITH BREAKFAST 03/29/22   Earna CoderBrahmanday, Govinda R, MD  Vitamin D, Ergocalciferol, (DRISDOL) 1.25 MG (50000 UNIT) CAPS capsule TAKE 1 CAPSULE BY MOUTH EVERY 7  DAYS TAKE ON MONDAYS 04/18/22   Earna CoderBrahmanday, Govinda R, MD    Physical Exam: Vitals:   06/07/22 0852 06/07/22 1007 06/07/22 1023 06/07/22 1525  BP:   115/82 104/67  Pulse:   (!) 107 93  Resp:   16 20  Temp:   98.1 F (36.7 C) 98.8 F (37.1 C)  TempSrc:   Oral Oral  SpO2:  92% 97% 96%  Weight:  113.4 kg     Height: 5\' 2"  (1.575 m)      General: Not in acute distress HEENT:       Eyes: PERRL, EOMI, no scleral icterus.       ENT: No discharge from the ears and nose, no pharynx injection, no tonsillar enlargement.        Neck: No JVD, no bruit, no mass felt. Heme: No neck lymph node enlargement. Cardiac: S1/S2, RRR, No murmurs, No gallops or rubs. Respiratory: No rales, wheezing, rhonchi or rubs. GI: Soft, nondistended, has tenderness in right side of abdomen, no rebound pain, no organomegaly, BS present. GU: No hematuria Ext: No pitting leg edema bilaterally. 1+DP/PT pulse bilaterally. Musculoskeletal: No joint deformities, No joint redness or warmth, no limitation of ROM in spin. Skin: No rashes.  Neuro: Alert, oriented X3, cranial nerves II-XII grossly intact, moves all extremities normally.  Psych: Patient is not psychotic, no suicidal or hemocidal ideation.  Labs on Admission: I have personally reviewed following labs and imaging studies  CBC: Recent Labs  Lab 06/07/22 0852  WBC 22.1*  NEUTROABS 16.6*  HGB 15.8*  HCT 46.9*  MCV 94.9  PLT 196   Basic Metabolic Panel: Recent Labs  Lab 06/07/22 0852  NA 135  K 3.1*  CL 103  CO2 21*  GLUCOSE 174*  BUN 21  CREATININE 1.45*  CALCIUM 8.8*  MG 1.7   GFR: Estimated Creatinine Clearance: 36.8 mL/min (A) (by C-G formula based on SCr of 1.45 mg/dL (H)). Liver Function Tests: Recent Labs  Lab 06/07/22 0852 06/07/22 1433  AST 34  --   ALT 17  --   ALKPHOS 56  --   BILITOT 2.3* 1.6*  PROT 6.9  --   ALBUMIN 3.6  --    No results for input(s): "LIPASE", "AMYLASE" in the last 168 hours. No results for input(s): "AMMONIA" in the last 168 hours. Coagulation Profile: Recent Labs  Lab 06/07/22 1433  INR 1.1   Cardiac Enzymes: No results for input(s): "CKTOTAL", "CKMB", "CKMBINDEX", "TROPONINI" in the last 168 hours. BNP (last 3 results) No results for input(s): "PROBNP" in the last 8760 hours. HbA1C: No  results for input(s): "HGBA1C" in the last 72 hours. CBG: No results for input(s): "GLUCAP" in the last 168 hours. Lipid Profile: No results for input(s): "CHOL", "HDL", "LDLCALC", "TRIG", "CHOLHDL", "LDLDIRECT" in the last 72 hours. Thyroid  Function Tests: No results for input(s): "TSH", "T4TOTAL", "FREET4", "T3FREE", "THYROIDAB" in the last 72 hours. Anemia Panel: No results for input(s): "VITAMINB12", "FOLATE", "FERRITIN", "TIBC", "IRON", "RETICCTPCT" in the last 72 hours. Urine analysis:    Component Value Date/Time   COLORURINE YELLOW (A) 06/07/2022 0855   APPEARANCEUR HAZY (A) 06/07/2022 0855   APPEARANCEUR Clear 01/14/2021 1040   LABSPEC 1.017 06/07/2022 0855   PHURINE 6.0 06/07/2022 0855   GLUCOSEU NEGATIVE 06/07/2022 0855   HGBUR NEGATIVE 06/07/2022 0855   BILIRUBINUR NEGATIVE 06/07/2022 0855   BILIRUBINUR Negative 01/14/2021 1040   KETONESUR NEGATIVE 06/07/2022 0855   PROTEINUR NEGATIVE 06/07/2022 0855   UROBILINOGEN 0.2 11/21/2008 0930   NITRITE NEGATIVE 06/07/2022 0855   LEUKOCYTESUR NEGATIVE 06/07/2022 0855   Sepsis Labs: (procalcitonin:4,lacticidven:4) )No results found for this or any previous visit (from the past 240 hour(s)).   Radiological Exams on Admission: CT ABDOMEN PELVIS W CONTRAST  Result Date: 06/07/2022 CLINICAL DATA:  Recently treated for gout, now with abdominal discomfort, most prominently in the left lower quadrant EXAM: CT ABDOMEN AND PELVIS WITH CONTRAST TECHNIQUE: Multidetector CT imaging of the abdomen and pelvis was performed using the standard protocol following bolus administration of intravenous contrast. RADIATION DOSE REDUCTION: This exam was performed according to the departmental dose-optimization program which includes automated exposure control, adjustment of the mA and/or kV according to patient size and/or use of iterative reconstruction technique. CONTRAST:  80mL OMNIPAQUE IOHEXOL 300 MG/ML  SOLN COMPARISON:  None Available.  FINDINGS: Lower chest: Mild subpleural reticulations may reflect a degree of fibrosis. No focal consolidations. No pleural effusion or pneumothorax demonstrated. Partially imaged heart size is normal. Coronary artery calcifications. Hepatobiliary: No focal hepatic lesions. No intra or extrahepatic biliary ductal dilation. Cholecystectomy. Pancreas: No focal lesions or main ductal dilation. Spleen: Normal in size without focal abnormality. Adrenals/Urinary Tract: No adrenal nodules. No suspicious renal mass, calculi or hydronephrosis. Bilateral simple cysts. No specific follow-up imaging recommended. No focal bladder wall thickening. Stomach/Bowel: Small hiatal hernia. Normal appearance of the stomach. Segmental mural thickening of the ascending and mid to distal transverse colon. Normal appendix. Vascular/Lymphatic: Aortic atherosclerosis. No enlarged abdominal or pelvic lymph nodes. Reproductive: No adnexal masses. Other: No free fluid, fluid collection, or free air. Musculoskeletal: No acute or abnormal lytic or blastic osseous lesions. Multilevel degenerative changes of the partially imaged thoracic and lumbar spine. Grade 1 anterolisthesis at L4-5. Age indeterminate anterior wedging of T8 status post augmentation and T12. IMPRESSION: 1. Segmental mural thickening of the ascending and mid to distal transverse colon, likely infectious/inflammatory colitis. Recommend correlation with colonoscopy once acute symptoms have resolved if one has not been performed recently. 2. Aortic Atherosclerosis (ICD10-I70.0). Coronary artery calcifications. Assessment for potential risk factor modification, dietary therapy or pharmacologic therapy may be warranted, if clinically indicated. Electronically Signed   By: Agustin Cree M.D.   On: 06/07/2022 10:31   DG Chest 2 View  Result Date: 06/07/2022 CLINICAL DATA:  Suspected sepsis. EXAM: CHEST - 2 VIEW COMPARISON:  None Available. FINDINGS: The heart is enlarged. Pulmonary vascular  congestion without evidence of focal consolidation or pulmonary edema. Low lung volumes. Thoracic spondylosis. No acute osseous abnormality. IMPRESSION: Cardiomegaly with pulmonary vascular congestion. No evidence of focal consolidation, pleural effusion or frank pulmonary edema. Electronically Signed   By: Larose Hires D.O.   On: 06/07/2022 09:40      Assessment/Plan Principal Problem:   Acute colitis Active Problems:   Severe sepsis   Chronic kidney disease, stage 3b  Hypertension   Hyperlipidemia   Breast cancer of upper-outer quadrant of right female breast   Gout   Hypokalemia   Depression with anxiety   Obesity, Class III, BMI 40-49.9 (morbid obesity)   Assessment and Plan:   Severe sepsis due to acute colitis: Patient has severe sepsis with WBC 21.1, heart rate up to 130.  Lactic acid 3.0 --> 3.2.  Consulted Dr. Timothy Lasso for GI  -Admitted to telemetry bed as inpatient -IV Zosyn (patient received 1 dose of cefepime and Flagyl in ED) -Blood culture -IV fluid: total of 2.0 L normal saline, then 75 cc/h -Trend lactic acid level -Check procalcitonin level -As needed morphine, Zofran, Percocet, Tylenol  Chronic kidney disease, stage 3b: Stable -Monitor with BMP  Hypertension: -Metoprolol -IV hydralazine as needed  Hyperlipidemia -Zocor  Breast cancer of upper-outer quadrant of right female breast -Tamoxifen  Gout: Just finished a course of prednisone -As needed Tylenol  Hypokalemia: Potassium 3.1 -Repleted potassium -Check magnesium level --> 1.7  Depression with anxiety -Continue home medications  Obesity, Class III, BMI 40-49.9 (morbid obesity): Body weight 113.4 kg, BMI 45.73 -Encourage losing weight -Healthy diet and exercise     DVT ppx: SQ Heparin     Code Status: Full code  Family Communication:  Yes, patient's  husband at bed side.     Disposition Plan:  Anticipate discharge back to previous environment  Consults called:  Dr.  Timothy Lasso  Admission status and Level of care: Telemetry Medical:   as inpt      Dispo: The patient is from: Home              Anticipated d/c is to: Home              Anticipated d/c date is: 2 days              Patient currently is not medically stable to d/c.    Severity of Illness:  The appropriate patient status for this patient is INPATIENT. Inpatient status is judged to be reasonable and necessary in order to provide the required intensity of service to ensure the patient's safety. The patient's presenting symptoms, physical exam findings, and initial radiographic and laboratory data in the context of their chronic comorbidities is felt to place them at high risk for further clinical deterioration. Furthermore, it is not anticipated that the patient will be medically stable for discharge from the hospital within 2 midnights of admission.   * I certify that at the point of admission it is my clinical judgment that the patient will require inpatient hospital care spanning beyond 2 midnights from the point of admission due to high intensity of service, high risk for further deterioration and high frequency of surveillance required.*       Date of Service 06/07/2022    Lorretta Harp [facility]   If 7PM-7AM, please contact night-coverage [email or web address] 06/07/2022, 7:05 PM

## 2022-06-07 NOTE — Progress Notes (Signed)
CODE SEPSIS - PHARMACY COMMUNICATION  **Broad Spectrum Antibiotics should be administered within 1 hour of Sepsis diagnosis**  Time Code Sepsis Called/Page Received: 1010  Antibiotics Ordered: cefepime 2 grams x 1 and vancomycin 1,000 mg x 1  Time of 1st antibiotic administration: 1033  Additional action taken by pharmacy: none  If necessary, Name of Provider/Nurse Contacted: n/a    Elliot Gurney, PharmD, BCPS Clinical Pharmacist  06/07/2022 10:15 AM

## 2022-06-07 NOTE — ED Notes (Signed)
Second set of blood cultures obtained from L Hand and sent to lab at this time.

## 2022-06-07 NOTE — Progress Notes (Signed)
PHARMACY -  BRIEF ANTIBIOTIC NOTE   Pharmacy has received consults for vancomycin and cefepime from an ED provider.  The patient's profile has been reviewed for ht/wt/allergies/indication/available labs.    One time orders placed for vancomycin 1,000 mg x 1 and cefepime 2 grams x 1  Further antibiotics/pharmacy consults should be ordered by admitting physician if indicated.                       Thank you,  Elliot Gurney, PharmD, BCPS Clinical Pharmacist  06/07/2022 10:14 AM

## 2022-06-07 NOTE — ED Notes (Signed)
Lab to come and get remaining labs on pt.

## 2022-06-07 NOTE — Consult Note (Addendum)
GI Inpatient Consult Note  Reason for Consult: colitis   Attending Requesting Consult: Dr. Clyde Lundborg  History of Present Illness: Maria Gutierrez is a 82 y.o. female seen for evaluation of colitis/abd pain at the request of Dr. Clyde Lundborg. Past medical history significant of hypertension, hyperlipidemia, depression with anxiety, CKD-3B, breast cancer (s/p of radiation and chemotherapy 2016), right foot gout, who presents with abdominal pain.   CT on admission showed segmental mural thickening ascending and mid to distal transverse colon (likely infectious/inflammatory). Labs were remarkable for elevated lactic acid at 3.2. normal pro calcitonin. CBC w/ WBC 22.1. CMP w/ K+ 3.1, glucose 174. Cr 1.45 (baseline 8 months ago was 1.40). She has been started on ABX by pharmacy for possible sepsis. BP on admission was 95/76, HR 130, RR 18.  She reports feeling well until acutely around 06/03/22 - had finished 5 day course of prednisone for gout and had onset of mild abd pain and vomiting, she initially felt was food related or due to prednisone. Has not had further vomiting but continued to have worsening abd pain over past few days. Reports diffuse pain, worse on left yesterday but worse on right mid abd today. She reports a dark loose stool yesterday but denies constipation or diarrhea. Was moving her stools normally when pain began. Has not had a BM since admission. She had a poor appetite w/ the pain and mainly drinking liquids but denies any significantly worsening pain w/ eating/drinking. Feels she had a lot of gas/burping with the pain and was taking GasX q4 hours without improvement at home. Presented to ED due to generally feeling poorly and not able to due normal activities at home due to abd pain.   She reports chronic GERD symptoms at home, takes prevacid daily which helps but does have some breakthrough GERD. She denies any dysphagia. Appetite was good until acute symptoms started. Denies recent weight loss.  Had been on colchicine in recent past but caused some GI symptoms and discontinued.   Non smoker. No alcohol. Denies nsaids.    Husband at bedside.   Last Colonoscopy: 2010-diverticulosis sigmoid colon, 2 small polyps sigmoid, pathology w/ hyperplastic polyps EGD and colonoscopy 09/1997- unremarkable and internal hemorrhoids respectively   Past Medical History:  Past Medical History:  Diagnosis Date   Anemia    B12, d2 and magnesium deficiencies   Anxiety    Arthritis    OSTEOARTHRITIS   Breast cancer of upper-outer quadrant of right female breast 06/10/2014   10 mm invasive mammary carcinoma, T1b,Nx; ER 90%, 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 extensive intermediate grade DCIS.    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   DDD (degenerative disc disease), lumbar    Dyspnea    GERD (gastroesophageal reflux disease)    Heart murmur    History of blood transfusion    Hyperlipidemia    Hypertension    Personal history of chemotherapy 2016   right breast ca   Personal history of radiation therapy 2016   mammosite   SOB (shortness of breath) 12/03/2014    Problem List: Patient Active Problem List   Diagnosis Date Noted   Acute colitis 06/07/2022   Severe sepsis 06/07/2022   Obesity, Class III, BMI 40-49.9 (morbid obesity) 06/07/2022   Depression with anxiety 06/07/2022   Hypokalemia 06/07/2022   Chronic kidney disease, stage 3b 06/07/2022   Gout 06/07/2022   Nipple lesion 06/28/2017   Bone pain 10/06/2016   Carcinoma of  overlapping sites of right breast in female, estrogen receptor positive 12/10/2015   SOB (shortness of breath) 12/03/2014   Hyperlipidemia 07/28/2014   Hypertension 07/28/2014   Allergic rhinitis, seasonal 07/28/2014   Breast cancer of upper-outer quadrant of right female breast 06/10/2014   Arthritis, degenerative 12/23/2013   Arthritis 01/17/2012    Past Surgical History: Past Surgical History:  Procedure Laterality Date    ABDOMINAL HYSTERECTOMY     AXILLARY LYMPH NODE BIOPSY Right 07/14/2014   Procedure: AXILLARY LYMPH NODE BIOPSY;  Surgeon: Earline MayotteJeffrey W Byrnett, MD;  Location: [facility] ORS;  Service: General;  Laterality: Right;   BACK SURGERY  12/29/2020   BOTOX INJECTION N/A 01/13/2020   Procedure: BOTOX INJECTION;  Surgeon: Vanna ScotlandBrandon, Ashley, MD;  Location: [facility] ORS;  Service: Urology;  Laterality: N/A;   BREAST BIOPSY Left 2012   benign   BREAST BIOPSY Right 05/26/2014   DCIS    BREAST EXCISIONAL BIOPSY Right 06/28/2017    ULCERATED SKIN WITH UNDERLYING FAT NECROSIS, ACUTE INFLAMMATION AND REACTIVE EPITHELIAL ATYPIA   BREAST LUMPECTOMY Right 06/10/2014   DCIS and Invasive ductal carcinoma, clear margins   BREAST SURGERY Right 06/10/2014   Wide excision for intermediate grade DCIS, identification of a 10  millimeter area of invasive mammary carcinoma.   CHOLECYSTECTOMY  2012   COLONOSCOPY  2009   EYE SURGERY Bilateral 2014   cataract   IR KYPHO EA ADDL LEVEL THORACIC OR LUMBAR  02/12/2021   JOINT REPLACEMENT Left 2001   knee   JOINT REPLACEMENT Right 2003   knee   JOINT REPLACEMENT Left 2004   replacement joint broken   kneee surgery Left    PORT-A-CATH REMOVAL  02/11/2016   Dr Lemar LivingsByrnett   PORTACATH PLACEMENT Left 08/27/2014   Procedure: INSERTION PORT-A-CATH;  Surgeon: Earline MayotteJeffrey W Byrnett, MD;  Location: [facility] ORS;  Service: General;  Laterality: Left;    Allergies: No Known Allergies  Home Medications: (Not in a hospital admission)  Home medication reconciliation was completed with the patient.   Scheduled Inpatient Medications:    [START ON 06/08/2022] aspirin EC  81 mg Oral Daily   [START ON 06/08/2022] calcium carbonate  1 tablet Oral Q breakfast   heparin  5,000 Units Subcutaneous Q8H   [START ON 06/08/2022] metoprolol succinate  50 mg Oral BH-q7a   [START ON 06/08/2022] multivitamin with minerals  1 tablet Oral Daily   [START ON 06/08/2022] pantoprazole  20 mg Oral Daily   potassium chloride   60 mEq Oral Once   [START ON 06/08/2022] simvastatin  40 mg Oral QHS   tamoxifen  20 mg Oral Daily   [START ON 06/08/2022] venlafaxine XR  75 mg Oral Q breakfast    Continuous Inpatient Infusions:    sodium chloride     lactated ringers 150 mL/hr at 06/07/22 1104   piperacillin-tazobactam (ZOSYN)  IV     sodium chloride      PRN Inpatient Medications:  acetaminophen, hydrALAZINE, meclizine, morphine injection, ondansetron (ZOFRAN) IV, oxyCODONE-acetaminophen  Family History: family history includes Heart disease in her sister.    Social History:   reports that she has never smoked. She has never used smokeless tobacco. She reports that she does not drink alcohol and does not use drugs.   Review of Systems: Constitutional: Weight is stable.  Eyes: No changes in vision. ENT: No oral lesions, sore throat.  GI: see HPI.  Heme/Lymph: No easy bruising.  CV: No chest pain.  GU: No hematuria.  Integumentary: No rashes.  Neuro: No headaches.  Psych: No depression/anxiety.  Endocrine: No heat/cold intolerance.  Allergic/Immunologic: No urticaria.  Resp: No cough, SOB.  Musculoskeletal: No joint swelling.    Physical Examination: BP 115/82 (BP Location: Left Arm)   Pulse (!) 107   Temp 98.1 F (36.7 C) (Oral)   Resp 16   Ht 5\' 2"  (1.575 m)   Wt 113.4 kg   SpO2 97%   BMI 45.73 kg/m  Gen: NAD, alert and oriented x 4 HEENT: PEERLA, EOMI, Neck: supple, no JVD or thyromegaly Chest: CTA bilaterally, no wheezes, crackles, or other adventitious sounds CV: RRR, no m/g/c/r Abd: soft, diffuse TTP worse RLQ, ND, +BS in all four quadrants; no HSM, guarding, ridigity, or rebound tenderness Ext: no edema, well perfused with 2+ pulses, Skin: no rash or lesions noted  Data: Lab Results  Component Value Date   WBC 22.1 (H) 06/07/2022   HGB 15.8 (H) 06/07/2022   HCT 46.9 (H) 06/07/2022   MCV 94.9 06/07/2022   PLT 196 06/07/2022   Recent Labs  Lab 06/07/22 0852  HGB 15.8*    Lab Results  Component Value Date   NA 135 06/07/2022   K 3.1 (L) 06/07/2022   CL 103 06/07/2022   CO2 21 (L) 06/07/2022   BUN 21 06/07/2022   CREATININE 1.45 (H) 06/07/2022   Lab Results  Component Value Date   ALT 17 06/07/2022   AST 34 06/07/2022   ALKPHOS 56 06/07/2022   BILITOT 2.3 (H) 06/07/2022   No results for input(s): "APTT", "INR", "PTT" in the last 168 hours.    Assessment/Plan: Ms. Sula is a 82 y.o. female admitted for abd pain.   Abdominal pain - CT shows colitis, may be infectious. No AKI and normal LFTS would suggest against ischemia.  Will check C diff, GI PCR to evaluate for infection. Sed rate and CRP also pending. Has been started on zoysn, metronidazole, cefepime. Would recommend supportive care, will hold off on inpatient colonoscopy but would consider as outpatient if not improving in a few weeks.  Acute Diarrhea - none since admission, loose dark stool yesterday, stool studies pending.  Hypokalemia - replaced per primary team Sepsis - blood cultures pending Gout  Recommendations: Evaluation w/ stool studies for infectious etiology Continue supportive care per primary team Consider colonoscopy as outpatient if not improving.  Agree w/ clear liquid diet and advancing as tolerated  Case was discussed with Dr. Timothy Lasso. Thank you for the consult. Please call with questions or concerns.  Ardelia Mems, PA-C [facility]

## 2022-06-08 DIAGNOSIS — K529 Noninfective gastroenteritis and colitis, unspecified: Secondary | ICD-10-CM | POA: Diagnosis not present

## 2022-06-08 LAB — BASIC METABOLIC PANEL
Anion gap: 7 (ref 5–15)
BUN: 13 mg/dL (ref 8–23)
CO2: 25 mmol/L (ref 22–32)
Calcium: 8.1 mg/dL — ABNORMAL LOW (ref 8.9–10.3)
Chloride: 106 mmol/L (ref 98–111)
Creatinine, Ser: 1.17 mg/dL — ABNORMAL HIGH (ref 0.44–1.00)
GFR, Estimated: 47 mL/min — ABNORMAL LOW (ref >60)
Glucose, Bld: 97 mg/dL (ref 70–99)
Potassium: 3.4 mmol/L — ABNORMAL LOW (ref 3.5–5.1)
Sodium: 138 mmol/L (ref 135–145)

## 2022-06-08 LAB — LACTIC ACID, PLASMA: Lactic Acid, Venous: 1.4 mmol/L (ref 0.5–1.9)

## 2022-06-08 LAB — CBC
HCT: 38.3 % (ref 36.0–46.0)
Hemoglobin: 12.8 g/dL (ref 12.0–15.0)
MCH: 32.3 pg (ref 26.0–34.0)
MCHC: 33.4 g/dL (ref 30.0–36.0)
MCV: 96.7 fL (ref 80.0–100.0)
Platelets: 124 10*3/uL — ABNORMAL LOW (ref 150–400)
RBC: 3.96 MIL/uL (ref 3.87–5.11)
RDW: 13.2 % (ref 11.5–15.5)
WBC: 18.5 10*3/uL — ABNORMAL HIGH (ref 4.0–10.5)
nRBC: 0 % (ref 0.0–0.2)

## 2022-06-08 MED ORDER — ORAL CARE MOUTH RINSE: 15.0000 mL | OROMUCOSAL | Status: DC | PRN

## 2022-06-08 MED ORDER — POTASSIUM CHLORIDE CRYS ER 20 MEQ PO TBCR
40.0000 meq | EXTENDED_RELEASE_TABLET | ORAL | Status: AC
  Administered 2022-06-08 (×2): 40 meq via ORAL
  Filled 2022-06-08 (×2): qty 2

## 2022-06-08 MED ORDER — MAGNESIUM SULFATE 2 GM/50ML IV SOLN
2.0000 g | Freq: Once | INTRAVENOUS | Status: AC
  Administered 2022-06-08: 2 g via INTRAVENOUS
  Filled 2022-06-08: qty 50

## 2022-06-08 NOTE — Progress Notes (Signed)
Progress Note   Patient: Maria Gutierrez QIO:[id number] DOB: 10/19/1940 DOA: 06/07/2022     1 DOS: the patient was seen and examined on 06/08/2022    Subjective:  Patient seen and examined at bedside this morning in the presence of patient's husband She tells me her abdominal pain is slightly better Denies nausea vomiting chest pain   Brief hospital course:  MEILYN JOSUE is a 82 y.o. female with medical history significant of hypertension, hyperlipidemia, depression with anxiety, CKD-3B, breast cancer (s/p of radiation and chemotherapy 2016), right foot gout, who presents with abdominal pain.   Patient states that she has abdominal pain for more than 3 days, which is mainly located in the right side of abdomen, sharp, 9 out of 10 in severity, radiating to the back, associated with nausea, burping, dry heaves.  No diarrhea.  Patient has low-grade fever and chills at home.  Patient does not have chest pain, cough, shortness breath.  No symptoms of UTI.  Patient states that she just finished a course of prednisone treatment for right foot gout.  Her right foot pain has significantly improved.  CT scan of the abdomen showed: Segmental mural thickening of the ascending and mid to distal transverse colon, likely infectious/inflammatory colitis.   Assessment and Plan:  Severe sepsis due to acute colitis:  Patient has severe sepsis with WBC 21.1, heart rate up to 130.  Lactic acid 3.0 --> 3.2.  Consulted Dr. Timothy Lasso for GI   -Continue current antibiotics Follow-up on culture results -Continue current supplemental IV fluid -Trend lactic acid level -Check procalcitonin level -As needed morphine, Zofran, Percocet, Tylenol    Hypokalemia-continue repletion and monitoring  Hypo-magnesium-continue repletion and monitoring   Chronic kidney disease, stage 3b: Stable -Monitor with BMP   Hypertension: -Metoprolol -IV hydralazine as needed   Hyperlipidemia -Zocor   Breast cancer of  upper-outer quadrant of right female breast -Tamoxifen   Gout: Just finished a course of prednisone -As needed Tylenol     Depression with anxiety -Continue home medications   Obesity, Class III, BMI 40-49.9 (morbid obesity): Body weight 113.4 kg, BMI 45.73 -Encourage losing weight -Healthy diet and exercise     DVT ppx: SQ Heparin      Code Status: Full code     Physical Exam: General: Not in acute distress HEENT: EOMI Cardiac: S1/S2, RRR, No murmurs, No gallops or rubs. Respiratory: No rales, wheezing, rhonchi or rubs. GI: Soft, nondistended, has tenderness in right side of abdomen, GU: No hematuria Ext: No pitting leg edema bilaterally. 1+DP/PT pulse bilaterally. Musculoskeletal: No joint deformities,  Skin: No rashes.  Neuro: Alert, oriented X3,  Psych: Patient is not psychotic   Data Reviewed: Personally reviewed patient's laboratory results and white blood cell count has improved from 22,000-18,000 today  Family Communication: Husband present at bedside  Disposition: Status is: Inpatient Remains inpatient appropriate because: As patient continues to require IV antibiotic management   Planned Discharge Destination: Home  Time spent: 45 minutes  Vitals:   06/08/22 0550 06/08/22 0836 06/08/22 0953 06/08/22 1551  BP: 118/64 113/62  123/78  Pulse: 97 94  91  Resp: (!) 21 18  18   Temp: 98.3 F (36.8 C) 100.2 F (37.9 C) 98.5 F (36.9 C) 98.4 F (36.9 C)  TempSrc: Oral   Oral  SpO2: 95% 100%  95%  Weight:      Height:         Author: Loyce Dys, MD 06/08/2022 4:29 PM  For on  call review [email or web address].

## 2022-06-09 DIAGNOSIS — K529 Noninfective gastroenteritis and colitis, unspecified: Secondary | ICD-10-CM | POA: Diagnosis not present

## 2022-06-09 LAB — GASTROINTESTINAL PANEL BY PCR, STOOL (REPLACES STOOL CULTURE)

## 2022-06-09 LAB — C DIFFICILE QUICK SCREEN W PCR REFLEX
C Diff antigen: NEGATIVE
C Diff interpretation: NOT DETECTED
C Diff toxin: NEGATIVE

## 2022-06-09 LAB — CBC WITH DIFFERENTIAL/PLATELET
Abs Immature Granulocytes: 0.08 10*3/uL — ABNORMAL HIGH (ref 0.00–0.07)
Basophils Absolute: 0.1 10*3/uL (ref 0.0–0.1)
Basophils Relative: 0 %
Eosinophils Absolute: 0.3 10*3/uL (ref 0.0–0.5)
Eosinophils Relative: 2 %
HCT: 37.9 % (ref 36.0–46.0)
Hemoglobin: 12.6 g/dL (ref 12.0–15.0)
Immature Granulocytes: 1 %
Lymphocytes Relative: 21 %
Lymphs Abs: 3.2 10*3/uL (ref 0.7–4.0)
MCH: 32.4 pg (ref 26.0–34.0)
MCHC: 33.2 g/dL (ref 30.0–36.0)
MCV: 97.4 fL (ref 80.0–100.0)
Monocytes Absolute: 1 10*3/uL (ref 0.1–1.0)
Monocytes Relative: 7 %
Neutro Abs: 10.6 10*3/uL — ABNORMAL HIGH (ref 1.7–7.7)
Neutrophils Relative %: 69 %
Platelets: 116 10*3/uL — ABNORMAL LOW (ref 150–400)
RBC: 3.89 MIL/uL (ref 3.87–5.11)
RDW: 13.2 % (ref 11.5–15.5)
WBC: 15.2 10*3/uL — ABNORMAL HIGH (ref 4.0–10.5)
nRBC: 0 % (ref 0.0–0.2)

## 2022-06-09 LAB — BASIC METABOLIC PANEL
Anion gap: 8 (ref 5–15)
BUN: 10 mg/dL (ref 8–23)
CO2: 24 mmol/L (ref 22–32)
Calcium: 8.2 mg/dL — ABNORMAL LOW (ref 8.9–10.3)
Chloride: 107 mmol/L (ref 98–111)
Creatinine, Ser: 1.13 mg/dL — ABNORMAL HIGH (ref 0.44–1.00)
GFR, Estimated: 49 mL/min — ABNORMAL LOW (ref >60)
Glucose, Bld: 92 mg/dL (ref 70–99)
Potassium: 3.3 mmol/L — ABNORMAL LOW (ref 3.5–5.1)
Sodium: 139 mmol/L (ref 135–145)

## 2022-06-09 LAB — MAGNESIUM: Magnesium: 1.8 mg/dL (ref 1.7–2.4)

## 2022-06-09 MED ORDER — LOPERAMIDE HCL 2 MG PO CAPS
2.0000 mg | ORAL_CAPSULE | Freq: Once | ORAL | Status: AC
  Administered 2022-06-09: 2 mg via ORAL
  Filled 2022-06-09: qty 1

## 2022-06-09 NOTE — Progress Notes (Signed)
Progress Note   Patient: Maria Gutierrez [id number] DOB: 1940-09-28 DOA: 06/07/2022     2 DOS: the patient was seen and examined on 06/09/2022     Subjective:  Patient seen and examined at bedside this morning  She tells me her abdominal pain is slightly better Denies nausea vomiting chest pain     Brief hospital course:  Maria Gutierrez is a 82 y.o. female with medical history significant of hypertension, hyperlipidemia, depression with anxiety, CKD-3B, breast cancer (s/p of radiation and chemotherapy 2016), right foot gout, who presents with abdominal pain.   Patient states that she has abdominal pain for more than 3 days, which is mainly located in the right side of abdomen, sharp, 9 out of 10 in severity, radiating to the back, associated with nausea, burping, dry heaves.  No diarrhea.  Patient has low-grade fever and chills at home.  Patient does not have chest pain, cough, shortness breath.  No symptoms of UTI.  Patient states that she just finished a course of prednisone treatment for right foot gout.  Her right foot pain has significantly improved.   CT scan of the abdomen showed: Segmental mural thickening of the ascending and mid to distal transverse colon, likely infectious/inflammatory colitis.     Assessment and Plan:  Severe sepsis due to acute colitis:  Patient has severe sepsis with WBC 21.1, heart rate up to 130.  Lactic acid 3.0 --> 3.2.  Consulted Dr. Timothy Lasso for GI   -Continue current antibiotics Follow-up on culture results -Continue current supplemental IV fluid -As needed morphine, Zofran, Percocet, Tylenol     Hypokalemia-continue repletion and monitoring   Hypo-magnesium-continue repletion and monitoring     Chronic kidney disease, stage 3b: Stable -Monitor with BMP   Hypertension: -Metoprolol -IV hydralazine as needed   Hyperlipidemia -Zocor   Breast cancer of upper-outer quadrant of right female breast -Tamoxifen   Gout: Just finished a course  of prednisone -As needed Tylenol     Depression with anxiety -Continue home medications   Obesity, Class III, BMI 40-49.9 (morbid obesity): Body weight 113.4 kg, BMI 45.73 -Encourage losing weight -Healthy diet and exercise     DVT ppx: SQ Heparin      Code Status: Full code     Physical Exam: General: Not in acute distress HEENT: EOMI Cardiac: S1/S2, RRR, No murmurs, No gallops or rubs. Respiratory: No rales, wheezing, rhonchi or rubs. GI: Soft, nondistended, has tenderness in right side of abdomen, GU: No hematuria Ext: No pitting leg edema bilaterally. 1+DP/PT pulse bilaterally. Musculoskeletal: No joint deformities,  Skin: No rashes.  Neuro: Alert, oriented X3,  Psych: Patient is not psychotic   Data Reviewed: Personally reviewed patient's laboratory results and white blood cell count has improved from 22,000- to 15,000 today   Family Communication: Husband present at bedside   Disposition: Status is: Inpatient Remains inpatient appropriate because: As patient continues to require IV antibiotic management    Planned Discharge Destination: Home   Time spent: 45 minutes  Vitals:   06/08/22 1551 06/08/22 1924 06/09/22 0612 06/09/22 0825  BP: 123/78 109/87 114/70 129/77  Pulse: 91 96 91 89  Resp: 18 20 17 18   Temp: 98.4 F (36.9 C) 98.6 F (37 C) 98.1 F (36.7 C) 98.5 F (36.9 C)  TempSrc: Oral Oral  Oral  SpO2: 95% 95% 95% 94%  Weight:      Height:         Author: Loyce Dys, MD 06/09/2022 3:16 PM  For on call review [email or web address].

## 2022-06-09 NOTE — TOC Initial Note (Signed)
Transition of Care [facility]) - Initial/Assessment Note    Patient Details  Name: Maria Gutierrez MRN: [id number] Date of Birth: [birth year]  Transition of Care [facility]) CM/SW Contact:    Chapman Fitch, RN Phone Number: 06/09/2022, 11:21 AM  Clinical Narrative:                   Transition of Care [facility]) Screening Note   Patient Details  Name: Maria Gutierrez Date of Birth: Jul 12, 1940   Transition of Care [facility]) CM/SW Contact:    Chapman Fitch, RN Phone Number: 06/09/2022, 11:21 AM    Transition of Care Department [facility]) has reviewed patient and no TOC needs have been identified at this time. We will continue to monitor patient advancement through interdisciplinary progression rounds. If new patient transition needs arise, please place a TOC consult.         Patient Goals and CMS Choice            Expected Discharge Plan and Services                                              Prior Living Arrangements/Services                       Activities of Daily Living Home Assistive Devices/Equipment: Cane (specify quad or straight) ADL Screening (condition at time of admission) Patient's cognitive ability adequate to safely complete daily activities?: Yes Is the patient deaf or have difficulty hearing?: No Does the patient have difficulty seeing, even when wearing glasses/contacts?: No Does the patient have difficulty concentrating, remembering, or making decisions?: No Patient able to express need for assistance with ADLs?: Yes Does the patient have difficulty dressing or bathing?: No Independently performs ADLs?: Yes (appropriate for developmental age) Does the patient have difficulty walking or climbing stairs?: No Weakness of Legs: None Weakness of Arms/Hands: None  Permission Sought/Granted                  Emotional Assessment              Admission diagnosis:  Colitis [K52.9] Acute colitis [K52.9] Sepsis, due to unspecified  organism, unspecified whether acute organ dysfunction present [A41.9] Patient Active Problem List   Diagnosis Date Noted   Acute colitis 06/07/2022   Severe sepsis 06/07/2022   Obesity, Class III, BMI 40-49.9 (morbid obesity) 06/07/2022   Depression with anxiety 06/07/2022   Hypokalemia 06/07/2022   Chronic kidney disease, stage 3b 06/07/2022   Gout 06/07/2022   Nipple lesion 06/28/2017   Bone pain 10/06/2016   Carcinoma of overlapping sites of right breast in female, estrogen receptor positive 12/10/2015   SOB (shortness of breath) 12/03/2014   Hyperlipidemia 07/28/2014   Hypertension 07/28/2014   Allergic rhinitis, seasonal 07/28/2014   Breast cancer of upper-outer quadrant of right female breast 06/10/2014   Arthritis, degenerative 12/23/2013   Arthritis 01/17/2012   PCP:  Jerl Mina, MD Pharmacy:   [facility] [address], [place] - [address] [place] [address] Phone: [phone] Fax: [phone]  [facility] [place] - [address] [facility] [address] Mail [facility] [place] [address] Phone: [phone] Fax: [phone]  [facility]) - [place], [place] - [address] [place] [phone]  [employer] [address] [place] [address] Phone: [phone] Fax: [phone]  [facility] - [place], [place] - [address] [place] [address] Phone: [phone] Fax: [phone]     Social Determinants of Health (SDOH) Social History: SDOH Screenings   Food Insecurity: No Food Insecurity (06/07/2022)  Housing: Low Risk  (06/07/2022)  Transportation Needs: No Transportation Needs (06/07/2022)  Utilities: Not At Risk (06/07/2022)  Tobacco Use: Low Risk  (06/07/2022)   SDOH Interventions:     Readmission Risk Interventions     No data to display

## 2022-06-10 DIAGNOSIS — K529 Noninfective gastroenteritis and colitis, unspecified: Secondary | ICD-10-CM | POA: Diagnosis not present

## 2022-06-10 LAB — CBC WITH DIFFERENTIAL/PLATELET
Abs Immature Granulocytes: 0.05 10*3/uL (ref 0.00–0.07)
Basophils Absolute: 0 10*3/uL (ref 0.0–0.1)
Basophils Relative: 0 %
Eosinophils Absolute: 0.4 10*3/uL (ref 0.0–0.5)
Eosinophils Relative: 3 %
HCT: 35.6 % — ABNORMAL LOW (ref 36.0–46.0)
Hemoglobin: 11.7 g/dL — ABNORMAL LOW (ref 12.0–15.0)
Immature Granulocytes: 1 %
Lymphocytes Relative: 26 %
Lymphs Abs: 2.8 10*3/uL (ref 0.7–4.0)
MCH: 32.3 pg (ref 26.0–34.0)
MCHC: 32.9 g/dL (ref 30.0–36.0)
MCV: 98.3 fL (ref 80.0–100.0)
Monocytes Absolute: 0.9 10*3/uL (ref 0.1–1.0)
Monocytes Relative: 8 %
Neutro Abs: 6.8 10*3/uL (ref 1.7–7.7)
Neutrophils Relative %: 62 %
Platelets: 136 10*3/uL — ABNORMAL LOW (ref 150–400)
RBC: 3.62 MIL/uL — ABNORMAL LOW (ref 3.87–5.11)
RDW: 13.1 % (ref 11.5–15.5)
WBC: 11 10*3/uL — ABNORMAL HIGH (ref 4.0–10.5)
nRBC: 0 % (ref 0.0–0.2)

## 2022-06-10 LAB — BASIC METABOLIC PANEL
Anion gap: 9 (ref 5–15)
BUN: 9 mg/dL (ref 8–23)
CO2: 25 mmol/L (ref 22–32)
Calcium: 8.1 mg/dL — ABNORMAL LOW (ref 8.9–10.3)
Chloride: 105 mmol/L (ref 98–111)
Creatinine, Ser: 1.13 mg/dL — ABNORMAL HIGH (ref 0.44–1.00)
GFR, Estimated: 49 mL/min — ABNORMAL LOW (ref >60)
Glucose, Bld: 101 mg/dL — ABNORMAL HIGH (ref 70–99)
Potassium: 3.2 mmol/L — ABNORMAL LOW (ref 3.5–5.1)
Sodium: 139 mmol/L (ref 135–145)

## 2022-06-10 LAB — MAGNESIUM: Magnesium: 1.6 mg/dL — ABNORMAL LOW (ref 1.7–2.4)

## 2022-06-10 MED ORDER — CIPROFLOXACIN HCL 500 MG PO TABS: 500.0000 mg | ORAL_TABLET | Freq: Two times a day (BID) | ORAL | 0 refills | Status: AC

## 2022-06-10 MED ORDER — POTASSIUM CHLORIDE 20 MEQ PO PACK
40.0000 meq | PACK | ORAL | Status: DC
  Administered 2022-06-10: 40 meq via ORAL
  Filled 2022-06-10 (×2): qty 2

## 2022-06-10 MED ORDER — LOPERAMIDE HCL 2 MG PO CAPS
4.0000 mg | ORAL_CAPSULE | Freq: Once | ORAL | Status: AC
  Administered 2022-06-10: 4 mg via ORAL
  Filled 2022-06-10: qty 2

## 2022-06-10 MED ORDER — METRONIDAZOLE 500 MG PO TABS: 500.0000 mg | ORAL_TABLET | Freq: Three times a day (TID) | ORAL | 0 refills | Status: AC

## 2022-06-10 MED ORDER — ACETAMINOPHEN 325 MG PO TABS: 650.0000 mg | ORAL_TABLET | Freq: Four times a day (QID) | ORAL | 0 refills | Status: AC | PRN

## 2022-06-10 NOTE — Discharge Summary (Signed)
Physician Discharge Summary   Patient: Maria Gutierrez MRN: [id number] DOB: 05-28-1940  Admit date:     06/07/2022  Discharge date: 06/10/22  Discharge Physician: Loyce Dys   PCP: Jerl Mina, MD    Discharge Diagnoses: Severe sepsis due to acute colitis:  Hypokalemia-improved Hypo-magnesium-improved Chronic kidney disease, stage 3b: Stable Hypertension: Hyperlipidemia Breast cancer of upper-outer quadrant of right female breast Gout:  Depression with anxiety Obesity, Class III, BMI 40-49.9 (morbid obesity): Body weight 113.4 kg, BMI 45.73    Hospital Course: Maria Gutierrez is a 82 y.o. female with medical history significant of hypertension, hyperlipidemia, depression with anxiety, CKD-3B, breast cancer (s/p of radiation and chemotherapy 2016), right foot gout, who presents with abdominal pain.   Patient states that she has abdominal pain for more than 3 days, which is mainly located in the right side of abdomen, sharp, 9 out of 10 in severity, radiating to the back, associated with nausea, burping, dry heaves.  No diarrhea.  Patient has low-grade fever and chills at home.  Patient does not have chest pain, cough, shortness breath.  No symptoms of UTI.  Patient states that she just finished a course of prednisone treatment for right foot gout.  Her right foot pain has significantly improved.  CT scan of the abdomen showed: Segmental mural thickening of the ascending and mid to distal transverse colon, likely infectious/inflammatory colitis. Bacterial stool studies came back negative.  Abdominal pain improved.  He did have some episode of diarrhea however currently resolved.  Given the improvement she is currently being discharged home to complete antibiotic course and to follow-up as an outpatient with primary care physician    Assessment and Plan: No notes have been filed under this hospital service. Service: Hospitalist  Consultants: None Procedures performed:  None Disposition: Home Diet recommendation:  Discharge Diet Orders (From admission, onward)     Start     Ordered   06/10/22 0000  Diet - low sodium heart healthy        06/10/22 1522           Cardiac diet DISCHARGE MEDICATION: Allergies as of 06/10/2022   No Known Allergies      Medication List     STOP taking these medications    allopurinol 100 MG tablet Commonly known as: ZYLOPRIM   colchicine 0.6 MG tablet   GLUCOSAMINE 1500 COMPLEX PO   hydrochlorothiazide 25 MG tablet Commonly known as: HYDRODIURIL   predniSONE 10 MG tablet Commonly known as: DELTASONE       TAKE these medications    acetaminophen 325 MG tablet Commonly known as: TYLENOL Take 2 tablets (650 mg total) by mouth every 6 (six) hours as needed for mild pain or fever.   aspirin EC 81 MG tablet Take 81 mg by mouth daily.   calcium carbonate 600 MG Tabs tablet Commonly known as: OS-CAL Take 600 mg by mouth daily.   cyanocobalamin 1000 MCG tablet Take 1,000 mcg by mouth daily.   cyclobenzaprine 10 MG tablet Commonly known as: FLEXERIL 1/2-1 po qHS prn   fexofenadine 180 MG tablet Commonly known as: ALLEGRA Take 180 mg by mouth daily.   fluticasone 50 MCG/ACT nasal spray Commonly known as: Flonase Place 1 spray into both nostrils daily.   HYDROcodone-acetaminophen 7.5-325 MG tablet Commonly known as: NORCO Take 1 tablet by mouth 2 (two) times daily as needed for pain.   lansoprazole 15 MG capsule Commonly known as: PREVACID Take 15 mg by mouth every  morning.   meclizine 25 MG tablet Commonly known as: ANTIVERT Take 25 mg by mouth 3 (three) times daily as needed for dizziness.   metoprolol succinate 50 MG 24 hr tablet Commonly known as: TOPROL-XL Take 50 mg by mouth every morning. Take with or immediately following a meal.   multivitamin with minerals Tabs tablet Take 1 tablet by mouth daily.   simvastatin 40 MG tablet Commonly known as: ZOCOR Take 40 mg by mouth  at bedtime.   tamoxifen 20 MG tablet Commonly known as: NOLVADEX TAKE 1 TABLET BY MOUTH DAILY   triamterene-hydrochlorothiazide 37.5-25 MG tablet Commonly known as: MAXZIDE-25 Take 1 tablet by mouth daily.   venlafaxine XR 75 MG 24 hr capsule Commonly known as: EFFEXOR-XR TAKE 1 CAPSULE BY MOUTH DAILY  WITH BREAKFAST   Vitamin D (Ergocalciferol) 1.25 MG (50000 UNIT) Caps capsule Commonly known as: DRISDOL TAKE 1 CAPSULE BY MOUTH EVERY 7  DAYS TAKE ON MONDAYS        Discharge Exam: Filed Weights   06/07/22 0852  Weight: 113.4 kg   General: Not in acute distress HEENT: EOMI Cardiac: S1/S2, RRR, No murmurs, No gallops or rubs. Respiratory: No rales, wheezing, rhonchi or rubs. GI: Soft, nondistended, tenderness improved GU: No hematuria Ext: No pitting leg edema bilaterally. 1+DP/PT pulse bilaterally. Musculoskeletal: No joint deformities,  Skin: No rashes.  Neuro: Alert, oriented X3,  Psych: Patient is not psychotic  Condition at discharge: good  The results of significant diagnostics from this hospitalization (including imaging, microbiology, ancillary and laboratory) are listed below for reference.   Imaging Studies: CT ABDOMEN PELVIS W CONTRAST  Result Date: 06/07/2022 CLINICAL DATA:  Recently treated for gout, now with abdominal discomfort, most prominently in the left lower quadrant EXAM: CT ABDOMEN AND PELVIS WITH CONTRAST TECHNIQUE: Multidetector CT imaging of the abdomen and pelvis was performed using the standard protocol following bolus administration of intravenous contrast. RADIATION DOSE REDUCTION: This exam was performed according to the departmental dose-optimization program which includes automated exposure control, adjustment of the mA and/or kV according to patient size and/or use of iterative reconstruction technique. CONTRAST:  80mL OMNIPAQUE IOHEXOL 300 MG/ML  SOLN COMPARISON:  None Available. FINDINGS: Lower chest: Mild subpleural reticulations may  reflect a degree of fibrosis. No focal consolidations. No pleural effusion or pneumothorax demonstrated. Partially imaged heart size is normal. Coronary artery calcifications. Hepatobiliary: No focal hepatic lesions. No intra or extrahepatic biliary ductal dilation. Cholecystectomy. Pancreas: No focal lesions or main ductal dilation. Spleen: Normal in size without focal abnormality. Adrenals/Urinary Tract: No adrenal nodules. No suspicious renal mass, calculi or hydronephrosis. Bilateral simple cysts. No specific follow-up imaging recommended. No focal bladder wall thickening. Stomach/Bowel: Small hiatal hernia. Normal appearance of the stomach. Segmental mural thickening of the ascending and mid to distal transverse colon. Normal appendix. Vascular/Lymphatic: Aortic atherosclerosis. No enlarged abdominal or pelvic lymph nodes. Reproductive: No adnexal masses. Other: No free fluid, fluid collection, or free air. Musculoskeletal: No acute or abnormal lytic or blastic osseous lesions. Multilevel degenerative changes of the partially imaged thoracic and lumbar spine. Grade 1 anterolisthesis at L4-5. Age indeterminate anterior wedging of T8 status post augmentation and T12. IMPRESSION: 1. Segmental mural thickening of the ascending and mid to distal transverse colon, likely infectious/inflammatory colitis. Recommend correlation with colonoscopy once acute symptoms have resolved if one has not been performed recently. 2. Aortic Atherosclerosis (ICD10-I70.0). Coronary artery calcifications. Assessment for potential risk factor modification, dietary therapy or pharmacologic therapy may be warranted, if clinically indicated. Electronically Signed  By: Agustin Cree M.D.   On: 06/07/2022 10:31   DG Chest 2 View  Result Date: 06/07/2022 CLINICAL DATA:  Suspected sepsis. EXAM: CHEST - 2 VIEW COMPARISON:  None Available. FINDINGS: The heart is enlarged. Pulmonary vascular congestion without evidence of focal consolidation or  pulmonary edema. Low lung volumes. Thoracic spondylosis. No acute osseous abnormality. IMPRESSION: Cardiomegaly with pulmonary vascular congestion. No evidence of focal consolidation, pleural effusion or frank pulmonary edema. Electronically Signed   By: Larose Hires D.O.   On: 06/07/2022 09:40    Microbiology: Results for orders placed or performed during the hospital encounter of 06/07/22  Culture, blood (Routine x 2)     Status: None (Preliminary result)   Collection Time: 06/07/22  9:45 AM   Specimen: BLOOD  Result Value Ref Range Status   Specimen Description BLOOD LEFT [facility]  Final   Special Requests   Final    BOTTLES DRAWN AEROBIC AND ANAEROBIC Blood Culture adequate volume   Culture   Final    NO GROWTH 3 DAYS Performed at [facility], [address], [place], [place] [address]    Report Status PENDING  Incomplete  Culture, blood (Routine x 2)     Status: None (Preliminary result)   Collection Time: 06/07/22  9:45 AM   Specimen: BLOOD  Result Value Ref Range Status   Specimen Description BLOOD LEFT HAND  Final   Special Requests   Final    BOTTLES DRAWN AEROBIC AND ANAEROBIC Blood Culture adequate volume   Culture   Final    NO GROWTH 3 DAYS Performed at [facility], [address], [place], [place] [address]    Report Status PENDING  Incomplete  C Difficile Quick Screen w PCR reflex     Status: None   Collection Time: 06/09/22  8:00 AM   Specimen: STOOL  Result Value Ref Range Status   C Diff antigen NEGATIVE NEGATIVE Final   C Diff toxin NEGATIVE NEGATIVE Final   C Diff interpretation No C. difficile detected.  Final    Comment: Performed at [facility], [address] [place], [place] [address]  Gastrointestinal Panel by PCR , Stool     Status: None   Collection Time: 06/09/22  8:00 AM   Specimen: Stool  Result Value Ref Range Status   Campylobacter species NOT DETECTED NOT DETECTED Final   Plesimonas shigelloides NOT DETECTED NOT  DETECTED Final   Salmonella species NOT DETECTED NOT DETECTED Final   Yersinia enterocolitica NOT DETECTED NOT DETECTED Final   Vibrio species NOT DETECTED NOT DETECTED Final   Vibrio cholerae NOT DETECTED NOT DETECTED Final   Enteroaggregative E coli (EAEC) NOT DETECTED NOT DETECTED Final   Enteropathogenic E coli (EPEC) NOT DETECTED NOT DETECTED Final   Enterotoxigenic E coli (ETEC) NOT DETECTED NOT DETECTED Final   Shiga like toxin producing E coli (STEC) NOT DETECTED NOT DETECTED Final   Shigella/Enteroinvasive E coli (EIEC) NOT DETECTED NOT DETECTED Final   Cryptosporidium NOT DETECTED NOT DETECTED Final   Cyclospora cayetanensis NOT DETECTED NOT DETECTED Final   Entamoeba histolytica NOT DETECTED NOT DETECTED Final   Giardia lamblia NOT DETECTED NOT DETECTED Final   Adenovirus F40/41 NOT DETECTED NOT DETECTED Final   Astrovirus NOT DETECTED NOT DETECTED Final   Norovirus GI/GII NOT DETECTED NOT DETECTED Final   Rotavirus A NOT DETECTED NOT DETECTED Final   Sapovirus (I, II, IV, and V) NOT DETECTED NOT DETECTED Final    Comment: Performed  at [facility], [address] [place], [place] [address]    Labs: CBC: Recent Labs  Lab 06/07/22 [id number] 06/08/22 0542 06/09/22 0539 06/10/22 0344  WBC 22.1* 18.5* 15.2* 11.0*  NEUTROABS 16.6*  --  10.6* 6.8  HGB 15.8* 12.8 12.6 11.7*  HCT 46.9* 38.3 37.9 35.6*  MCV 94.9 96.7 97.4 98.3  PLT 196 124* 116* 136*   Basic Metabolic Panel: Recent Labs  Lab 06/07/22 0852 06/08/22 0542 06/09/22 0539 06/10/22 0344  NA 135 138 139 139  K 3.1* 3.4* 3.3* 3.2*  CL 103 106 107 105  CO2 21* 25 24 25   GLUCOSE 174* 97 92 101*  BUN 21 13 10 9   CREATININE 1.45* 1.17* 1.13* 1.13*  CALCIUM 8.8* 8.1* 8.2* 8.1*  MG 1.7  --  1.8 1.6*   Liver Function Tests: Recent Labs  Lab 06/07/22 0852 06/07/22 1433  AST 34  --   ALT 17  --   ALKPHOS 56  --   BILITOT 2.3* 1.6*  PROT 6.9  --   ALBUMIN 3.6  --    CBG: No results for  input(s): "GLUCAP" in the last 168 hours.  Discharge time spent: greater than 30 minutes.  Signed: Loyce Dys, MD [facility] 06/10/2022

## 2022-06-10 NOTE — Plan of Care (Signed)
  Problem: Health Behavior/Discharge Planning: Goal: Ability to manage health-related needs will improve Outcome: Progressing   Problem: Clinical Measurements: Goal: Diagnostic test results will improve Outcome: Progressing   Problem: Nutrition: Goal: Adequate nutrition will be maintained Outcome: Progressing   Problem: Pain Managment: Goal: General experience of comfort will improve Outcome: Progressing   

## 2022-06-10 NOTE — Progress Notes (Signed)
Maria Gutierrez to be discharged Home per MD order. Discussed prescriptions and follow up appointments with the patient. Prescriptions given to patient, medication list explained in detail. Patient verbalized understanding.  Allergies as of 06/10/2022   No Known Allergies      Medication List     STOP taking these medications    allopurinol 100 MG tablet Commonly known as: ZYLOPRIM   colchicine 0.6 MG tablet   GLUCOSAMINE 1500 COMPLEX PO   hydrochlorothiazide 25 MG tablet Commonly known as: HYDRODIURIL   predniSONE 10 MG tablet Commonly known as: DELTASONE       TAKE these medications    acetaminophen 325 MG tablet Commonly known as: TYLENOL Take 2 tablets (650 mg total) by mouth every 6 (six) hours as needed for mild pain or fever.   aspirin EC 81 MG tablet Take 81 mg by mouth daily.   calcium carbonate 600 MG Tabs tablet Commonly known as: OS-CAL Take 600 mg by mouth daily.   ciprofloxacin 500 MG tablet Commonly known as: Cipro Take 1 tablet (500 mg total) by mouth 2 (two) times daily for 5 days.   cyanocobalamin 1000 MCG tablet Take 1,000 mcg by mouth daily.   cyclobenzaprine 10 MG tablet Commonly known as: FLEXERIL 1/2-1 po qHS prn   fexofenadine 180 MG tablet Commonly known as: ALLEGRA Take 180 mg by mouth daily.   fluticasone 50 MCG/ACT nasal spray Commonly known as: Flonase Place 1 spray into both nostrils daily.   HYDROcodone-acetaminophen 7.5-325 MG tablet Commonly known as: NORCO Take 1 tablet by mouth 2 (two) times daily as needed for pain.   lansoprazole 15 MG capsule Commonly known as: PREVACID Take 15 mg by mouth every morning.   meclizine 25 MG tablet Commonly known as: ANTIVERT Take 25 mg by mouth 3 (three) times daily as needed for dizziness.   metoprolol succinate 50 MG 24 hr tablet Commonly known as: TOPROL-XL Take 50 mg by mouth every morning. Take with or immediately following a meal.   metroNIDAZOLE 500 MG tablet Commonly  known as: Flagyl Take 1 tablet (500 mg total) by mouth 3 (three) times daily for 5 days.   multivitamin with minerals Tabs tablet Take 1 tablet by mouth daily.   simvastatin 40 MG tablet Commonly known as: ZOCOR Take 40 mg by mouth at bedtime.   tamoxifen 20 MG tablet Commonly known as: NOLVADEX TAKE 1 TABLET BY MOUTH DAILY   triamterene-hydrochlorothiazide 37.5-25 MG tablet Commonly known as: MAXZIDE-25 Take 1 tablet by mouth daily.   venlafaxine XR 75 MG 24 hr capsule Commonly known as: EFFEXOR-XR TAKE 1 CAPSULE BY MOUTH DAILY  WITH BREAKFAST   Vitamin D (Ergocalciferol) 1.25 MG (50000 UNIT) Caps capsule Commonly known as: DRISDOL TAKE 1 CAPSULE BY MOUTH EVERY 7  DAYS TAKE ON MONDAYS        Vitals:   06/10/22 0410 06/10/22 0744  BP: (!) 102/52 116/68  Pulse: 85 88  Resp: 20 16  Temp: 98 F (36.7 C) 98.3 F (36.8 C)  SpO2: 97% 96%    Skin clean, dry and intact without evidence of skin break down and or skin tears. IV catheter discontinued intact. Site without signs and symptoms of complications. Dressing and pressure applied. Patient denies pain at this time. No complaints noted.  An After Visit Summary was printed and given to the patient. Patient escorted via wheelchair and discharged Home home via private auto.  Madie Reno, RN

## 2022-06-10 NOTE — Care Management Important Message (Signed)
Important Message  Patient Details  Name: Maria Gutierrez MRN: [id number] Date of Birth: [birth year]   Medicare Important Message Given:  Yes     [name] 06/10/2022, 12:04 PM

## 2022-06-12 LAB — CULTURE, BLOOD (ROUTINE X 2)
Culture: NO GROWTH
Culture: NO GROWTH
Special Requests: ADEQUATE
Special Requests: ADEQUATE

## 2022-06-21 ENCOUNTER — Other Ambulatory Visit: Payer: Self-pay | Admitting: Gastroenterology

## 2022-06-21 DIAGNOSIS — R109 Unspecified abdominal pain: Secondary | ICD-10-CM

## 2022-06-23 ENCOUNTER — Ambulatory Visit
Admission: RE | Admit: 2022-06-23 | Discharge: 2022-06-23 | Disposition: A | Discharge status: Home / Self Care | Payer: Medicare Other | Source: Ambulatory Visit | Attending: Gastroenterology | Admitting: Gastroenterology

## 2022-06-23 DIAGNOSIS — R109 Unspecified abdominal pain: Secondary | ICD-10-CM | POA: Insufficient documentation

## 2022-06-23 MED ORDER — IOHEXOL 300 MG/ML  SOLN
100.0000 mL | Freq: Once | INTRAMUSCULAR | Status: AC | PRN
  Administered 2022-06-23: 100 mL via INTRAVENOUS

## 2022-07-30 ENCOUNTER — Inpatient Hospital Stay
Admission: EM | Admit: 2022-07-30 | Discharge: 2022-08-05 | DRG: 853 | Disposition: A | Discharge status: Home / Self Care | Payer: Medicare Other | Attending: Internal Medicine | Admitting: Internal Medicine

## 2022-07-30 ENCOUNTER — Emergency Department: Payer: Medicare Other

## 2022-07-30 ENCOUNTER — Other Ambulatory Visit: Payer: Self-pay

## 2022-07-30 DIAGNOSIS — Z853 Personal history of malignant neoplasm of breast: Secondary | ICD-10-CM

## 2022-07-30 DIAGNOSIS — Z6841 Body Mass Index (BMI) 40.0 and over, adult: Secondary | ICD-10-CM

## 2022-07-30 DIAGNOSIS — K219 Gastro-esophageal reflux disease without esophagitis: Secondary | ICD-10-CM | POA: Diagnosis present

## 2022-07-30 DIAGNOSIS — A419 Sepsis, unspecified organism: Principal | ICD-10-CM | POA: Diagnosis present

## 2022-07-30 DIAGNOSIS — I4719 Other supraventricular tachycardia: Secondary | ICD-10-CM | POA: Diagnosis present

## 2022-07-30 DIAGNOSIS — R0603 Acute respiratory distress: Secondary | ICD-10-CM

## 2022-07-30 DIAGNOSIS — Z170 Estrogen receptor positive status [ER+]: Secondary | ICD-10-CM

## 2022-07-30 DIAGNOSIS — J189 Pneumonia, unspecified organism: Secondary | ICD-10-CM | POA: Diagnosis present

## 2022-07-30 DIAGNOSIS — I2694 Multiple subsegmental pulmonary emboli without acute cor pulmonale: Secondary | ICD-10-CM | POA: Diagnosis not present

## 2022-07-30 DIAGNOSIS — Z7901 Long term (current) use of anticoagulants: Secondary | ICD-10-CM | POA: Diagnosis not present

## 2022-07-30 DIAGNOSIS — Y95 Nosocomial condition: Secondary | ICD-10-CM | POA: Diagnosis present

## 2022-07-30 DIAGNOSIS — N1831 Chronic kidney disease, stage 3a: Secondary | ICD-10-CM | POA: Diagnosis present

## 2022-07-30 DIAGNOSIS — E785 Hyperlipidemia, unspecified: Secondary | ICD-10-CM | POA: Diagnosis present

## 2022-07-30 DIAGNOSIS — I129 Hypertensive chronic kidney disease with stage 1 through stage 4 chronic kidney disease, or unspecified chronic kidney disease: Secondary | ICD-10-CM | POA: Diagnosis present

## 2022-07-30 DIAGNOSIS — Z8249 Family history of ischemic heart disease and other diseases of the circulatory system: Secondary | ICD-10-CM

## 2022-07-30 DIAGNOSIS — C50811 Malignant neoplasm of overlapping sites of right female breast: Secondary | ICD-10-CM

## 2022-07-30 DIAGNOSIS — I50810 Right heart failure, unspecified: Secondary | ICD-10-CM | POA: Diagnosis not present

## 2022-07-30 DIAGNOSIS — Z7982 Long term (current) use of aspirin: Secondary | ICD-10-CM

## 2022-07-30 DIAGNOSIS — I959 Hypotension, unspecified: Secondary | ICD-10-CM | POA: Diagnosis present

## 2022-07-30 DIAGNOSIS — Z90710 Acquired absence of both cervix and uterus: Secondary | ICD-10-CM

## 2022-07-30 DIAGNOSIS — N179 Acute kidney failure, unspecified: Secondary | ICD-10-CM | POA: Diagnosis present

## 2022-07-30 DIAGNOSIS — I4891 Unspecified atrial fibrillation: Secondary | ICD-10-CM | POA: Diagnosis present

## 2022-07-30 DIAGNOSIS — Z9221 Personal history of antineoplastic chemotherapy: Secondary | ICD-10-CM | POA: Diagnosis not present

## 2022-07-30 DIAGNOSIS — I2489 Other forms of acute ischemic heart disease: Secondary | ICD-10-CM | POA: Diagnosis present

## 2022-07-30 DIAGNOSIS — I272 Pulmonary hypertension, unspecified: Secondary | ICD-10-CM | POA: Diagnosis present

## 2022-07-30 DIAGNOSIS — J9601 Acute respiratory failure with hypoxia: Secondary | ICD-10-CM | POA: Diagnosis present

## 2022-07-30 DIAGNOSIS — W010XXA Fall on same level from slipping, tripping and stumbling without subsequent striking against object, initial encounter: Secondary | ICD-10-CM | POA: Diagnosis present

## 2022-07-30 DIAGNOSIS — F32A Depression, unspecified: Secondary | ICD-10-CM | POA: Diagnosis present

## 2022-07-30 DIAGNOSIS — I10 Essential (primary) hypertension: Secondary | ICD-10-CM | POA: Diagnosis not present

## 2022-07-30 DIAGNOSIS — R6520 Severe sepsis without septic shock: Secondary | ICD-10-CM | POA: Diagnosis present

## 2022-07-30 DIAGNOSIS — I509 Heart failure, unspecified: Secondary | ICD-10-CM | POA: Diagnosis not present

## 2022-07-30 DIAGNOSIS — E876 Hypokalemia: Secondary | ICD-10-CM | POA: Diagnosis present

## 2022-07-30 DIAGNOSIS — R000 Tachycardia, unspecified: Secondary | ICD-10-CM

## 2022-07-30 DIAGNOSIS — F419 Anxiety disorder, unspecified: Secondary | ICD-10-CM | POA: Diagnosis present

## 2022-07-30 DIAGNOSIS — D696 Thrombocytopenia, unspecified: Secondary | ICD-10-CM | POA: Diagnosis present

## 2022-07-30 DIAGNOSIS — Z9049 Acquired absence of other specified parts of digestive tract: Secondary | ICD-10-CM

## 2022-07-30 DIAGNOSIS — Z79899 Other long term (current) drug therapy: Secondary | ICD-10-CM | POA: Diagnosis not present

## 2022-07-30 DIAGNOSIS — I2699 Other pulmonary embolism without acute cor pulmonale: Secondary | ICD-10-CM | POA: Diagnosis present

## 2022-07-30 DIAGNOSIS — Z923 Personal history of irradiation: Secondary | ICD-10-CM

## 2022-07-30 DIAGNOSIS — R778 Other specified abnormalities of plasma proteins: Secondary | ICD-10-CM | POA: Diagnosis not present

## 2022-07-30 LAB — BASIC METABOLIC PANEL
Anion gap: 11 (ref 5–15)
BUN: 20 mg/dL (ref 8–23)
CO2: 22 mmol/L (ref 22–32)
Calcium: 8.7 mg/dL — ABNORMAL LOW (ref 8.9–10.3)
Chloride: 100 mmol/L (ref 98–111)
Creatinine, Ser: 1.6 mg/dL — ABNORMAL HIGH (ref 0.44–1.00)
GFR, Estimated: 32 mL/min — ABNORMAL LOW (ref >60)
Glucose, Bld: 157 mg/dL — ABNORMAL HIGH (ref 70–99)
Potassium: 3 mmol/L — ABNORMAL LOW (ref 3.5–5.1)
Sodium: 133 mmol/L — ABNORMAL LOW (ref 135–145)

## 2022-07-30 LAB — CBC
HCT: 39.7 % (ref 36.0–46.0)
Hemoglobin: 13.3 g/dL (ref 12.0–15.0)
MCH: 31.9 pg (ref 26.0–34.0)
MCHC: 33.5 g/dL (ref 30.0–36.0)
MCV: 95.2 fL (ref 80.0–100.0)
Platelets: 148 10*3/uL — ABNORMAL LOW (ref 150–400)
RBC: 4.17 MIL/uL (ref 3.87–5.11)
RDW: 12.4 % (ref 11.5–15.5)
WBC: 19 10*3/uL — ABNORMAL HIGH (ref 4.0–10.5)
nRBC: 0 % (ref 0.0–0.2)

## 2022-07-30 LAB — HEPATIC FUNCTION PANEL
ALT: 15 U/L (ref 0–44)
AST: 34 U/L (ref 15–41)
Albumin: 3.4 g/dL — ABNORMAL LOW (ref 3.5–5.0)
Alkaline Phosphatase: 54 U/L (ref 38–126)
Bilirubin, Direct: 0.4 mg/dL — ABNORMAL HIGH (ref 0.0–0.2)
Indirect Bilirubin: 1.1 mg/dL — ABNORMAL HIGH (ref 0.3–0.9)
Total Bilirubin: 1.5 mg/dL — ABNORMAL HIGH (ref 0.3–1.2)
Total Protein: 6.9 g/dL (ref 6.5–8.1)

## 2022-07-30 LAB — LACTIC ACID, PLASMA: Lactic Acid, Venous: 1.6 mmol/L (ref 0.5–1.9)

## 2022-07-30 LAB — PROTIME-INR
INR: 1.1 (ref 0.8–1.2)
Prothrombin Time: 14.7 seconds (ref 11.4–15.2)

## 2022-07-30 LAB — BRAIN NATRIURETIC PEPTIDE: B Natriuretic Peptide: 158.9 pg/mL — ABNORMAL HIGH (ref 0.0–100.0)

## 2022-07-30 LAB — APTT: aPTT: 31 seconds (ref 24–36)

## 2022-07-30 MED ORDER — SODIUM CHLORIDE 0.9 % IV SOLN
500.0000 mg | Freq: Once | INTRAVENOUS | Status: AC
  Administered 2022-07-30: 500 mg via INTRAVENOUS
  Filled 2022-07-30: qty 5

## 2022-07-30 MED ORDER — IOHEXOL 350 MG/ML SOLN
75.0000 mL | Freq: Once | INTRAVENOUS | Status: AC | PRN
  Administered 2022-07-30: 75 mL via INTRAVENOUS

## 2022-07-30 MED ORDER — HEPARIN (PORCINE) 25000 UT/250ML-% IV SOLN
1800.0000 [IU]/h | INTRAVENOUS | Status: DC
  Administered 2022-07-30: 1300 [IU]/h via INTRAVENOUS
  Administered 2022-08-01 – 2022-08-02 (×3): 1800 [IU]/h via INTRAVENOUS
  Filled 2022-07-30 (×5): qty 250

## 2022-07-30 MED ORDER — SODIUM CHLORIDE 0.9 % IV BOLUS
1000.0000 mL | Freq: Once | INTRAVENOUS | Status: AC
  Administered 2022-07-30: 1000 mL via INTRAVENOUS

## 2022-07-30 MED ORDER — DILTIAZEM HCL-DEXTROSE 125-5 MG/125ML-% IV SOLN (PREMIX)
5.0000 mg/h | INTRAVENOUS | Status: DC
  Administered 2022-07-30 – 2022-07-31 (×2): 5 mg/h via INTRAVENOUS
  Administered 2022-08-01 (×3): 15 mg/h via INTRAVENOUS
  Filled 2022-07-30 (×5): qty 125

## 2022-07-30 MED ORDER — SODIUM CHLORIDE 0.9 % IV SOLN
1.0000 g | Freq: Once | INTRAVENOUS | Status: AC
  Administered 2022-07-30: 1 g via INTRAVENOUS
  Filled 2022-07-30: qty 10

## 2022-07-30 MED ORDER — DILTIAZEM HCL 25 MG/5ML IV SOLN
10.0000 mg | Freq: Once | INTRAVENOUS | Status: AC
  Administered 2022-07-30: 10 mg via INTRAVENOUS
  Filled 2022-07-30: qty 5

## 2022-07-30 MED ORDER — HEPARIN BOLUS VIA INFUSION
5500.0000 [IU] | Freq: Once | INTRAVENOUS | Status: AC
  Administered 2022-07-30: 5500 [IU] via INTRAVENOUS
  Filled 2022-07-30: qty 5500

## 2022-07-30 NOTE — ED Triage Notes (Signed)
Pt to ed from home POV for SOB. Pt has increased RR in triage. Pt has been having a fever and cold chills. Pt is CAOx4.

## 2022-07-30 NOTE — ED Provider Notes (Signed)
[facility] Provider Note   Event Date/Time   First MD Initiated Contact with Patient 07/30/22 2058     (approximate) History  Shortness of Breath  HPI Maria Gutierrez is a 82 y.o. female with a stated past medical history of hyperlipidemia, hypertension, and obesity who presents complaining of shortness of breath with increased respiratory rate and subjective fever/chills that has been worsening over the past 3 days.  Patient does endorse productive cough over this time as well. ROS: Patient currently denies any vision changes, tinnitus, difficulty speaking, facial droop, sore throat, chest pain, abdominal pain, nausea/vomiting/diarrhea, dysuria, or weakness/numbness/paresthesias in any extremity   Physical Exam  Triage Vital Signs: ED Triage Vitals  Enc Vitals Group     BP 07/30/22 2057 (!) 117/104     Pulse Rate 07/30/22 2056 (!) 150     Resp 07/30/22 2056 (!) 30     Temp 07/30/22 2056 100 F (37.8 C)     Temp Source 07/30/22 2056 Oral     SpO2 07/30/22 2056 92 %     Weight 07/30/22 2054 251 lb 5.2 oz (114 kg)     Height 07/30/22 2054 5' 2.5" (1.588 m)     Head Circumference --      Peak Flow --      Pain Score 07/30/22 2052 0     Pain Loc --      Pain Edu? --      Excl. in GC? --    Most recent vital signs: Vitals:   07/30/22 2230 07/30/22 2330  BP: (!) 170/150 (!) 71/47  Pulse: (!) 106 (!) 116  Resp: (!) 28 (!) 23  Temp:    SpO2: 94% 94%   General: Awake, oriented x4. CV:  Good peripheral perfusion.  Resp:  Increased effort.  Bilateral rhonchi Abd:  No distention.  Other:  Morbidly obese elderly Caucasian female laying in bed in moderate respiratory distress ED Results / Procedures / Treatments  Labs (all labs ordered are listed, but only abnormal results are displayed) Labs Reviewed  CBC - Abnormal; Notable for the following components:      Result Value   WBC 19.0 (*)    Platelets 148 (*)    All other components within normal limits   BASIC METABOLIC PANEL - Abnormal; Notable for the following components:   Sodium 133 (*)    Potassium 3.0 (*)    Glucose, Bld 157 (*)    Creatinine, Ser 1.60 (*)    Calcium 8.7 (*)    GFR, Estimated 32 (*)    All other components within normal limits  BRAIN NATRIURETIC PEPTIDE - Abnormal; Notable for the following components:   B Natriuretic Peptide 158.9 (*)    All other components within normal limits  HEPATIC FUNCTION PANEL - Abnormal; Notable for the following components:   Albumin 3.4 (*)    Total Bilirubin 1.5 (*)    Bilirubin, Direct 0.4 (*)    Indirect Bilirubin 1.1 (*)    All other components within normal limits  CULTURE, BLOOD (ROUTINE X 2)  CULTURE, BLOOD (ROUTINE X 2)  LACTIC ACID, PLASMA  LACTIC ACID, PLASMA  APTT  PROTIME-INR   EKG ED ECG REPORT I, Merwyn Katos, the attending physician, personally viewed and interpreted this ECG. Date: 07/30/2022 EKG Time: 2055 Rate: 143 Rhythm: Atrial fibrillation with rapid ventricular response QRS Axis: normal Intervals: normal ST/T Wave abnormalities: normal Narrative Interpretation: Atrial fibrillation with rapid ventricular response.  No evidence  of acute ischemia RADIOLOGY ED MD interpretation: CT angiography of the chest interpreted independently by me and shows bilateral segmental pulmonary emboli involving the right middle lobe, right lower lobe, and left lower lobe consistent with a submassive pulmonary embolism.  Patient also has peripheral interstitial opacities concerning for pneumonia  Single view portable chest x-ray interpreted independently by me and shows a focal opacity in the left midlung likely developing infiltrate -Agree with radiology assessment Official radiology report(s): CT Angio Chest PE W/Cm &/Or Wo Cm  Result Date: 07/30/2022 CLINICAL DATA:  High probability for PE.  Shortness of breath. EXAM: CT ANGIOGRAPHY CHEST WITH CONTRAST TECHNIQUE: Multidetector CT imaging of the chest was performed  using the standard protocol during bolus administration of intravenous contrast. Multiplanar CT image reconstructions and MIPs were obtained to evaluate the vascular anatomy. RADIATION DOSE REDUCTION: This exam was performed according to the departmental dose-optimization program which includes automated exposure control, adjustment of the mA and/or kV according to patient size and/or use of iterative reconstruction technique. CONTRAST:  75mL OMNIPAQUE IOHEXOL 350 MG/ML SOLN COMPARISON:  CT abdomen and pelvis 06/23/2022. FINDINGS: Cardiovascular: There is adequate opacification of the pulmonary arteries. There are segmental right lower lobe and right middle lobe pulmonary emboli. There also segmental left lower lobe pulmonary emboli. Heart is normal in size. There is no pericardial effusion. Aorta is normal in size. There are atherosclerotic calcifications of the aorta. Mediastinum/Nodes: No enlarged mediastinal, hilar, or axillary lymph nodes. Thyroid gland, trachea, and esophagus demonstrate no significant findings. Lungs/Pleura: There are minimal scattered ground-glass and peripheral interstitial opacities throughout both lungs. There is no pleural effusion or pneumothorax. Trachea and central airways are patent. Upper Abdomen: No acute abnormality. Left renal cysts partially imaged. Cholecystectomy clips are present. Musculoskeletal: Vertebroplasty changes are seen at T8. Review of the MIP images confirms the above findings. IMPRESSION: 1. Bilateral segmental pulmonary emboli involving the right middle lobe, right lower lobe and left lower lobe. Positive for acute PE with CTevidence of right heart strain (RV/LV Ratio = 1.1) consistent with at least submassive (intermediate risk) PE. The presence of right heart strain has been associated with an increased risk of morbidity and mortality. 2. Minimal scattered ground-glass and peripheral interstitial opacities throughout both lungs, likely infectious/inflammatory.  Aortic Atherosclerosis (ICD10-I70.0). These results were called by telephone at the time of interpretation on 07/30/2022 at 11:16 pm to provider [facility] , who verbally acknowledged these results. Electronically Signed   By: Darliss Cheney M.D.   On: 07/30/2022 23:16   DG Chest Port 1 View  Result Date: 07/30/2022 CLINICAL DATA:  Shortness of breath.  Fever EXAM: PORTABLE CHEST 1 VIEW COMPARISON:  June 07, 2022 FINDINGS: No pneumothorax. No pneumothorax. The cardiomediastinal silhouette is stable. Bilateral increased lung markings are largely stable. Mildly more focal opacity in the left mid lung. The retrocardiac region on the left is not well assessed due to lack of lateral imaging. No other abnormalities. IMPRESSION: 1. Focal opacity in left mid lung could represent developing infiltrate/pneumonia given history. Recommend short-term follow-up imaging to ensure resolution. 2. Increase interstitial markings bilaterally are stable. This could represent a chronic process given stability, recurrent pulmonary edema, or an atypical infectious process. Electronically Signed   By: Gerome Sam III M.D.   On: 07/30/2022 21:36   PROCEDURES: Critical Care performed: Yes, see critical care procedure note(s) .1-3 Lead EKG Interpretation  Performed by: Merwyn Katos, MD Authorized by: Merwyn Katos, MD     Interpretation: abnormal  ECG rate:  113   ECG rate assessment: tachycardic     Rhythm: atrial fibrillation     Ectopy: none     Conduction: normal   CRITICAL CARE Performed by: Merwyn Katos  Total critical care time: 43 minutes  Critical care time was exclusive of separately billable procedures and treating other patients.  Critical care was necessary to treat or prevent imminent or life-threatening deterioration.  Critical care was time spent personally by me on the following activities: development of treatment plan with patient and/or surrogate as well as nursing, discussions with  consultants, evaluation of patient's response to treatment, examination of patient, obtaining history from patient or surrogate, ordering and performing treatments and interventions, ordering and review of laboratory studies, ordering and review of radiographic studies, pulse oximetry and re-evaluation of patient's condition.  MEDICATIONS ORDERED IN ED: Medications  diltiazem (CARDIZEM) 125 mg in dextrose 5% 125 mL (1 mg/mL) infusion (5 mg/hr Intravenous Rate/Dose Change 07/30/22 2339)  azithromycin (ZITHROMAX) 500 mg in sodium chloride 0.9 % 250 mL IVPB (500 mg Intravenous New Bag/Given 07/30/22 2304)  heparin bolus via infusion 5,500 Units (has no administration in time range)  heparin ADULT infusion 100 units/mL (25000 units/217mL) (has no administration in time range)  sodium chloride 0.9 % bolus 1,000 mL (0 mLs Intravenous Stopped 07/30/22 2149)  diltiazem (CARDIZEM) injection 10 mg (10 mg Intravenous Given 07/30/22 2127)  cefTRIAXone (ROCEPHIN) 1 g in sodium chloride 0.9 % 100 mL IVPB (0 g Intravenous Stopped 07/30/22 2240)  iohexol (OMNIPAQUE) 350 MG/ML injection 75 mL (75 mLs Intravenous Contrast Given 07/30/22 2244)   IMPRESSION / MDM / ASSESSMENT AND PLAN / ED COURSE  I reviewed the triage vital signs and the nursing notes.                             The patient is on the cardiac monitor to evaluate for evidence of arrhythmia and/or significant heart rate changes. Patient's presentation is most consistent with acute presentation with potential threat to life or bodily function. + atrial fibrillation w/ RVR DDx: Pneumothorax, Pneumonia, Pulmonary Embolus, Tamponade, ACS, Thyrotoxicosis.  No history or evidence decompensated heart failure. Given their history and exam it is likely this patient is unlikely to spontaneously revert to a rate controlled rhythm and necessitates a thorough workup for their arrhythmia. Workup: ECG, CXR, CBC, BMP, UA, Troponin, BNP, TSH, Ca-Mag-Phos Interventions: Defer  Cardioversion (uncertain historical reliability with time of onset, increased risk of thromboembolic stroke).  Start diltiazem bolus and drip.  Heparin started empirically CT PE shows bilateral pulmonary emboli  Disposition: Admit   FINAL CLINICAL IMPRESSION(S) / ED DIAGNOSES   Final diagnoses:  Atrial fibrillation with rapid ventricular response (HCC)  Multiple subsegmental pulmonary emboli without acute cor pulmonale (HCC)  Acute respiratory distress  Community acquired pneumonia, unspecified laterality   Rx / DC Orders   ED Discharge Orders     None      Note:  This document was prepared using Dragon voice recognition software and may include unintentional dictation errors.   Merwyn Katos, MD 07/30/22 [phone]

## 2022-07-30 NOTE — ED Notes (Signed)
Pt is asymptomatic of low BP. Cardizem adjusted

## 2022-07-30 NOTE — Progress Notes (Signed)
ANTICOAGULATION CONSULT NOTE  Pharmacy Consult for heparin infusion Indication: pulmonary embolus  No Known Allergies  Patient Measurements: Height: 5' 2.5" (158.8 cm) Weight: 114 kg (251 lb 5.2 oz) IBW/kg (Calculated) : 51.25 Heparin Dosing Weight: 79 kg  Vital Signs: Temp: 100 F (37.8 C) (06/01 2056) Temp Source: Oral (06/01 2056) BP: 174/111 (06/01 2200) Pulse Rate: 113 (06/01 2200)  Labs: Recent Labs    07/30/22 2053  HGB 13.3  HCT 39.7  PLT 148*  CREATININE 1.60*    Estimated Creatinine Clearance: 33.3 mL/min (A) (by C-G formula based on SCr of 1.6 mg/dL (H)).   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 extensive intermediate grade DCIS.    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   DDD (degenerative disc disease), lumbar    Dyspnea    GERD (gastroesophageal reflux disease)    Heart murmur    History of blood transfusion    Hyperlipidemia    Hypertension    Personal history of chemotherapy 2016   right breast ca   Personal history of radiation therapy 2016   mammosite   SOB (shortness of breath) 12/03/2014    Assessment: Pt is a 82 yo female presenting to ED for SOB, found with " bilateral segmental pulmonary emboli involving the right middle lobe, right lower lobe and left lower lobe."  Goal of Therapy:  Heparin level 0.3-0.7 units/ml Monitor platelets by anticoagulation protocol: Yes   Plan:  Bolus 5500 units x 1 Start heparin infusion at 1300 units/hr Will check HL in 8 hr after start of infusion CBC daily while on heparin  Otelia Sergeant, PharmD, [facility] 07/30/2022 11:32 PM

## 2022-07-31 ENCOUNTER — Inpatient Hospital Stay: Admit: 2022-07-31 | Payer: Medicare Other

## 2022-07-31 ENCOUNTER — Inpatient Hospital Stay: Payer: Medicare Other

## 2022-07-31 DIAGNOSIS — R778 Other specified abnormalities of plasma proteins: Secondary | ICD-10-CM

## 2022-07-31 DIAGNOSIS — R0603 Acute respiratory distress: Secondary | ICD-10-CM | POA: Diagnosis not present

## 2022-07-31 DIAGNOSIS — I2699 Other pulmonary embolism without acute cor pulmonale: Secondary | ICD-10-CM | POA: Diagnosis present

## 2022-07-31 DIAGNOSIS — J189 Pneumonia, unspecified organism: Secondary | ICD-10-CM | POA: Diagnosis not present

## 2022-07-31 DIAGNOSIS — R000 Tachycardia, unspecified: Secondary | ICD-10-CM

## 2022-07-31 DIAGNOSIS — I2694 Multiple subsegmental pulmonary emboli without acute cor pulmonale: Secondary | ICD-10-CM | POA: Diagnosis present

## 2022-07-31 DIAGNOSIS — I10 Essential (primary) hypertension: Secondary | ICD-10-CM

## 2022-07-31 DIAGNOSIS — E785 Hyperlipidemia, unspecified: Secondary | ICD-10-CM

## 2022-07-31 DIAGNOSIS — I959 Hypotension, unspecified: Secondary | ICD-10-CM

## 2022-07-31 LAB — BASIC METABOLIC PANEL
Anion gap: 7 (ref 5–15)
Anion gap: 8 (ref 5–15)
BUN: 17 mg/dL (ref 8–23)
BUN: 15 mg/dL (ref 8–23)
CO2: 27 mmol/L (ref 22–32)
CO2: 26 mmol/L (ref 22–32)
Calcium: 7.8 mg/dL — ABNORMAL LOW (ref 8.9–10.3)
Calcium: 8 mg/dL — ABNORMAL LOW (ref 8.9–10.3)
Chloride: 102 mmol/L (ref 98–111)
Chloride: 101 mmol/L (ref 98–111)
Creatinine, Ser: 1.44 mg/dL — ABNORMAL HIGH (ref 0.44–1.00)
Creatinine, Ser: 1.35 mg/dL — ABNORMAL HIGH (ref 0.44–1.00)
GFR, Estimated: 37 mL/min — ABNORMAL LOW (ref >60)
GFR, Estimated: 39 mL/min — ABNORMAL LOW (ref >60)
Glucose, Bld: 131 mg/dL — ABNORMAL HIGH (ref 70–99)
Glucose, Bld: 130 mg/dL — ABNORMAL HIGH (ref 70–99)
Potassium: 3.1 mmol/L — ABNORMAL LOW (ref 3.5–5.1)
Potassium: 3.1 mmol/L — ABNORMAL LOW (ref 3.5–5.1)
Sodium: 136 mmol/L (ref 135–145)
Sodium: 135 mmol/L (ref 135–145)

## 2022-07-31 LAB — CBC
HCT: 35.8 % — ABNORMAL LOW (ref 36.0–46.0)
Hemoglobin: 11.8 g/dL — ABNORMAL LOW (ref 12.0–15.0)
MCH: 32 pg (ref 26.0–34.0)
MCHC: 33 g/dL (ref 30.0–36.0)
MCV: 97 fL (ref 80.0–100.0)
Platelets: 136 10*3/uL — ABNORMAL LOW (ref 150–400)
RBC: 3.69 MIL/uL — ABNORMAL LOW (ref 3.87–5.11)
RDW: 12.5 % (ref 11.5–15.5)
WBC: 18.1 10*3/uL — ABNORMAL HIGH (ref 4.0–10.5)
nRBC: 0 % (ref 0.0–0.2)

## 2022-07-31 LAB — TROPONIN I (HIGH SENSITIVITY)
Troponin I (High Sensitivity): 24 ng/L — ABNORMAL HIGH (ref <18)
Troponin I (High Sensitivity): 25 ng/L — ABNORMAL HIGH (ref <18)

## 2022-07-31 LAB — CULTURE, BLOOD (ROUTINE X 2)

## 2022-07-31 LAB — PROCALCITONIN: Procalcitonin: 0.53 ng/mL

## 2022-07-31 LAB — GLUCOSE, CAPILLARY: Glucose-Capillary: 134 mg/dL — ABNORMAL HIGH (ref 70–99)

## 2022-07-31 LAB — HEPARIN LEVEL (UNFRACTIONATED)
Heparin Unfractionated: 0.17 IU/mL — ABNORMAL LOW (ref 0.30–0.70)
Heparin Unfractionated: <0.1 IU/mL — ABNORMAL LOW (ref 0.30–0.70)

## 2022-07-31 LAB — TSH: TSH: 5.07 u[IU]/mL — ABNORMAL HIGH (ref 0.350–4.500)

## 2022-07-31 LAB — MRSA NEXT GEN BY PCR, NASAL: MRSA by PCR Next Gen: NOT DETECTED

## 2022-07-31 LAB — MAGNESIUM
Magnesium: 1.2 mg/dL — ABNORMAL LOW (ref 1.7–2.4)
Magnesium: 2.1 mg/dL (ref 1.7–2.4)

## 2022-07-31 MED ORDER — ACETAMINOPHEN 325 MG PO TABS
650.0000 mg | ORAL_TABLET | Freq: Once | ORAL | Status: AC
  Administered 2022-07-31: 650 mg via ORAL
  Filled 2022-07-31: qty 2

## 2022-07-31 MED ORDER — ONDANSETRON HCL 4 MG/2ML IJ SOLN: 4.0000 mg | Freq: Four times a day (QID) | INTRAMUSCULAR | Status: DC | PRN

## 2022-07-31 MED ORDER — VANCOMYCIN HCL 1500 MG/300ML IV SOLN: 1500.0000 mg | INTRAVENOUS | Status: DC

## 2022-07-31 MED ORDER — VANCOMYCIN HCL 1250 MG/250ML IV SOLN
1250.0000 mg | Freq: Once | INTRAVENOUS | Status: AC
  Administered 2022-07-31: 1250 mg via INTRAVENOUS
  Filled 2022-07-31: qty 250

## 2022-07-31 MED ORDER — VENLAFAXINE HCL ER 75 MG PO CP24
75.0000 mg | ORAL_CAPSULE | Freq: Every day | ORAL | Status: DC
  Administered 2022-07-31 – 2022-08-05 (×6): 75 mg via ORAL
  Filled 2022-07-31 (×6): qty 1

## 2022-07-31 MED ORDER — IPRATROPIUM-ALBUTEROL 0.5-2.5 (3) MG/3ML IN SOLN: 3.0000 mL | Freq: Four times a day (QID) | RESPIRATORY_TRACT | Status: DC | PRN

## 2022-07-31 MED ORDER — ACETAMINOPHEN 325 MG PO TABS
650.0000 mg | ORAL_TABLET | Freq: Four times a day (QID) | ORAL | Status: DC | PRN
  Administered 2022-07-31 – 2022-08-04 (×8): 650 mg via ORAL
  Filled 2022-07-31 (×8): qty 2

## 2022-07-31 MED ORDER — SODIUM CHLORIDE 0.9 % IV BOLUS
500.0000 mL | Freq: Once | INTRAVENOUS | Status: AC
  Administered 2022-07-31: 500 mL via INTRAVENOUS

## 2022-07-31 MED ORDER — ONDANSETRON HCL 4 MG PO TABS: 4.0000 mg | ORAL_TABLET | Freq: Four times a day (QID) | ORAL | Status: DC | PRN

## 2022-07-31 MED ORDER — SODIUM CHLORIDE 0.9 % IV SOLN
2.0000 g | Freq: Two times a day (BID) | INTRAVENOUS | Status: DC
  Administered 2022-07-31 – 2022-08-04 (×9): 2 g via INTRAVENOUS
  Filled 2022-07-31 (×9): qty 12.5

## 2022-07-31 MED ORDER — LACTATED RINGERS IV SOLN: INTRAVENOUS | Status: DC

## 2022-07-31 MED ORDER — HEPARIN BOLUS VIA INFUSION
2400.0000 [IU] | Freq: Once | INTRAVENOUS | Status: AC
  Administered 2022-07-31: 2400 [IU] via INTRAVENOUS
  Filled 2022-07-31: qty 2400

## 2022-07-31 MED ORDER — LACTATED RINGERS IV SOLN
150.0000 mL/h | INTRAVENOUS | Status: DC
  Administered 2022-07-31: 150 mL/h via INTRAVENOUS

## 2022-07-31 MED ORDER — VANCOMYCIN HCL IN DEXTROSE 1-5 GM/200ML-% IV SOLN: 1000.0000 mg | Freq: Once | INTRAVENOUS | Status: DC

## 2022-07-31 MED ORDER — DILTIAZEM LOAD VIA INFUSION
15.0000 mg | INTRAVENOUS | Status: AC
  Administered 2022-07-31: 15 mg via INTRAVENOUS
  Filled 2022-07-31: qty 15

## 2022-07-31 MED ORDER — CEFEPIME HCL 2 G IV SOLR
2.0000 g | Freq: Once | INTRAVENOUS | Status: DC
Start: 2022-07-31 — End: 2022-07-31

## 2022-07-31 MED ORDER — ACETAMINOPHEN 650 MG RE SUPP: 650.0000 mg | Freq: Four times a day (QID) | RECTAL | Status: DC | PRN

## 2022-07-31 MED ORDER — TAMOXIFEN CITRATE 10 MG PO TABS
20.0000 mg | ORAL_TABLET | Freq: Every day | ORAL | Status: DC
  Administered 2022-07-31 – 2022-08-05 (×6): 20 mg via ORAL
  Filled 2022-07-31 (×6): qty 2

## 2022-07-31 MED ORDER — CHLORHEXIDINE GLUCONATE CLOTH 2 % EX PADS
6.0000 | MEDICATED_PAD | Freq: Every day | CUTANEOUS | Status: DC
  Administered 2022-08-01 – 2022-08-02 (×2): 6 via TOPICAL

## 2022-07-31 MED ORDER — VANCOMYCIN VARIABLE DOSE PER UNSTABLE RENAL FUNCTION (PHARMACIST DOSING): Status: DC

## 2022-07-31 MED ORDER — MAGNESIUM SULFATE 2 GM/50ML IV SOLN
2.0000 g | Freq: Once | INTRAVENOUS | Status: AC
  Administered 2022-07-31: 2 g via INTRAVENOUS
  Filled 2022-07-31: qty 50

## 2022-07-31 MED ORDER — POTASSIUM CHLORIDE CRYS ER 20 MEQ PO TBCR
20.0000 meq | EXTENDED_RELEASE_TABLET | Freq: Once | ORAL | Status: AC
  Administered 2022-07-31: 20 meq via ORAL
  Filled 2022-07-31: qty 1

## 2022-07-31 MED ORDER — SODIUM CHLORIDE 0.9 % IV SOLN
500.0000 mg | INTRAVENOUS | Status: DC
  Administered 2022-07-31 – 2022-08-02 (×3): 500 mg via INTRAVENOUS
  Filled 2022-07-31 (×3): qty 5

## 2022-07-31 NOTE — ED Notes (Signed)
Pt's BP continues to change drastically.  Her cardizem was lowered according to Dr. Para March, but was shut off when her pressures dropped more. Dr. Para March was made aware via secured chat. Pt is otherwise asymptomatic.

## 2022-07-31 NOTE — ED Notes (Signed)
Spoke with Dr. Para March that told this RN to drop the Cardizem down to 2.5mg .  medication decreased per verbal order.

## 2022-07-31 NOTE — H&P (Addendum)
History and Physical    Patient: Maria Gutierrez:[id number] DOB: 06/02/1940 DOA: 07/30/2022 DOS: the patient was seen and examined on 07/31/2022 PCP: Jerl Mina, MD  Patient coming from: Home  Chief Complaint:  Chief Complaint  Patient presents with   Shortness of Breath    HPI: Maria Gutierrez is a 82 y.o. female with medical history significant for hypertension, class III obesity, BMI 40-49.9, depression with anxiety, CKD-3a, breast cancer (s/p of radiation and chemotherapy 2016), gout, recently hospitalized from 06/07/2022 to 06/10/2022 with severe sepsis secondary to acute colitis, who presents to theED with shortness of breath, cough congestion, fever and chills. ED course and data review: Tmax 100, pulse of the 150, respirations up to the mid 30s with O2 sat low 90s for which she was placed on O2 at 2 L.   Labs with WBC 19,000 and lactic acid of 1.6.  Potassium 3, sodium 133, creatinine 1.6 up from baseline of 1.13 BNP 158. EKG, personally viewed and interpreted showing sinus arrhythmia at 123 CTA chest significant for bilateral segmental pulmonary emboli with CT evidence of right heart strain consistent with at least submassive PE as well as pneumonia IMPRESSION: 1. Bilateral segmental pulmonary emboli involving the right middle lobe, right lower lobe and left lower lobe. Positive for acute PE with CTevidence of right heart strain (RV/LV Ratio = 1.1) consistent with at least submassive (intermediate risk) PE. The presence of right heart strain has been associated with an increased risk of morbidity and mortality. 2. Minimal scattered ground-glass and peripheral interstitial opacities throughout both lungs, likely infectious/inflammatory.  Patient started on a heparin drip Rocephin and ceftriaxone She was started on a diltiazem infusion for possible new onset A-fib but had a drop in blood pressure to 71/47 which improved after dialing back infusion. At the time of request for  admission heart rate 130 and BP 112/75. EKG showing fast sinus arrhythmia versus A-fib. Hospitalist consulted for admission.     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 extensive intermediate grade DCIS.    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   DDD (degenerative disc disease), lumbar    Dyspnea    GERD (gastroesophageal reflux disease)    Heart murmur    History of blood transfusion    Hyperlipidemia    Hypertension    Personal history of chemotherapy 2016   right breast ca   Personal history of radiation therapy 2016   mammosite   SOB (shortness of breath) 12/03/2014   Past Surgical History:  Procedure Laterality Date   ABDOMINAL HYSTERECTOMY     AXILLARY LYMPH NODE BIOPSY Right 07/14/2014   Procedure: AXILLARY LYMPH NODE BIOPSY;  Surgeon: Earline Mayotte, MD;  Location: [facility];  Service: General;  Laterality: Right;   BACK SURGERY  12/29/2020   BOTOX INJECTION N/A 01/13/2020   Procedure: BOTOX INJECTION;  Surgeon: Vanna Scotland, MD;  Location: [facility];  Service: Urology;  Laterality: N/A;   BREAST BIOPSY Left 2012   benign   BREAST BIOPSY Right 05/26/2014   DCIS    BREAST EXCISIONAL BIOPSY Right 06/28/2017    ULCERATED SKIN WITH UNDERLYING FAT NECROSIS, ACUTE INFLAMMATION AND REACTIVE EPITHELIAL  ATYPIA   BREAST LUMPECTOMY Right 06/10/2014   DCIS and Invasive ductal carcinoma, clear margins   BREAST SURGERY Right 06/10/2014   Wide excision for intermediate grade DCIS, identification of a 10  millimeter area of invasive mammary carcinoma.   CHOLECYSTECTOMY  2012   COLONOSCOPY  2009   EYE SURGERY Bilateral 2014   cataract   IR KYPHO EA ADDL LEVEL THORACIC OR LUMBAR  02/12/2021   JOINT  REPLACEMENT Left 2001   knee   JOINT REPLACEMENT Right 2003   knee   JOINT REPLACEMENT Left 2004   replacement joint broken   kneee surgery Left    PORT-A-CATH REMOVAL  02/11/2016   Dr Lemar Livings   PORTACATH PLACEMENT Left 08/27/2014   Procedure: INSERTION PORT-A-CATH;  Surgeon: Earline Mayotte, MD;  Location: [facility];  Service: General;  Laterality: Left;   Social History:  reports that she has never smoked. She has never used smokeless tobacco. She reports that she does not drink alcohol and does not use drugs.  No Known Allergies  Family History  Problem Relation Age of Onset   Heart disease Sister    Breast cancer Neg Hx     Prior to Admission medications   Medication Sig Start Date End Date Taking? Authorizing Provider  acetaminophen (TYLENOL) 325 MG tablet Take 2 tablets (650 mg total) by mouth every 6 (six) hours as needed for mild pain or fever. 06/10/22  Yes Loyce Dys, MD  aspirin EC 81 MG tablet Take 81 mg by mouth daily.   Yes [provider]  calcium carbonate (OS-CAL) 600 MG TABS tablet Take 600 mg by mouth daily.   Yes [provider]  cyanocobalamin 1000 MCG tablet Take 1,000 mcg by mouth daily.   Yes [provider]  cyclobenzaprine (FLEXERIL) 10 MG tablet 1/2-1 po qHS prn 01/03/17  Yes [provider]  HYDROcodone-acetaminophen (NORCO) 7.5-325 MG tablet Take 1 tablet by mouth 2 (two) times daily as needed for pain. 01/10/18  Yes [provider]  ketorolac (TORADOL) 10 MG tablet Take 10 mg by mouth every 6 (six) hours as needed. 07/20/22  Yes [provider]  Magnesium Oxide, Antacid, 500 MG CAPS Take 500 mg by mouth daily.   Yes [provider]  metoprolol succinate (TOPROL-XL) 50 MG 24 hr tablet Take 50 mg by mouth every morning. Take with or immediately following a meal.   Yes [provider]  Multiple Vitamin (MULTIVITAMIN WITH MINERALS) TABS tablet Take 1 tablet by mouth daily.   Yes  [provider]  pantoprazole (PROTONIX) 40 MG tablet Take 40 mg by mouth daily. 06/15/22 06/15/23 Yes [provider]  simvastatin (ZOCOR) 40 MG tablet Take 40 mg by mouth at bedtime.  12/25/17  Yes [provider]  tamoxifen (NOLVADEX) 20 MG tablet TAKE 1 TABLET BY MOUTH DAILY 05/16/22  Yes Earna Coder, MD  triamterene-hydrochlorothiazide (MAXZIDE-25) 37.5-25 MG tablet Take 1 tablet by mouth daily.   Yes [provider]  venlafaxine XR (EFFEXOR-XR) 75 MG 24 hr capsule TAKE 1 CAPSULE BY MOUTH DAILY  WITH BREAKFAST 03/29/22  Yes Earna Coder, MD  Vitamin D, Ergocalciferol, (DRISDOL) 1.25 MG (50000 UNIT) CAPS capsule TAKE 1 CAPSULE BY MOUTH EVERY 7  DAYS TAKE ON MONDAYS 04/18/22  Yes Earna Coder, MD  fluticasone (FLONASE) 50 MCG/ACT nasal spray Place 1 spray into both nostrils daily. Patient not taking: Reported on 07/30/2022 10/14/14   Johney Maine, MD    Physical Exam:  Vitals:   07/30/22 2230 07/30/22 2330 07/30/22 2354 07/31/22 0027  BP: (!) 170/150 (!) 71/47 104/78 112/75  Pulse: (!) 106 (!) 116 (!) 122 (!) 130  Resp: (!) 28 (!) 23 (!) 36 (!) 28  Temp:      TempSrc:      SpO2: 94% 94% 100% 96%  Weight:      Height:       Physical Exam Vitals and nursing note reviewed.  Constitutional:      Appearance: She is morbidly obese.     Comments: Patient very short of breath,Speaking in short sentences  HENT:     Head: Normocephalic and atraumatic.  Cardiovascular:     Rate and Rhythm: Regular rhythm. Tachycardia present.     Heart sounds: Normal heart sounds.  Pulmonary:     Effort: Tachypnea present.     Breath sounds: Wheezing present.  Abdominal:     Palpations: Abdomen is soft.     Tenderness: There is no abdominal tenderness.  Musculoskeletal:     Right lower leg: No edema.     Left lower leg: No edema.  Neurological:     Mental Status: Mental status is at baseline.     Labs on Admission: I have personally  reviewed following labs and imaging studies  CBC: Recent Labs  Lab 07/30/22 2053  WBC 19.0*  HGB 13.3  HCT 39.7  MCV 95.2  PLT 148*   Basic Metabolic Panel: Recent Labs  Lab 07/30/22 2053  NA 133*  K 3.0*  CL 100  CO2 22  GLUCOSE 157*  BUN 20  CREATININE 1.60*  CALCIUM 8.7*   GFR: Estimated Creatinine Clearance: 33.3 mL/min (A) (by C-G formula based on SCr of 1.6 mg/dL (H)). Liver Function Tests: Recent Labs  Lab 07/30/22 2053  AST 34  ALT 15  ALKPHOS 54  BILITOT 1.5*  PROT 6.9  ALBUMIN 3.4*   No results for input(s): "LIPASE", "AMYLASE" in the last 168 hours. No results for input(s): "AMMONIA" in the last 168 hours. Coagulation Profile: Recent Labs  Lab 07/30/22 2053  INR 1.1   Cardiac Enzymes: No results for input(s): "CKTOTAL", "CKMB", "CKMBINDEX", "TROPONINI" in the last 168 hours. BNP (last 3 results) No results for input(s): "PROBNP" in the last 8760 hours. HbA1C: No results for input(s): "HGBA1C" in the last 72 hours. CBG: No results for input(s): "GLUCAP" in the last 168 hours. Lipid Profile: No results for input(s): "CHOL", "HDL", "LDLCALC", "TRIG", "CHOLHDL", "LDLDIRECT" in the last 72 hours. Thyroid Function Tests: No results for input(s): "TSH", "T4TOTAL", "FREET4", "T3FREE", "THYROIDAB" in the last 72 hours. Anemia Panel: No results for input(s): "VITAMINB12", "FOLATE", "FERRITIN", "TIBC", "IRON", "RETICCTPCT" in the last 72 hours. Urine analysis:    Component Value Date/Time   COLORURINE YELLOW (A) 06/07/2022 0855   APPEARANCEUR HAZY (A) 06/07/2022 0855   APPEARANCEUR Clear 01/14/2021 1040   LABSPEC 1.017 06/07/2022 0855   PHURINE 6.0 06/07/2022 0855   GLUCOSEU NEGATIVE 06/07/2022 0855   HGBUR NEGATIVE 06/07/2022 0855   BILIRUBINUR NEGATIVE 06/07/2022 0855   BILIRUBINUR Negative 01/14/2021 1040   KETONESUR NEGATIVE 06/07/2022 0855   PROTEINUR NEGATIVE 06/07/2022 0855   UROBILINOGEN 0.2 11/21/2008 0930   NITRITE NEGATIVE  06/07/2022 0855   LEUKOCYTESUR NEGATIVE 06/07/2022 0855    Radiological Exams on Admission: CT Angio Chest PE W/Cm &/Or Wo Cm  Result Date: 07/30/2022 CLINICAL DATA:  High probability for PE.  Shortness of breath. EXAM: CT ANGIOGRAPHY CHEST WITH CONTRAST TECHNIQUE: Multidetector CT imaging of  the chest was performed using the standard protocol during bolus administration of intravenous contrast. Multiplanar CT image reconstructions and MIPs were obtained to evaluate the vascular anatomy. RADIATION DOSE REDUCTION: This exam was performed according to the departmental dose-optimization program which includes automated exposure control, adjustment of the mA and/or kV according to patient size and/or use of iterative reconstruction technique. CONTRAST:  75mL OMNIPAQUE IOHEXOL 350 MG/ML SOLN COMPARISON:  CT abdomen and pelvis 06/23/2022. FINDINGS: Cardiovascular: There is adequate opacification of the pulmonary arteries. There are segmental right lower lobe and right middle lobe pulmonary emboli. There also segmental left lower lobe pulmonary emboli. Heart is normal in size. There is no pericardial effusion. Aorta is normal in size. There are atherosclerotic calcifications of the aorta. Mediastinum/Nodes: No enlarged mediastinal, hilar, or axillary lymph nodes. Thyroid gland, trachea, and esophagus demonstrate no significant findings. Lungs/Pleura: There are minimal scattered ground-glass and peripheral interstitial opacities throughout both lungs. There is no pleural effusion or pneumothorax. Trachea and central airways are patent. Upper Abdomen: No acute abnormality. Left renal cysts partially imaged. Cholecystectomy clips are present. Musculoskeletal: Vertebroplasty changes are seen at T8. Review of the MIP images confirms the above findings. IMPRESSION: 1. Bilateral segmental pulmonary emboli involving the right middle lobe, right lower lobe and left lower lobe. Positive for acute PE with CTevidence of right  heart strain (RV/LV Ratio = 1.1) consistent with at least submassive (intermediate risk) PE. The presence of right heart strain has been associated with an increased risk of morbidity and mortality. 2. Minimal scattered ground-glass and peripheral interstitial opacities throughout both lungs, likely infectious/inflammatory. Aortic Atherosclerosis (ICD10-I70.0). These results were called by telephone at the time of interpretation on 07/30/2022 at 11:16 pm to provider [facility] , who verbally acknowledged these results. Electronically Signed   By: Darliss Cheney M.D.   On: 07/30/2022 23:16   DG Chest Port 1 View  Result Date: 07/30/2022 CLINICAL DATA:  Shortness of breath.  Fever EXAM: PORTABLE CHEST 1 VIEW COMPARISON:  June 07, 2022 FINDINGS: No pneumothorax. No pneumothorax. The cardiomediastinal silhouette is stable. Bilateral increased lung markings are largely stable. Mildly more focal opacity in the left mid lung. The retrocardiac region on the left is not well assessed due to lack of lateral imaging. No other abnormalities. IMPRESSION: 1. Focal opacity in left mid lung could represent developing infiltrate/pneumonia given history. Recommend short-term follow-up imaging to ensure resolution. 2. Increase interstitial markings bilaterally are stable. This could represent a chronic process given stability, recurrent pulmonary edema, or an atypical infectious process. Electronically Signed   By: Gerome Sam III M.D.   On: 07/30/2022 21:36     Data Reviewed: Relevant notes from primary care and specialist visits, past discharge summaries as available in EHR, including Care Everywhere. Prior diagnostic testing as pertinent to current admission diagnoses Updated medications and problem lists for reconciliation ED course, including vitals, labs, imaging, treatment and response to treatment Triage notes, nursing and pharmacy notes and ED provider's notes Notable results as noted in HPI   Assessment and  Plan: * Acute massive pulmonary embolism (HCC) RV strain, possible acute heart failure Acute respiratory failure with hypoxia Patient tachycardic, tachypneic with shortness of breath, PE on CTA BNP elevated at 158 but patient does not look clinically overloaded Heparin infusion Echocardiogram in the a.m. to assess degree of heart strain Daily weights with intake and output monitoring Vascular consult.  Spoke with Dr. Evie Lacks via secure chat who will see patient and decide on whether to consult IR  or with vascular which will be available on Monday  HCAP (healthcare-associated pneumonia) Possible severe sepsis versus SIRS Patient with low-grade temp, tachycardia, fever and chills and congestion s/p hospitalization a month prior Rocephin, azithromycin and vancomycin Follow blood cultures Sepsis fluids to progress cautiously with monitoring for fluid overload given RV strain Daily weights with intake and output monitoring Will get procalcitonin.  If not consistent with sepsis, will decrease fluid  Tachyarrhythmia Patient with narrow complex tachycardia treated as A-fib in the ED with Cardizem infusion with precipitous BP drop Personal read is not A-fib.  Suspecting tachyarrhythmia related to acute medical condition In the setting of right heart strain, and BP drop will wean off Cardizem Treat sepsis Monitor for acute heart failure in view of submassive PE  Hypotension Patient intermittently hypotensive since being placed on Cardizem Question whether related to decompensation from submassive PE versus Cardizem Will titrate off Cardizem drip as tachyarrhythmia was believed related to acute medical condition Continue to monitor blood pressure Hold off all home antihypertensives Will place patient in stepdown  Acute renal failure superimposed on stage 3a chronic kidney disease (HCC) Creatinine 1.6 up from baseline of 1.13 Expecting improvement with hydration  Obesity, Class III, BMI  40-49.9 (morbid obesity) (HCC) Complicating factor to overall prognosis and care  Carcinoma of overlapping sites of right breast in female, estrogen receptor positive (HCC) s/p of radiation and chemotherapy 2016 No acute issues suspected    CRITICAL CARE Performed by: Andris Baumann   Total critical care time: 80 minutes  Critical care time was exclusive of separately billable procedures and treating other patients.  Critical care was necessary to treat or prevent imminent or life-threatening deterioration.  Critical care was time spent personally by me on the following activities: development of treatment plan with patient and/or surrogate as well as nursing, discussions with consultants, evaluation of patient's response to treatment, examination of patient, obtaining history from patient or surrogate, ordering and performing treatments and interventions, ordering and review of laboratory studies, ordering and review of radiographic studies, pulse oximetry and re-evaluation of patient's condition.     DVT prophylaxis: Heparin and fusion  Consults: Vascular  Advance Care Planning:   Code Status: Prior   Family Communication: Husband at bedside.  Explained plan of care in detail.  They are in agreement with plan and voiced understanding  Disposition Plan: Back to previous home environment  Severity of Illness: The appropriate patient status for this patient is INPATIENT. Inpatient status is judged to be reasonable and necessary in order to provide the required intensity of service to ensure the patient's safety. The patient's presenting symptoms, physical exam findings, and initial radiographic and laboratory data in the context of their chronic comorbidities is felt to place them at high risk for further clinical deterioration. Furthermore, it is not anticipated that the patient will be medically stable for discharge from the hospital within 2 midnights of admission.   * I  certify that at the point of admission it is my clinical judgment that the patient will require inpatient hospital care spanning beyond 2 midnights from the point of admission due to high intensity of service, high risk for further deterioration and high frequency of surveillance required.*  Author: Andris Baumann, MD 07/31/2022 12:43 AM  For on call review [email or web address].

## 2022-07-31 NOTE — Assessment & Plan Note (Signed)
Complicating factor to overall prognosis and care 

## 2022-07-31 NOTE — Consult Note (Signed)
[facility] Vascular Consult Note  MRN : [id number]  Maria Gutierrez is a 82 y.o. ([birth year]) female who presents with chief complaint of  Chief Complaint  Patient presents with   Shortness of Breath  .  History of Present Illness: 60 yof history of breast cancer, HTN, CKD III, recent hospital admission for septic colitis in April 2024. States she has been short of breath for well over a month with limited ability to ambulate secondary to this. States she gets fatigued with minimal activity. States her shortness of breath worsened after a fall 2-3 days ago when she tripped and hit her head and right side. Denies LOC. Denies chest pain- notes some discomfort on right side where she fell. Denies thrombotic phenomenon in the past. Denies leg pain or swelling. States she has a headache that began this morning.  Current Facility-Administered Medications  Medication Dose Route Frequency Provider Last Rate Last Admin   acetaminophen (TYLENOL) tablet 650 mg  650 mg Oral Q6H PRN Andris Baumann, MD       Or   acetaminophen (TYLENOL) suppository 650 mg  650 mg Rectal Q6H PRN Andris Baumann, MD       ceFEPIme (MAXIPIME) 2 g in sodium chloride 0.9 % 100 mL IVPB  2 g Intravenous Q12H Belue, Lendon Collar, RPH       diltiazem (CARDIZEM) 125 mg in dextrose 5% 125 mL (1 mg/mL) infusion  5-15 mg/hr Intravenous Continuous Merwyn Katos, MD   Stopped at 07/31/22 0317   heparin ADULT infusion 100 units/mL (25000 units/233mL)  1,300 Units/hr Intravenous Continuous Otelia Sergeant, RPH 13 mL/hr at 07/31/22 0333 1,300 Units/hr at 07/31/22 3244   lactated ringers infusion  150 mL/hr Intravenous Continuous Andris Baumann, MD   Stopped at 07/31/22 0546   ondansetron (ZOFRAN) tablet 4 mg  4 mg Oral Q6H PRN Andris Baumann, MD       Or   ondansetron [facility]) injection 4 mg  4 mg Intravenous Q6H PRN Andris Baumann, MD       tamoxifen (NOLVADEX) tablet 20 mg  20 mg Oral Daily Andris Baumann, MD        vancomycin (VANCOREADY) IVPB 1250 mg/250 mL  1,250 mg Intravenous Once Otelia Sergeant, RPH 166.7 mL/hr at 07/31/22 0705 1,250 mg at 07/31/22 0705   vancomycin variable dose per unstable renal function (pharmacist dosing)   Does not apply See admin instructions Otelia Sergeant, RPH       venlafaxine XR (EFFEXOR-XR) 24 hr capsule 75 mg  75 mg Oral Q breakfast Andris Baumann, MD       Current Outpatient Medications  Medication Sig Dispense Refill   acetaminophen (TYLENOL) 325 MG tablet Take 2 tablets (650 mg total) by mouth every 6 (six) hours as needed for mild pain or fever. 20 tablet 0   aspirin EC 81 MG tablet Take 81 mg by mouth daily.     calcium carbonate (OS-CAL) 600 MG TABS tablet Take 600 mg by mouth daily.     cyanocobalamin 1000 MCG tablet Take 1,000 mcg by mouth daily.     cyclobenzaprine (FLEXERIL) 10 MG tablet 1/2-1 po qHS prn     HYDROcodone-acetaminophen (NORCO) 7.5-325 MG tablet Take 1 tablet by mouth 2 (two) times daily as needed for pain.  0   ketorolac (TORADOL) 10 MG tablet Take 10 mg by mouth every 6 (six) hours as needed.     Magnesium Oxide, Antacid,  500 MG CAPS Take 500 mg by mouth daily.     metoprolol succinate (TOPROL-XL) 50 MG 24 hr tablet Take 50 mg by mouth every morning. Take with or immediately following a meal.     Multiple Vitamin (MULTIVITAMIN WITH MINERALS) TABS tablet Take 1 tablet by mouth daily.     pantoprazole (PROTONIX) 40 MG tablet Take 40 mg by mouth daily.     simvastatin (ZOCOR) 40 MG tablet Take 40 mg by mouth at bedtime.      tamoxifen (NOLVADEX) 20 MG tablet TAKE 1 TABLET BY MOUTH DAILY 70 tablet 4   triamterene-hydrochlorothiazide (MAXZIDE-25) 37.5-25 MG tablet Take 1 tablet by mouth daily.     venlafaxine XR (EFFEXOR-XR) 75 MG 24 hr capsule TAKE 1 CAPSULE BY MOUTH DAILY  WITH BREAKFAST 90 capsule 3   Vitamin D, Ergocalciferol, (DRISDOL) 1.25 MG (50000 UNIT) CAPS capsule TAKE 1 CAPSULE BY MOUTH EVERY 7  DAYS TAKE ON MONDAYS 15 capsule 2    fluticasone (FLONASE) 50 MCG/ACT nasal spray Place 1 spray into both nostrils daily. (Patient not taking: Reported on 07/30/2022) 16 g 2   Facility-Administered Medications Ordered in Other Encounters  Medication Dose Route Frequency Provider Last Rate Last Admin   sodium chloride 0.9 % injection 10 mL  10 mL Intravenous PRN Choksi, Valarie Cones, MD   10 mL at 01/27/15 1416   sodium chloride flush (NS) 0.9 % injection 10 mL  10 mL Intravenous PRN Earna Coder, MD   10 mL at 12/10/15 1347    Past Medical History:  Diagnosis Date   Anemia    B12, d2 and magnesium deficiencies   Anxiety    Arthritis    OSTEOARTHRITIS   Breast cancer of upper-outer quadrant of right female breast (HCC) 06/10/2014   10 mm invasive mammary carcinoma, T1b,Nx; ER 90%, 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 extensive intermediate grade DCIS.    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   DDD (degenerative disc disease), lumbar    Dyspnea    GERD (gastroesophageal reflux disease)    Heart murmur    History of blood transfusion    Hyperlipidemia    Hypertension    Personal history of chemotherapy 2016   right breast ca   Personal history of radiation therapy 2016   mammosite   SOB (shortness of breath) 12/03/2014    Past Surgical History:  Procedure Laterality Date   ABDOMINAL HYSTERECTOMY     AXILLARY LYMPH NODE BIOPSY Right 07/14/2014   Procedure: AXILLARY LYMPH NODE BIOPSY;  Surgeon: Earline Mayotte, MD;  Location: [facility] ORS;  Service: General;  Laterality: Right;   BACK SURGERY  12/29/2020   BOTOX INJECTION N/A 01/13/2020   Procedure: BOTOX INJECTION;  Surgeon: Vanna Scotland, MD;  Location: [facility] ORS;  Service: Urology;  Laterality: N/A;   BREAST BIOPSY Left 2012   benign   BREAST BIOPSY Right 05/26/2014   DCIS    BREAST EXCISIONAL BIOPSY Right 06/28/2017    ULCERATED SKIN WITH UNDERLYING FAT NECROSIS, ACUTE INFLAMMATION AND REACTIVE EPITHELIAL ATYPIA   BREAST LUMPECTOMY Right  06/10/2014   DCIS and Invasive ductal carcinoma, clear margins   BREAST SURGERY Right 06/10/2014   Wide excision for intermediate grade DCIS, identification of a 10  millimeter area of invasive mammary carcinoma.   CHOLECYSTECTOMY  2012   COLONOSCOPY  2009   EYE SURGERY Bilateral 2014   cataract   IR KYPHO EA ADDL LEVEL THORACIC OR LUMBAR  02/12/2021  JOINT REPLACEMENT Left 2001   knee   JOINT REPLACEMENT Right 2003   knee   JOINT REPLACEMENT Left 2004   replacement joint broken   kneee surgery Left    PORT-A-CATH REMOVAL  02/11/2016   Dr Lemar Livings   PORTACATH PLACEMENT Left 08/27/2014   Procedure: INSERTION PORT-A-CATH;  Surgeon: Earline Mayotte, MD;  Location: [facility] ORS;  Service: General;  Laterality: Left;    Social History Social History   Tobacco Use   Smoking status: Never   Smokeless tobacco: Never  Substance Use Topics   Alcohol use: No    Alcohol/week: 0.0 standard drinks of alcohol   Drug use: No    Family History Family History  Problem Relation Age of Onset   Heart disease Sister    Breast cancer Neg Hx     No Known Allergies   REVIEW OF SYSTEMS (Negative unless checked)  Constitutional: [] Weight loss  [] Fever  [] Chills Cardiac: [] Chest pain   [] Chest pressure   [] Palpitations   [x] Shortness of breath when laying flat   [x] Shortness of breath at rest   [x] Shortness of breath with exertion. Vascular:  [] Pain in legs with walking   [] Pain in legs at rest   [] Pain in legs when laying flat   [] Claudication   [] Pain in feet when walking  [] Pain in feet at rest  [] Pain in feet when laying flat   [] History of DVT   [] Phlebitis   [] Swelling in legs   [] Varicose veins   [] Non-healing ulcers Pulmonary:   [] Uses home oxygen   [] Productive cough   [] Hemoptysis   [] Wheeze  [] COPD   [] Asthma Neurologic:  [] Dizziness  [] Blackouts   [] Seizures   [] History of stroke   [] History of TIA  [] Aphasia   [] Temporary blindness   [] Dysphagia   [] Weakness or numbness in arms    [] Weakness or numbness in legs Musculoskeletal:  [] Arthritis   [] Joint swelling   [] Joint pain   [] Low back pain Hematologic:  [] Easy bruising  [] Easy bleeding   [] Hypercoagulable state   [] Anemic  [] Hepatitis Gastrointestinal:  [] Blood in stool   [] Vomiting blood  [] Gastroesophageal reflux/heartburn   [] Difficulty swallowing. Genitourinary:  [x] Chronic kidney disease   [] Difficult urination  [] Frequent urination  [] Burning with urination   [] Blood in urine Skin:  [] Rashes   [] Ulcers   [] Wounds Psychological:  [] History of anxiety   []  History of major depression.  Physical Examination  Vitals:   07/31/22 0500 07/31/22 0530 07/31/22 0600 07/31/22 0641  BP: 97/64 107/75 (!) 114/103   Pulse: (!) 108 (!) 138 (!) 43 (!) 102  Resp: (!) 28 (!) 31 (!) 28   Temp:      TempSrc:      SpO2: 94% (!) 72% 92%   Weight:      Height:       Body mass index is 45.24 kg/m. Gen:  WD/WN, NAD Neck: Trachea midline.  No JVD.  Pulmonary:  Good air movement, respirations not labored, equal bilaterally.  Cardiac: RRR, normal S1, S2. Vascular:  Vessel Right Left  Radial Palpable Palpable  Ulnar    Brachial    Carotid Palpable, without bruit Palpable, without bruit  Aorta Not palpable N/A  Femoral Palpable Palpable  Popliteal    PT    DP Palpable Palpable   Gastrointestinal: soft, non-tender/non-distended. No guarding/reflex.  Musculoskeletal: M/S 5/5 throughout.  Extremities without ischemic changes.  No deformity or atrophy. No edema. Neurologic: Sensation grossly intact in extremities.  Symmetrical.  Speech  is fluent. Motor exam as listed above. Psychiatric: Judgment intact, Mood & affect appropriate for pt's clinical situation. Dermatologic: No rashes or ulcers noted.  No cellulitis or open wounds. Lymph : No Cervical, Axillary, or Inguinal lymphadenopathy.     CBC Lab Results  Component Value Date   WBC 19.0 (H) 07/30/2022   HGB 13.3 07/30/2022   HCT 39.7 07/30/2022   MCV 95.2  07/30/2022   PLT 148 (L) 07/30/2022    BMET    Component Value Date/Time   NA 133 (L) 07/30/2022 2053   NA 141 06/05/2014 0843   K 3.0 (L) 07/30/2022 2053   K 3.5 06/05/2014 0843   CL 100 07/30/2022 2053   CL 105 06/05/2014 0843   CO2 22 07/30/2022 2053   CO2 27 06/05/2014 0843   GLUCOSE 157 (H) 07/30/2022 2053   GLUCOSE 94 06/05/2014 0843   BUN 20 07/30/2022 2053   BUN 22 (H) 06/05/2014 0843   CREATININE 1.60 (H) 07/30/2022 2053   CREATININE 1.15 (H) 02/05/2021 1018   CALCIUM 8.7 (L) 07/30/2022 2053   CALCIUM 9.2 06/05/2014 0843   GFRNONAA 32 (L) 07/30/2022 2053   GFRNONAA 58 (L) 06/05/2014 0843   GFRAA 44 (L) 08/05/2019 1315   GFRAA >60 06/05/2014 0843   Estimated Creatinine Clearance: 33.3 mL/min (A) (by C-G formula based on SCr of 1.6 mg/dL (H)).  COAG Lab Results  Component Value Date   INR 1.1 07/30/2022   INR 1.1 06/07/2022   INR 0.9 11/21/2008    Radiology CT Angio Chest PE W/Cm &/Or Wo Cm  Result Date: 07/30/2022 CLINICAL DATA:  High probability for PE.  Shortness of breath. EXAM: CT ANGIOGRAPHY CHEST WITH CONTRAST TECHNIQUE: Multidetector CT imaging of the chest was performed using the standard protocol during bolus administration of intravenous contrast. Multiplanar CT image reconstructions and MIPs were obtained to evaluate the vascular anatomy. RADIATION DOSE REDUCTION: This exam was performed according to the departmental dose-optimization program which includes automated exposure control, adjustment of the mA and/or kV according to patient size and/or use of iterative reconstruction technique. CONTRAST:  75mL OMNIPAQUE IOHEXOL 350 MG/ML SOLN COMPARISON:  CT abdomen and pelvis 06/23/2022. FINDINGS: Cardiovascular: There is adequate opacification of the pulmonary arteries. There are segmental right lower lobe and right middle lobe pulmonary emboli. There also segmental left lower lobe pulmonary emboli. Heart is normal in size. There is no pericardial effusion.  Aorta is normal in size. There are atherosclerotic calcifications of the aorta. Mediastinum/Nodes: No enlarged mediastinal, hilar, or axillary lymph nodes. Thyroid gland, trachea, and esophagus demonstrate no significant findings. Lungs/Pleura: There are minimal scattered ground-glass and peripheral interstitial opacities throughout both lungs. There is no pleural effusion or pneumothorax. Trachea and central airways are patent. Upper Abdomen: No acute abnormality. Left renal cysts partially imaged. Cholecystectomy clips are present. Musculoskeletal: Vertebroplasty changes are seen at T8. Review of the MIP images confirms the above findings. IMPRESSION: 1. Bilateral segmental pulmonary emboli involving the right middle lobe, right lower lobe and left lower lobe. Positive for acute PE with CTevidence of right heart strain (RV/LV Ratio = 1.1) consistent with at least submassive (intermediate risk) PE. The presence of right heart strain has been associated with an increased risk of morbidity and mortality. 2. Minimal scattered ground-glass and peripheral interstitial opacities throughout both lungs, likely infectious/inflammatory. Aortic Atherosclerosis (ICD10-I70.0). These results were called by telephone at the time of interpretation on 07/30/2022 at 11:16 pm to provider [facility] , who verbally acknowledged these results. Electronically Signed  By: Darliss Cheney M.D.   On: 07/30/2022 23:16   DG Chest Port 1 View  Result Date: 07/30/2022 CLINICAL DATA:  Shortness of breath.  Fever EXAM: PORTABLE CHEST 1 VIEW COMPARISON:  June 07, 2022 FINDINGS: No pneumothorax. No pneumothorax. The cardiomediastinal silhouette is stable. Bilateral increased lung markings are largely stable. Mildly more focal opacity in the left mid lung. The retrocardiac region on the left is not well assessed due to lack of lateral imaging. No other abnormalities. IMPRESSION: 1. Focal opacity in left mid lung could represent developing  infiltrate/pneumonia given history. Recommend short-term follow-up imaging to ensure resolution. 2. Increase interstitial markings bilaterally are stable. This could represent a chronic process given stability, recurrent pulmonary edema, or an atypical infectious process. Electronically Signed   By: Gerome Sam III M.D.   On: 07/30/2022 21:36      Assessment/Plan 1. Bilateral subsegmental Pulmonary emboli with right heart strain- hemodynamically stable on room air; evidence of Pneumonia 2. Heparin gtt; ABX 3. Will plan on Pulmonary thrombectomy at some point early this week. Continue supportive care. Will follow closely. 4. Obtain CT Head   Bertram Denver, MD  07/31/2022 7:54 AM    This note was created with Dragon medical transcription system.  Any error is purely unintentional

## 2022-07-31 NOTE — Progress Notes (Signed)
Pharmacy Antibiotic Note  Maria Gutierrez is a 82 y.o. female admitted on 07/30/2022 with pneumonia.  Pharmacy has been consulted for Cefepime & Vancomycin dosing for 7 days.  Plan: Scr improved 1.60 >>1.44  Cefepime 2 gm q12hr per indication & Crcl 37 ml/min  Pt given Vancomycin 2500 mg Loading dose 6/2@0546 . Will continue with Vancomycin 1500 mg IV Q 48 hrs to start 6/4 @0600 . Goal AUC 400-550. Expected AUC: 526 SCr used: 1.44 Cmin 11.8  BMI: 45   Vd=0.5  F/u renal fxn and cultures, MRSA PCR ordered  Temp (24hrs), Avg:99.6 F (37.6 C), Min:99.1 F (37.3 C), Max:100 F (37.8 C)   Recent Labs  Lab 07/30/22 2053 07/30/22 2220 07/31/22 1108  WBC 19.0*  --  18.1*  CREATININE 1.60*  --  1.44*  LATICACIDVEN  --  1.6  --      Estimated Creatinine Clearance: 37 mL/min (A) (by C-G formula based on SCr of 1.44 mg/dL (H)).    No Known Allergies  Antimicrobials this admission: 6/02 Azithromycin  x 1 dose 6/02 Ceftriaxone  x 1 dose 6/02 Cefepime >>    x 7 days 6/02 Vancomycin >>   x 7 days  Microbiology results: 6/01 BCx: NG,12hr MRSA PCR ordered  Thank you for allowing pharmacy to be a part of this patient's care.  Bari Mantis PharmD Clinical Pharmacist 07/31/2022

## 2022-07-31 NOTE — Progress Notes (Signed)
ANTICOAGULATION CONSULT NOTE  Pharmacy Consult for heparin infusion Indication: pulmonary embolus  No Known Allergies  Patient Measurements: Height: 5' 2.5" (158.8 cm) Weight: 126 kg (277 lb 12.5 oz) IBW/kg (Calculated) : 51.25 Heparin Dosing Weight: 79 kg  Vital Signs: Temp: 99.5 F (37.5 C) (06/02 1929) Temp Source: Oral (06/02 1929) BP: 139/85 (06/02 1930) Pulse Rate: 83 (06/02 1930)  Labs: Recent Labs    07/30/22 2053 07/31/22 0105 07/31/22 0319 07/31/22 0815 07/31/22 1108 07/31/22 1830  HGB 13.3  --   --   --  11.8*  --   HCT 39.7  --   --   --  35.8*  --   PLT 148*  --   --   --  136*  --   APTT 31  --   --   --   --   --   LABPROT 14.7  --   --   --   --   --   INR 1.1  --   --   --   --   --   HEPARINUNFRC  --   --   --  0.17*  --  <0.10*  CREATININE 1.60*  --   --   --  1.44*  --   TROPONINIHS  --  24* 25*  --   --   --      Estimated Creatinine Clearance: 39.3 mL/min (A) (by C-G formula based on SCr of 1.44 mg/dL (H)).   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 extensive intermediate grade DCIS.    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   DDD (degenerative disc disease), lumbar    Dyspnea    GERD (gastroesophageal reflux disease)    Heart murmur    History of blood transfusion    Hyperlipidemia    Hypertension    Personal history of chemotherapy 2016   right breast ca   Personal history of radiation therapy 2016   mammosite   SOB (shortness of breath) 12/03/2014    Assessment: Pt is a 82 yo female presenting to ED for SOB, found with " bilateral segmental pulmonary emboli involving the right middle lobe, right lower lobe and left lower lobe."  6/2 0848   HL=0.17 Subtherapeutic, inc to 1550 u/hr 6/2 1830  HL<0.10      Subtherapeutic, inc to 1800 units/hr  Goal of Therapy:  Heparin level 0.3-0.7 units/ml Monitor platelets by anticoagulation protocol: Yes   Plan:  Bolus 2400 units x 1 Increase heparin infusion from 1550 units/hr to 1800 units/hr Will check HL in 8 hr after rate change CBC daily while on heparin  Clovia Cuff, PharmD, BCPS 07/31/2022 8:15 PM

## 2022-07-31 NOTE — Consult Note (Signed)
Cardiology Consultation   Patient ID: Maria Gutierrez MRN: [id number]; DOB: [birth year]  Admit date: 07/30/2022 Date of Consult: 07/31/2022  PCP:  Jerl Mina, MD   [facility] Providers Cardiologist:  New  Patient Profile:   Maria Gutierrez is a 82 y.o. female with a hx of HTN, obesity, depression, anxiety, CKD stage 3, breast cancer s/p radiation and chemo 2016, gout, who is being seen 07/31/2022 for the evaluation of tachyarrhythmia at the request of Dr. Walker Kehr.  History of Present Illness:   [name] has not been seen by a cardiologist in the past. She reports cardiac issues in her sister and brother, but she is unsure with what. She denies alcohol, tobacco or drug history.   She was recently hospitalization 4/9-2/12 with sepsis 2/2 acute colitis.  The patient presented to the ER with shortness of breath, cough, congestion, fever and chills. She had a mechanical fall on Thursday (4 days ago) and since then, she hasn't felt the same. She noted progressive SOB. She also noted chest tightness, worse with exertion. No LLE.   In the ER tmax 100, pulse 150bpm, RR 30s, O2 in the low 90s placed on 2L O2. Labs showed WBC 19k, plt 148. LA 1.6, K3, Na 133, Scr 1.6, BNP 159. HS trop 24>25.EKG shows ST 123bpm. Chest CTA showed bilateral segmental PE with evidence of right heart strain consistent with submassive PE and PNA. She was started on IV heparin and abx and admitted.    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 extensive intermediate grade DCIS.    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   DDD (degenerative disc disease), lumbar    Dyspnea    GERD (gastroesophageal reflux disease)    Heart murmur     History of blood transfusion    Hyperlipidemia    Hypertension    Personal history of chemotherapy 2016   right breast ca   Personal history of radiation therapy 2016   mammosite   SOB (shortness of breath) 12/03/2014    Past Surgical History:  Procedure Laterality Date   ABDOMINAL HYSTERECTOMY     AXILLARY LYMPH NODE BIOPSY Right 07/14/2014   Procedure: AXILLARY LYMPH NODE BIOPSY;  Surgeon: Earline Mayotte, MD;  Location: [facility] ORS;  Service: General;  Laterality: Right;   BACK SURGERY  12/29/2020   BOTOX INJECTION N/A 01/13/2020   Procedure: BOTOX INJECTION;  Surgeon: Vanna Scotland, MD;  Location: [facility] ORS;  Service: Urology;  Laterality: N/A;   BREAST BIOPSY Left 2012   benign   BREAST BIOPSY Right 05/26/2014   DCIS    BREAST EXCISIONAL BIOPSY Right 06/28/2017    ULCERATED SKIN WITH UNDERLYING FAT NECROSIS, ACUTE INFLAMMATION AND REACTIVE EPITHELIAL ATYPIA   BREAST LUMPECTOMY Right 06/10/2014   DCIS and Invasive ductal carcinoma, clear margins   BREAST SURGERY Right 06/10/2014   Wide excision for intermediate grade DCIS, identification of a 10  millimeter area of invasive mammary carcinoma.   CHOLECYSTECTOMY  2012   COLONOSCOPY  2009   EYE SURGERY Bilateral 2014   cataract   IR KYPHO EA ADDL LEVEL THORACIC OR LUMBAR  02/12/2021   JOINT REPLACEMENT Left 2001  knee   JOINT REPLACEMENT Right 2003   knee   JOINT REPLACEMENT Left 2004   replacement joint broken   kneee surgery Left    PORT-A-CATH REMOVAL  02/11/2016   Dr Lemar Livings   PORTACATH PLACEMENT Left 08/27/2014   Procedure: INSERTION PORT-A-CATH;  Surgeon: Earline Mayotte, MD;  Location: [facility] ORS;  Service: General;  Laterality: Left;     Home Medications:  Prior to Admission medications   Medication Sig Start Date End Date Taking? Authorizing Provider  acetaminophen (TYLENOL) 325 MG tablet Take 2 tablets (650 mg total) by mouth every 6 (six) hours as needed for mild pain or fever. 06/10/22  Yes Loyce Dys,  MD  aspirin EC 81 MG tablet Take 81 mg by mouth daily.   Yes [provider]  calcium carbonate (OS-CAL) 600 MG TABS tablet Take 600 mg by mouth daily.   Yes [provider]  cyanocobalamin 1000 MCG tablet Take 1,000 mcg by mouth daily.   Yes [provider]  cyclobenzaprine (FLEXERIL) 10 MG tablet 1/2-1 po qHS prn 01/03/17  Yes [provider]  HYDROcodone-acetaminophen (NORCO) 7.5-325 MG tablet Take 1 tablet by mouth 2 (two) times daily as needed for pain. 01/10/18  Yes [provider]  ketorolac (TORADOL) 10 MG tablet Take 10 mg by mouth every 6 (six) hours as needed. 07/20/22  Yes [provider]  Magnesium Oxide, Antacid, 500 MG CAPS Take 500 mg by mouth daily.   Yes [provider]  metoprolol succinate (TOPROL-XL) 50 MG 24 hr tablet Take 50 mg by mouth every morning. Take with or immediately following a meal.   Yes [provider]  Multiple Vitamin (MULTIVITAMIN WITH MINERALS) TABS tablet Take 1 tablet by mouth daily.   Yes [provider]  pantoprazole (PROTONIX) 40 MG tablet Take 40 mg by mouth daily. 06/15/22 06/15/23 Yes [provider]  simvastatin (ZOCOR) 40 MG tablet Take 40 mg by mouth at bedtime.  12/25/17  Yes [provider]  tamoxifen (NOLVADEX) 20 MG tablet TAKE 1 TABLET BY MOUTH DAILY 05/16/22  Yes Earna Coder, MD  triamterene-hydrochlorothiazide (MAXZIDE-25) 37.5-25 MG tablet Take 1 tablet by mouth daily.   Yes [provider]  venlafaxine XR (EFFEXOR-XR) 75 MG 24 hr capsule TAKE 1 CAPSULE BY MOUTH DAILY  WITH BREAKFAST 03/29/22  Yes Earna Coder, MD  Vitamin D, Ergocalciferol, (DRISDOL) 1.25 MG (50000 UNIT) CAPS capsule TAKE 1 CAPSULE BY MOUTH EVERY 7  DAYS TAKE ON MONDAYS 04/18/22  Yes Earna Coder, MD  fluticasone (FLONASE) 50 MCG/ACT nasal spray Place 1 spray into both nostrils daily. Patient not taking: Reported on 07/30/2022 10/14/14   Johney Maine, MD    Inpatient Medications: Scheduled Meds:  tamoxifen  20 mg Oral Daily   vancomycin variable dose per unstable renal function (pharmacist dosing)   Does not apply See admin instructions   venlafaxine XR  75 mg Oral Q breakfast   Continuous Infusions:  ceFEPime (MAXIPIME) IV     diltiazem (CARDIZEM) infusion Stopped (07/31/22 0317)   heparin 1,300 Units/hr (07/31/22 0333)   lactated ringers Stopped (07/31/22 0546)   vancomycin 1,250 mg (07/31/22 0705)   PRN Meds: acetaminophen **OR** acetaminophen, ondansetron **OR** ondansetron (ZOFRAN) IV  Allergies:   No Known Allergies  Social History:   Social History   Socioeconomic History   Marital status: Married    Spouse name: Alinda Money   Number of children: 1   Years of education: Not on file  Highest education level: Not on file  Occupational History   Occupation: house cleaner    Comment: retired  Tobacco Use   Smoking status: Never   Smokeless tobacco: Never  Substance and Sexual Activity   Alcohol use: No    Alcohol/week: 0.0 standard drinks of alcohol   Drug use: No   Sexual activity: Not Currently  Other Topics Concern   Not on file  Social History Narrative   Patient lives with husband and feels safe in her home.   Remains active, drives and is self sufficient.   Social Determinants of Health   Financial Resource Strain: Not on file  Food Insecurity: No Food Insecurity (06/07/2022)   Hunger Vital Sign    Worried About Running Out of Food in the Last Year: Never true    Ran Out of Food in the Last Year: Never true  Transportation Needs: No Transportation Needs (06/07/2022)   PRAPARE - Administrator, Civil Service (Medical): No    Lack of Transportation (Non-Medical): No  Physical Activity: Not on file  Stress: Not on file  Social Connections: Not on file  Intimate Partner Violence: Not At Risk (06/07/2022)   Humiliation, Afraid, Rape, and Kick questionnaire    Fear of Current or Ex-Partner:  No    Emotionally Abused: No    Physically Abused: No    Sexually Abused: No    Family History:    Family History  Problem Relation Age of Onset   Heart disease Sister    Breast cancer Neg Hx      ROS:  Please see the history of present illness.   All other ROS reviewed and negative.     Physical Exam/Data:   Vitals:   07/31/22 0500 07/31/22 0530 07/31/22 0600 07/31/22 0641  BP: 97/64 107/75 (!) 114/103   Pulse: (!) 108 (!) 138 (!) 43 (!) 102  Resp: (!) 28 (!) 31 (!) 28   Temp:      TempSrc:      SpO2: 94% (!) 72% 92%   Weight:      Height:       No intake or output data in the 24 hours ending 07/31/22 0741    07/30/2022    8:54 PM 06/07/2022    8:52 AM 10/08/2021    1:13 PM  Last 3 Weights  Weight (lbs) 251 lb 5.2 oz 250 lb 276 lb 6.4 oz  Weight (kg) 114 kg 113.399 kg 125.374 kg     Body mass index is 45.24 kg/m.  General:  Well nourished, well developed, in no acute distress HEENT: normal Neck: no JVD Vascular: No carotid bruits; Distal pulses 2+ bilaterally Cardiac:  normal S1, S2; tachycardia, RR; no murmur  Lungs:   diffusely diminished, +wheezing Abd: soft, nontender, no hepatomegaly  Ext: no edema Musculoskeletal:  No deformities, BUE and BLE strength normal and equal Skin: warm and dry  Neuro:  CNs 2-12 intact, no focal abnormalities noted Psych:  Normal affect   EKG:  The EKG was personally reviewed and demonstrates:  ST 123bpm, nonspecific T wave changes Telemetry:  Telemetry was personally reviewed and demonstrates:  ST, HR 110-130s, PVC/PACs  Relevant CV Studies:  Echo ordered  Laboratory Data:  High Sensitivity Troponin:   Recent Labs  Lab 07/31/22 0105 07/31/22 0319  TROPONINIHS 24* 25*     Chemistry Recent Labs  Lab 07/30/22 2053  NA 133*  K 3.0*  CL 100  CO2 22  GLUCOSE 157*  BUN 20  CREATININE 1.60*  CALCIUM 8.7*  GFRNONAA 32*  ANIONGAP 11    Recent Labs  Lab 07/30/22 2053  PROT 6.9  ALBUMIN 3.4*  AST 34  ALT 15   ALKPHOS 54  BILITOT 1.5*   Lipids No results for input(s): "CHOL", "TRIG", "HDL", "LABVLDL", "LDLCALC", "CHOLHDL" in the last 168 hours.  Hematology Recent Labs  Lab 07/30/22 2053  WBC 19.0*  RBC 4.17  HGB 13.3  HCT 39.7  MCV 95.2  MCH 31.9  MCHC 33.5  RDW 12.4  PLT 148*   Thyroid No results for input(s): "TSH", "FREET4" in the last 168 hours.  BNP Recent Labs  Lab 07/30/22 2053  BNP 158.9*    DDimer No results for input(s): "DDIMER" in the last 168 hours.   Radiology/Studies:  CT Angio Chest PE W/Cm &/Or Wo Cm  Result Date: 07/30/2022 CLINICAL DATA:  High probability for PE.  Shortness of breath. EXAM: CT ANGIOGRAPHY CHEST WITH CONTRAST TECHNIQUE: Multidetector CT imaging of the chest was performed using the standard protocol during bolus administration of intravenous contrast. Multiplanar CT image reconstructions and MIPs were obtained to evaluate the vascular anatomy. RADIATION DOSE REDUCTION: This exam was performed according to the departmental dose-optimization program which includes automated exposure control, adjustment of the mA and/or kV according to patient size and/or use of iterative reconstruction technique. CONTRAST:  75mL OMNIPAQUE IOHEXOL 350 MG/ML SOLN COMPARISON:  CT abdomen and pelvis 06/23/2022. FINDINGS: Cardiovascular: There is adequate opacification of the pulmonary arteries. There are segmental right lower lobe and right middle lobe pulmonary emboli. There also segmental left lower lobe pulmonary emboli. Heart is normal in size. There is no pericardial effusion. Aorta is normal in size. There are atherosclerotic calcifications of the aorta. Mediastinum/Nodes: No enlarged mediastinal, hilar, or axillary lymph nodes. Thyroid gland, trachea, and esophagus demonstrate no significant findings. Lungs/Pleura: There are minimal scattered ground-glass and peripheral interstitial opacities throughout both lungs. There is no pleural effusion or pneumothorax. Trachea and  central airways are patent. Upper Abdomen: No acute abnormality. Left renal cysts partially imaged. Cholecystectomy clips are present. Musculoskeletal: Vertebroplasty changes are seen at T8. Review of the MIP images confirms the above findings. IMPRESSION: 1. Bilateral segmental pulmonary emboli involving the right middle lobe, right lower lobe and left lower lobe. Positive for acute PE with CTevidence of right heart strain (RV/LV Ratio = 1.1) consistent with at least submassive (intermediate risk) PE. The presence of right heart strain has been associated with an increased risk of morbidity and mortality. 2. Minimal scattered ground-glass and peripheral interstitial opacities throughout both lungs, likely infectious/inflammatory. Aortic Atherosclerosis (ICD10-I70.0). These results were called by telephone at the time of interpretation on 07/30/2022 at 11:16 pm to provider [facility] , who verbally acknowledged these results. Electronically Signed   By: Darliss Cheney M.D.   On: 07/30/2022 23:16   DG Chest Port 1 View  Result Date: 07/30/2022 CLINICAL DATA:  Shortness of breath.  Fever EXAM: PORTABLE CHEST 1 VIEW COMPARISON:  June 07, 2022 FINDINGS: No pneumothorax. No pneumothorax. The cardiomediastinal silhouette is stable. Bilateral increased lung markings are largely stable. Mildly more focal opacity in the left mid lung. The retrocardiac region on the left is not well assessed due to lack of lateral imaging. No other abnormalities. IMPRESSION: 1. Focal opacity in left mid lung could represent developing infiltrate/pneumonia given history. Recommend short-term follow-up imaging to ensure resolution. 2. Increase interstitial markings bilaterally are stable. This could represent a chronic process given stability, recurrent pulmonary  edema, or an atypical infectious process. Electronically Signed   By: Gerome Sam III M.D.   On: 07/30/2022 21:36     Assessment and Plan:   Tachyarrhythmia - elevated  heart rate in the setting of PE and PNA started on IV dilt, but stopped for hypotension - EKG shows ST 123bpm - tele also appears to show ST with PACs HR 110-130 - the patient is still visibly short of breath - continue telemetry - check TSH - Keep Mag>2 and K>4 - suspect rates will improve as lung issues improve. Can possibly start short acting rate control med for now  Bilateral subsegmental PE with right heart strain - IV heparin - Vascular planning on thrombectomy later this week - echo ordered. She does not appear significantly volume up  PNA - abx per IM   Elevated troponin - HS trop mildly elevated 24>25 - she reports exertional chest tightness in the setting of PE - IV heparin - check lipid panel and A1C - check echo - may benefit from ischemic eval as outpatient  HLD - PTA simvastatin - LDL 75 in 2023  HTN - PTA Toprol 50mg  daily, Maxzide 37.5-25mg  daily>held for hypotension  For questions or updates, please contact [facility] Please consult [email or web address] for contact info under    Signed, Isreal Moline David Stall, PA-C  07/31/2022 7:41 AM

## 2022-07-31 NOTE — Assessment & Plan Note (Addendum)
RV strain, possible acute heart failure Acute respiratory failure with hypoxia Patient tachycardic, tachypneic with shortness of breath, PE on CTA BNP elevated at 158 but patient does not look clinically overloaded Heparin infusion Echocardiogram in the a.m. to assess degree of heart strain Daily weights with intake and output monitoring Vascular consult.  Spoke with Dr. Evie Lacks via secure chat who will see patient and decide on whether to consult IR or with vascular which will be available on Monday

## 2022-07-31 NOTE — Progress Notes (Signed)
ANTICOAGULATION CONSULT NOTE  Pharmacy Consult for heparin infusion Indication: pulmonary embolus  No Known Allergies  Patient Measurements: Height: 5' 2.5" (158.8 cm) Weight: 114 kg (251 lb 5.2 oz) IBW/kg (Calculated) : 51.25 Heparin Dosing Weight: 79 kg  Vital Signs: Temp: 99.1 F (37.3 C) (06/02 0417) Temp Source: Oral (06/02 0417) BP: 94/63 (06/02 0900) Pulse Rate: 86 (06/02 0900)  Labs: Recent Labs    07/30/22 2053 07/31/22 0105 07/31/22 0319 07/31/22 0815  HGB 13.3  --   --   --   HCT 39.7  --   --   --   PLT 148*  --   --   --   APTT 31  --   --   --   LABPROT 14.7  --   --   --   INR 1.1  --   --   --   HEPARINUNFRC  --   --   --  0.17*  CREATININE 1.60*  --   --   --   TROPONINIHS  --  24* 25*  --      Estimated Creatinine Clearance: 33.3 mL/min (A) (by C-G formula based on SCr of 1.6 mg/dL (H)).   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 extensive intermediate grade DCIS.    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   DDD (degenerative disc disease), lumbar    Dyspnea    GERD (gastroesophageal reflux disease)    Heart murmur    History of blood transfusion    Hyperlipidemia    Hypertension    Personal history of chemotherapy 2016   right breast ca   Personal history of radiation therapy 2016   mammosite   SOB (shortness of breath) 12/03/2014    Assessment: Pt is a 82 yo female presenting to ED for SOB, found with " bilateral segmental pulmonary emboli involving the right middle lobe, right lower lobe and left lower lobe."  6/2 0848  HL=0.17 Subtherapeutic, inc to 1550 u/hr  Goal of Therapy:  Heparin level 0.3-0.7 units/ml Monitor platelets by  anticoagulation protocol: Yes   Plan:  Bolus 2400 units x 1 Increase heparin infusion from 1300 to 1550 units/hr Will check HL in 8 hr after rate change CBC daily while on heparin  Bari Mantis PharmD Clinical Pharmacist 07/31/2022

## 2022-07-31 NOTE — Progress Notes (Signed)
PROGRESS NOTE    Maria Gutierrez  AOZ:[id number] DOB: 1940-07-26 DOA: 07/30/2022 PCP: Jerl Mina, MD   Assessment & Plan:   Principal Problem:   Acute massive pulmonary embolism (HCC) Active Problems:   HCAP (healthcare-associated pneumonia)   Severe sepsis (HCC)   Acute respiratory failure with hypoxia (HCC)   Tachyarrhythmia   Hypotension   Acute renal failure superimposed on stage 3a chronic kidney disease (HCC)   Obesity, Class III, BMI 40-49.9 (morbid obesity) (HCC)   Carcinoma of overlapping sites of right breast in female, estrogen receptor positive (HCC)   Pulmonary embolism, bilateral (HCC)  Assessment and Plan:  Acute massive pulmonary embolism: w/ right heart strain. Echo ordered. Continue on IV heparin. Will need thrombectomy. Vasc surg following and recs apprec.   Acute hypoxic respiratory failure: likely secondary to PE & pneumonia. Continue on IV heparin & IV abxs.    HCAP: continue on IV cefepime, azithromycin. D/c IV vanco. Continue on bronchodilators & encourage incentive spirometry. Procal < 0.10   Sepsis: met criteria w/ tachycardia, tachypnea & pneumonia. Continue on IV abxs. Blood cxs NGTD. Severe sepsis r/o    Tachyarrhythmia: weaned off of IV diltiazem. Continue on tele   Hypotension: resolved    AKI on CKDIIIa: baseline Cr 1.13. Cr is labile   Hypokalemia: potassium given  Hypomagnesemia: mg sulfate given    Morbid obesity: BMI 45.2. Complicates overall care & prognosis    Hx of breast cancer: s/p chemo, radiation in 2016    Thrombocytopenia: etiology unclear. Will continue to monitor      DVT prophylaxis: IV heparin  Code Status: full  Family Communication: Disposition Plan:  depends on PT/OT recs (not consulted yet)   Level of care: Stepdown  Status is: Inpatient Remains inpatient appropriate because: severity of illness     Consultants:  Cardio  Vasc surg  Procedures:  Antimicrobials: cefepime, azithromycin     Subjective: Pt c/o shortness of breath   Objective: Vitals:   07/31/22 0641 07/31/22 0900 07/31/22 1045 07/31/22 1230  BP:  94/63 109/88 97/79  Pulse: (!) 102 86 69 (!) 102  Resp:  (!) 28 (!) 32 (!) 31  Temp:      TempSrc:      SpO2:  96% 100% 100%  Weight:      Height:       No intake or output data in the 24 hours ending 07/31/22 1408 Filed Weights   07/30/22 2054  Weight: 114 kg    Examination:  General exam: Appears calm and comfortable  Respiratory system: diminished breath sounds b/l  Cardiovascular system: S1 & S2+. No rubs, gallops or clicks Gastrointestinal system: Abdomen is nondistended, soft and nontender. Normal bowel sounds heard. Central nervous system: Alert and oriented. Moves all extremities  Psychiatry: Judgement and insight appear normal. Mood & affect appropriate.     Data Reviewed: I have personally reviewed following labs and imaging studies  CBC: Recent Labs  Lab 07/30/22 2053 07/31/22 1108  WBC 19.0* 18.1*  HGB 13.3 11.8*  HCT 39.7 35.8*  MCV 95.2 97.0  PLT 148* 136*   Basic Metabolic Panel: Recent Labs  Lab 07/30/22 2053 07/31/22 0815 07/31/22 1108  NA 133*  --  136  K 3.0*  --  3.1*  CL 100  --  102  CO2 22  --  27  GLUCOSE 157*  --  131*  BUN 20  --  17  CREATININE 1.60*  --  1.44*  CALCIUM 8.7*  --  7.8*  MG  --  1.2*  --    GFR: Estimated Creatinine Clearance: 37 mL/min (A) (by C-G formula based on SCr of 1.44 mg/dL (H)). Liver Function Tests: Recent Labs  Lab 07/30/22 2053  AST 34  ALT 15  ALKPHOS 54  BILITOT 1.5*  PROT 6.9  ALBUMIN 3.4*   No results for input(s): "LIPASE", "AMYLASE" in the last 168 hours. No results for input(s): "AMMONIA" in the last 168 hours. Coagulation Profile: Recent Labs  Lab 07/30/22 2053  INR 1.1   Cardiac Enzymes: No results for input(s): "CKTOTAL", "CKMB", "CKMBINDEX", "TROPONINI" in the last 168 hours. BNP (last 3 results) No results for input(s): "PROBNP" in the  last 8760 hours. HbA1C: No results for input(s): "HGBA1C" in the last 72 hours. CBG: No results for input(s): "GLUCAP" in the last 168 hours. Lipid Profile: No results for input(s): "CHOL", "HDL", "LDLCALC", "TRIG", "CHOLHDL", "LDLDIRECT" in the last 72 hours. Thyroid Function Tests: Recent Labs    07/31/22 0815  TSH 5.070*   Anemia Panel: No results for input(s): "VITAMINB12", "FOLATE", "FERRITIN", "TIBC", "IRON", "RETICCTPCT" in the last 72 hours. Sepsis Labs: Recent Labs  Lab 07/30/22 2220 07/31/22 0815  PROCALCITON  --  0.53  LATICACIDVEN 1.6  --     Recent Results (from the past 240 hour(s))  Culture, blood (routine x 2)     Status: None (Preliminary result)   Collection Time: 07/30/22 10:20 PM   Specimen: BLOOD  Result Value Ref Range Status   Specimen Description BLOOD BLOOD RIGHT ARM  Final   Special Requests   Final    BOTTLES DRAWN AEROBIC AND ANAEROBIC Blood Culture results may not be optimal due to an inadequate volume of blood received in culture bottles   Culture   Final    NO GROWTH < 12 HOURS Performed at [facility], [address] [place], [place] [address]    Report Status PENDING  Incomplete  Culture, blood (routine x 2)     Status: None (Preliminary result)   Collection Time: 07/30/22 10:21 PM   Specimen: BLOOD  Result Value Ref Range Status   Specimen Description BLOOD BLOOD LEFT ARM  Final   Special Requests   Final    BOTTLES DRAWN AEROBIC AND ANAEROBIC Blood Culture results may not be optimal due to an inadequate volume of blood received in culture bottles   Culture   Final    NO GROWTH < 12 HOURS Performed at [facility], [address] [place], [place] [address]    Report Status PENDING  Incomplete  MRSA Next Gen by PCR, Nasal     Status: None   Collection Time: 07/31/22 11:08 AM   Specimen: Nasal Mucosa; Nasal Swab  Result Value Ref Range Status   MRSA by PCR Next Gen NOT DETECTED NOT DETECTED Final    Comment:  (NOTE) The GeneXpert MRSA Assay (FDA approved for NASAL specimens only), is one component of a comprehensive MRSA colonization surveillance program. It is not intended to diagnose MRSA infection nor to guide or monitor treatment for MRSA infections. Test performance is not FDA approved in patients less than 45 years old. Performed at [facility], [address], [place], [place] [address]          Radiology Studies: CT HEAD WO CONTRAST ( )  Result Date: 07/31/2022 CLINICAL DATA:  82 year old female with altered mental status. Bilateral pulmonary emboli. EXAM: CT HEAD WITHOUT CONTRAST TECHNIQUE: Contiguous axial images were obtained from the base  of the skull through the vertex without intravenous contrast. RADIATION DOSE REDUCTION: This exam was performed according to the departmental dose-optimization program which includes automated exposure control, adjustment of the mA and/or kV according to patient size and/or use of iterative reconstruction technique. COMPARISON:  Previous head CT 06/21/2013. FINDINGS: Brain: No midline shift, ventriculomegaly, mass effect, evidence of mass lesion, intracranial hemorrhage or evidence of cortically based acute infarction. Patchy mild for age white matter hypodensity has increased since 2015. Otherwise normal gray-white differentiation. Mild basal ganglia vascular calcifications. Vascular: No suspicious intracranial vascular hyperdensity. Mild for age Calcified atherosclerosis at the skull base. Skull: No acute osseous abnormality identified. Sinuses/Orbits: Mild right maxillary alveolar recess mucosal thickening appears to be in conjunction with carious or extracted right maxillary anterior molar. Other visualized paranasal sinuses and mastoids are clear. Other: No acute orbit or scalp soft tissue finding. IMPRESSION: 1. No acute intracranial abnormality. Mild for age white matter changes most commonly due to chronic small vessel disease. 2. Mild  odontogenic right maxillary sinus disease. Electronically Signed   By: Odessa Fleming M.D.   On: 07/31/2022 09:55   CT Angio Chest PE W/Cm &/Or Wo Cm  Result Date: 07/30/2022 CLINICAL DATA:  High probability for PE.  Shortness of breath. EXAM: CT ANGIOGRAPHY CHEST WITH CONTRAST TECHNIQUE: Multidetector CT imaging of the chest was performed using the standard protocol during bolus administration of intravenous contrast. Multiplanar CT image reconstructions and MIPs were obtained to evaluate the vascular anatomy. RADIATION DOSE REDUCTION: This exam was performed according to the departmental dose-optimization program which includes automated exposure control, adjustment of the mA and/or kV according to patient size and/or use of iterative reconstruction technique. CONTRAST:  75mL OMNIPAQUE IOHEXOL 350 MG/ML SOLN COMPARISON:  CT abdomen and pelvis 06/23/2022. FINDINGS: Cardiovascular: There is adequate opacification of the pulmonary arteries. There are segmental right lower lobe and right middle lobe pulmonary emboli. There also segmental left lower lobe pulmonary emboli. Heart is normal in size. There is no pericardial effusion. Aorta is normal in size. There are atherosclerotic calcifications of the aorta. Mediastinum/Nodes: No enlarged mediastinal, hilar, or axillary lymph nodes. Thyroid gland, trachea, and esophagus demonstrate no significant findings. Lungs/Pleura: There are minimal scattered ground-glass and peripheral interstitial opacities throughout both lungs. There is no pleural effusion or pneumothorax. Trachea and central airways are patent. Upper Abdomen: No acute abnormality. Left renal cysts partially imaged. Cholecystectomy clips are present. Musculoskeletal: Vertebroplasty changes are seen at T8. Review of the MIP images confirms the above findings. IMPRESSION: 1. Bilateral segmental pulmonary emboli involving the right middle lobe, right lower lobe and left lower lobe. Positive for acute PE with  CTevidence of right heart strain (RV/LV Ratio = 1.1) consistent with at least submassive (intermediate risk) PE. The presence of right heart strain has been associated with an increased risk of morbidity and mortality. 2. Minimal scattered ground-glass and peripheral interstitial opacities throughout both lungs, likely infectious/inflammatory. Aortic Atherosclerosis (ICD10-I70.0). These results were called by telephone at the time of interpretation on 07/30/2022 at 11:16 pm to provider [facility] , who verbally acknowledged these results. Electronically Signed   By: Darliss Cheney M.D.   On: 07/30/2022 23:16   DG Chest Port 1 View  Result Date: 07/30/2022 CLINICAL DATA:  Shortness of breath.  Fever EXAM: PORTABLE CHEST 1 VIEW COMPARISON:  June 07, 2022 FINDINGS: No pneumothorax. No pneumothorax. The cardiomediastinal silhouette is stable. Bilateral increased lung markings are largely stable. Mildly more focal opacity in the left mid lung. The retrocardiac  region on the left is not well assessed due to lack of lateral imaging. No other abnormalities. IMPRESSION: 1. Focal opacity in left mid lung could represent developing infiltrate/pneumonia given history. Recommend short-term follow-up imaging to ensure resolution. 2. Increase interstitial markings bilaterally are stable. This could represent a chronic process given stability, recurrent pulmonary edema, or an atypical infectious process. Electronically Signed   By: Gerome Sam III M.D.   On: 07/30/2022 21:36        Scheduled Meds:  potassium chloride  20 mEq Oral Once   tamoxifen  20 mg Oral Daily   venlafaxine XR  75 mg Oral Q breakfast   Continuous Infusions:  [START ON 08/01/2022] azithromycin     ceFEPime (MAXIPIME) IV Stopped (07/31/22 1249)   diltiazem (CARDIZEM) infusion Stopped (07/31/22 0317)   heparin 1,550 Units/hr (07/31/22 1029)   lactated ringers 75 mL/hr at 07/31/22 1009   magnesium sulfate bolus IVPB       LOS: 1 day     Time spent: 35 mins     Charise Killian, MD [facility] Pager [phone]  If 7PM-7AM, please contact night-coverage [email or web address] 07/31/2022, 2:08 PM

## 2022-07-31 NOTE — Assessment & Plan Note (Addendum)
Patient intermittently hypotensive since being placed on Cardizem Question whether related to decompensation from submassive PE versus Cardizem Will titrate off Cardizem drip as tachyarrhythmia was believed related to acute medical condition Continue to monitor blood pressure Hold off all home antihypertensives Will place patient in stepdown

## 2022-07-31 NOTE — Progress Notes (Signed)
Pt admitted to ICU 15. A&OX4. Denies Pain. A-fib rate 140-160s and MD made aware. New orders received and cardizem gtt started. Rings sent home with husband.

## 2022-07-31 NOTE — Assessment & Plan Note (Signed)
Creatinine 1.6 up from baseline of 1.13 Expecting improvement with hydration

## 2022-07-31 NOTE — Assessment & Plan Note (Signed)
Possible severe sepsis versus SIRS Patient with low-grade temp, tachycardia, fever and chills and congestion s/p hospitalization a month prior Rocephin, azithromycin and vancomycin Follow blood cultures Sepsis fluids to progress cautiously with monitoring for fluid overload given RV strain Daily weights with intake and output monitoring Will get procalcitonin.  If not consistent with sepsis, will decrease fluid

## 2022-07-31 NOTE — Progress Notes (Signed)
Pharmacy Antibiotic Note  Maria Gutierrez is a 82 y.o. female admitted on 07/30/2022 with pneumonia.  Pharmacy has been consulted for Cefepime & Vancomycin dosing for 7 days.  Plan: Cefepime 2 gm q12hr per indication & renal fxn.  Pt given Vancomycin 2500 mg once.Possible AKI, dosing interval > 24 hrs.  Variable vancomycin dosing per pharmacy. BMI: 51.3  Pharmacy will continue to follow and will adjust abx dosing whenever warranted.  Temp (24hrs), Avg:100 F (37.8 C), Min:100 F (37.8 C), Max:100 F (37.8 C)   Recent Labs  Lab 07/30/22 2053 07/30/22 2220  WBC 19.0*  --   CREATININE 1.60*  --   LATICACIDVEN  --  1.6    Estimated Creatinine Clearance: 33.3 mL/min (A) (by C-G formula based on SCr of 1.6 mg/dL (H)).    No Known Allergies  Antimicrobials this admission: 6/02 Azithromycin >> x 1 dose 6/02 Ceftriaxone >> x 1 dose 6/02 Cefepime >> x 7 days 6/02 Vancomycin >> x 7 days  Microbiology results: 6/01 BCx: Pending  Thank you for allowing pharmacy to be a part of this patient's care.  Otelia Sergeant, PharmD, [facility] 07/31/2022 3:48 AM

## 2022-07-31 NOTE — Assessment & Plan Note (Signed)
Patient with narrow complex tachycardia treated as A-fib in the ED with Cardizem infusion with precipitous BP drop Personal read is not A-fib.  Suspecting tachyarrhythmia related to acute medical condition In the setting of right heart strain, and BP drop will wean off Cardizem Treat sepsis Monitor for acute heart failure in view of submassive PE

## 2022-07-31 NOTE — Assessment & Plan Note (Signed)
s/p of radiation and chemotherapy 2016 No acute issues suspected

## 2022-08-01 ENCOUNTER — Inpatient Hospital Stay: Payer: Medicare Other

## 2022-08-01 ENCOUNTER — Inpatient Hospital Stay (HOSPITAL_COMMUNITY)
Admit: 2022-08-01 | Discharge: 2022-08-01 | Disposition: A | Discharge status: Home / Self Care | Payer: Medicare Other | Attending: Internal Medicine | Admitting: Internal Medicine

## 2022-08-01 DIAGNOSIS — J189 Pneumonia, unspecified organism: Secondary | ICD-10-CM

## 2022-08-01 DIAGNOSIS — I4891 Unspecified atrial fibrillation: Principal | ICD-10-CM

## 2022-08-01 DIAGNOSIS — I509 Heart failure, unspecified: Secondary | ICD-10-CM | POA: Diagnosis not present

## 2022-08-01 DIAGNOSIS — I2699 Other pulmonary embolism without acute cor pulmonale: Secondary | ICD-10-CM

## 2022-08-01 LAB — ECHOCARDIOGRAM COMPLETE
AR max vel: 2.29 cm2
AV Area VTI: 2.27 cm2
AV Area mean vel: 2.16 cm2
AV Mean grad: 3.5 mmHg
AV Peak grad: 6.7 mmHg
Ao pk vel: 1.29 m/s
Area-P 1/2: 3.8 cm2
Height: 62.5 in
MV VTI: 2.06 cm2
S' Lateral: 2.8 cm
Weight: 4444.47 [oz_av]

## 2022-08-01 LAB — BASIC METABOLIC PANEL
Anion gap: 7 (ref 5–15)
BUN: 14 mg/dL (ref 8–23)
CO2: 27 mmol/L (ref 22–32)
Calcium: 7.9 mg/dL — ABNORMAL LOW (ref 8.9–10.3)
Chloride: 104 mmol/L (ref 98–111)
Creatinine, Ser: 1.28 mg/dL — ABNORMAL HIGH (ref 0.44–1.00)
GFR, Estimated: 42 mL/min — ABNORMAL LOW (ref >60)
Glucose, Bld: 119 mg/dL — ABNORMAL HIGH (ref 70–99)
Potassium: 3.1 mmol/L — ABNORMAL LOW (ref 3.5–5.1)
Sodium: 138 mmol/L (ref 135–145)

## 2022-08-01 LAB — HEPARIN LEVEL (UNFRACTIONATED)
Heparin Unfractionated: 0.51 IU/mL (ref 0.30–0.70)
Heparin Unfractionated: 0.51 IU/mL (ref 0.30–0.70)

## 2022-08-01 LAB — CBC
HCT: 36.3 % (ref 36.0–46.0)
Hemoglobin: 12 g/dL (ref 12.0–15.0)
MCH: 31.6 pg (ref 26.0–34.0)
MCHC: 33.1 g/dL (ref 30.0–36.0)
MCV: 95.5 fL (ref 80.0–100.0)
Platelets: 136 10*3/uL — ABNORMAL LOW (ref 150–400)
RBC: 3.8 MIL/uL — ABNORMAL LOW (ref 3.87–5.11)
RDW: 12.5 % (ref 11.5–15.5)
WBC: 14.8 10*3/uL — ABNORMAL HIGH (ref 4.0–10.5)
nRBC: 0 % (ref 0.0–0.2)

## 2022-08-01 LAB — CULTURE, BLOOD (ROUTINE X 2)

## 2022-08-01 MED ORDER — POTASSIUM CHLORIDE CRYS ER 20 MEQ PO TBCR
40.0000 meq | EXTENDED_RELEASE_TABLET | Freq: Once | ORAL | Status: DC
  Filled 2022-08-01: qty 2

## 2022-08-01 MED ORDER — LEVALBUTEROL HCL 0.63 MG/3ML IN NEBU
0.6300 mg | INHALATION_SOLUTION | Freq: Three times a day (TID) | RESPIRATORY_TRACT | Status: DC
  Administered 2022-08-01 – 2022-08-02 (×5): 0.63 mg via RESPIRATORY_TRACT
  Filled 2022-08-01 (×5): qty 3

## 2022-08-01 MED ORDER — POTASSIUM CHLORIDE 10 MEQ/100ML IV SOLN
10.0000 meq | INTRAVENOUS | Status: AC
  Administered 2022-08-01 (×5): 10 meq via INTRAVENOUS
  Filled 2022-08-01 (×5): qty 100

## 2022-08-01 MED ORDER — ADULT MULTIVITAMIN W/MINERALS CH
1.0000 | ORAL_TABLET | Freq: Every day | ORAL | Status: DC
  Administered 2022-08-04 – 2022-08-05 (×2): 1 via ORAL
  Filled 2022-08-01 (×4): qty 1

## 2022-08-01 MED ORDER — METOPROLOL TARTRATE 5 MG/5ML IV SOLN: 5.0000 mg | Freq: Four times a day (QID) | INTRAVENOUS | Status: DC | PRN

## 2022-08-01 MED ORDER — METOPROLOL TARTRATE 25 MG PO TABS
25.0000 mg | ORAL_TABLET | Freq: Four times a day (QID) | ORAL | Status: DC
  Administered 2022-08-01 – 2022-08-03 (×7): 25 mg via ORAL
  Filled 2022-08-01 (×7): qty 1

## 2022-08-01 MED ORDER — ENSURE ENLIVE PO LIQD
237.0000 mL | Freq: Three times a day (TID) | ORAL | Status: DC
  Administered 2022-08-01 – 2022-08-05 (×8): 237 mL via ORAL

## 2022-08-01 MED ORDER — TRAMADOL HCL 50 MG PO TABS: 50.0000 mg | ORAL_TABLET | Freq: Four times a day (QID) | ORAL | Status: DC | PRN

## 2022-08-01 MED ORDER — POTASSIUM CHLORIDE CRYS ER 20 MEQ PO TBCR: 40.0000 meq | EXTENDED_RELEASE_TABLET | Freq: Two times a day (BID) | ORAL | Status: DC

## 2022-08-01 MED ORDER — POTASSIUM CHLORIDE 10 MEQ/100ML IV SOLN
10.0000 meq | INTRAVENOUS | Status: AC
  Administered 2022-08-01 (×2): 10 meq via INTRAVENOUS
  Filled 2022-08-01 (×2): qty 100

## 2022-08-01 NOTE — Progress Notes (Signed)
PROGRESS NOTE    Maria Gutierrez  QMV:[id number] DOB: 11-02-1940 DOA: 07/30/2022 PCP: Jerl Mina, MD   Assessment & Plan:   Principal Problem:   Acute massive pulmonary embolism (HCC) Active Problems:   HCAP (healthcare-associated pneumonia)   Severe sepsis (HCC)   Acute respiratory failure with hypoxia (HCC)   Tachyarrhythmia   Hypotension   Acute renal failure superimposed on stage 3a chronic kidney disease (HCC)   Obesity, Class III, BMI 40-49.9 (morbid obesity) (HCC)   Carcinoma of overlapping sites of right breast in female, estrogen receptor positive (HCC)   Pulmonary embolism, bilateral (HCC)   Acute respiratory distress  Assessment and Plan:  Acute massive pulmonary embolism: w/ right heart strain. Echo shows EF 60-6%, diastolic function could not be evaluated, right ventricular systolic function is normal & mildly elevated pulmonary artery systolic pressure. Continue on IV heparin. Will go for thrombectomy 08/02/22. Vasc surg following and recs apprec.  Acute hypoxic respiratory failure: likely secondary to PE & pneumonia. Continue on IV heparin & abxs. Continue on supplemental oxygen and wean as tolerated    HCAP:continue on IV cefepime, azithromycin. D/c IV vanco. Continue on bronchodilators & encourage incentive spirometry  Sepsis: met criteria w/ tachycardia, tachypnea & pneumonia. Continue on IV abxs. Blood cxs NGTD. Severe sepsis r/o    Tachyarrhythmia: weaned off of IV diltiazem. Continue on tele. Cardio following and recs apprec   Hypotension: resolved    AKI on CKDIIIa: baseline Cr 1.13. Cr is trending down today   Hypokalemia: KCl repleated   Hypomagnesemia: mg given    Morbid obesity: BMI 45.2. Complicates overall care & prognosis    Hx of breast cancer: s/p chemo, radiation in 2016    Thrombocytopenia: etiology unclear. Will continue to monitor      DVT prophylaxis: IV heparin  Code Status: full  Family Communication: Disposition Plan:   depends on PT/OT recs (not consulted yet)   Level of care: Stepdown  Status is: Inpatient Remains inpatient appropriate because: severity of illness     Consultants:  Cardio  Vasc surg  Procedures:  Antimicrobials: cefepime, azithromycin    Subjective: Pt c/o wheezing   Objective: Vitals:   08/01/22 0515 08/01/22 0530 08/01/22 0615 08/01/22 0734  BP: (!) 119/107 107/75 125/82 (!) 129/97  Pulse: (!) 106 (!) 104 (!) 111 (!) 109  Resp: (!) 24 (!) 25 (!) 28 (!) 22  Temp:    98.8 F (37.1 C)  TempSrc:    Oral  SpO2: 98% 99% 98% 100%  Weight:      Height:        Intake/Output Summary (Last 24 hours) at 08/01/2022 0835 Last data filed at 08/01/2022 0600 Gross per 24 hour  Intake 2579.59 ml  Output 600 ml  Net 1979.59 ml   Filed Weights   07/30/22 2054 07/31/22 1813  Weight: 114 kg 126 kg    Examination:  General exam: Appears calm but uncomfortable   Respiratory system: course breath sounds b/l. Wheezes b/l  Cardiovascular system: S1 & S2+. No rubs or gallops Gastrointestinal system: Abd is soft, NT, obese & hypoactive bowel sounds Central nervous system:alert and oriented. Moves all extremities  Psychiatry: Judgement and insight appears normal. Appropriate mood and affect      Data Reviewed: I have personally reviewed following labs and imaging studies  CBC: Recent Labs  Lab 07/30/22 2053 07/31/22 1108 08/01/22 0508  WBC 19.0* 18.1* 14.8*  HGB 13.3 11.8* 12.0  HCT 39.7 35.8* 36.3  MCV 95.2 97.0  95.5  PLT 148* 136* 136*   Basic Metabolic Panel: Recent Labs  Lab 07/30/22 2053 07/31/22 0815 07/31/22 1108 07/31/22 2315 08/01/22 0508  NA 133*  --  136 135 138  K 3.0*  --  3.1* 3.1* 3.1*  CL 100  --  102 101 104  CO2 22  --  27 26 27   GLUCOSE 157*  --  131* 130* 119*  BUN 20  --  17 15 14   CREATININE 1.60*  --  1.44* 1.35* 1.28*  CALCIUM 8.7*  --  7.8* 8.0* 7.9*  MG  --  1.2*  --  2.1  --    GFR: Estimated Creatinine Clearance: 44.2 mL/min  (A) (by C-G formula based on SCr of 1.28 mg/dL (H)). Liver Function Tests: Recent Labs  Lab 07/30/22 2053  AST 34  ALT 15  ALKPHOS 54  BILITOT 1.5*  PROT 6.9  ALBUMIN 3.4*   No results for input(s): "LIPASE", "AMYLASE" in the last 168 hours. No results for input(s): "AMMONIA" in the last 168 hours. Coagulation Profile: Recent Labs  Lab 07/30/22 2053  INR 1.1   Cardiac Enzymes: No results for input(s): "CKTOTAL", "CKMB", "CKMBINDEX", "TROPONINI" in the last 168 hours. BNP (last 3 results) No results for input(s): "PROBNP" in the last 8760 hours. HbA1C: No results for input(s): "HGBA1C" in the last 72 hours. CBG: Recent Labs  Lab 07/31/22 1814  GLUCAP 134*   Lipid Profile: No results for input(s): "CHOL", "HDL", "LDLCALC", "TRIG", "CHOLHDL", "LDLDIRECT" in the last 72 hours. Thyroid Function Tests: Recent Labs    07/31/22 0815  TSH 5.070*   Anemia Panel: No results for input(s): "VITAMINB12", "FOLATE", "FERRITIN", "TIBC", "IRON", "RETICCTPCT" in the last 72 hours. Sepsis Labs: Recent Labs  Lab 07/30/22 2220 07/31/22 0815  PROCALCITON  --  0.53  LATICACIDVEN 1.6  --     Recent Results (from the past 240 hour(s))  Culture, blood (routine x 2)     Status: None (Preliminary result)   Collection Time: 07/30/22 10:20 PM   Specimen: BLOOD  Result Value Ref Range Status   Specimen Description BLOOD BLOOD RIGHT ARM  Final   Special Requests   Final    BOTTLES DRAWN AEROBIC AND ANAEROBIC Blood Culture results may not be optimal due to an inadequate volume of blood received in culture bottles   Culture   Final    NO GROWTH 2 DAYS Performed at [facility], [address], [place], [place] [address]    Report Status PENDING  Incomplete  Culture, blood (routine x 2)     Status: None (Preliminary result)   Collection Time: 07/30/22 10:21 PM   Specimen: BLOOD  Result Value Ref Range Status   Specimen Description BLOOD BLOOD LEFT ARM  Final   Special  Requests   Final    BOTTLES DRAWN AEROBIC AND ANAEROBIC Blood Culture results may not be optimal due to an inadequate volume of blood received in culture bottles   Culture   Final    NO GROWTH 2 DAYS Performed at [facility], [address] [place], [place] [address]    Report Status PENDING  Incomplete  MRSA Next Gen by PCR, Nasal     Status: None   Collection Time: 07/31/22 11:08 AM   Specimen: Nasal Mucosa; Nasal Swab  Result Value Ref Range Status   MRSA by PCR Next Gen NOT DETECTED NOT DETECTED Final    Comment: (NOTE) The GeneXpert MRSA Assay (FDA approved for NASAL specimens  only), is one component of a comprehensive MRSA colonization surveillance program. It is not intended to diagnose MRSA infection nor to guide or monitor treatment for MRSA infections. Test performance is not FDA approved in patients less than 38 years old. Performed at [facility], [address] [place], [place] [address]          Radiology Studies: DG Chest [place] 1 View  Result Date: 08/01/2022 CLINICAL DATA:  Short of breath, breast cancer EXAM: PORTABLE CHEST 1 VIEW COMPARISON:  07/30/2022 FINDINGS: Single frontal view of the chest demonstrates continued enlargement of the cardiac silhouette. Stable ectasia of the thoracic aorta, accentuated by apical lordotic positioning. Stable areas of parenchymal scarring and subpleural fibrosis. No focal consolidation, effusion, or pneumothorax. No acute bony abnormalities. IMPRESSION: 1. Stable enlarged cardiac silhouette and thoracic aortic ectasia. 2. Bilateral parenchymal lung scarring and fibrosis. No acute airspace disease. Electronically Signed   By: Sharlet Salina M.D.   On: 08/01/2022 01:48   CT HEAD WO CONTRAST ( )  Result Date: 07/31/2022 CLINICAL DATA:  82 year old female with altered mental status. Bilateral pulmonary emboli. EXAM: CT HEAD WITHOUT CONTRAST TECHNIQUE: Contiguous axial images were obtained from the base of the  skull through the vertex without intravenous contrast. RADIATION DOSE REDUCTION: This exam was performed according to the departmental dose-optimization program which includes automated exposure control, adjustment of the mA and/or kV according to patient size and/or use of iterative reconstruction technique. COMPARISON:  Previous head CT 06/21/2013. FINDINGS: Brain: No midline shift, ventriculomegaly, mass effect, evidence of mass lesion, intracranial hemorrhage or evidence of cortically based acute infarction. Patchy mild for age white matter hypodensity has increased since 2015. Otherwise normal gray-white differentiation. Mild basal ganglia vascular calcifications. Vascular: No suspicious intracranial vascular hyperdensity. Mild for age Calcified atherosclerosis at the skull base. Skull: No acute osseous abnormality identified. Sinuses/Orbits: Mild right maxillary alveolar recess mucosal thickening appears to be in conjunction with carious or extracted right maxillary anterior molar. Other visualized paranasal sinuses and mastoids are clear. Other: No acute orbit or scalp soft tissue finding. IMPRESSION: 1. No acute intracranial abnormality. Mild for age white matter changes most commonly due to chronic small vessel disease. 2. Mild odontogenic right maxillary sinus disease. Electronically Signed   By: Odessa Fleming M.D.   On: 07/31/2022 09:55   CT Angio Chest PE W/Cm &/Or Wo Cm  Result Date: 07/30/2022 CLINICAL DATA:  High probability for PE.  Shortness of breath. EXAM: CT ANGIOGRAPHY CHEST WITH CONTRAST TECHNIQUE: Multidetector CT imaging of the chest was performed using the standard protocol during bolus administration of intravenous contrast. Multiplanar CT image reconstructions and MIPs were obtained to evaluate the vascular anatomy. RADIATION DOSE REDUCTION: This exam was performed according to the departmental dose-optimization program which includes automated exposure control, adjustment of the mA and/or kV  according to patient size and/or use of iterative reconstruction technique. CONTRAST:  75mL OMNIPAQUE IOHEXOL 350 MG/ML SOLN COMPARISON:  CT abdomen and pelvis 06/23/2022. FINDINGS: Cardiovascular: There is adequate opacification of the pulmonary arteries. There are segmental right lower lobe and right middle lobe pulmonary emboli. There also segmental left lower lobe pulmonary emboli. Heart is normal in size. There is no pericardial effusion. Aorta is normal in size. There are atherosclerotic calcifications of the aorta. Mediastinum/Nodes: No enlarged mediastinal, hilar, or axillary lymph nodes. Thyroid gland, trachea, and esophagus demonstrate no significant findings. Lungs/Pleura: There are minimal scattered ground-glass and peripheral interstitial opacities throughout both lungs. There is no pleural effusion or pneumothorax. Trachea and  central airways are patent. Upper Abdomen: No acute abnormality. Left renal cysts partially imaged. Cholecystectomy clips are present. Musculoskeletal: Vertebroplasty changes are seen at T8. Review of the MIP images confirms the above findings. IMPRESSION: 1. Bilateral segmental pulmonary emboli involving the right middle lobe, right lower lobe and left lower lobe. Positive for acute PE with CTevidence of right heart strain (RV/LV Ratio = 1.1) consistent with at least submassive (intermediate risk) PE. The presence of right heart strain has been associated with an increased risk of morbidity and mortality. 2. Minimal scattered ground-glass and peripheral interstitial opacities throughout both lungs, likely infectious/inflammatory. Aortic Atherosclerosis (ICD10-I70.0). These results were called by telephone at the time of interpretation on 07/30/2022 at 11:16 pm to provider [facility] , who verbally acknowledged these results. Electronically Signed   By: Darliss Cheney M.D.   On: 07/30/2022 23:16   DG Chest Port 1 View  Result Date: 07/30/2022 CLINICAL DATA:  Shortness of  breath.  Fever EXAM: PORTABLE CHEST 1 VIEW COMPARISON:  June 07, 2022 FINDINGS: No pneumothorax. No pneumothorax. The cardiomediastinal silhouette is stable. Bilateral increased lung markings are largely stable. Mildly more focal opacity in the left mid lung. The retrocardiac region on the left is not well assessed due to lack of lateral imaging. No other abnormalities. IMPRESSION: 1. Focal opacity in left mid lung could represent developing infiltrate/pneumonia given history. Recommend short-term follow-up imaging to ensure resolution. 2. Increase interstitial markings bilaterally are stable. This could represent a chronic process given stability, recurrent pulmonary edema, or an atypical infectious process. Electronically Signed   By: Gerome Sam III M.D.   On: 07/30/2022 21:36        Scheduled Meds:  Chlorhexidine Gluconate Cloth  6 each Topical Q0600   potassium chloride  40 mEq Oral BID   tamoxifen  20 mg Oral Daily   venlafaxine XR  75 mg Oral Q breakfast   Continuous Infusions:  azithromycin Stopped (08/01/22 0006)   ceFEPime (MAXIPIME) IV Stopped (07/31/22 2151)   diltiazem (CARDIZEM) infusion 15 mg/hr (08/01/22 0600)   heparin 1,800 Units/hr (08/01/22 0600)   lactated ringers 75 mL/hr at 08/01/22 0600     LOS: 2 days    Time spent: 35 mins     Charise Killian, MD [facility] Pager [phone]  If 7PM-7AM, please contact night-coverage [email or web address] 08/01/2022, 8:35 AM

## 2022-08-01 NOTE — Progress Notes (Signed)
Rounding Note    Patient Name: Maria Gutierrez Date of Encounter: 08/01/2022  [facility] Cardiologist: None   Subjective   She developed atrial fibrillation with rapid ventricular response yesterday evening and was started on diltiazem drip and transferred to the ICU.  She is no longer hypotensive and if anything her blood pressure is elevated.  She denies chest pain but reports shortness of breath and palpitations.  Inpatient Medications    Scheduled Meds:  Chlorhexidine Gluconate Cloth  6 each Topical Q0600   feeding supplement  237 mL Oral TID BM   levalbuterol  0.63 mg Nebulization Q8H   metoprolol tartrate  25 mg Oral Q6H   [START ON 08/02/2022] multivitamin with minerals  1 tablet Oral Daily   tamoxifen  20 mg Oral Daily   venlafaxine XR  75 mg Oral Q breakfast   Continuous Infusions:  azithromycin Stopped (08/01/22 0006)   ceFEPime (MAXIPIME) IV Stopped (08/01/22 0930)   diltiazem (CARDIZEM) infusion 15 mg/hr (08/01/22 1535)   heparin 1,800 Units/hr (08/01/22 1535)   lactated ringers 75 mL/hr at 08/01/22 1535   PRN Meds: acetaminophen **OR** acetaminophen, metoprolol tartrate, ondansetron **OR** ondansetron (ZOFRAN) IV   Vital Signs    Vitals:   08/01/22 1159 08/01/22 1200 08/01/22 1330 08/01/22 1400  BP:  (!) 165/143 (!) 150/75 (!) 170/88  Pulse:  98 (!) 34 (!) 149  Resp:  (!) 28 (!) 27 20  Temp: 98.5 F (36.9 C)     TempSrc: Oral     SpO2:  98% 97% 95%  Weight:      Height:        Intake/Output Summary (Last 24 hours) at 08/01/2022 1551 Last data filed at 08/01/2022 1535 Gross per 24 hour  Intake 4391.96 ml  Output 1100 ml  Net 3291.96 ml      07/31/2022    6:13 PM 07/30/2022    8:54 PM 06/07/2022    8:52 AM  Last 3 Weights  Weight (lbs) 277 lb 12.5 oz 251 lb 5.2 oz 250 lb  Weight (kg) 126 kg 114 kg 113.399 kg      Telemetry    Atrial fibrillation with RVR- Personally Reviewed  ECG     - Personally Reviewed  Physical Exam   GEN: No  acute distress.   Neck: No JVD Cardiac: Irregularly irregular mildly tachycardic, no murmurs, rubs, or gallops.  Respiratory: Clear to auscultation bilaterally. GI: Soft, nontender, non-distended  MS: No edema; No deformity. Neuro:  Nonfocal  Psych: Normal affect   Labs    High Sensitivity Troponin:   Recent Labs  Lab 07/31/22 0105 07/31/22 0319  TROPONINIHS 24* 25*     Chemistry Recent Labs  Lab 07/30/22 2053 07/31/22 0815 07/31/22 1108 07/31/22 2315 08/01/22 0508  NA 133*  --  136 135 138  K 3.0*  --  3.1* 3.1* 3.1*  CL 100  --  102 101 104  CO2 22  --  27 26 27   GLUCOSE 157*  --  131* 130* 119*  BUN 20  --  17 15 14   CREATININE 1.60*  --  1.44* 1.35* 1.28*  CALCIUM 8.7*  --  7.8* 8.0* 7.9*  MG  --  1.2*  --  2.1  --   PROT 6.9  --   --   --   --   ALBUMIN 3.4*  --   --   --   --   AST 34  --   --   --   --  ALT 15  --   --   --   --   ALKPHOS 54  --   --   --   --   BILITOT 1.5*  --   --   --   --   GFRNONAA 32*  --  37* 39* 42*  ANIONGAP 11  --  7 8 7     Lipids No results for input(s): "CHOL", "TRIG", "HDL", "LABVLDL", "LDLCALC", "CHOLHDL" in the last 168 hours.  Hematology Recent Labs  Lab 07/30/22 2053 07/31/22 1108 08/01/22 0508  WBC 19.0* 18.1* 14.8*  RBC 4.17 3.69* 3.80*  HGB 13.3 11.8* 12.0  HCT 39.7 35.8* 36.3  MCV 95.2 97.0 95.5  MCH 31.9 32.0 31.6  MCHC 33.5 33.0 33.1  RDW 12.4 12.5 12.5  PLT 148* 136* 136*   Thyroid  Recent Labs  Lab 07/31/22 0815  TSH 5.070*    BNP Recent Labs  Lab 07/30/22 2053  BNP 158.9*    DDimer No results for input(s): "DDIMER" in the last 168 hours.   Radiology    ECHOCARDIOGRAM COMPLETE  Result Date: 08/01/2022    ECHOCARDIOGRAM REPORT   Patient Name:   OMELIA VERMILYEA Date of Exam: 08/01/2022 Medical Rec #:  [id number]     Height:       62.5 in Accession #:    [id number]    Weight:       277.8 lb Date of Birth:  1941-01-21     BSA:          2.211 m Patient Age:    82 years      BP:           111/77 mmHg  Patient Gender: F             HR:           109 bpm. Exam Location:  [facility] Procedure: 2D Echo, Cardiac Doppler and Color Doppler Indications:     CHF  History:         Patient has no prior history of Echocardiogram examinations.                  CHF, Arrythmias:Tachycardia, Signs/Symptoms:Shortness of                  Breath; Risk Factors:Hypertension and Dyslipidemia. CKD, Breast                  CA, Pulmonary embolus.  Sonographer:     Mikki Harbor Referring Phys:  [id number] Andris Baumann Diagnosing Phys: Yvonne Kendall MD  Sonographer Comments: Patient is obese. IMPRESSIONS  1. Left ventricular ejection fraction, by estimation, is 60 to 65%. The left ventricle has normal function. Left ventricular endocardial border not optimally defined to evaluate regional wall motion. mild to moderate left ventricular hypertrophy. Left ventricular diastolic function could not be evaluated.  2. Right ventricular systolic function is normal. The right ventricular size is mildly enlarged. There is mildly elevated pulmonary artery systolic pressure.  3. The mitral valve is grossly normal. Trivial mitral valve regurgitation. No evidence of mitral stenosis.  4. The aortic valve is tricuspid. Aortic valve regurgitation is not visualized. No aortic stenosis is present.  5. There is borderline dilatation of the ascending aorta, measuring 36 mm. FINDINGS  Left Ventricle: Left ventricular ejection fraction, by estimation, is 60 to 65%. The left ventricle has normal function. Left ventricular endocardial border not optimally defined to evaluate regional wall motion. The left ventricular internal cavity size  was normal in size. Mild to moderate left ventricular hypertrophy. Left ventricular diastolic function could not be evaluated due to atrial fibrillation. Left ventricular diastolic function could not be evaluated. Right Ventricle: The right ventricular size is mildly enlarged. No increase in right ventricular wall thickness. Right  ventricular systolic function is normal. There is mildly elevated pulmonary artery systolic pressure. The tricuspid regurgitant velocity is 2.90 m/s, and with an assumed right atrial pressure of 8 mmHg, the estimated right ventricular systolic pressure is 41.6 mmHg. Left Atrium: Left atrial size was normal in size. Right Atrium: Right atrial size was normal in size. Pericardium: There is no evidence of pericardial effusion. Mitral Valve: The mitral valve is grossly normal. Mild to moderate mitral annular calcification. Trivial mitral valve regurgitation. No evidence of mitral valve stenosis. MV peak gradient, 5.8 mmHg. The mean mitral valve gradient is 2.0 mmHg. Tricuspid Valve: The tricuspid valve is normal in structure. Tricuspid valve regurgitation is mild. Aortic Valve: The aortic valve is tricuspid. Aortic valve regurgitation is not visualized. No aortic stenosis is present. Aortic valve mean gradient measures 3.5 mmHg. Aortic valve peak gradient measures 6.7 mmHg. Aortic valve area, by VTI measures 2.27 cm. Pulmonic Valve: The pulmonic valve was grossly normal. Pulmonic valve regurgitation is not visualized. No evidence of pulmonic stenosis. Aorta: The aortic root is normal in size and structure. There is borderline dilatation of the ascending aorta, measuring 36 mm. There is protruding plaque involving the ascending aorta. Pulmonary Artery: The pulmonary artery is of normal size. Venous: The inferior vena cava was not well visualized. IAS/Shunts: The interatrial septum was not well visualized.  LEFT VENTRICLE PLAX 2D LVIDd:         4.20 cm LVIDs:         2.80 cm LV PW:         1.30 cm LV IVS:        1.30 cm LVOT diam:     2.00 cm LV SV:         48 LV SV Index:   22 LVOT Area:     3.14 cm  RIGHT VENTRICLE RV Basal diam:  3.85 cm RV Mid diam:    3.90 cm RV S prime:     27.80 cm/s LEFT ATRIUM           Index        RIGHT ATRIUM           Index LA diam:      3.20 cm 1.45 cm/m   RA Area:     15.80 cm LA Vol  (A2C): 30.9 ml 13.97 ml/m  RA Volume:   49.00 ml  22.16 ml/m LA Vol (A4C): 32.1 ml 14.52 ml/m  AORTIC VALVE                    PULMONIC VALVE AV Area (Vmax):    2.29 cm     PV Vmax:       1.20 m/s AV Area (Vmean):   2.16 cm     PV Peak grad:  5.7 mmHg AV Area (VTI):     2.27 cm AV Vmax:           129.00 cm/s AV Vmean:          85.850 cm/s AV VTI:            0.211 m AV Peak Grad:      6.7 mmHg AV Mean Grad:      3.5 mmHg  LVOT Vmax:         94.20 cm/s LVOT Vmean:        59.100 cm/s LVOT VTI:          0.152 m LVOT/AV VTI ratio: 0.72  AORTA Ao Root diam: 3.40 cm Ao Asc diam:  3.60 cm MITRAL VALVE                TRICUSPID VALVE MV Area (PHT): 3.80 cm     TR Peak grad:   33.6 mmHg MV Area VTI:   2.06 cm     TR Vmax:        290.00 cm/s MV Peak grad:  5.8 mmHg MV Mean grad:  2.0 mmHg     SHUNTS MV Vmax:       1.20 m/s     Systemic VTI:  0.15 m MV Vmean:      54.5 cm/s    Systemic Diam: 2.00 cm MV Decel Time: 200 msec MV E velocity: 105.55 cm/s Yvonne Kendall MD Electronically signed by Yvonne Kendall MD Signature Date/Time: 08/01/2022/2:54:36 PM    Final    DG Chest Port 1 View  Result Date: 08/01/2022 CLINICAL DATA:  Short of breath, breast cancer EXAM: PORTABLE CHEST 1 VIEW COMPARISON:  07/30/2022 FINDINGS: Single frontal view of the chest demonstrates continued enlargement of the cardiac silhouette. Stable ectasia of the thoracic aorta, accentuated by apical lordotic positioning. Stable areas of parenchymal scarring and subpleural fibrosis. No focal consolidation, effusion, or pneumothorax. No acute bony abnormalities. IMPRESSION: 1. Stable enlarged cardiac silhouette and thoracic aortic ectasia. 2. Bilateral parenchymal lung scarring and fibrosis. No acute airspace disease. Electronically Signed   By: Sharlet Salina M.D.   On: 08/01/2022 01:48   CT HEAD WO CONTRAST ( )  Result Date: 07/31/2022 CLINICAL DATA:  82 year old female with altered mental status. Bilateral pulmonary emboli. EXAM: CT HEAD WITHOUT  CONTRAST TECHNIQUE: Contiguous axial images were obtained from the base of the skull through the vertex without intravenous contrast. RADIATION DOSE REDUCTION: This exam was performed according to the departmental dose-optimization program which includes automated exposure control, adjustment of the mA and/or kV according to patient size and/or use of iterative reconstruction technique. COMPARISON:  Previous head CT 06/21/2013. FINDINGS: Brain: No midline shift, ventriculomegaly, mass effect, evidence of mass lesion, intracranial hemorrhage or evidence of cortically based acute infarction. Patchy mild for age white matter hypodensity has increased since 2015. Otherwise normal gray-white differentiation. Mild basal ganglia vascular calcifications. Vascular: No suspicious intracranial vascular hyperdensity. Mild for age Calcified atherosclerosis at the skull base. Skull: No acute osseous abnormality identified. Sinuses/Orbits: Mild right maxillary alveolar recess mucosal thickening appears to be in conjunction with carious or extracted right maxillary anterior molar. Other visualized paranasal sinuses and mastoids are clear. Other: No acute orbit or scalp soft tissue finding. IMPRESSION: 1. No acute intracranial abnormality. Mild for age white matter changes most commonly due to chronic small vessel disease. 2. Mild odontogenic right maxillary sinus disease. Electronically Signed   By: Odessa Fleming M.D.   On: 07/31/2022 09:55   CT Angio Chest PE W/Cm &/Or Wo Cm  Result Date: 07/30/2022 CLINICAL DATA:  High probability for PE.  Shortness of breath. EXAM: CT ANGIOGRAPHY CHEST WITH CONTRAST TECHNIQUE: Multidetector CT imaging of the chest was performed using the standard protocol during bolus administration of intravenous contrast. Multiplanar CT image reconstructions and MIPs were obtained to evaluate the vascular anatomy. RADIATION DOSE REDUCTION: This exam was performed according to the departmental dose-optimization  program  which includes automated exposure control, adjustment of the mA and/or kV according to patient size and/or use of iterative reconstruction technique. CONTRAST:  75mL OMNIPAQUE IOHEXOL 350 MG/ML SOLN COMPARISON:  CT abdomen and pelvis 06/23/2022. FINDINGS: Cardiovascular: There is adequate opacification of the pulmonary arteries. There are segmental right lower lobe and right middle lobe pulmonary emboli. There also segmental left lower lobe pulmonary emboli. Heart is normal in size. There is no pericardial effusion. Aorta is normal in size. There are atherosclerotic calcifications of the aorta. Mediastinum/Nodes: No enlarged mediastinal, hilar, or axillary lymph nodes. Thyroid gland, trachea, and esophagus demonstrate no significant findings. Lungs/Pleura: There are minimal scattered ground-glass and peripheral interstitial opacities throughout both lungs. There is no pleural effusion or pneumothorax. Trachea and central airways are patent. Upper Abdomen: No acute abnormality. Left renal cysts partially imaged. Cholecystectomy clips are present. Musculoskeletal: Vertebroplasty changes are seen at T8. Review of the MIP images confirms the above findings. IMPRESSION: 1. Bilateral segmental pulmonary emboli involving the right middle lobe, right lower lobe and left lower lobe. Positive for acute PE with CTevidence of right heart strain (RV/LV Ratio = 1.1) consistent with at least submassive (intermediate risk) PE. The presence of right heart strain has been associated with an increased risk of morbidity and mortality. 2. Minimal scattered ground-glass and peripheral interstitial opacities throughout both lungs, likely infectious/inflammatory. Aortic Atherosclerosis (ICD10-I70.0). These results were called by telephone at the time of interpretation on 07/30/2022 at 11:16 pm to provider [facility] , who verbally acknowledged these results. Electronically Signed   By: Darliss Cheney M.D.   On: 07/30/2022 23:16    DG Chest Port 1 View  Result Date: 07/30/2022 CLINICAL DATA:  Shortness of breath.  Fever EXAM: PORTABLE CHEST 1 VIEW COMPARISON:  June 07, 2022 FINDINGS: No pneumothorax. No pneumothorax. The cardiomediastinal silhouette is stable. Bilateral increased lung markings are largely stable. Mildly more focal opacity in the left mid lung. The retrocardiac region on the left is not well assessed due to lack of lateral imaging. No other abnormalities. IMPRESSION: 1. Focal opacity in left mid lung could represent developing infiltrate/pneumonia given history. Recommend short-term follow-up imaging to ensure resolution. 2. Increase interstitial markings bilaterally are stable. This could represent a chronic process given stability, recurrent pulmonary edema, or an atypical infectious process. Electronically Signed   By: Gerome Sam III M.D.   On: 07/30/2022 21:36    Cardiac Studies   An echocardiogram was done today and was personally reviewed by me.  It showed normal LV systolic function with mildly dilated right ventricle with normal systolic function and mild pulmonary hypertension.  Patient Profile     82 y.o. female  with a hx of HTN, obesity, depression, anxiety, CKD stage 3, breast cancer s/p radiation and chemo 2016, gout, who presented with shortness of breath on June first and was found to have bilateral pulmonary embolism tachycardia.  Assessment & Plan    1.  Bilateral segmental pulmonary embolism with evidence of right heart strain on CT scan.  RV systolic function is normal by echo.  Mildly elevated troponin is likely due to RV strain.  Continue unfractionated heparin drip.  Vascular surgery is planning pulmonary angiography and thrombectomy tomorrow.  2.  Atrial fibrillation with RVR: She was in sinus tachycardia with PACs and likely multifocal atrial tachycardia on presentation.  Her current rhythm strip on telemetry seems to be consistent with atrial fibrillation with RVR and she is  more tachycardic.  She is currently on diltiazem drip.  She will require long-term anticoagulation.  She was on metoprolol succinate 100 mg once daily as an outpatient.  I elected to start metoprolol tartrate 25 mg every 6 hours for better rate control.       For questions or updates, please contact [facility] Please consult [email or web address] for contact info under        Signed, Lorine Bears, MD  08/01/2022, 3:51 PM

## 2022-08-01 NOTE — Progress Notes (Signed)
ANTICOAGULATION CONSULT NOTE  Pharmacy Consult for heparin infusion Indication: pulmonary embolus  No Known Allergies  Patient Measurements: Height: 5' 2.5" (158.8 cm) Weight: 126 kg (277 lb 12.5 oz) IBW/kg (Calculated) : 51.25 Heparin Dosing Weight: 79 kg  Vital Signs: BP: 125/82 (06/03 0615) Pulse Rate: 111 (06/03 0615)  Labs: Recent Labs    07/30/22 2053 07/31/22 0105 07/31/22 0319 07/31/22 0815 07/31/22 1108 07/31/22 1830 07/31/22 2315 08/01/22 0508  HGB 13.3  --   --   --  11.8*  --   --  12.0  HCT 39.7  --   --   --  35.8*  --   --  36.3  PLT 148*  --   --   --  136*  --   --  136*  APTT 31  --   --   --   --   --   --   --   LABPROT 14.7  --   --   --   --   --   --   --   INR 1.1  --   --   --   --   --   --   --   HEPARINUNFRC  --   --   --  0.17*  --  <0.10*  --  0.51  CREATININE 1.60*  --   --   --  1.44*  --  1.35* 1.28*  TROPONINIHS  --  24* 25*  --   --   --   --   --      Estimated Creatinine Clearance: 44.2 mL/min (A) (by C-G formula based on SCr of 1.28 mg/dL (H)).   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 extensive intermediate grade DCIS.    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   DDD (degenerative disc disease), lumbar    Dyspnea    GERD (gastroesophageal reflux disease)    Heart murmur    History of blood transfusion    Hyperlipidemia    Hypertension    Personal history of chemotherapy 2016   right breast ca   Personal history of radiation therapy 2016   mammosite   SOB (shortness of breath) 12/03/2014    Assessment: Pt is a 82 yo female presenting to ED for SOB, found with " bilateral segmental pulmonary emboli involving the right middle  lobe, right lower lobe and left lower lobe."  6/2 0848  HL=0.17 Subtherapeutic, inc to 1550 u/hr 6/2 1830  HL<0.10      Subtherapeutic, inc to 1800 units/hr 6/3 0508  HL 0.51 Therapeutic, continue 1800 units/hr  Goal of Therapy:  Heparin level 0.3-0.7 units/ml Monitor platelets by anticoagulation protocol: Yes   Plan:  Continue heparin infusion 1800 units/hr Will recheck HL in 8 hr  CBC daily while on heparin   Elliot Gurney, PharmD, BCPS Clinical Pharmacist  08/01/2022 7:33 AM

## 2022-08-01 NOTE — Progress Notes (Signed)
Initial Nutrition Assessment  DOCUMENTATION CODES:   Morbid obesity  INTERVENTION:   Ensure Enlive po TID, each supplement provides 350 kcal and 20 grams of protein.  MVI po daily   Pt at high refeed risk; recommend monitor potassium, magnesium and phosphorus labs daily until stable  Daily weights   NUTRITION DIAGNOSIS:   Inadequate oral intake related to acute illness as evidenced by per patient/family report.  GOAL:   Patient will meet greater than or equal to 90% of their needs  MONITOR:   PO intake, Supplement acceptance, Labs, Weight trends, I & O's, Skin  REASON FOR ASSESSMENT:   Malnutrition Screening Tool    ASSESSMENT:   83 y/o female with h/o breast cancer s/p lumpectomy/chemo/radiation, GERD, CKD III, HLD, depression, anxiety and recent hospital admission for septic colitis in April 2024 who is admitted with HCAP and PE.  Met with pt in room today. Pt reports decreased appetite and oral intake for several weeks pta. Pt reports that she just does not have much of an appetite; pt reports that she will cook a meal and then not eat it. Pt reports that she has been trying to take a few bites of her meals in hospital. Pt reports that she drinks Ensure at home. RD will add supplements and MVI to help pt meet her estimated needs. Pt is at refeed risk. Pt is unsure if she has had any recent weight changes. Per chart, pt appears weight stable at baseline.   Medications reviewed and include: azithromycin, cefepime, heparin, LRS @75ml /hr, KCl  Labs reviewed: K 3.1(L), creat 1.28(H) Mg 2.1 wnl- 6/2 Wbc- 14.8(H)  NUTRITION - FOCUSED PHYSICAL EXAM:  Flowsheet Row Most Recent Value  Orbital Region No depletion  Upper Arm Region No depletion  Thoracic and Lumbar Region No depletion  Buccal Region No depletion  Temple Region No depletion  Clavicle Bone Region No depletion  Clavicle and Acromion Bone Region No depletion  Scapular Bone Region No depletion  Dorsal Hand  No depletion  Patellar Region No depletion  Anterior Thigh Region No depletion  Posterior Calf Region No depletion  Edema (RD Assessment) None  Hair Reviewed  Eyes Reviewed  Mouth Reviewed  Skin Reviewed  Nails Reviewed   Diet Order:   Diet Order             Diet NPO time specified  Diet effective midnight           Diet regular Fluid consistency: Thin  Diet effective now                  EDUCATION NEEDS:   Education needs have been addressed  Skin:  Skin Assessment: Reviewed RN Assessment (ecchymosis)  Last BM:  pta  Height:   Ht Readings from Last 1 Encounters:  07/30/22 5' 2.5" (1.588 m)    Weight:   Wt Readings from Last 1 Encounters:  07/31/22 126 kg    Ideal Body Weight:  52.2 kg  BMI:  Body mass index is 50 kg/m.  Estimated Nutritional Needs:   Kcal:  2000-2300kcal/day  Protein:  100-115g/day  Fluid:  1.6-1.8L/day  Betsey Holiday MS, RD, LDN Please refer to [facility] for RD and/or RD on-call/weekend/after hours pager

## 2022-08-01 NOTE — Progress Notes (Signed)
Progress Note    08/01/2022 12:46 PM * No surgery date entered *  Subjective:  Maria Gutierrez. Mcafee is an 82 yo female with a  history of breast cancer, HTN, CKD III, recent hospital admission for septic colitis in April 2024. States she has been short of breath for well over a month with limited ability to ambulate secondary to this. States she gets fatigued with minimal activity. States her shortness of breath worsened after a fall 2-3 days ago when she tripped and hit her head and right side. Denies LOC. Denies chest pain- notes some discomfort on right side where she fell. Denies thrombotic phenomenon in the past. Denies leg pain or swelling.   On exam this morning she is resting in bed in ICU. She requires nasal cannula oxygen and is slightly working to breathe. She is tachycardic in the 120-130's. She endorses shortness of breath but denies any chest pain, dizzyness, nausea or vomiting. She denies ever having clots either in her legs or in her lungs.    Vitals:   08/01/22 1159 08/01/22 1200  BP:  (!) 165/143  Pulse:  98  Resp:  (!) 28  Temp: 98.5 F (36.9 C)   SpO2:  98%   Physical Exam: Cardiac:  RRR with tachycardia in 120's-130's Normal S1, S2 Lungs:  Good air movement, respirations slightly labored, equal bilaterally. No rhonchi, rales or wheezing noted.  Incisions:  None Extremities:  Bilateral lower extremities without edema. Warm to touch but unable to palpate pulses.  Abdomen:  Positive bowel sounds throughout, morbid obesity, soft, non tender and non distended.  Neurologic: AAOX3 follows commands and answers all questions appropriately.   CBC    Component Value Date/Time   WBC 14.8 (H) 08/01/2022 0508   RBC 3.80 (L) 08/01/2022 0508   HGB 12.0 08/01/2022 0508   HGB 13.6 06/05/2014 0843   HCT 36.3 08/01/2022 0508   HCT 41.4 06/05/2014 0843   PLT 136 (L) 08/01/2022 0508   PLT 180 06/05/2014 0843   MCV 95.5 08/01/2022 0508   MCV 94 06/05/2014 0843   MCH 31.6 08/01/2022  0508   MCHC 33.1 08/01/2022 0508   RDW 12.5 08/01/2022 0508   RDW 13.3 06/05/2014 0843   LYMPHSABS 2.8 06/10/2022 0344   LYMPHSABS 1.8 06/05/2014 0843   MONOABS 0.9 06/10/2022 0344   MONOABS 0.8 06/05/2014 0843   EOSABS 0.4 06/10/2022 0344   EOSABS 0.1 06/05/2014 0843   BASOSABS 0.0 06/10/2022 0344   BASOSABS 0.0 06/05/2014 0843    BMET    Component Value Date/Time   NA 138 08/01/2022 0508   NA 141 06/05/2014 0843   K 3.1 (L) 08/01/2022 0508   K 3.5 06/05/2014 0843   CL 104 08/01/2022 0508   CL 105 06/05/2014 0843   CO2 27 08/01/2022 0508   CO2 27 06/05/2014 0843   GLUCOSE 119 (H) 08/01/2022 0508   GLUCOSE 94 06/05/2014 0843   BUN 14 08/01/2022 0508   BUN 22 (H) 06/05/2014 0843   CREATININE 1.28 (H) 08/01/2022 0508   CREATININE 1.15 (H) 02/05/2021 1018   CALCIUM 7.9 (L) 08/01/2022 0508   CALCIUM 9.2 06/05/2014 0843   GFRNONAA 42 (L) 08/01/2022 0508   GFRNONAA 58 (L) 06/05/2014 0843   GFRAA 44 (L) 08/05/2019 1315   GFRAA >60 06/05/2014 0843    INR    Component Value Date/Time   INR 1.1 07/30/2022 2053     Intake/Output Summary (Last 24 hours) at 08/01/2022 1246 Last data filed at  08/01/2022 1000 Gross per 24 hour  Intake 2819.59 ml  Output 750 ml  Net 2069.59 ml     Assessment/Plan:  82 y.o. female is diagnosed with Pulmonay Embolism with right heart strain.  * No surgery date entered *   PLAN: Vascular surgery plans on taking the patient to the vascular lab for a pulmonary thrombectomy and possible IVC filter placement. I discussed in detail with the patient, her husband and her family member at the bedside this morning the procedure, benefits, risks, and complications.  They all verbalized her understanding.  I answered all their questions this morning.  Patient wishes to proceed with the procedure as soon as possible.  Patient will be made n.p.o. after midnight.  Patient remains tachycardic.  Patient remains on a heparin infusion.  Patient's latest BUN is 14,  creatinine 1.28.  Also ordered today bilateral lower extremity ultrasounds to rule out DVT. IF DVT Positive we may place IVC filter.     DVT prophylaxis: Heparin infusion   Marcie Bal [facility] 08/01/2022 12:46 PM

## 2022-08-01 NOTE — Progress Notes (Signed)
ANTICOAGULATION CONSULT NOTE  Pharmacy Consult for heparin infusion Indication: pulmonary embolus  No Known Allergies  Patient Measurements: Height: 5' 2.5" (158.8 cm) Weight: 126 kg (277 lb 12.5 oz) IBW/kg (Calculated) : 51.25 Heparin Dosing Weight: 79 kg  Vital Signs: Temp: 98.5 F (36.9 C) (06/03 1159) Temp Source: Oral (06/03 1159) BP: 150/75 (06/03 1330) Pulse Rate: 34 (06/03 1330)  Labs: Recent Labs    07/30/22 2053 07/31/22 0105 07/31/22 0319 07/31/22 0815 07/31/22 1108 07/31/22 1830 07/31/22 2315 08/01/22 0508 08/01/22 1300  HGB 13.3  --   --   --  11.8*  --   --  12.0  --   HCT 39.7  --   --   --  35.8*  --   --  36.3  --   PLT 148*  --   --   --  136*  --   --  136*  --   APTT 31  --   --   --   --   --   --   --   --   LABPROT 14.7  --   --   --   --   --   --   --   --   INR 1.1  --   --   --   --   --   --   --   --   HEPARINUNFRC  --   --   --    < >  --  <0.10*  --  0.51 0.51  CREATININE 1.60*  --   --   --  1.44*  --  1.35* 1.28*  --   TROPONINIHS  --  24* 25*  --   --   --   --   --   --    < > = values in this interval not displayed.     Estimated Creatinine Clearance: 44.2 mL/min (A) (by C-G formula based on SCr of 1.28 mg/dL (H)).   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 extensive intermediate grade DCIS.    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   DDD (degenerative disc disease), lumbar    Dyspnea    GERD (gastroesophageal reflux disease)    Heart murmur    History of blood transfusion    Hyperlipidemia    Hypertension    Personal history of chemotherapy 2016   right breast ca   Personal history of radiation therapy 2016   mammosite   SOB  (shortness of breath) 12/03/2014    Assessment: Pt is a 82 yo female presenting to ED for SOB, found with " bilateral segmental pulmonary emboli involving the right middle lobe, right lower lobe and left lower lobe."  6/2 0848  HL=0.17 Subtherapeutic, inc to 1550 u/hr 6/2 1830  HL<0.10      Subtherapeutic, inc to 1800 units/hr 6/3 0508  HL 0.51 Therapeutic, continue 1800 units/hr 6/3 1300  HL 0.51 Therapeutic x 2, continue 1800 units/hr  Goal of Therapy:  Heparin level 0.3-0.7 units/ml Monitor platelets by anticoagulation protocol: Yes   Plan: heparin level therapeutic x 2 Continue heparin infusion 1800 units/hr Will transition to daily heparin  levels. Next HL tomorrow AM. CBC daily while on heparin   Elliot Gurney, PharmD, BCPS Clinical Pharmacist  08/01/2022 1:36 PM

## 2022-08-01 NOTE — H&P (View-Only) (Signed)
Progress Note    08/01/2022 12:46 PM * No surgery date entered *  Subjective:  Maria Gutierrez is an 82 yo female with a  history of breast cancer, HTN, CKD III, recent hospital admission for septic colitis in April 2024. States she has been short of breath for well over a month with limited ability to ambulate secondary to this. States she gets fatigued with minimal activity. States her shortness of breath worsened after a fall 2-3 days ago when she tripped and hit her head and right side. Denies LOC. Denies chest pain- notes some discomfort on right side where she fell. Denies thrombotic phenomenon in the past. Denies leg pain or swelling.   On exam this morning she is resting in bed in ICU. She requires nasal cannula oxygen and is slightly working to breathe. She is tachycardic in the 120-130's. She endorses shortness of breath but denies any chest pain, dizzyness, nausea or vomiting. She denies ever having clots either in her legs or in her lungs.    Vitals:   08/01/22 1159 08/01/22 1200  BP:  (!) 165/143  Pulse:  98  Resp:  (!) 28  Temp: 98.5 F (36.9 C)   SpO2:  98%   Physical Exam: Cardiac:  RRR with tachycardia in 120's-130's Normal S1, S2 Lungs:  Good air movement, respirations slightly labored, equal bilaterally. No rhonchi, rales or wheezing noted.  Incisions:  None Extremities:  Bilateral lower extremities without edema. Warm to touch but unable to palpate pulses.  Abdomen:  Positive bowel sounds throughout, morbid obesity, soft, non tender and non distended.  Neurologic: AAOX3 follows commands and answers all questions appropriately.   CBC    Component Value Date/Time   WBC 14.8 (H) 08/01/2022 0508   RBC 3.80 (L) 08/01/2022 0508   HGB 12.0 08/01/2022 0508   HGB 13.6 06/05/2014 0843   HCT 36.3 08/01/2022 0508   HCT 41.4 06/05/2014 0843   PLT 136 (L) 08/01/2022 0508   PLT 180 06/05/2014 0843   MCV 95.5 08/01/2022 0508   MCV 94 06/05/2014 0843   MCH 31.6 08/01/2022  0508   MCHC 33.1 08/01/2022 0508   RDW 12.5 08/01/2022 0508   RDW 13.3 06/05/2014 0843   LYMPHSABS 2.8 06/10/2022 0344   LYMPHSABS 1.8 06/05/2014 0843   MONOABS 0.9 06/10/2022 0344   MONOABS 0.8 06/05/2014 0843   EOSABS 0.4 06/10/2022 0344   EOSABS 0.1 06/05/2014 0843   BASOSABS 0.0 06/10/2022 0344   BASOSABS 0.0 06/05/2014 0843    BMET    Component Value Date/Time   NA 138 08/01/2022 0508   NA 141 06/05/2014 0843   K 3.1 (L) 08/01/2022 0508   K 3.5 06/05/2014 0843   CL 104 08/01/2022 0508   CL 105 06/05/2014 0843   CO2 27 08/01/2022 0508   CO2 27 06/05/2014 0843   GLUCOSE 119 (H) 08/01/2022 0508   GLUCOSE 94 06/05/2014 0843   BUN 14 08/01/2022 0508   BUN 22 (H) 06/05/2014 0843   CREATININE 1.28 (H) 08/01/2022 0508   CREATININE 1.15 (H) 02/05/2021 1018   CALCIUM 7.9 (L) 08/01/2022 0508   CALCIUM 9.2 06/05/2014 0843   GFRNONAA 42 (L) 08/01/2022 0508   GFRNONAA 58 (L) 06/05/2014 0843   GFRAA 44 (L) 08/05/2019 1315   GFRAA >60 06/05/2014 0843    INR    Component Value Date/Time   INR 1.1 07/30/2022 2053     Intake/Output Summary (Last 24 hours) at 08/01/2022 1246 Last data filed at   08/01/2022 1000 Gross per 24 hour  Intake 2819.59 ml  Output 750 ml  Net 2069.59 ml     Assessment/Plan:  82 y.o. female is diagnosed with Pulmonay Embolism with right heart strain.  * No surgery date entered *   PLAN: Vascular surgery plans on taking the patient to the vascular lab for a pulmonary thrombectomy and possible IVC filter placement. I discussed in detail with the patient, her husband and her family member at the bedside this morning the procedure, benefits, risks, and complications.  They all verbalized her understanding.  I answered all their questions this morning.  Patient wishes to proceed with the procedure as soon as possible.  Patient will be made n.p.o. after midnight.  Patient remains tachycardic.  Patient remains on a heparin infusion.  Patient's latest BUN is 14,  creatinine 1.28.  Also ordered today bilateral lower extremity ultrasounds to rule out DVT. IF DVT Positive we may place IVC filter.     DVT prophylaxis: Heparin infusion   Jolissa Kapral R Payeton Germani [facility] 08/01/2022 12:46 PM   

## 2022-08-01 NOTE — Progress Notes (Signed)
Dr. Mayford Knife aware of temp 100.1 no new orders, give prn tylenol

## 2022-08-01 NOTE — Progress Notes (Signed)
*  PRELIMINARY RESULTS* Echocardiogram 2D Echocardiogram has been performed.  Carolyne Fiscal 08/01/2022, 2:14 PM

## 2022-08-02 ENCOUNTER — Encounter: Payer: Self-pay | Admitting: Certified Registered"

## 2022-08-02 ENCOUNTER — Encounter
Admission: EM | Disposition: A | Discharge status: Home / Self Care | Payer: Self-pay | Source: Home / Self Care | Attending: Internal Medicine

## 2022-08-02 DIAGNOSIS — I50810 Right heart failure, unspecified: Secondary | ICD-10-CM

## 2022-08-02 DIAGNOSIS — I4891 Unspecified atrial fibrillation: Secondary | ICD-10-CM | POA: Diagnosis not present

## 2022-08-02 DIAGNOSIS — I2699 Other pulmonary embolism without acute cor pulmonale: Secondary | ICD-10-CM | POA: Diagnosis not present

## 2022-08-02 HISTORY — PX: PULMONARY THROMBECTOMY: CATH118295

## 2022-08-02 LAB — CBC
HCT: 36.5 % (ref 36.0–46.0)
Hemoglobin: 12.1 g/dL (ref 12.0–15.0)
MCH: 31.9 pg (ref 26.0–34.0)
MCHC: 33.2 g/dL (ref 30.0–36.0)
MCV: 96.3 fL (ref 80.0–100.0)
Platelets: 167 10*3/uL (ref 150–400)
RBC: 3.79 MIL/uL — ABNORMAL LOW (ref 3.87–5.11)
RDW: 12.8 % (ref 11.5–15.5)
WBC: 13 10*3/uL — ABNORMAL HIGH (ref 4.0–10.5)
nRBC: 0 % (ref 0.0–0.2)

## 2022-08-02 LAB — HEMOGLOBIN A1C
Hgb A1c MFr Bld: 6 % — ABNORMAL HIGH (ref 4.8–5.6)
Mean Plasma Glucose: 126 mg/dL

## 2022-08-02 LAB — HEPARIN LEVEL (UNFRACTIONATED): Heparin Unfractionated: 0.65 IU/mL (ref 0.30–0.70)

## 2022-08-02 LAB — BASIC METABOLIC PANEL
Anion gap: 9 (ref 5–15)
BUN: 14 mg/dL (ref 8–23)
CO2: 24 mmol/L (ref 22–32)
Calcium: 8.6 mg/dL — ABNORMAL LOW (ref 8.9–10.3)
Chloride: 106 mmol/L (ref 98–111)
Creatinine, Ser: 1.19 mg/dL — ABNORMAL HIGH (ref 0.44–1.00)
GFR, Estimated: 46 mL/min — ABNORMAL LOW (ref >60)
Glucose, Bld: 104 mg/dL — ABNORMAL HIGH (ref 70–99)
Potassium: 3.9 mmol/L (ref 3.5–5.1)
Sodium: 139 mmol/L (ref 135–145)

## 2022-08-02 LAB — MAGNESIUM: Magnesium: 1.8 mg/dL (ref 1.7–2.4)

## 2022-08-02 LAB — CULTURE, BLOOD (ROUTINE X 2)

## 2022-08-02 LAB — PHOSPHORUS: Phosphorus: 3.5 mg/dL (ref 2.5–4.6)

## 2022-08-02 SURGERY — PULMONARY THROMBECTOMY
Anesthesia: Moderate Sedation | Laterality: Bilateral

## 2022-08-02 MED ORDER — PHENOL 1.4 % MT LIQD
1.0000 | OROMUCOSAL | Status: DC | PRN
  Filled 2022-08-02: qty 177

## 2022-08-02 MED ORDER — MIDAZOLAM HCL 2 MG/2ML IJ SOLN
INTRAMUSCULAR | Status: AC
  Filled 2022-08-02: qty 2

## 2022-08-02 MED ORDER — ALTEPLASE 2 MG IJ SOLR
INTRAMUSCULAR | Status: AC
  Filled 2022-08-02: qty 10

## 2022-08-02 MED ORDER — LEVALBUTEROL HCL 0.63 MG/3ML IN NEBU
0.6300 mg | INHALATION_SOLUTION | Freq: Three times a day (TID) | RESPIRATORY_TRACT | Status: DC
  Administered 2022-08-03 – 2022-08-04 (×5): 0.63 mg via RESPIRATORY_TRACT
  Filled 2022-08-02 (×8): qty 3

## 2022-08-02 MED ORDER — IODIXANOL 320 MG/ML IV SOLN
INTRAVENOUS | Status: DC | PRN
  Administered 2022-08-02: 65 mL via INTRAVENOUS

## 2022-08-02 MED ORDER — MIDAZOLAM HCL 2 MG/ML PO SYRP
8.0000 mg | ORAL_SOLUTION | Freq: Once | ORAL | Status: DC | PRN
  Filled 2022-08-02: qty 5

## 2022-08-02 MED ORDER — CEFAZOLIN SODIUM-DEXTROSE 1-4 GM/50ML-% IV SOLN
INTRAVENOUS | Status: DC | PRN
  Administered 2022-08-02: 2 g via INTRAVENOUS

## 2022-08-02 MED ORDER — ONDANSETRON HCL 4 MG/2ML IJ SOLN: 4.0000 mg | Freq: Four times a day (QID) | INTRAMUSCULAR | Status: DC | PRN

## 2022-08-02 MED ORDER — HEPARIN SODIUM (PORCINE) 1000 UNIT/ML IJ SOLN
INTRAMUSCULAR | Status: DC | PRN
  Administered 2022-08-02: 4000 [IU] via INTRAVENOUS

## 2022-08-02 MED ORDER — HEPARIN (PORCINE) 25000 UT/250ML-% IV SOLN
1800.0000 [IU]/h | INTRAVENOUS | Status: DC
  Administered 2022-08-03: 1800 [IU]/h via INTRAVENOUS
  Filled 2022-08-02: qty 250

## 2022-08-02 MED ORDER — FENTANYL CITRATE PF 50 MCG/ML IJ SOSY
PREFILLED_SYRINGE | INTRAMUSCULAR | Status: AC
  Filled 2022-08-02: qty 1

## 2022-08-02 MED ORDER — HYDROMORPHONE HCL 1 MG/ML IJ SOLN: 1.0000 mg | Freq: Once | INTRAMUSCULAR | Status: DC | PRN

## 2022-08-02 MED ORDER — CEFAZOLIN SODIUM-DEXTROSE 2-4 GM/100ML-% IV SOLN
INTRAVENOUS | Status: AC
  Filled 2022-08-02: qty 100

## 2022-08-02 MED ORDER — FAMOTIDINE 20 MG PO TABS: 40.0000 mg | ORAL_TABLET | Freq: Once | ORAL | Status: DC | PRN

## 2022-08-02 MED ORDER — SODIUM CHLORIDE 0.9 % IV SOLN: INTRAVENOUS | Status: DC

## 2022-08-02 MED ORDER — METHYLPREDNISOLONE SODIUM SUCC 125 MG IJ SOLR: 125.0000 mg | Freq: Once | INTRAMUSCULAR | Status: DC | PRN

## 2022-08-02 MED ORDER — ALTEPLASE 2 MG IJ SOLR
INTRAMUSCULAR | Status: DC | PRN
  Administered 2022-08-02: 6 mg
  Administered 2022-08-02: 4 mg

## 2022-08-02 MED ORDER — FENTANYL CITRATE PF 50 MCG/ML IJ SOSY: 12.5000 ug | PREFILLED_SYRINGE | Freq: Once | INTRAMUSCULAR | Status: DC | PRN

## 2022-08-02 MED ORDER — GUAIFENESIN-DM 100-10 MG/5ML PO SYRP
5.0000 mL | ORAL_SOLUTION | ORAL | Status: DC | PRN
  Filled 2022-08-02 (×2): qty 5

## 2022-08-02 MED ORDER — HEPARIN SODIUM (PORCINE) 1000 UNIT/ML IJ SOLN
INTRAMUSCULAR | Status: AC
  Filled 2022-08-02: qty 10

## 2022-08-02 MED ORDER — DIPHENHYDRAMINE HCL 50 MG/ML IJ SOLN: 50.0000 mg | Freq: Once | INTRAMUSCULAR | Status: DC | PRN

## 2022-08-02 MED ORDER — CEFAZOLIN SODIUM-DEXTROSE 2-4 GM/100ML-% IV SOLN: 2.0000 g | INTRAVENOUS | Status: DC

## 2022-08-02 MED ORDER — FENTANYL CITRATE (PF) 100 MCG/2ML IJ SOLN
INTRAMUSCULAR | Status: DC | PRN
  Administered 2022-08-02: 25 ug via INTRAVENOUS

## 2022-08-02 MED ORDER — MIDAZOLAM HCL 2 MG/2ML IJ SOLN
INTRAMUSCULAR | Status: DC | PRN
  Administered 2022-08-02: 2 mg via INTRAVENOUS

## 2022-08-02 SURGICAL SUPPLY — 17 items
CANISTER PENUMBRA ENGINE (MISCELLANEOUS) IMPLANT
CATH INDIGO SEP 8 (CATHETERS) IMPLANT
CATH LIGHTNING 8 XTORQ 115 (CATHETERS) IMPLANT
CLOSURE PERCLOSE PROSTYLE (VASCULAR PRODUCTS) IMPLANT
COVER PROBE ULTRASOUND 5X96 (MISCELLANEOUS) IMPLANT
GLIDEWIRE ANGLED SS 035X260CM (WIRE) IMPLANT
NDL ENTRY 21GA 7CM ECHOTIP (NEEDLE) IMPLANT
NEEDLE ENTRY 21GA 7CM ECHOTIP (NEEDLE) ×1 IMPLANT
PACK ANGIOGRAPHY (CUSTOM PROCEDURE TRAY) ×1 IMPLANT
SET INTRO CAPELLA COAXIAL (SET/KITS/TRAYS/PACK) IMPLANT
SHEATH 9FRX11 (SHEATH) IMPLANT
SHEATH BRITE TIP 6FRX11 (SHEATH) IMPLANT
SUT MNCRL AB 4-0 PS2 18 (SUTURE) IMPLANT
SUT SILK 0 FSL (SUTURE) IMPLANT
TUBING CONTRAST HIGH PRESS 72 (TUBING) IMPLANT
WIRE AMPLATZ SSTIFF .035X260CM (WIRE) IMPLANT
WIRE GUIDERIGHT .035X150 (WIRE) IMPLANT

## 2022-08-02 NOTE — Progress Notes (Signed)
ANTICOAGULATION CONSULT NOTE  Pharmacy Consult for heparin infusion Indication: pulmonary embolus  No Known Allergies  Patient Measurements: Height: 5' 2.5" (158.8 cm) Weight: 116.2 kg (256 lb 2.8 oz) IBW/kg (Calculated) : 51.25 Heparin Dosing Weight: 79 kg  Vital Signs: Temp: 98.1 F (36.7 C) (06/04 0300) Temp Source: Oral (06/04 0300) BP: 114/68 (06/04 0400) Pulse Rate: 150 (06/04 0400)  Labs: Recent Labs    07/30/22 2053 07/31/22 0105 07/31/22 0319 07/31/22 0815 07/31/22 1108 07/31/22 1830 07/31/22 2315 08/01/22 0508 08/01/22 1300 08/02/22 0647  HGB 13.3  --   --   --  11.8*  --   --  12.0  --  12.1  HCT 39.7  --   --   --  35.8*  --   --  36.3  --  36.5  PLT 148*  --   --   --  136*  --   --  136*  --  167  APTT 31  --   --   --   --   --   --   --   --   --   LABPROT 14.7  --   --   --   --   --   --   --   --   --   INR 1.1  --   --   --   --   --   --   --   --   --   HEPARINUNFRC  --   --   --    < >  --    < >  --  0.51 0.51 0.65  CREATININE 1.60*  --   --   --  1.44*  --  1.35* 1.28*  --  1.19*  TROPONINIHS  --  24* 25*  --   --   --   --   --   --   --    < > = values in this interval not displayed.     Estimated Creatinine Clearance: 45.2 mL/min (A) (by C-G formula based on SCr of 1.19 mg/dL (H)).   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 extensive intermediate grade DCIS.    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   DDD (degenerative disc disease), lumbar    Dyspnea    GERD (gastroesophageal reflux disease)    Heart murmur    History of blood transfusion    Hyperlipidemia    Hypertension    Personal history of chemotherapy 2016   right breast ca    Personal history of radiation therapy 2016   mammosite   SOB (shortness of breath) 12/03/2014    Assessment: Pt is a 82 yo female presenting to ED for SOB, found with " bilateral segmental pulmonary emboli involving the right middle lobe, right lower lobe and left lower lobe."  6/2 0848  HL=0.17 Subtherapeutic, inc to 1550 u/hr 6/2 1830  HL<0.10      Subtherapeutic, inc to 1800 units/hr 6/3 0508  HL 0.51 Therapeutic, continue 1800 units/hr 6/3 1300  HL 0.51 Therapeutic x 2, continue 1800 units/hr 6/3 0647  HL 0.65 Therapeutic. Continue 1800 units/hr  Goal  of Therapy:  Heparin level 0.3-0.7 units/ml Monitor platelets by anticoagulation protocol: Yes   Plan: heparin level therapeutic  Continue heparin infusion 1800 units/hr Continue daily heparin levels. Next HL tomorrow AM. CBC daily while on heparin   Elliot Gurney, PharmD, BCPS Clinical Pharmacist  08/02/2022 7:41 AM

## 2022-08-02 NOTE — Progress Notes (Signed)
PROGRESS NOTE   HPI was taken from Dr. Para March: Maria Gutierrez is a 82 y.o. female with medical history significant for hypertension, class III obesity, BMI 40-49.9, depression with anxiety, CKD-3a, breast cancer (s/p of radiation and chemotherapy 2016), gout, recently hospitalized from 06/07/2022 to 06/10/2022 with severe sepsis secondary to acute colitis, who presents to theED with shortness of breath, cough congestion, fever and chills. ED course and data review: Tmax 100, pulse of the 150, respirations up to the mid 30s with O2 sat low 90s for which she was placed on O2 at 2 L.   Labs with WBC 19,000 and lactic acid of 1.6.  Potassium 3, sodium 133, creatinine 1.6 up from baseline of 1.13 BNP 158. EKG, personally viewed and interpreted showing sinus arrhythmia at 123 CTA chest significant for bilateral segmental pulmonary emboli with CT evidence of right heart strain consistent with at least submassive PE as well as pneumonia IMPRESSION: 1. Bilateral segmental pulmonary emboli involving the right middle lobe, right lower lobe and left lower lobe. Positive for acute PE with CTevidence of right heart strain (RV/LV Ratio = 1.1) consistent with at least submassive (intermediate risk) PE. The presence of right heart strain has been associated with an increased risk of morbidity and mortality. 2. Minimal scattered ground-glass and peripheral interstitial opacities throughout both lungs, likely infectious/inflammatory.   Patient started on a heparin drip Rocephin and ceftriaxone She was started on a diltiazem infusion for possible new onset A-fib but had a drop in blood pressure to 71/47 which improved after dialing back infusion. At the time of request for admission heart rate 130 and BP 112/75. EKG showing fast sinus arrhythmia versus A-fib. Hospitalist consulted for admission.    Maria Gutierrez  WUJ:[id number] DOB: [birth year] DOA: 07/30/2022 PCP: Jerl Mina, MD   Assessment & Plan:    Principal Problem:   Acute massive pulmonary embolism (HCC) Active Problems:   HCAP (healthcare-associated pneumonia)   Severe sepsis (HCC)   Acute respiratory failure with hypoxia (HCC)   Tachyarrhythmia   Hypotension   Acute renal failure superimposed on stage 3a chronic kidney disease (HCC)   Obesity, Class III, BMI 40-49.9 (morbid obesity) (HCC)   Carcinoma of overlapping sites of right breast in female, estrogen receptor positive (HCC)   Pulmonary embolism, bilateral (HCC)   Acute respiratory distress   Atrial fibrillation with rapid ventricular response (HCC)  Assessment and Plan:  Acute massive pulmonary embolism: w/ right heart strain. Echo shows EF 60-6%, diastolic function could not be evaluated, right ventricular systolic function is normal & mildly elevated pulmonary artery systolic pressure. Continue on IV heparin drip. Going thrombectomy today as per vasc surg   Acute hypoxic respiratory failure: likely secondary to PE & pneumonia. Continue on IV heparin & IV abxs. Continue on supplemental oxygen and wean as tolerated.    HCAP: continue on IV cefepime, azithromycin. D/c IV vanco. Continue on bronchodilators & encourage   Sepsis: met criteria w/ tachycardia, tachypnea & pneumonia. Continue on IV abxs. Blood cxs NGTD. Severe sepsis r/o    A.fib: w/ RVR. New onset. Continue on metoprolol & IV heparin as per cardio. IV dilt drip was d/c   Hypotension: resolved    AKI on CKDIIIa: baseline Cr 1.13. Cr is trending down daily   Hypokalemia: WNL today   Hypomagnesemia: WNL today    Morbid obesity: BMI 46.1. Complicates overall care & prognosis     Hx of breast cancer: s/p chemo, radiation in 2016    Thrombocytopenia:  resolved      DVT prophylaxis: IV heparin  Code Status: full  Family Communication: discussed pt's care w/ pt's family at bedside and answered their questions  Disposition Plan:  depends on PT/OT recs (not consulted yet)   Level of care:  Stepdown  Status is: Inpatient Remains inpatient appropriate because: severity of illness, going for thrombectomy today     Consultants:  Cardio  Vasc surg  Procedures:  Antimicrobials: cefepime, azithromycin    Subjective: Pt c/o shortness of breath   Objective: Vitals:   08/02/22 0400 08/02/22 0500 08/02/22 0700 08/02/22 0800  BP: 114/68  (!) 111/95 103/72  Pulse: (!) 150   100  Resp: (!) 22  (!) 26 (!) 21  Temp:    98.4 F (36.9 C)  TempSrc:    Oral  SpO2: 94%   97%  Weight:  116.2 kg    Height:        Intake/Output Summary (Last 24 hours) at 08/02/2022 0851 Last data filed at 08/02/2022 0800 Gross per 24 hour  Intake 3695.63 ml  Output 1250 ml  Net 2445.63 ml   Filed Weights   07/30/22 2054 07/31/22 1813 08/02/22 0500  Weight: 114 kg 126 kg 116.2 kg    Examination:  General exam: appears uncomfortable. Morbidly obese   Respiratory system: course breath sounds b/l. Scattered wheezes b/l  Cardiovascular system: S1/S2+. No rubs or clicks Gastrointestinal system: Abd is soft, NT, obese & hypoactive bowel sounds  Central nervous system: Alert and oriented.moves all extremities  Psychiatry: judgement and insight appears normal. Appropriate mood and affect    Data Reviewed: I have personally reviewed following labs and imaging studies  CBC: Recent Labs  Lab 07/30/22 2053 07/31/22 1108 08/01/22 0508 08/02/22 0647  WBC 19.0* 18.1* 14.8* 13.0*  HGB 13.3 11.8* 12.0 12.1  HCT 39.7 35.8* 36.3 36.5  MCV 95.2 97.0 95.5 96.3  PLT 148* 136* 136* 167   Basic Metabolic Panel: Recent Labs  Lab 07/30/22 2053 07/31/22 0815 07/31/22 1108 07/31/22 2315 08/01/22 0508 08/02/22 0647  NA 133*  --  136 135 138 139  K 3.0*  --  3.1* 3.1* 3.1* 3.9  CL 100  --  102 101 104 106  CO2 22  --  27 26 27 24   GLUCOSE 157*  --  131* 130* 119* 104*  BUN 20  --  17 15 14 14   CREATININE 1.60*  --  1.44* 1.35* 1.28* 1.19*  CALCIUM 8.7*  --  7.8* 8.0* 7.9* 8.6*  MG  --   1.2*  --  2.1  --  1.8  PHOS  --   --   --   --   --  3.5   GFR: Estimated Creatinine Clearance: 45.2 mL/min (A) (by C-G formula based on SCr of 1.19 mg/dL (H)). Liver Function Tests: Recent Labs  Lab 07/30/22 2053  AST 34  ALT 15  ALKPHOS 54  BILITOT 1.5*  PROT 6.9  ALBUMIN 3.4*   No results for input(s): "LIPASE", "AMYLASE" in the last 168 hours. No results for input(s): "AMMONIA" in the last 168 hours. Coagulation Profile: Recent Labs  Lab 07/30/22 2053  INR 1.1   Cardiac Enzymes: No results for input(s): "CKTOTAL", "CKMB", "CKMBINDEX", "TROPONINI" in the last 168 hours. BNP (last 3 results) No results for input(s): "PROBNP" in the last 8760 hours. HbA1C: Recent Labs    07/31/22 1108  HGBA1C 6.0*   CBG: Recent Labs  Lab 07/31/22 1814  GLUCAP 134*  Lipid Profile: No results for input(s): "CHOL", "HDL", "LDLCALC", "TRIG", "CHOLHDL", "LDLDIRECT" in the last 72 hours. Thyroid Function Tests: Recent Labs    07/31/22 0815  TSH 5.070*   Anemia Panel: No results for input(s): "VITAMINB12", "FOLATE", "FERRITIN", "TIBC", "IRON", "RETICCTPCT" in the last 72 hours. Sepsis Labs: Recent Labs  Lab 07/30/22 2220 07/31/22 0815  PROCALCITON  --  0.53  LATICACIDVEN 1.6  --     Recent Results (from the past 240 hour(s))  Culture, blood (routine x 2)     Status: None (Preliminary result)   Collection Time: 07/30/22 10:20 PM   Specimen: BLOOD  Result Value Ref Range Status   Specimen Description BLOOD BLOOD RIGHT ARM  Final   Special Requests   Final    BOTTLES DRAWN AEROBIC AND ANAEROBIC Blood Culture results may not be optimal due to an inadequate volume of blood received in culture bottles   Culture   Final    NO GROWTH 3 DAYS Performed at [facility], [address], [place], [place] [address]    Report Status PENDING  Incomplete  Culture, blood (routine x 2)     Status: None (Preliminary result)   Collection Time: 07/30/22 10:21 PM   Specimen:  BLOOD  Result Value Ref Range Status   Specimen Description BLOOD BLOOD LEFT ARM  Final   Special Requests   Final    BOTTLES DRAWN AEROBIC AND ANAEROBIC Blood Culture results may not be optimal due to an inadequate volume of blood received in culture bottles   Culture   Final    NO GROWTH 3 DAYS Performed at [facility], [address] [place], [place] [address]    Report Status PENDING  Incomplete  MRSA Next Gen by PCR, Nasal     Status: None   Collection Time: 07/31/22 11:08 AM   Specimen: Nasal Mucosa; Nasal Swab  Result Value Ref Range Status   MRSA by PCR Next Gen NOT DETECTED NOT DETECTED Final    Comment: (NOTE) The GeneXpert MRSA Assay (FDA approved for NASAL specimens only), is one component of a comprehensive MRSA colonization surveillance program. It is not intended to diagnose MRSA infection nor to guide or monitor treatment for MRSA infections. Test performance is not FDA approved in patients less than 54 years old. Performed at [facility], [address] [place], [place] [address]          Radiology Studies: US Venous Img Lower Bilateral (DVT)  Result Date: 08/01/2022 CLINICAL DATA:  Pain and swelling EXAM: Bilateral lower Extremity Venous Doppler Ultrasound TECHNIQUE: Gray-scale sonography with compression, as well as color and duplex ultrasound, were performed to evaluate the deep venous system(s) from the level of the common femoral vein through the popliteal and proximal calf veins. COMPARISON:  None available FINDINGS: VENOUS Normal compressibility of the common femoral, superficial femoral, and popliteal veins, as well as the visualized calf veins. Visualized portions of profunda femoral vein and great saphenous vein unremarkable. No filling defects to suggest DVT on grayscale or color Doppler imaging. Doppler waveforms show normal direction of venous flow, normal respiratory plasticity and response to augmentation. OTHER None.  Limitations: Peroneal veins not well visualized bilaterally. IMPRESSION: No  lower extremity DVT. Electronically Signed   By: Acquanetta Belling M.D.   On: 08/01/2022 16:42   ECHOCARDIOGRAM COMPLETE  Result Date: 08/01/2022    ECHOCARDIOGRAM REPORT   Patient Name:   Maria Gutierrez Date of Exam: 08/01/2022 Medical Rec #:  [id number]  Height:       62.5 in Accession #:    [id number]    Weight:       277.8 lb Date of Birth:  [birth year]     BSA:          2.211 m Patient Age:    82 years      BP:           111/77 mmHg Patient Gender: F             HR:           109 bpm. Exam Location:  [facility] Procedure: 2D Echo, Cardiac Doppler and Color Doppler Indications:     CHF  History:         Patient has no prior history of Echocardiogram examinations.                  CHF, Arrythmias:Tachycardia, Signs/Symptoms:Shortness of                  Breath; Risk Factors:Hypertension and Dyslipidemia. CKD, Breast                  CA, Pulmonary embolus.  Sonographer:     Mikki Harbor Referring Phys:  [id number] Andris Baumann Diagnosing Phys: Yvonne Kendall MD  Sonographer Comments: Patient is obese. IMPRESSIONS  1. Left ventricular ejection fraction, by estimation, is 60 to 65%. The left ventricle has normal function. Left ventricular endocardial border not optimally defined to evaluate regional wall motion. mild to moderate left ventricular hypertrophy. Left ventricular diastolic function could not be evaluated.  2. Right ventricular systolic function is normal. The right ventricular size is mildly enlarged. There is mildly elevated pulmonary artery systolic pressure.  3. The mitral valve is grossly normal. Trivial mitral valve regurgitation. No evidence of mitral stenosis.  4. The aortic valve is tricuspid. Aortic valve regurgitation is not visualized. No aortic stenosis is present.  5. There is borderline dilatation of the ascending aorta, measuring 36 mm. FINDINGS  Left Ventricle: Left ventricular ejection fraction, by estimation, is 60  to 65%. The left ventricle has normal function. Left ventricular endocardial border not optimally defined to evaluate regional wall motion. The left ventricular internal cavity size was normal in size. Mild to moderate left ventricular hypertrophy. Left ventricular diastolic function could not be evaluated due to atrial fibrillation. Left ventricular diastolic function could not be evaluated. Right Ventricle: The right ventricular size is mildly enlarged. No increase in right ventricular wall thickness. Right ventricular systolic function is normal. There is mildly elevated pulmonary artery systolic pressure. The tricuspid regurgitant velocity is 2.90 m/s, and with an assumed right atrial pressure of 8 mmHg, the estimated right ventricular systolic pressure is 41.6 mmHg. Left Atrium: Left atrial size was normal in size. Right Atrium: Right atrial size was normal in size. Pericardium: There is no evidence of pericardial effusion. Mitral Valve: The mitral valve is grossly normal. Mild to moderate mitral annular calcification. Trivial mitral valve regurgitation. No evidence of mitral valve stenosis. MV peak gradient, 5.8 mmHg. The mean mitral valve gradient is 2.0 mmHg. Tricuspid Valve: The tricuspid valve is normal in structure. Tricuspid valve regurgitation is mild. Aortic Valve: The aortic valve is tricuspid. Aortic valve regurgitation is not visualized. No aortic stenosis is present. Aortic valve mean gradient measures 3.5 mmHg. Aortic valve peak gradient measures 6.7 mmHg. Aortic valve area, by VTI measures 2.27 cm. Pulmonic Valve: The pulmonic valve was grossly normal. Pulmonic valve regurgitation is not visualized.  No evidence of pulmonic stenosis. Aorta: The aortic root is normal in size and structure. There is borderline dilatation of the ascending aorta, measuring 36 mm. There is protruding plaque involving the ascending aorta. Pulmonary Artery: The pulmonary artery is of normal size. Venous: The inferior  vena cava was not well visualized. IAS/Shunts: The interatrial septum was not well visualized.  LEFT VENTRICLE PLAX 2D LVIDd:         4.20 cm LVIDs:         2.80 cm LV PW:         1.30 cm LV IVS:        1.30 cm LVOT diam:     2.00 cm LV SV:         48 LV SV Index:   22 LVOT Area:     3.14 cm  RIGHT VENTRICLE RV Basal diam:  3.85 cm RV Mid diam:    3.90 cm RV S prime:     27.80 cm/s LEFT ATRIUM           Index        RIGHT ATRIUM           Index LA diam:      3.20 cm 1.45 cm/m   RA Area:     15.80 cm LA Vol (A2C): 30.9 ml 13.97 ml/m  RA Volume:   49.00 ml  22.16 ml/m LA Vol (A4C): 32.1 ml 14.52 ml/m  AORTIC VALVE                    PULMONIC VALVE AV Area (Vmax):    2.29 cm     PV Vmax:       1.20 m/s AV Area (Vmean):   2.16 cm     PV Peak grad:  5.7 mmHg AV Area (VTI):     2.27 cm AV Vmax:           129.00 cm/s AV Vmean:          85.850 cm/s AV VTI:            0.211 m AV Peak Grad:      6.7 mmHg AV Mean Grad:      3.5 mmHg LVOT Vmax:         94.20 cm/s LVOT Vmean:        59.100 cm/s LVOT VTI:          0.152 m LVOT/AV VTI ratio: 0.72  AORTA Ao Root diam: 3.40 cm Ao Asc diam:  3.60 cm MITRAL VALVE                TRICUSPID VALVE MV Area (PHT): 3.80 cm     TR Peak grad:   33.6 mmHg MV Area VTI:   2.06 cm     TR Vmax:        290.00 cm/s MV Peak grad:  5.8 mmHg MV Mean grad:  2.0 mmHg     SHUNTS MV Vmax:       1.20 m/s     Systemic VTI:  0.15 m MV Vmean:      54.5 cm/s    Systemic Diam: 2.00 cm MV Decel Time: 200 msec MV E velocity: 105.55 cm/s Yvonne Kendall MD Electronically signed by Yvonne Kendall MD Signature Date/Time: 08/01/2022/2:54:36 PM    Final    DG Chest Port 1 View  Result Date: 08/01/2022 CLINICAL DATA:  Short of breath, breast cancer EXAM: PORTABLE CHEST 1 VIEW COMPARISON:  07/30/2022 FINDINGS:  Single frontal view of the chest demonstrates continued enlargement of the cardiac silhouette. Stable ectasia of the thoracic aorta, accentuated by apical lordotic positioning. Stable areas of  parenchymal scarring and subpleural fibrosis. No focal consolidation, effusion, or pneumothorax. No acute bony abnormalities. IMPRESSION: 1. Stable enlarged cardiac silhouette and thoracic aortic ectasia. 2. Bilateral parenchymal lung scarring and fibrosis. No acute airspace disease. Electronically Signed   By: Sharlet Salina M.D.   On: 08/01/2022 01:48   CT HEAD WO CONTRAST ( )  Result Date: 07/31/2022 CLINICAL DATA:  82 year old female with altered mental status. Bilateral pulmonary emboli. EXAM: CT HEAD WITHOUT CONTRAST TECHNIQUE: Contiguous axial images were obtained from the base of the skull through the vertex without intravenous contrast. RADIATION DOSE REDUCTION: This exam was performed according to the departmental dose-optimization program which includes automated exposure control, adjustment of the mA and/or kV according to patient size and/or use of iterative reconstruction technique. COMPARISON:  Previous head CT 06/21/2013. FINDINGS: Brain: No midline shift, ventriculomegaly, mass effect, evidence of mass lesion, intracranial hemorrhage or evidence of cortically based acute infarction. Patchy mild for age white matter hypodensity has increased since 2015. Otherwise normal gray-white differentiation. Mild basal ganglia vascular calcifications. Vascular: No suspicious intracranial vascular hyperdensity. Mild for age Calcified atherosclerosis at the skull base. Skull: No acute osseous abnormality identified. Sinuses/Orbits: Mild right maxillary alveolar recess mucosal thickening appears to be in conjunction with carious or extracted right maxillary anterior molar. Other visualized paranasal sinuses and mastoids are clear. Other: No acute orbit or scalp soft tissue finding. IMPRESSION: 1. No acute intracranial abnormality. Mild for age white matter changes most commonly due to chronic small vessel disease. 2. Mild odontogenic right maxillary sinus disease. Electronically Signed   By: Odessa Fleming M.D.    On: 07/31/2022 09:55        Scheduled Meds:  Chlorhexidine Gluconate Cloth  6 each Topical Q0600   feeding supplement  237 mL Oral TID BM   levalbuterol  0.63 mg Nebulization Q8H   metoprolol tartrate  25 mg Oral Q6H   multivitamin with minerals  1 tablet Oral Daily   tamoxifen  20 mg Oral Daily   venlafaxine XR  75 mg Oral Q breakfast   Continuous Infusions:  sodium chloride     azithromycin Stopped (08/02/22 0112)    ceFAZolin (ANCEF) IV     ceFEPime (MAXIPIME) IV Stopped (08/01/22 2207)   diltiazem (CARDIZEM) infusion Stopped (08/02/22 0338)   heparin 1,800 Units/hr (08/02/22 0800)   lactated ringers 75 mL/hr at 08/02/22 0800     LOS: 3 days    Time spent: 35 mins     Charise Killian, MD [facility] Pager [phone]  If 7PM-7AM, please contact night-coverage [email or web address] 08/02/2022, 8:51 AM

## 2022-08-02 NOTE — Op Note (Signed)
[facility]  Percutaneous Study/Intervention Procedural Note   Date of Surgery: 08/02/2022,4:18 PM  Surgeon:Abrish Erny, Latina Craver   Pre-operative Diagnosis: Symptomatic pulmonary emboli with right heart strain and hypoxia  Post-operative diagnosis:  Same  Procedure(s) Performed:  1.  Contrast injection right heart and bilateral pulmonary arteries  2.  Thrombolysis bilateral pulmonary arteries with 10 mg of TPA  3.  Mechanical thrombectomy bilateral lobar pulmonary arteries for removal of pulmonary emboli using the Penumbra CAT 8 thrombectomy catheter.  4.  Selective catheter placement right upper lobe pulmonary artery, middle lobe pulmonary artery and lower lobe pulmonary artery  5.  Selective catheter placement left upper lobe pulmonary artery and lower lobe pulmonary artery    Anesthesia: Conscious sedation was administered under my direct supervision by the interventional radiology RN. IV Versed plus fentanyl were utilized. Continuous ECG, pulse oximetry and blood pressure was monitored throughout the entire procedure.  Versed and fentanyl were administered intravenously.  Conscious sedation was administered for a total of 54 minutes.  Sheath: 9 French 11 cm Pinnacle sheath right common femoral vein antegrade  Contrast: 65 cc   Fluoroscopy Time: 16.1 minutes  Indications:  Patient presents with pulmonary emboli. The patient is symptomatic with hypoxemia and dyspnea on exertion.  There is evidence of right heart strain on the CT angiogram. The patient is otherwise a good candidate for intervention and even the long-term benefits pulmonary angiography with thrombolysis is offered. The risks and benefits are reviewed long-term benefits are discussed. All questions are answered patient agrees to proceed.  Procedure:  Maria Palmertree Butleris a 82 y.o. female who was identified and appropriate procedural time out was performed.  The patient was then placed supine on the table  and prepped and draped in the usual sterile fashion.  Ultrasound was used to evaluate the right common femoral vein.  It was patent, as it was echolucent and compressible.  A digital ultrasound image was acquired for the permanent record.  A micropuncture needle was used to access the right common femoral vein under direct ultrasound guidance.  A microwire was then advanced under fluoroscopic guidance followed by micro-sheath.  A 0.035 J wire was advanced without resistance and a 5Fr sheath was placed.  Perclose devices were then used in a preclose fashion and then upsized to an 9 [place] sheath.    The wire and pigtail catheter were then negotiated into the right atrium and bolus injection of contrast was utilized to demonstrate the right ventricle and the pulmonary artery outflow.   4000 units of heparin was then given and allowed to circulate.  TPA was reconstituted and delivered onto the table. A total of 10 milligrams of TPA was utilized.  4 was administered on the left side and 6 was administered on the right side. This was then allowed to dwell for 20-30 minutes.  The J-wire and pigtail catheter was advanced up to the right atrium where a bolus injection contrast was used to demonstrate the pulmonary artery outflow.  Stiff angled Glidewire was then exchanged for the J-wire and the pigtail catheter was used to select the pulmonary outflow track.  The left main pulmonary artery was evaluated first.  The pigtail catheter was then advanced into the left main pulmonary artery.  Then with the catheter in the left main pulmonary artery bolus injection contrast was utilized to demonstrate the thrombus as well as the segmental pulmonary artery vasculature. This demonstrated thrombus in the distal left main pulmonary artery extending into the  left upper lobe artery as well as the left lower lobe artery and noting that it was near occlusive.  The pigtail catheter was then advanced out so that it was positioned  within the thrombus and 4 milligrams of TPA were infused directly into the clot.  After an appropriate dwell time the Amplatz wire was reintroduced through the pigtail catheter and the pigtail catheter removed.  The Penumbra Cat 8 extra torque catheter was then advanced into the thrombus in the left lower lobe pulmonary artery.  Hand-injection contrast was used to verify the positioning and evaluate the distal anatomy.  Mechanical aspiration was performed using the CAT 8 catheter and a separator.  After multiple passes the catheter was then repositioned into the left upper lobe pulmonary artery and again hand-injection contrast was performed to verify positioning and evaluate the distal anatomy.  Multiple passes were made using mechanical aspiration in association with a separator.  Once there was free flow of blood from both lobar arteries the catheter was repositioned to the left main pulmonary artery.  Hand-injection contrast was then performed to give an assessment of the left pulmonary vasculature and the effectiveness of thrombectomy.  Satisfied with the thrombectomy on the left, I then used the penumbra CAT 8 device as well as a Glidewire and an angled catheter to select the right main pulmonary artery  A select catheter was then advanced over the Amplatz wire into the distal right main pulmonary artery and hand-injection confirmed the thrombus.  6 mg of tPA was then injected directly into the thrombus within the right distal main pulmonary artery.  After an appropriate dwell time the Amplatz wire was reintroduced through the select catheter and the select catheter removed.  The Penumbra Cat 8 extra torque catheter was then advanced into the thrombus in the right lower lobe pulmonary artery.  Hand-injection contrast was used to verify positioning and evaluate the distal anatomy.  Mechanical aspiration was performed using the CAT 8 catheter and a separator.  After multiple passes the catheter was then  repositioned into the right middle lobe pulmonary artery and again hand-injection contrast was performed to verify position and evaluate the distal anatomy.  Multiple passes were made using mechanical aspiration in association with a separator.  Once there was free flow of blood from middle and lower lobar arteries the catheter was repositioned to the right main pulmonary artery.  Hand-injection contrast was then used to give an assessment of the effectiveness of thrombectomy.  After review these images the catheter and sheath were removed and pressure held. There were no immediate complications.    Findings:   Right heart imaging:  Right atrium and right ventricle and the pulmonary outflow tract appears normal  Right lung: The initial images of the right lung demonstrate thrombus extending into the right middle and lower lobar arteries.  There is thrombus extending into the segmental branches as well.  Following thrombectomy there appears to be near total resolution of the previously identified thrombus.  Left lung:  The initial images of the left lung demonstrate thrombus extending into the left upper and lower lobar arteries.  There is thrombus extending into the segmental branches as well.  Following thrombectomy there appears to be near total resolution of the previously identified thrombus.    Disposition: Patient was taken to the recovery room in stable condition having tolerated the procedure well.  Earl Lites Laiylah Roettger 08/02/2022,4:18 PM

## 2022-08-02 NOTE — Progress Notes (Signed)
Cardiology Progress Note   Patient Name: Maria Gutierrez Date of Encounter: 08/02/2022  Primary Cardiologist: Lorine Bears, MD - new  Subjective   Breathing overall stable.  For mechanical thrombectomy today.  Remains in afib w/ rates trending in the 80's - asymptomatic.  Inpatient Medications    Scheduled Meds:  [MAR Hold] Chlorhexidine Gluconate Cloth  6 each Topical Q0600   [MAR Hold] feeding supplement  237 mL Oral TID BM   [MAR Hold] levalbuterol  0.63 mg Nebulization Q8H   [MAR Hold] metoprolol tartrate  25 mg Oral Q6H   [MAR Hold] multivitamin with minerals  1 tablet Oral Daily   [MAR Hold] tamoxifen  20 mg Oral Daily   [MAR Hold] venlafaxine XR  75 mg Oral Q breakfast   Continuous Infusions:  sodium chloride 75 mL/hr at 08/02/22 1433   [MAR Hold] azithromycin Stopped (08/02/22 0112)    ceFAZolin (ANCEF) IV      ceFAZolin (ANCEF) IV     [MAR Hold] ceFEPime (MAXIPIME) IV Stopped (08/02/22 1154)   heparin Stopped (08/02/22 1433)   lactated ringers Stopped (08/02/22 1434)   PRN Meds: [MAR Hold] acetaminophen **OR** [MAR Hold] acetaminophen, alteplase, ceFAZolin (ANCEF) IV, diphenhydrAMINE, famotidine, fentaNYL (SUBLIMAZE) injection, fentaNYL, heparin sodium (porcine), HYDROmorphone (DILAUDID) injection, iodixanol, methylPREDNISolone (SOLU-MEDROL) injection, [MAR Hold] metoprolol tartrate, midazolam, midazolam, [MAR Hold] ondansetron **OR** [MAR Hold] ondansetron (ZOFRAN) IV   Vital Signs    Vitals:   08/02/22 1349 08/02/22 1400 08/02/22 1417 08/02/22 1603  BP:  134/87 126/87 136/83  Pulse:  74  (!) 114  Resp:  (!) 24 (!) 21 20  Temp:   98 F (36.7 C)   TempSrc:      SpO2: 98% 98%  92%  Weight:      Height:        Intake/Output Summary (Last 24 hours) at 08/02/2022 1622 Last data filed at 08/02/2022 1545 Gross per 24 hour  Intake 2317.46 ml  Output 1625 ml  Net 692.46 ml   Filed Weights   07/30/22 2054 07/31/22 1813 08/02/22 0500  Weight: 114 kg 126 kg 116.2  kg    Physical Exam   GEN: obese, in no acute distress.  HEENT: Grossly normal.  Neck: Supple, obese, difficult to gauge jvp.  No bruits/masses. Cardiac: IR, IR, no murmurs, rubs, or gallops. No clubbing, cyanosis, edema.  Radials 2+, DP/PT 2+ and equal bilaterally.  Respiratory:  Respirations regular and unlabored, scattered rhonchi/wheezing. GI: Obese, soft, nontender, nondistended, BS + x 4. MS: no deformity or atrophy. Skin: warm and dry, no rash. Neuro:  Strength and sensation are intact. Psych: AAOx3.  Normal affect.  Labs    Chemistry Recent Labs  Lab 07/30/22 2053 07/31/22 1108 07/31/22 2315 08/01/22 0508 08/02/22 0647  NA 133*   < > 135 138 139  K 3.0*   < > 3.1* 3.1* 3.9  CL 100   < > 101 104 106  CO2 22   < > 26 27 24   GLUCOSE 157*   < > 130* 119* 104*  BUN 20   < > 15 14 14   CREATININE 1.60*   < > 1.35* 1.28* 1.19*  CALCIUM 8.7*   < > 8.0* 7.9* 8.6*  PROT 6.9  --   --   --   --   ALBUMIN 3.4*  --   --   --   --   AST 34  --   --   --   --   ALT  15  --   --   --   --   ALKPHOS 54  --   --   --   --   BILITOT 1.5*  --   --   --   --   GFRNONAA 32*   < > 39* 42* 46*  ANIONGAP 11   < > 8 7 9    < > = values in this interval not displayed.     Hematology Recent Labs  Lab 07/31/22 1108 08/01/22 0508 08/02/22 0647  WBC 18.1* 14.8* 13.0*  RBC 3.69* 3.80* 3.79*  HGB 11.8* 12.0 12.1  HCT 35.8* 36.3 36.5  MCV 97.0 95.5 96.3  MCH 32.0 31.6 31.9  MCHC 33.0 33.1 33.2  RDW 12.5 12.5 12.8  PLT 136* 136* 167    Cardiac Enzymes  Recent Labs  Lab 07/31/22 0105 07/31/22 0319  TROPONINIHS 24* 25*      BNP    Component Value Date/Time   BNP 158.9 (H) 07/30/2022 2053   HbA1c  Lab Results  Component Value Date   HGBA1C 6.0 (H) 07/31/2022    Radiology    PERIPHERAL VASCULAR CATHETERIZATION  Result Date: 08/02/2022 See surgical note for result.  US Venous Img Lower Bilateral (DVT)  Result Date: 08/01/2022 CLINICAL DATA:  Pain and swelling EXAM:  Bilateral lower Extremity Venous Doppler Ultrasound TECHNIQUE: Gray-scale sonography with compression, as well as color and duplex ultrasound, were performed to evaluate the deep venous system(s) from the level of the common femoral vein through the popliteal and proximal calf veins. COMPARISON:  None available FINDINGS: VENOUS Normal compressibility of the common femoral, superficial femoral, and popliteal veins, as well as the visualized calf veins. Visualized portions of profunda femoral vein and great saphenous vein unremarkable. No filling defects to suggest DVT on grayscale or color Doppler imaging. Doppler waveforms show normal direction of venous flow, normal respiratory plasticity and response to augmentation. OTHER None. Limitations: Peroneal veins not well visualized bilaterally. IMPRESSION: No  lower extremity DVT. Electronically Signed   By: Acquanetta Belling M.D.   On: 08/01/2022 16:42   DG Chest Port 1 View  Result Date: 08/01/2022 CLINICAL DATA:  Short of breath, breast cancer EXAM: PORTABLE CHEST 1 VIEW COMPARISON:  07/30/2022 FINDINGS: Single frontal view of the chest demonstrates continued enlargement of the cardiac silhouette. Stable ectasia of the thoracic aorta, accentuated by apical lordotic positioning. Stable areas of parenchymal scarring and subpleural fibrosis. No focal consolidation, effusion, or pneumothorax. No acute bony abnormalities. IMPRESSION: 1. Stable enlarged cardiac silhouette and thoracic aortic ectasia. 2. Bilateral parenchymal lung scarring and fibrosis. No acute airspace disease. Electronically Signed   By: Sharlet Salina M.D.   On: 08/01/2022 01:48   CT HEAD WO CONTRAST ( )  Result Date: 07/31/2022 CLINICAL DATA:  82 year old female with altered mental status. Bilateral pulmonary emboli. EXAM: CT HEAD WITHOUT CONTRAST TECHNIQUE: Contiguous axial images were obtained from the base of the skull through the vertex without intravenous contrast. RADIATION DOSE REDUCTION:  This exam was performed according to the departmental dose-optimization program which includes automated exposure control, adjustment of the mA and/or kV according to patient size and/or use of iterative reconstruction technique. COMPARISON:  Previous head CT 06/21/2013. FINDINGS: Brain: No midline shift, ventriculomegaly, mass effect, evidence of mass lesion, intracranial hemorrhage or evidence of cortically based acute infarction. Patchy mild for age white matter hypodensity has increased since 2015. Otherwise normal gray-white differentiation. Mild basal ganglia vascular calcifications. Vascular: No suspicious intracranial vascular hyperdensity. Mild for age Calcified  atherosclerosis at the skull base. Skull: No acute osseous abnormality identified. Sinuses/Orbits: Mild right maxillary alveolar recess mucosal thickening appears to be in conjunction with carious or extracted right maxillary anterior molar. Other visualized paranasal sinuses and mastoids are clear. Other: No acute orbit or scalp soft tissue finding. IMPRESSION: 1. No acute intracranial abnormality. Mild for age white matter changes most commonly due to chronic small vessel disease. 2. Mild odontogenic right maxillary sinus disease. Electronically Signed   By: Odessa Fleming M.D.   On: 07/31/2022 09:55   CT Angio Chest PE W/Cm &/Or Wo Cm  Result Date: 07/30/2022 CLINICAL DATA:  High probability for PE.  Shortness of breath. EXAM: CT ANGIOGRAPHY CHEST WITH CONTRAST TECHNIQUE: Multidetector CT imaging of the chest was performed using the standard protocol during bolus administration of intravenous contrast. Multiplanar CT image reconstructions and MIPs were obtained to evaluate the vascular anatomy. RADIATION DOSE REDUCTION: This exam was performed according to the departmental dose-optimization program which includes automated exposure control, adjustment of the mA and/or kV according to patient size and/or use of iterative reconstruction technique.  CONTRAST:  75mL OMNIPAQUE IOHEXOL 350 MG/ML SOLN COMPARISON:  CT abdomen and pelvis 06/23/2022. FINDINGS: Cardiovascular: There is adequate opacification of the pulmonary arteries. There are segmental right lower lobe and right middle lobe pulmonary emboli. There also segmental left lower lobe pulmonary emboli. Heart is normal in size. There is no pericardial effusion. Aorta is normal in size. There are atherosclerotic calcifications of the aorta. Mediastinum/Nodes: No enlarged mediastinal, hilar, or axillary lymph nodes. Thyroid gland, trachea, and esophagus demonstrate no significant findings. Lungs/Pleura: There are minimal scattered ground-glass and peripheral interstitial opacities throughout both lungs. There is no pleural effusion or pneumothorax. Trachea and central airways are patent. Upper Abdomen: No acute abnormality. Left renal cysts partially imaged. Cholecystectomy clips are present. Musculoskeletal: Vertebroplasty changes are seen at T8. Review of the MIP images confirms the above findings. IMPRESSION: 1. Bilateral segmental pulmonary emboli involving the right middle lobe, right lower lobe and left lower lobe. Positive for acute PE with CTevidence of right heart strain (RV/LV Ratio = 1.1) consistent with at least submassive (intermediate risk) PE. The presence of right heart strain has been associated with an increased risk of morbidity and mortality. 2. Minimal scattered ground-glass and peripheral interstitial opacities throughout both lungs, likely infectious/inflammatory. Aortic Atherosclerosis (ICD10-I70.0). These results were called by telephone at the time of interpretation on 07/30/2022 at 11:16 pm to provider [facility] , who verbally acknowledged these results. Electronically Signed   By: Darliss Cheney M.D.   On: 07/30/2022 23:16   DG Chest Port 1 View  Result Date: 07/30/2022 CLINICAL DATA:  Shortness of breath.  Fever EXAM: PORTABLE CHEST 1 VIEW COMPARISON:  June 07, 2022 FINDINGS: No  pneumothorax. No pneumothorax. The cardiomediastinal silhouette is stable. Bilateral increased lung markings are largely stable. Mildly more focal opacity in the left mid lung. The retrocardiac region on the left is not well assessed due to lack of lateral imaging. No other abnormalities. IMPRESSION: 1. Focal opacity in left mid lung could represent developing infiltrate/pneumonia given history. Recommend short-term follow-up imaging to ensure resolution. 2. Increase interstitial markings bilaterally are stable. This could represent a chronic process given stability, recurrent pulmonary edema, or an atypical infectious process. Electronically Signed   By: Gerome Sam III M.D.   On: 07/30/2022 21:36    Telemetry    AFib, 80's - Personally Reviewed  Cardiac Studies   2D Echocardiogram 6.3.2024   1. Left  ventricular ejection fraction, by estimation, is 60 to 65%. The  left ventricle has normal function. Left ventricular endocardial border  not optimally defined to evaluate regional wall motion. mild to moderate  left ventricular hypertrophy. Left  ventricular diastolic function could not be evaluated.   2. Right ventricular systolic function is normal. The right ventricular  size is mildly enlarged. There is mildly elevated pulmonary artery  systolic pressure.   3. The mitral valve is grossly normal. Trivial mitral valve  regurgitation. No evidence of mitral stenosis.   4. The aortic valve is tricuspid. Aortic valve regurgitation is not  visualized. No aortic stenosis is present.   5. There is borderline dilatation of the ascending aorta, measuring 36  mm.   Patient Profile     82 y.o. female  with a hx of HTN, obesity, depression, anxiety, CKD stage 3, breast cancer s/p radiation and chemo 2016, gout, who presented with shortness of breath on June first and was found to have bilateral pulmonary embolism tachycardia.   Assessment & Plan    1.  Bilateral segmental PE:  with evidence of  R heart strain on CT.  NL RV fxn on echo.  For mechanical thrombectomy today. On heparin w/ plan to transition to PE dosed OAC at vasc surgery discretion.  2.  Afib w/ RVR:  Presented in sinus w/ PACs and MAT but then developed Afib w/ RVR.  Rates 80's on ? blocker.  CHA2DS2VASc = 3.  Plan for [facility] as outlined above.  Currently on heparin.  3.  Elevated hsTrop: minimal trop elev to 25 in setting of #1.  Echo w/ nl LVEF.  No plan for isch eval @ this time.  4.  Primary HTN:  stable.  5.  HL:  on simva prior to admission.  6.  CKDIII:  stable.  7.  PNA:  abx per IM.  Signed, Nicolasa Ducking, NP  08/02/2022, 4:22 PM    For questions or updates, please contact   Please consult [email or web address] for contact info under Cardiology/STEMI.

## 2022-08-02 NOTE — Progress Notes (Signed)
ANTICOAGULATION CONSULT NOTE  Pharmacy Consult for heparin infusion Indication: pulmonary embolus  No Known Allergies  Patient Measurements: Height: 5' 2.5" (158.8 cm) Weight: 116.2 kg (256 lb 2.8 oz) IBW/kg (Calculated) : 51.25 Heparin Dosing Weight: 79 kg  Vital Signs: Temp: 98 F (36.7 C) (06/04 1417) Temp Source: Oral (06/04 1200) BP: 136/83 (06/04 1603) Pulse Rate: 114 (06/04 1603)  Labs: Recent Labs    07/30/22 2053 07/31/22 0105 07/31/22 0319 07/31/22 0815 07/31/22 1108 07/31/22 1830 07/31/22 2315 08/01/22 0508 08/01/22 1300 08/02/22 0647  HGB 13.3  --   --   --  11.8*  --   --  12.0  --  12.1  HCT 39.7  --   --   --  35.8*  --   --  36.3  --  36.5  PLT 148*  --   --   --  136*  --   --  136*  --  167  APTT 31  --   --   --   --   --   --   --   --   --   LABPROT 14.7  --   --   --   --   --   --   --   --   --   INR 1.1  --   --   --   --   --   --   --   --   --   HEPARINUNFRC  --   --   --    < >  --    < >  --  0.51 0.51 0.65  CREATININE 1.60*  --   --   --  1.44*  --  1.35* 1.28*  --  1.19*  TROPONINIHS  --  24* 25*  --   --   --   --   --   --   --    < > = values in this interval not displayed.     Estimated Creatinine Clearance: 45.2 mL/min (A) (by C-G formula based on SCr of 1.19 mg/dL (H)).   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 extensive intermediate grade DCIS.    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   DDD (degenerative disc disease), lumbar    Dyspnea    GERD (gastroesophageal reflux disease)    Heart murmur    History of blood transfusion    Hyperlipidemia    Hypertension    Personal history of chemotherapy 2016   right breast ca    Personal history of radiation therapy 2016   mammosite   SOB (shortness of breath) 12/03/2014    Assessment: Pt is a 82 yo female presenting to ED for SOB, found with " bilateral segmental pulmonary emboli involving the right middle lobe, right lower lobe and left lower lobe."  6/2 0848  HL=0.17 Subtherapeutic, inc to 1550 u/hr 6/2 1830  HL<0.10      Subtherapeutic, inc to 1800 units/hr 6/3 0508  HL 0.51 Therapeutic, continue 1800 units/hr 6/3 1300  HL 0.51 Therapeutic x 2, continue 1800 units/hr 6/3 0647  HL 0.65 Therapeutic. Continue 1800 units/hr  Goal  of Therapy:  Heparin level 0.3-0.7 units/ml Monitor platelets by anticoagulation protocol: Yes   Plan:  Per Dr. Gilda Crease resume heparin infusion 08/02/22 @ 1700 Resume at heparin infusion at rate of 1800 units/hr Check HL 8 hours after resumption Daily CBC while on heparin   Barrie Folk, PharmD Clinical Pharmacist  08/02/2022 4:26 PM

## 2022-08-02 NOTE — Plan of Care (Signed)

## 2022-08-02 NOTE — Interval H&P Note (Signed)
History and Physical Interval Note:  08/02/2022 2:06 PM  Maria Gutierrez  has presented today for surgery, with the diagnosis of Pulmonary embolism.  The various methods of treatment have been discussed with the patient and family. After consideration of risks, benefits and other options for treatment, the patient has consented to  Procedure(s): PULMONARY THROMBECTOMY (Bilateral) as a surgical intervention.  The patient's history has been reviewed, patient examined, no change in status, stable for surgery.  I have reviewed the patient's chart and labs.  Questions were answered to the patient's satisfaction.     Levora Dredge

## 2022-08-03 ENCOUNTER — Other Ambulatory Visit (HOSPITAL_COMMUNITY): Payer: Self-pay

## 2022-08-03 ENCOUNTER — Encounter: Payer: Self-pay | Admitting: Vascular Surgery

## 2022-08-03 DIAGNOSIS — J189 Pneumonia, unspecified organism: Secondary | ICD-10-CM | POA: Diagnosis not present

## 2022-08-03 DIAGNOSIS — I2699 Other pulmonary embolism without acute cor pulmonale: Secondary | ICD-10-CM | POA: Diagnosis not present

## 2022-08-03 DIAGNOSIS — I4891 Unspecified atrial fibrillation: Secondary | ICD-10-CM | POA: Diagnosis not present

## 2022-08-03 DIAGNOSIS — R0603 Acute respiratory distress: Secondary | ICD-10-CM | POA: Diagnosis not present

## 2022-08-03 LAB — CBC
HCT: 34.1 % — ABNORMAL LOW (ref 36.0–46.0)
Hemoglobin: 11.3 g/dL — ABNORMAL LOW (ref 12.0–15.0)
MCH: 32.4 pg (ref 26.0–34.0)
MCHC: 33.1 g/dL (ref 30.0–36.0)
MCV: 97.7 fL (ref 80.0–100.0)
Platelets: 186 10*3/uL (ref 150–400)
RBC: 3.49 MIL/uL — ABNORMAL LOW (ref 3.87–5.11)
RDW: 12.9 % (ref 11.5–15.5)
WBC: 11.1 10*3/uL — ABNORMAL HIGH (ref 4.0–10.5)
nRBC: 0 % (ref 0.0–0.2)

## 2022-08-03 LAB — BASIC METABOLIC PANEL
Anion gap: 9 (ref 5–15)
BUN: 16 mg/dL (ref 8–23)
CO2: 23 mmol/L (ref 22–32)
Calcium: 8.3 mg/dL — ABNORMAL LOW (ref 8.9–10.3)
Chloride: 108 mmol/L (ref 98–111)
Creatinine, Ser: 1.07 mg/dL — ABNORMAL HIGH (ref 0.44–1.00)
GFR, Estimated: 52 mL/min — ABNORMAL LOW (ref >60)
Glucose, Bld: 88 mg/dL (ref 70–99)
Potassium: 3.6 mmol/L (ref 3.5–5.1)
Sodium: 140 mmol/L (ref 135–145)

## 2022-08-03 LAB — HEPARIN LEVEL (UNFRACTIONATED)
Heparin Unfractionated: 0.66 IU/mL (ref 0.30–0.70)
Heparin Unfractionated: 0.73 IU/mL — ABNORMAL HIGH (ref 0.30–0.70)

## 2022-08-03 LAB — CULTURE, BLOOD (ROUTINE X 2): Culture: NO GROWTH

## 2022-08-03 LAB — MAGNESIUM: Magnesium: 1.6 mg/dL — ABNORMAL LOW (ref 1.7–2.4)

## 2022-08-03 LAB — PHOSPHORUS: Phosphorus: 3.5 mg/dL (ref 2.5–4.6)

## 2022-08-03 MED ORDER — MAGNESIUM SULFATE 2 GM/50ML IV SOLN
2.0000 g | Freq: Once | INTRAVENOUS | Status: AC
  Administered 2022-08-03: 2 g via INTRAVENOUS
  Filled 2022-08-03: qty 50

## 2022-08-03 MED ORDER — SIMVASTATIN 20 MG PO TABS
40.0000 mg | ORAL_TABLET | Freq: Every day | ORAL | Status: DC
  Administered 2022-08-03: 40 mg via ORAL
  Filled 2022-08-03: qty 2

## 2022-08-03 MED ORDER — APIXABAN 5 MG PO TABS: 5.0000 mg | ORAL_TABLET | Freq: Two times a day (BID) | ORAL | Status: DC

## 2022-08-03 MED ORDER — ORAL CARE MOUTH RINSE: 15.0000 mL | OROMUCOSAL | Status: DC | PRN

## 2022-08-03 MED ORDER — AMIODARONE HCL 200 MG PO TABS
400.0000 mg | ORAL_TABLET | Freq: Two times a day (BID) | ORAL | Status: DC
  Administered 2022-08-03 – 2022-08-05 (×5): 400 mg via ORAL
  Filled 2022-08-03 (×5): qty 2

## 2022-08-03 MED ORDER — LOPERAMIDE HCL 2 MG PO CAPS
2.0000 mg | ORAL_CAPSULE | Freq: Four times a day (QID) | ORAL | Status: DC | PRN
  Administered 2022-08-03: 2 mg via ORAL
  Filled 2022-08-03: qty 1

## 2022-08-03 MED ORDER — METOPROLOL TARTRATE 50 MG PO TABS
50.0000 mg | ORAL_TABLET | Freq: Two times a day (BID) | ORAL | Status: DC
  Administered 2022-08-03 – 2022-08-04 (×2): 50 mg via ORAL
  Filled 2022-08-03 (×2): qty 1

## 2022-08-03 MED ORDER — POTASSIUM CHLORIDE CRYS ER 10 MEQ PO TBCR
40.0000 meq | EXTENDED_RELEASE_TABLET | Freq: Once | ORAL | Status: AC
  Administered 2022-08-03: 40 meq via ORAL
  Filled 2022-08-03: qty 4

## 2022-08-03 MED ORDER — APIXABAN 5 MG PO TABS
10.0000 mg | ORAL_TABLET | Freq: Two times a day (BID) | ORAL | Status: DC
  Administered 2022-08-03 – 2022-08-05 (×5): 10 mg via ORAL
  Filled 2022-08-03 (×4): qty 2
  Filled 2022-08-03: qty 4

## 2022-08-03 MED ORDER — POTASSIUM CHLORIDE CRYS ER 20 MEQ PO TBCR: 40.0000 meq | EXTENDED_RELEASE_TABLET | Freq: Once | ORAL | Status: DC

## 2022-08-03 NOTE — Progress Notes (Signed)
ANTICOAGULATION CONSULT NOTE  Pharmacy Consult for heparin infusion Indication: pulmonary embolus  No Known Allergies  Patient Measurements: Height: 5' 2.5" (158.8 cm) Weight: 106.2 kg (234 lb 2.1 oz) IBW/kg (Calculated) : 51.25 Heparin Dosing Weight: 79 kg  Vital Signs: Temp: 98.6 F (37 C) (06/05 0200) Temp Source: Oral (06/05 0200) BP: 114/68 (06/05 0814) Pulse Rate: 78 (06/05 0814)  Labs: Recent Labs    08/01/22 0508 08/01/22 1300 08/02/22 0647 08/03/22 0100 08/03/22 0517  HGB 12.0  --  12.1  --  11.3*  HCT 36.3  --  36.5  --  34.1*  PLT 136*  --  167  --  186  HEPARINUNFRC 0.51 0.51 0.65 0.66  --   CREATININE 1.28*  --  1.19*  --  1.07*     Estimated Creatinine Clearance: 47.7 mL/min (A) (by C-G formula based on SCr of 1.07 mg/dL (H)).   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 extensive intermediate grade DCIS.    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  Goal of Therapy:  Heparin level 0.3-0.7 units/ml Monitor platelets by anticoagulation protocol: Yes   Plan: transitioning IV heparin to Eliquis Stop heparin infusion and  give apixaban 10 mg Eliquis 10 mg BID x 7 days followed by 5 mg BID thereafter   Elliot Gurney, PharmD, BCPS Clinical Pharmacist  08/03/2022 9:11 AM

## 2022-08-03 NOTE — TOC Benefit Eligibility Note (Signed)
Patient Advocate Encounter  Insurance verification completed.    The patient is currently admitted and upon discharge could be taking Eliquis Starter Pack.  The current 30 day co-pay is $47.00.   The patient is insured through [employer] Medicare Part D   This test claim was processed through [facility]- copay amounts may vary at other pharmacies due to pharmacy/plan contracts, or as the patient moves through the different stages of their insurance plan.  Ade Stmarie, CPHT Pharmacy Patient Advocate Specialist [facility] Patient Advocate Team Direct Number: [phone]  Fax: [phone]       

## 2022-08-03 NOTE — Progress Notes (Signed)
ANTICOAGULATION CONSULT NOTE  Pharmacy Consult for heparin infusion Indication: pulmonary embolus  No Known Allergies  Patient Measurements: Height: 5' 2.5" (158.8 cm) Weight: 116.2 kg (256 lb 2.8 oz) IBW/kg (Calculated) : 51.25 Heparin Dosing Weight: 79 kg  Vital Signs: Temp: 99.1 F (37.3 C) (06/04 1659) Temp Source: Oral (06/04 1659) BP: 124/65 (06/04 2200) Pulse Rate: 106 (06/04 2128)  Labs: Recent Labs    07/31/22 0319 07/31/22 0815 07/31/22 1108 07/31/22 1830 07/31/22 2315 08/01/22 0508 08/01/22 1300 08/02/22 0647 08/03/22 0100  HGB  --    < > 11.8*  --   --  12.0  --  12.1  --   HCT  --   --  35.8*  --   --  36.3  --  36.5  --   PLT  --   --  136*  --   --  136*  --  167  --   HEPARINUNFRC  --    < >  --    < >  --  0.51 0.51 0.65 0.66  CREATININE  --    < > 1.44*  --  1.35* 1.28*  --  1.19*  --   TROPONINIHS 25*  --   --   --   --   --   --   --   --    < > = values in this interval not displayed.     Estimated Creatinine Clearance: 45.2 mL/min (A) (by C-G formula based on SCr of 1.19 mg/dL (H)).   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 extensive intermediate grade DCIS.    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   DDD (degenerative disc disease), lumbar    Dyspnea    GERD (gastroesophageal reflux disease)    Heart murmur    History of blood transfusion    Hyperlipidemia    Hypertension    Personal history of chemotherapy 2016   right breast ca   Personal history of radiation therapy 2016   mammosite   SOB (shortness of breath) 12/03/2014    Assessment: Pt is a 82 yo female presenting to ED for SOB, found with " bilateral segmental pulmonary emboli  involving the right middle lobe, right lower lobe and left lower lobe."  6/2 0848  HL=0.17 Subtherapeutic, inc to 1550 u/hr 6/2 1830  HL<0.10      Subtherapeutic, inc to 1800 units/hr 6/3 0508  HL 0.51 Therapeutic, continue 1800 units/hr 6/3 1300  HL 0.51 Therapeutic x 2, continue 1800 units/hr 6/3 0647  HL 0.65 Therapeutic. Continue 1800 units/hr 6/4 0100  HL 0.66       Therapeutic X 1 following restart  Goal of Therapy:  Heparin level 0.3-0.7 units/ml Monitor platelets by anticoagulation protocol: Yes   Plan:  Per Dr. Gilda Crease resume heparin infusion 08/02/22 @ 1700 Resume at heparin infusion at rate of 1800 units/hr 6/5:  HL @ 0100 = 0.66, therapeutic X 1 - Will continue pt on current rate and draw confirmation level on 6/5 @ 0900.   Scherrie Gerlach, PharmD Clinical Pharmacist  08/03/2022 1:28 AM

## 2022-08-03 NOTE — Progress Notes (Signed)
Rounding Note    Patient Name: Maria Gutierrez Date of Encounter: 08/03/2022  [facility] Cardiologist: Lorine Bears, MD   Subjective   Resting supine in bed, family at the bedside Remains on heparin infusion, plan to transition to Eliquis Discussed events from yesterday, thrombolysis bilateral pulmonary arteries, mechanical thrombectomy Echocardiogram with mildly dilated RV, mildly elevated right heart pressures Minimally elevated troponin 25 Remains on nasal cannula oxygen Discussed arrhythmia, normal sinus rhythm on arrival converting to atrial fibrillation June 3, was already on heparin infusion at that time  Inpatient Medications    Scheduled Meds:  amiodarone  400 mg Oral BID   apixaban  10 mg Oral BID   Followed by   Melene Muller ON 08/10/2022] apixaban  5 mg Oral BID   Chlorhexidine Gluconate Cloth  6 each Topical Q0600   feeding supplement  237 mL Oral TID BM   levalbuterol  0.63 mg Nebulization TID   metoprolol tartrate  50 mg Oral BID   multivitamin with minerals  1 tablet Oral Daily   tamoxifen  20 mg Oral Daily   venlafaxine XR  75 mg Oral Q breakfast   Continuous Infusions:  azithromycin Stopped (08/03/22 0031)   ceFEPime (MAXIPIME) IV 2 g (08/03/22 1105)   magnesium sulfate bolus IVPB     PRN Meds: acetaminophen **OR** acetaminophen, guaiFENesin-dextromethorphan, loperamide, metoprolol tartrate, ondansetron **OR** ondansetron (ZOFRAN) IV, mouth rinse, phenol   Vital Signs    Vitals:   08/03/22 0600 08/03/22 0636 08/03/22 0814 08/03/22 1100  BP:  113/72 114/68 99/88  Pulse: (!) 103  78 73  Resp:   (!) 25 (!) 25  Temp:      TempSrc:      SpO2: 95%  95% 95%  Weight:      Height:        Intake/Output Summary (Last 24 hours) at 08/03/2022 1228 Last data filed at 08/03/2022 1031 Gross per 24 hour  Intake 2375.51 ml  Output 1250 ml  Net 1125.51 ml      08/03/2022    5:00 AM 08/02/2022    5:00 AM 07/31/2022    6:13 PM  Last 3 Weights  Weight  (lbs) 234 lb 2.1 oz 256 lb 2.8 oz 277 lb 12.5 oz  Weight (kg) 106.2 kg 116.2 kg 126 kg      Telemetry    Atrial fibrillation rate 110 up to 120- Personally Reviewed  ECG     - Personally Reviewed  Physical Exam   GEN: No acute distress.   Neck: No JVD Cardiac: RRR, no murmurs, rubs, or gallops.  Respiratory: Clear to auscultation bilaterally. GI: Soft, nontender, non-distended  MS: No edema; No deformity. Neuro:  Nonfocal  Psych: Normal affect   Labs    High Sensitivity Troponin:   Recent Labs  Lab 07/31/22 0105 07/31/22 0319  TROPONINIHS 24* 25*     Chemistry Recent Labs  Lab 07/30/22 2053 07/31/22 0815 07/31/22 2315 08/01/22 0508 08/02/22 0647 08/03/22 0517  NA 133*   < > 135 138 139 140  K 3.0*   < > 3.1* 3.1* 3.9 3.6  CL 100   < > 101 104 106 108  CO2 22   < > 26 27 24 23   GLUCOSE 157*   < > 130* 119* 104* 88  BUN 20   < > 15 14 14 16   CREATININE 1.60*   < > 1.35* 1.28* 1.19* 1.07*  CALCIUM 8.7*   < > 8.0* 7.9* 8.6* 8.3*  MG  --    < > 2.1  --  1.8 1.6*  PROT 6.9  --   --   --   --   --   ALBUMIN 3.4*  --   --   --   --   --   AST 34  --   --   --   --   --   ALT 15  --   --   --   --   --   ALKPHOS 54  --   --   --   --   --   BILITOT 1.5*  --   --   --   --   --   GFRNONAA 32*   < > 39* 42* 46* 52*  ANIONGAP 11   < > 8 7 9 9    < > = values in this interval not displayed.    Lipids No results for input(s): "CHOL", "TRIG", "HDL", "LABVLDL", "LDLCALC", "CHOLHDL" in the last 168 hours.  Hematology Recent Labs  Lab 08/01/22 0508 08/02/22 0647 08/03/22 0517  WBC 14.8* 13.0* 11.1*  RBC 3.80* 3.79* 3.49*  HGB 12.0 12.1 11.3*  HCT 36.3 36.5 34.1*  MCV 95.5 96.3 97.7  MCH 31.6 31.9 32.4  MCHC 33.1 33.2 33.1  RDW 12.5 12.8 12.9  PLT 136* 167 186   Thyroid  Recent Labs  Lab 07/31/22 0815  TSH 5.070*    BNP Recent Labs  Lab 07/30/22 2053  BNP 158.9*    DDimer No results for input(s): "DDIMER" in the last 168 hours.   Radiology     PERIPHERAL VASCULAR CATHETERIZATION  Result Date: 08/02/2022 See surgical note for result.  US Venous Img Lower Bilateral (DVT)  Result Date: 08/01/2022 CLINICAL DATA:  Pain and swelling EXAM: Bilateral lower Extremity Venous Doppler Ultrasound TECHNIQUE: Gray-scale sonography with compression, as well as color and duplex ultrasound, were performed to evaluate the deep venous system(s) from the level of the common femoral vein through the popliteal and proximal calf veins. COMPARISON:  None available FINDINGS: VENOUS Normal compressibility of the common femoral, superficial femoral, and popliteal veins, as well as the visualized calf veins. Visualized portions of profunda femoral vein and great saphenous vein unremarkable. No filling defects to suggest DVT on grayscale or color Doppler imaging. Doppler waveforms show normal direction of venous flow, normal respiratory plasticity and response to augmentation. OTHER None. Limitations: Peroneal veins not well visualized bilaterally. IMPRESSION: No  lower extremity DVT. Electronically Signed   By: Acquanetta Belling M.D.   On: 08/01/2022 16:42   ECHOCARDIOGRAM COMPLETE  Result Date: 08/01/2022    ECHOCARDIOGRAM REPORT   Patient Name:   Maria Gutierrez Date of Exam: 08/01/2022 Medical Rec #:  [id number]     Height:       62.5 in Accession #:    [id number]    Weight:       277.8 lb Date of Birth:  [birth year]     BSA:          2.211 m Patient Age:    82 years      BP:           111/77 mmHg Patient Gender: F             HR:           109 bpm. Exam Location:  [facility] Procedure: 2D Echo, Cardiac Doppler and Color Doppler Indications:     CHF  History:  Patient has no prior history of Echocardiogram examinations.                  CHF, Arrythmias:Tachycardia, Signs/Symptoms:Shortness of                  Breath; Risk Factors:Hypertension and Dyslipidemia. CKD, Breast                  CA, Pulmonary embolus.  Sonographer:     Mikki Harbor Referring Phys:  [id number] Andris Baumann Diagnosing Phys: Yvonne Kendall MD  Sonographer Comments: Patient is obese. IMPRESSIONS  1. Left ventricular ejection fraction, by estimation, is 60 to 65%. The left ventricle has normal function. Left ventricular endocardial border not optimally defined to evaluate regional wall motion. mild to moderate left ventricular hypertrophy. Left ventricular diastolic function could not be evaluated.  2. Right ventricular systolic function is normal. The right ventricular size is mildly enlarged. There is mildly elevated pulmonary artery systolic pressure.  3. The mitral valve is grossly normal. Trivial mitral valve regurgitation. No evidence of mitral stenosis.  4. The aortic valve is tricuspid. Aortic valve regurgitation is not visualized. No aortic stenosis is present.  5. There is borderline dilatation of the ascending aorta, measuring 36 mm. FINDINGS  Left Ventricle: Left ventricular ejection fraction, by estimation, is 60 to 65%. The left ventricle has normal function. Left ventricular endocardial border not optimally defined to evaluate regional wall motion. The left ventricular internal cavity size was normal in size. Mild to moderate left ventricular hypertrophy. Left ventricular diastolic function could not be evaluated due to atrial fibrillation. Left ventricular diastolic function could not be evaluated. Right Ventricle: The right ventricular size is mildly enlarged. No increase in right ventricular wall thickness. Right ventricular systolic function is normal. There is mildly elevated pulmonary artery systolic pressure. The tricuspid regurgitant velocity is 2.90 m/s, and with an assumed right atrial pressure of 8 mmHg, the estimated right ventricular systolic pressure is 41.6 mmHg. Left Atrium: Left atrial size was normal in size. Right Atrium: Right atrial size was normal in size. Pericardium: There is no evidence of pericardial effusion. Mitral Valve: The mitral valve is grossly normal. Mild to  moderate mitral annular calcification. Trivial mitral valve regurgitation. No evidence of mitral valve stenosis. MV peak gradient, 5.8 mmHg. The mean mitral valve gradient is 2.0 mmHg. Tricuspid Valve: The tricuspid valve is normal in structure. Tricuspid valve regurgitation is mild. Aortic Valve: The aortic valve is tricuspid. Aortic valve regurgitation is not visualized. No aortic stenosis is present. Aortic valve mean gradient measures 3.5 mmHg. Aortic valve peak gradient measures 6.7 mmHg. Aortic valve area, by VTI measures 2.27 cm. Pulmonic Valve: The pulmonic valve was grossly normal. Pulmonic valve regurgitation is not visualized. No evidence of pulmonic stenosis. Aorta: The aortic root is normal in size and structure. There is borderline dilatation of the ascending aorta, measuring 36 mm. There is protruding plaque involving the ascending aorta. Pulmonary Artery: The pulmonary artery is of normal size. Venous: The inferior vena cava was not well visualized. IAS/Shunts: The interatrial septum was not well visualized.  LEFT VENTRICLE PLAX 2D LVIDd:         4.20 cm LVIDs:         2.80 cm LV PW:         1.30 cm LV IVS:        1.30 cm LVOT diam:     2.00 cm LV SV:  48 LV SV Index:   22 LVOT Area:     3.14 cm  RIGHT VENTRICLE RV Basal diam:  3.85 cm RV Mid diam:    3.90 cm RV S prime:     27.80 cm/s LEFT ATRIUM           Index        RIGHT ATRIUM           Index LA diam:      3.20 cm 1.45 cm/m   RA Area:     15.80 cm LA Vol (A2C): 30.9 ml 13.97 ml/m  RA Volume:   49.00 ml  22.16 ml/m LA Vol (A4C): 32.1 ml 14.52 ml/m  AORTIC VALVE                    PULMONIC VALVE AV Area (Vmax):    2.29 cm     PV Vmax:       1.20 m/s AV Area (Vmean):   2.16 cm     PV Peak grad:  5.7 mmHg AV Area (VTI):     2.27 cm AV Vmax:           129.00 cm/s AV Vmean:          85.850 cm/s AV VTI:            0.211 m AV Peak Grad:      6.7 mmHg AV Mean Grad:      3.5 mmHg LVOT Vmax:         94.20 cm/s LVOT Vmean:        59.100  cm/s LVOT VTI:          0.152 m LVOT/AV VTI ratio: 0.72  AORTA Ao Root diam: 3.40 cm Ao Asc diam:  3.60 cm MITRAL VALVE                TRICUSPID VALVE MV Area (PHT): 3.80 cm     TR Peak grad:   33.6 mmHg MV Area VTI:   2.06 cm     TR Vmax:        290.00 cm/s MV Peak grad:  5.8 mmHg MV Mean grad:  2.0 mmHg     SHUNTS MV Vmax:       1.20 m/s     Systemic VTI:  0.15 m MV Vmean:      54.5 cm/s    Systemic Diam: 2.00 cm MV Decel Time: 200 msec MV E velocity: 105.55 cm/s Yvonne Kendall MD Electronically signed by Yvonne Kendall MD Signature Date/Time: 08/01/2022/2:54:36 PM    Final     Cardiac Studies   2D Echocardiogram 6.3.2024    1. Left ventricular ejection fraction, by estimation, is 60 to 65%. The  left ventricle has normal function. Left ventricular endocardial border  not optimally defined to evaluate regional wall motion. mild to moderate  left ventricular hypertrophy. Left  ventricular diastolic function could not be evaluated.   2. Right ventricular systolic function is normal. The right ventricular  size is mildly enlarged. There is mildly elevated pulmonary artery  systolic pressure.   3. The mitral valve is grossly normal. Trivial mitral valve  regurgitation. No evidence of mitral stenosis.   4. The aortic valve is tricuspid. Aortic valve regurgitation is not  visualized. No aortic stenosis is present.   5. There is borderline dilatation of the ascending aorta, measuring 36  mm.   Patient Profile     82 y.o. female with a hx of HTN,  obesity, depression, anxiety, CKD stage 3, breast cancer s/p radiation and chemo 2016, gout, who presented with shortness of breath on June first and was found to have bilateral pulmonary embolism tachycardia.   Assessment & Plan    Bilateral pulmonary emboli Mild right heart strain on CT scan that echocardiogram Mechanical thrombectomy, tPA yesterday with improvement of symptoms On heparin with plan to transition to Eliquis 10 twice daily 1 week  then down to 5 twice daily  Atrial fibrillation with RVR Presenting in sinus tachycardia/MAT, developing atrial fibrillation with RVR June 3rd Difficulty with rate controlled on metoprolol alone Metoprolol dosing 50 twice daily, limited secondary to hypotension Will start amiodarone 400 twice daily 1 week then down to 200 twice daily with plan for outpatient cardioversion once clinically stable he remains in atrial fibrillation  Chronic kidney disease stage III Stable renal function Creatinine 1.07   Total encounter time more than 50 minutes  Greater than 50% was spent in counseling and coordination of care with the patient   For questions or updates, please contact [facility] Please consult [email or web address] for contact info under        Signed, Julien Nordmann, MD  08/03/2022, 12:28 PM

## 2022-08-03 NOTE — Progress Notes (Signed)
Progress Note    Maria Gutierrez  ZOX:[id number] DOB: 1940-11-03  DOA: 07/30/2022 PCP: Jerl Mina, MD      Brief Narrative:    Medical records reviewed and are as summarized below:  Maria Gutierrez is a 82 y.o. female  with medical history significant for hypertension, class III obesity, BMI 40-49.9, depression with anxiety, CKD-3a, breast cancer (s/p of radiation and chemotherapy 2016), gout, recently hospitalized from 06/07/2022 to 06/10/2022 with severe sepsis secondary to acute colitis.  She presented to the hospital with shortness of breath, cough, congestion, fever and chills.  She was tachycardic, tachypneic in the ED.  She also had leukocytosis with WBC of 19,000.   She was found to have acute pulmonary embolism with right heart strain, acute hypoxic respiratory failure and severe sepsis secondary to pneumonia.          Assessment/Plan:   Principal Problem:   Acute massive pulmonary embolism (HCC) Active Problems:   HCAP (healthcare-associated pneumonia)   Severe sepsis (HCC)   Acute respiratory failure with hypoxia (HCC)   Tachyarrhythmia   Hypotension   Acute renal failure superimposed on stage 3a chronic kidney disease (HCC)   Obesity, Class III, BMI 40-49.9 (morbid obesity) (HCC)   Carcinoma of overlapping sites of right breast in female, estrogen receptor positive (HCC)   Pulmonary embolism, bilateral (HCC)   Acute respiratory distress   Atrial fibrillation with rapid ventricular response (HCC)   Nutrition Problem: Inadequate oral intake Etiology: acute illness  Signs/Symptoms: per patient/family report   Body mass index is 42.14 kg/m.  (Morbid obesity)    Acute pulmonary embolism with right heart strain: S/p mechanical thrombectomy to bilateral lobar pulmonary arteries on 08/02/2023.  Continue Eliquis   Severe sepsis secondary to pneumonia: Discontinue IV fluids.  Continue IV cefepime and azithromycin.  No growth on blood cultures thus  far.   Acute hypoxic respiratory failure: Improved   Atrial fibrillation with rapid ventricular response.  Continue Eliquis, metoprolol and amiodarone.   Hypomagnesemia: Replete magnesium with IV magnesium sulfate.   Hypokalemia: Improved   AKI on CKD stage IIIa: Improved   Loose stools: Imodium as needed.   Thrombocytopenia: Resolved   History of breast cancer s/p chemotherapy and radiation in 2016   Diet Order             Diet regular Fluid consistency: Thin  Diet effective now                            Consultants: Vascular surgeon Cardiologist  Procedures: Mechanical thrombectomy bilateral lobar pulmonary arteries on 08/02/2022    Medications:    amiodarone  400 mg Oral BID   apixaban  10 mg Oral BID   Followed by   Melene Muller ON 08/10/2022] apixaban  5 mg Oral BID   Chlorhexidine Gluconate Cloth  6 each Topical Q0600   feeding supplement  237 mL Oral TID BM   levalbuterol  0.63 mg Nebulization TID   metoprolol tartrate  50 mg Oral BID   multivitamin with minerals  1 tablet Oral Daily   tamoxifen  20 mg Oral Daily   venlafaxine XR  75 mg Oral Q breakfast   Continuous Infusions:  azithromycin Stopped (08/03/22 0031)   ceFEPime (MAXIPIME) IV 2 g (08/03/22 1105)   lactated ringers 75 mL/hr at 08/03/22 0800   magnesium sulfate bolus IVPB       Anti-infectives (From admission, onward)  Start     Dose/Rate Route Frequency Ordered Stop   08/02/22 1455  ceFAZolin (ANCEF) IVPB 1 g/50 mL premix  Status:  Discontinued        over 30 Minutes  Continuous PRN 08/02/22 1459 08/02/22 1624   08/02/22 1330  ceFAZolin (ANCEF) IVPB 2g/100 mL premix  Status:  Discontinued        2 g 200 mL/hr over 30 Minutes Intravenous 30 min pre-op 08/02/22 0716 08/02/22 1624   08/02/22 0600  vancomycin (VANCOREADY) IVPB 1500 mg/300 mL  Status:  Discontinued        1,500 mg 150 mL/hr over 120 Minutes Intravenous Every 48 hours 07/31/22 1240 07/31/22 1403    08/01/22 0000  azithromycin (ZITHROMAX) 500 mg in sodium chloride 0.9 % 250 mL IVPB        500 mg 250 mL/hr over 60 Minutes Intravenous Every 24 hours 07/31/22 1405     07/31/22 1000  ceFEPIme (MAXIPIME) 2 g in sodium chloride 0.9 % 100 mL IVPB        2 g 200 mL/hr over 30 Minutes Intravenous Every 12 hours 07/31/22 0331 08/07/22 0959   07/31/22 0630  vancomycin (VANCOREADY) IVPB 1250 mg/250 mL       See Hyperspace for full Linked Orders Report.   1,250 mg 166.7 mL/hr over 90 Minutes Intravenous  Once 07/31/22 0354 07/31/22 0844   07/31/22 0500  vancomycin (VANCOREADY) IVPB 1250 mg/250 mL       See Hyperspace for full Linked Orders Report.   1,250 mg 166.7 mL/hr over 90 Minutes Intravenous  Once 07/31/22 0354 07/31/22 0844   07/31/22 0354  vancomycin variable dose per unstable renal function (pharmacist dosing)  Status:  Discontinued         Does not apply See admin instructions 07/31/22 0354 07/31/22 1240   07/31/22 0330  vancomycin (VANCOCIN) IVPB 1000 mg/200 mL premix  Status:  Discontinued        1,000 mg 200 mL/hr over 60 Minutes Intravenous  Once 07/31/22 0327 07/31/22 0331   07/31/22 0330  ceFEPIme (MAXIPIME) 2 g in sodium chloride 0.9 % 100 mL IVPB  Status:  Discontinued        2 g 200 mL/hr over 30 Minutes Intravenous  Once 07/31/22 0327 07/31/22 0331   07/30/22 2145  cefTRIAXone (ROCEPHIN) 1 g in sodium chloride 0.9 % 100 mL IVPB        1 g 200 mL/hr over 30 Minutes Intravenous  Once 07/30/22 2143 07/30/22 2240   07/30/22 2145  azithromycin (ZITHROMAX) 500 mg in sodium chloride 0.9 % 250 mL IVPB        500 mg 250 mL/hr over 60 Minutes Intravenous  Once 07/30/22 2143 07/31/22 0001              Family Communication/Anticipated D/C date and plan/Code Status   DVT prophylaxis:  apixaban (ELIQUIS) tablet 10 mg  apixaban (ELIQUIS) tablet 5 mg     Code Status: Full Code  Family Communication: Plan discussed with the husband at the bedside Disposition Plan: Plan to  discharge home in 1 to 2 days   Status is: Inpatient Remains inpatient appropriate because: Acute PE, pneumonia       Subjective:   Interval events noted.  No shortness of breath or chest pain.  She feels better today.  Her husband was at the bedside.  Objective:    Vitals:   08/03/22 0600 08/03/22 0636 08/03/22 0814 08/03/22 1100  BP:  113/72 114/68 99/88  Pulse: (!) 103  78 73  Resp:   (!) 25 (!) 25  Temp:      TempSrc:      SpO2: 95%  95% 95%  Weight:      Height:       No data found.   Intake/Output Summary (Last 24 hours) at 08/03/2022 1205 Last data filed at 08/03/2022 1031 Gross per 24 hour  Intake 2375.51 ml  Output 1250 ml  Net 1125.51 ml   Filed Weights   07/31/22 1813 08/02/22 0500 08/03/22 0500  Weight: 126 kg 116.2 kg 106.2 kg    Exam:  GEN: NAD SKIN: No rash EYES: EOMI ENT: MMM CV: Irregular rate and rhythm, tachycardic PULM: Decreased air entry bilaterally, no wheezing or rales heard ABD: soft, ND, NT, +BS CNS: AAO x 3, non focal EXT: No edema or tenderness        Data Reviewed:   I have personally reviewed following labs and imaging studies:  Labs: Labs show the following:   Basic Metabolic Panel: Recent Labs  Lab 07/31/22 0815 07/31/22 1108 07/31/22 2315 08/01/22 0508 08/02/22 0647 08/03/22 0517  NA  --  136 135 138 139 140  K  --  3.1* 3.1* 3.1* 3.9 3.6  CL  --  102 101 104 106 108  CO2  --  27 26 27 24 23   GLUCOSE  --  131* 130* 119* 104* 88  BUN  --  17 15 14 14 16   CREATININE  --  1.44* 1.35* 1.28* 1.19* 1.07*  CALCIUM  --  7.8* 8.0* 7.9* 8.6* 8.3*  MG 1.2*  --  2.1  --  1.8 1.6*  PHOS  --   --   --   --  3.5 3.5   GFR Estimated Creatinine Clearance: 47.7 mL/min (A) (by C-G formula based on SCr of 1.07 mg/dL (H)). Liver Function Tests: Recent Labs  Lab 07/30/22 2053  AST 34  ALT 15  ALKPHOS 54  BILITOT 1.5*  PROT 6.9  ALBUMIN 3.4*   No results for input(s): "LIPASE", "AMYLASE" in the last 168  hours. No results for input(s): "AMMONIA" in the last 168 hours. Coagulation profile Recent Labs  Lab 07/30/22 2053  INR 1.1    CBC: Recent Labs  Lab 07/30/22 2053 07/31/22 1108 08/01/22 0508 08/02/22 0647 08/03/22 0517  WBC 19.0* 18.1* 14.8* 13.0* 11.1*  HGB 13.3 11.8* 12.0 12.1 11.3*  HCT 39.7 35.8* 36.3 36.5 34.1*  MCV 95.2 97.0 95.5 96.3 97.7  PLT 148* 136* 136* 167 186   Cardiac Enzymes: No results for input(s): "CKTOTAL", "CKMB", "CKMBINDEX", "TROPONINI" in the last 168 hours. BNP (last 3 results) No results for input(s): "PROBNP" in the last 8760 hours. CBG: Recent Labs  Lab 07/31/22 1814  GLUCAP 134*   D-Dimer: No results for input(s): "DDIMER" in the last 72 hours. Hgb A1c: No results for input(s): "HGBA1C" in the last 72 hours. Lipid Profile: No results for input(s): "CHOL", "HDL", "LDLCALC", "TRIG", "CHOLHDL", "LDLDIRECT" in the last 72 hours. Thyroid function studies: No results for input(s): "TSH", "T4TOTAL", "T3FREE", "THYROIDAB" in the last 72 hours.  Invalid input(s): "FREET3" Anemia work up: No results for input(s): "VITAMINB12", "FOLATE", "FERRITIN", "TIBC", "IRON", "RETICCTPCT" in the last 72 hours. Sepsis Labs: Recent Labs  Lab 07/30/22 2220 07/31/22 0815 07/31/22 1108 08/01/22 0508 08/02/22 0647 08/03/22 0517  PROCALCITON  --  0.53  --   --   --   --   WBC  --   --  18.1* 14.8* 13.0* 11.1*  LATICACIDVEN 1.6  --   --   --   --   --     Microbiology Recent Results (from the past 240 hour(s))  Culture, blood (routine x 2)     Status: None (Preliminary result)   Collection Time: 07/30/22 10:20 PM   Specimen: BLOOD  Result Value Ref Range Status   Specimen Description BLOOD BLOOD RIGHT ARM  Final   Special Requests   Final    BOTTLES DRAWN AEROBIC AND ANAEROBIC Blood Culture results may not be optimal due to an inadequate volume of blood received in culture bottles   Culture   Final    NO GROWTH 4 DAYS Performed at [facility], [address] [place], [place] [address]    Report Status PENDING  Incomplete  Culture, blood (routine x 2)     Status: None (Preliminary result)   Collection Time: 07/30/22 10:21 PM   Specimen: BLOOD  Result Value Ref Range Status   Specimen Description BLOOD BLOOD LEFT ARM  Final   Special Requests   Final    BOTTLES DRAWN AEROBIC AND ANAEROBIC Blood Culture results may not be optimal due to an inadequate volume of blood received in culture bottles   Culture   Final    NO GROWTH 4 DAYS Performed at [facility], [address] [place], [place] [address]    Report Status PENDING  Incomplete  MRSA Next Gen by PCR, Nasal     Status: None   Collection Time: 07/31/22 11:08 AM   Specimen: Nasal Mucosa; Nasal Swab  Result Value Ref Range Status   MRSA by PCR Next Gen NOT DETECTED NOT DETECTED Final    Comment: (NOTE) The GeneXpert MRSA Assay (FDA approved for NASAL specimens only), is one component of a comprehensive MRSA colonization surveillance program. It is not intended to diagnose MRSA infection nor to guide or monitor treatment for MRSA infections. Test performance is not FDA approved in patients less than 25 years old. Performed at [facility], [address] [place], [place] [address]     Procedures and diagnostic studies:  PERIPHERAL VASCULAR CATHETERIZATION  Result Date: 08/02/2022 See surgical note for result.  US Venous Img Lower Bilateral (DVT)  Result Date: 08/01/2022 CLINICAL DATA:  Pain and swelling EXAM: Bilateral lower Extremity Venous Doppler Ultrasound TECHNIQUE: Gray-scale sonography with compression, as well as color and duplex ultrasound, were performed to evaluate the deep venous system(s) from the level of the common femoral vein through the popliteal and proximal calf veins. COMPARISON:  None available FINDINGS: VENOUS Normal compressibility of the common femoral, superficial femoral, and popliteal veins, as well as  the visualized calf veins. Visualized portions of profunda femoral vein and great saphenous vein unremarkable. No filling defects to suggest DVT on grayscale or color Doppler imaging. Doppler waveforms show normal direction of venous flow, normal respiratory plasticity and response to augmentation. OTHER None. Limitations: Peroneal veins not well visualized bilaterally. IMPRESSION: No  lower extremity DVT. Electronically Signed   By: Acquanetta Belling M.D.   On: 08/01/2022 16:42   ECHOCARDIOGRAM COMPLETE  Result Date: 08/01/2022    ECHOCARDIOGRAM REPORT   Patient Name:   ANNAROSE BARAL Date of Exam: 08/01/2022 Medical Rec #:  [id number]     Height:       62.5 in Accession #:    [id number]    Weight:       277.8 lb Date of Birth:  21-Dec-1940  BSA:          2.211 m Patient Age:    82 years      BP:           111/77 mmHg Patient Gender: F             HR:           109 bpm. Exam Location:  [facility] Procedure: 2D Echo, Cardiac Doppler and Color Doppler Indications:     CHF  History:         Patient has no prior history of Echocardiogram examinations.                  CHF, Arrythmias:Tachycardia, Signs/Symptoms:Shortness of                  Breath; Risk Factors:Hypertension and Dyslipidemia. CKD, Breast                  CA, Pulmonary embolus.  Sonographer:     Mikki Harbor Referring Phys:  [id number] Andris Baumann Diagnosing Phys: Yvonne Kendall MD  Sonographer Comments: Patient is obese. IMPRESSIONS  1. Left ventricular ejection fraction, by estimation, is 60 to 65%. The left ventricle has normal function. Left ventricular endocardial border not optimally defined to evaluate regional wall motion. mild to moderate left ventricular hypertrophy. Left ventricular diastolic function could not be evaluated.  2. Right ventricular systolic function is normal. The right ventricular size is mildly enlarged. There is mildly elevated pulmonary artery systolic pressure.  3. The mitral valve is grossly normal. Trivial mitral valve  regurgitation. No evidence of mitral stenosis.  4. The aortic valve is tricuspid. Aortic valve regurgitation is not visualized. No aortic stenosis is present.  5. There is borderline dilatation of the ascending aorta, measuring 36 mm. FINDINGS  Left Ventricle: Left ventricular ejection fraction, by estimation, is 60 to 65%. The left ventricle has normal function. Left ventricular endocardial border not optimally defined to evaluate regional wall motion. The left ventricular internal cavity size was normal in size. Mild to moderate left ventricular hypertrophy. Left ventricular diastolic function could not be evaluated due to atrial fibrillation. Left ventricular diastolic function could not be evaluated. Right Ventricle: The right ventricular size is mildly enlarged. No increase in right ventricular wall thickness. Right ventricular systolic function is normal. There is mildly elevated pulmonary artery systolic pressure. The tricuspid regurgitant velocity is 2.90 m/s, and with an assumed right atrial pressure of 8 mmHg, the estimated right ventricular systolic pressure is 41.6 mmHg. Left Atrium: Left atrial size was normal in size. Right Atrium: Right atrial size was normal in size. Pericardium: There is no evidence of pericardial effusion. Mitral Valve: The mitral valve is grossly normal. Mild to moderate mitral annular calcification. Trivial mitral valve regurgitation. No evidence of mitral valve stenosis. MV peak gradient, 5.8 mmHg. The mean mitral valve gradient is 2.0 mmHg. Tricuspid Valve: The tricuspid valve is normal in structure. Tricuspid valve regurgitation is mild. Aortic Valve: The aortic valve is tricuspid. Aortic valve regurgitation is not visualized. No aortic stenosis is present. Aortic valve mean gradient measures 3.5 mmHg. Aortic valve peak gradient measures 6.7 mmHg. Aortic valve area, by VTI measures 2.27 cm. Pulmonic Valve: The pulmonic valve was grossly normal. Pulmonic valve regurgitation is  not visualized. No evidence of pulmonic stenosis. Aorta: The aortic root is normal in size and structure. There is borderline dilatation of the ascending aorta, measuring 36 mm. There is protruding plaque involving the ascending aorta. Pulmonary  Artery: The pulmonary artery is of normal size. Venous: The inferior vena cava was not well visualized. IAS/Shunts: The interatrial septum was not well visualized.  LEFT VENTRICLE PLAX 2D LVIDd:         4.20 cm LVIDs:         2.80 cm LV PW:         1.30 cm LV IVS:        1.30 cm LVOT diam:     2.00 cm LV SV:         48 LV SV Index:   22 LVOT Area:     3.14 cm  RIGHT VENTRICLE RV Basal diam:  3.85 cm RV Mid diam:    3.90 cm RV S prime:     27.80 cm/s LEFT ATRIUM           Index        RIGHT ATRIUM           Index LA diam:      3.20 cm 1.45 cm/m   RA Area:     15.80 cm LA Vol (A2C): 30.9 ml 13.97 ml/m  RA Volume:   49.00 ml  22.16 ml/m LA Vol (A4C): 32.1 ml 14.52 ml/m  AORTIC VALVE                    PULMONIC VALVE AV Area (Vmax):    2.29 cm     PV Vmax:       1.20 m/s AV Area (Vmean):   2.16 cm     PV Peak grad:  5.7 mmHg AV Area (VTI):     2.27 cm AV Vmax:           129.00 cm/s AV Vmean:          85.850 cm/s AV VTI:            0.211 m AV Peak Grad:      6.7 mmHg AV Mean Grad:      3.5 mmHg LVOT Vmax:         94.20 cm/s LVOT Vmean:        59.100 cm/s LVOT VTI:          0.152 m LVOT/AV VTI ratio: 0.72  AORTA Ao Root diam: 3.40 cm Ao Asc diam:  3.60 cm MITRAL VALVE                TRICUSPID VALVE MV Area (PHT): 3.80 cm     TR Peak grad:   33.6 mmHg MV Area VTI:   2.06 cm     TR Vmax:        290.00 cm/s MV Peak grad:  5.8 mmHg MV Mean grad:  2.0 mmHg     SHUNTS MV Vmax:       1.20 m/s     Systemic VTI:  0.15 m MV Vmean:      54.5 cm/s    Systemic Diam: 2.00 cm MV Decel Time: 200 msec MV E velocity: 105.55 cm/s Yvonne Kendall MD Electronically signed by Yvonne Kendall MD Signature Date/Time: 08/01/2022/2:54:36 PM    Final                LOS: 4 days    Mercedez Boule  [facility]   Pager on [email or web address]. If 7PM-7AM, please contact night-coverage at [email or web address]     08/03/2022, 12:05 PM

## 2022-08-03 NOTE — Plan of Care (Signed)

## 2022-08-03 NOTE — Progress Notes (Signed)
Progress Note    08/03/2022 10:25 AM 1 Day Post-Op  Subjective:  Maria Gutierrez is a 82 y.o. female  with medical history significant for hypertension, class III obesity, BMI 40-49.9, depression with anxiety, CKD, breast cancer (s/p of radiation and chemotherapy 2016), gout, recently hospitalized from 06/07/2022 to 06/10/2022 with severe sepsis secondary to acute colitis.  She presented to the hospital with shortness of breath, cough, congestion, fever and chills.  She was tachycardic, tachypneic in the ED.  She also had leukocytosis with WBC of 19,000.  Patient was found to have acute pulmonary embolisms with right heart strain and acute hypoxic respiratory failure due to severe sepsis secondary to pneumonia.  Vascular surgery was consulted regarding possible pulmonary thrombectomy and IVC filter placement.   Vitals:   08/03/22 0636 08/03/22 0814  BP: 113/72 114/68  Pulse:  78  Resp:  (!) 25  Temp:    SpO2:  95%   Physical Exam: Cardiac:  Irregular Rhythm HX Atrial Fibrillation, No murmurs, rubs or gallops.  Lungs:  Unlabored Breathing without any rhonchi, rales or wheezing today. Remains diminished in the bases.  Incisions:  Right groin with dressing clean dry and intact.  Extremities:  Bilateral lower extremities without edema. Warm to touch but unable to palpate pulses.  Abdomen:  Positive bowel sounds throughout, morbid obesity, soft, non tender and non distended.  Neurologic: AAOX3 follows commands and answers all questions appropriately.   CBC    Component Value Date/Time   WBC 11.1 (H) 08/03/2022 0517   RBC 3.49 (L) 08/03/2022 0517   HGB 11.3 (L) 08/03/2022 0517   HGB 13.6 06/05/2014 0843   HCT 34.1 (L) 08/03/2022 0517   HCT 41.4 06/05/2014 0843   PLT 186 08/03/2022 0517   PLT 180 06/05/2014 0843   MCV 97.7 08/03/2022 0517   MCV 94 06/05/2014 0843   MCH 32.4 08/03/2022 0517   MCHC 33.1 08/03/2022 0517   RDW 12.9 08/03/2022 0517   RDW 13.3 06/05/2014 0843   LYMPHSABS  2.8 06/10/2022 0344   LYMPHSABS 1.8 06/05/2014 0843   MONOABS 0.9 06/10/2022 0344   MONOABS 0.8 06/05/2014 0843   EOSABS 0.4 06/10/2022 0344   EOSABS 0.1 06/05/2014 0843   BASOSABS 0.0 06/10/2022 0344   BASOSABS 0.0 06/05/2014 0843    BMET    Component Value Date/Time   NA 140 08/03/2022 0517   NA 141 06/05/2014 0843   K 3.6 08/03/2022 0517   K 3.5 06/05/2014 0843   CL 108 08/03/2022 0517   CL 105 06/05/2014 0843   CO2 23 08/03/2022 0517   CO2 27 06/05/2014 0843   GLUCOSE 88 08/03/2022 0517   GLUCOSE 94 06/05/2014 0843   BUN 16 08/03/2022 0517   BUN 22 (H) 06/05/2014 0843   CREATININE 1.07 (H) 08/03/2022 0517   CREATININE 1.15 (H) 02/05/2021 1018   CALCIUM 8.3 (L) 08/03/2022 0517   CALCIUM 9.2 06/05/2014 0843   GFRNONAA 52 (L) 08/03/2022 0517   GFRNONAA 58 (L) 06/05/2014 0843   GFRAA 44 (L) 08/05/2019 1315   GFRAA >60 06/05/2014 0843    INR    Component Value Date/Time   INR 1.1 07/30/2022 2053     Intake/Output Summary (Last 24 hours) at 08/03/2022 1025 Last data filed at 08/03/2022 0800 Gross per 24 hour  Intake 2599.71 ml  Output 1250 ml  Net 1349.71 ml     Assessment/Plan:  82 y.o. female is s/p Mechanical Pulmonary Thrombectomy  1 Day Post-Op Patient states she is  breathing much better this morning. Patient remains tachycardic but not working as hard to breathe. Right groin without hematoma, seroma or infection.   PLAN: Discontinue Heparin Infusion Start Eliquis 10 mg BID X 7 days then convert to 5 mg BID  Wean Nasal Canula Oxygen as tolerated Ambulate as tolerated.  Advance diet as tolerated.  Continue to work with PT/OT  DVT prophylaxis:  Heparin Infusion to be stopped today and Started on Eliquis 10 mg BID   Marcie Bal [facility] 08/03/2022 10:25 AM

## 2022-08-04 DIAGNOSIS — I2694 Multiple subsegmental pulmonary emboli without acute cor pulmonale: Secondary | ICD-10-CM

## 2022-08-04 DIAGNOSIS — J189 Pneumonia, unspecified organism: Secondary | ICD-10-CM | POA: Diagnosis not present

## 2022-08-04 DIAGNOSIS — R0603 Acute respiratory distress: Secondary | ICD-10-CM | POA: Diagnosis not present

## 2022-08-04 DIAGNOSIS — I4891 Unspecified atrial fibrillation: Secondary | ICD-10-CM | POA: Diagnosis not present

## 2022-08-04 DIAGNOSIS — I2699 Other pulmonary embolism without acute cor pulmonale: Secondary | ICD-10-CM | POA: Diagnosis not present

## 2022-08-04 LAB — CULTURE, BLOOD (ROUTINE X 2): Culture: NO GROWTH

## 2022-08-04 LAB — BASIC METABOLIC PANEL
Anion gap: 8 (ref 5–15)
BUN: 13 mg/dL (ref 8–23)
CO2: 23 mmol/L (ref 22–32)
Calcium: 8.5 mg/dL — ABNORMAL LOW (ref 8.9–10.3)
Chloride: 109 mmol/L (ref 98–111)
Creatinine, Ser: 1.08 mg/dL — ABNORMAL HIGH (ref 0.44–1.00)
GFR, Estimated: 52 mL/min — ABNORMAL LOW (ref >60)
Glucose, Bld: 94 mg/dL (ref 70–99)
Potassium: 3.5 mmol/L (ref 3.5–5.1)
Sodium: 140 mmol/L (ref 135–145)

## 2022-08-04 LAB — CBC
HCT: 33 % — ABNORMAL LOW (ref 36.0–46.0)
Hemoglobin: 10.8 g/dL — ABNORMAL LOW (ref 12.0–15.0)
MCH: 31.6 pg (ref 26.0–34.0)
MCHC: 32.7 g/dL (ref 30.0–36.0)
MCV: 96.5 fL (ref 80.0–100.0)
Platelets: 199 10*3/uL (ref 150–400)
RBC: 3.42 MIL/uL — ABNORMAL LOW (ref 3.87–5.11)
RDW: 12.9 % (ref 11.5–15.5)
WBC: 10.3 10*3/uL (ref 4.0–10.5)
nRBC: 0 % (ref 0.0–0.2)

## 2022-08-04 LAB — PHOSPHORUS: Phosphorus: 3.1 mg/dL (ref 2.5–4.6)

## 2022-08-04 LAB — MAGNESIUM: Magnesium: 2 mg/dL (ref 1.7–2.4)

## 2022-08-04 MED ORDER — ATORVASTATIN CALCIUM 20 MG PO TABS
20.0000 mg | ORAL_TABLET | Freq: Every evening | ORAL | Status: DC
  Administered 2022-08-04: 20 mg via ORAL
  Filled 2022-08-04: qty 1

## 2022-08-04 MED ORDER — METOPROLOL TARTRATE 50 MG PO TABS
75.0000 mg | ORAL_TABLET | Freq: Two times a day (BID) | ORAL | Status: DC
  Administered 2022-08-04: 75 mg via ORAL
  Filled 2022-08-04: qty 1

## 2022-08-04 MED ORDER — MELATONIN 5 MG PO TABS
5.0000 mg | ORAL_TABLET | Freq: Every evening | ORAL | Status: DC | PRN
  Administered 2022-08-04: 5 mg via ORAL
  Filled 2022-08-04: qty 1

## 2022-08-04 MED ORDER — POTASSIUM CHLORIDE CRYS ER 10 MEQ PO TBCR
40.0000 meq | EXTENDED_RELEASE_TABLET | Freq: Once | ORAL | Status: AC
  Administered 2022-08-04: 40 meq via ORAL
  Filled 2022-08-04: qty 2

## 2022-08-04 MED ORDER — METOPROLOL TARTRATE 25 MG PO TABS
25.0000 mg | ORAL_TABLET | Freq: Once | ORAL | Status: AC
  Administered 2022-08-04: 25 mg via ORAL
  Filled 2022-08-04: qty 1

## 2022-08-04 NOTE — Progress Notes (Signed)
Progress Note    Maria Gutierrez  ZOX:[id number] DOB: 07/29/1940  DOA: 07/30/2022 PCP: Jerl Mina, MD      Brief Narrative:    Medical records reviewed and are as summarized below:  Maria Gutierrez is a 82 y.o. female  with medical history significant for hypertension, class III obesity, BMI 40-49.9, depression with anxiety, CKD-3a, breast cancer (s/p of radiation and chemotherapy 2016), gout, recently hospitalized from 06/07/2022 to 06/10/2022 with severe sepsis secondary to acute colitis.  She presented to the hospital with shortness of breath, cough, congestion, fever and chills.  She was tachycardic, tachypneic in the ED.  She also had leukocytosis with WBC of 19,000.   She was found to have acute pulmonary embolism with right heart strain, acute hypoxic respiratory failure and severe sepsis secondary to pneumonia.          Assessment/Plan:   Principal Problem:   Acute massive pulmonary embolism (HCC) Active Problems:   Community acquired pneumonia   Severe sepsis (HCC)   Acute respiratory failure with hypoxia (HCC)   Tachyarrhythmia   Hypotension   Acute renal failure superimposed on stage 3a chronic kidney disease (HCC)   Obesity, Class III, BMI 40-49.9 (morbid obesity) (HCC)   Carcinoma of overlapping sites of right breast in female, estrogen receptor positive (HCC)   Multiple subsegmental pulmonary emboli without acute cor pulmonale (HCC)   Acute respiratory distress   Atrial fibrillation with rapid ventricular response (HCC)   Nutrition Problem: Inadequate oral intake Etiology: acute illness  Signs/Symptoms: per patient/family report   Body mass index is 42.42 kg/m.  (Morbid obesity)    Acute pulmonary embolism with right heart strain: S/p mechanical thrombectomy to bilateral lobar pulmonary arteries on 08/02/2023.  Continue Eliquis   Severe sepsis secondary to pneumonia: Completed antibiotics.  Discontinue IV cefepime. No growth on blood  cultures.   Acute hypoxic respiratory failure: Improved   Atrial fibrillation with rapid ventricular response.  Continue Eliquis, metoprolol and amiodarone. Metoprolol has been increased from 50 mg bid to 75 mg bid   Hypomagnesemia and hypokalemia: Improved.  Continue potassium repletion   AKI on CKD stage IIIa: Improved   Loose stools: Imodium as needed.   Thrombocytopenia: Resolved   History of breast cancer s/p chemotherapy and radiation in 2016   Diet Order             Diet regular Fluid consistency: Thin  Diet effective now                            Consultants: Vascular surgeon Cardiologist  Procedures: Mechanical thrombectomy bilateral lobar pulmonary arteries on 08/02/2022    Medications:    amiodarone  400 mg Oral BID   apixaban  10 mg Oral BID   Followed by   Melene Muller ON 08/10/2022] apixaban  5 mg Oral BID   atorvastatin  20 mg Oral QPM   feeding supplement  237 mL Oral TID BM   levalbuterol  0.63 mg Nebulization TID   metoprolol tartrate  25 mg Oral Once   metoprolol tartrate  75 mg Oral BID   multivitamin with minerals  1 tablet Oral Daily   tamoxifen  20 mg Oral Daily   venlafaxine XR  75 mg Oral Q breakfast   Continuous Infusions:     Anti-infectives (From admission, onward)    Start     Dose/Rate Route Frequency Ordered Stop   08/02/22 1455  ceFAZolin (  ANCEF) IVPB 1 g/50 mL premix  Status:  Discontinued        over 30 Minutes  Continuous PRN 08/02/22 1459 08/02/22 1624   08/02/22 1330  ceFAZolin (ANCEF) IVPB 2g/100 mL premix  Status:  Discontinued        2 g 200 mL/hr over 30 Minutes Intravenous 30 min pre-op 08/02/22 0716 08/02/22 1624   08/02/22 0600  vancomycin (VANCOREADY) IVPB 1500 mg/300 mL  Status:  Discontinued        1,500 mg 150 mL/hr over 120 Minutes Intravenous Every 48 hours 07/31/22 1240 07/31/22 1403   08/01/22 0000  azithromycin (ZITHROMAX) 500 mg in sodium chloride 0.9 % 250 mL IVPB  Status:  Discontinued         500 mg 250 mL/hr over 60 Minutes Intravenous Every 24 hours 07/31/22 1405 08/03/22 1319   07/31/22 1000  ceFEPIme (MAXIPIME) 2 g in sodium chloride 0.9 % 100 mL IVPB  Status:  Discontinued        2 g 200 mL/hr over 30 Minutes Intravenous Every 12 hours 07/31/22 0331 08/04/22 1216   07/31/22 0630  vancomycin (VANCOREADY) IVPB 1250 mg/250 mL       See Hyperspace for full Linked Orders Report.   1,250 mg 166.7 mL/hr over 90 Minutes Intravenous  Once 07/31/22 0354 07/31/22 0844   07/31/22 0500  vancomycin (VANCOREADY) IVPB 1250 mg/250 mL       See Hyperspace for full Linked Orders Report.   1,250 mg 166.7 mL/hr over 90 Minutes Intravenous  Once 07/31/22 0354 07/31/22 0844   07/31/22 0354  vancomycin variable dose per unstable renal function (pharmacist dosing)  Status:  Discontinued         Does not apply See admin instructions 07/31/22 0354 07/31/22 1240   07/31/22 0330  vancomycin (VANCOCIN) IVPB 1000 mg/200 mL premix  Status:  Discontinued        1,000 mg 200 mL/hr over 60 Minutes Intravenous  Once 07/31/22 0327 07/31/22 0331   07/31/22 0330  ceFEPIme (MAXIPIME) 2 g in sodium chloride 0.9 % 100 mL IVPB  Status:  Discontinued        2 g 200 mL/hr over 30 Minutes Intravenous  Once 07/31/22 0327 07/31/22 0331   07/30/22 2145  cefTRIAXone (ROCEPHIN) 1 g in sodium chloride 0.9 % 100 mL IVPB        1 g 200 mL/hr over 30 Minutes Intravenous  Once 07/30/22 2143 07/30/22 2240   07/30/22 2145  azithromycin (ZITHROMAX) 500 mg in sodium chloride 0.9 % 250 mL IVPB        500 mg 250 mL/hr over 60 Minutes Intravenous  Once 07/30/22 2143 07/31/22 0001              Family Communication/Anticipated D/C date and plan/Code Status   DVT prophylaxis:  apixaban (ELIQUIS) tablet 10 mg  apixaban (ELIQUIS) tablet 5 mg     Code Status: Full Code  Family Communication: Plan discussed with the husband at the bedside Disposition Plan: Plan to discharge home tomorrow   Status is:  Inpatient Remains inpatient appropriate because: Acute PE, pneumonia       Subjective:   Interval events noted.  No chest pain or shortness of breath.  Her husband was at the bedside.  Objective:    Vitals:   08/04/22 0447 08/04/22 0500 08/04/22 0746 08/04/22 0811  BP: 127/79   (!) 127/91  Pulse: 87   69  Resp: 18   16  Temp: 98.2 F (  36.8 C)   98.1 F (36.7 C)  TempSrc: Oral     SpO2: 96%  97% 96%  Weight:  106.9 kg    Height:       No data found.   Intake/Output Summary (Last 24 hours) at 08/04/2022 1233 Last data filed at 08/03/2022 1600 Gross per 24 hour  Intake 290 ml  Output --  Net 290 ml   Filed Weights   08/02/22 0500 08/03/22 0500 08/04/22 0500  Weight: 116.2 kg 106.2 kg 106.9 kg    Exam:  GEN: NAD SKIN: Warm and dry EYES: EOMI ENT: MMM CV: Irregular rate and rhythm, tachycardic PULM: Decreased air entry bilaterally ABD: soft, obese, NT, +BS CNS: AAO x 3, non focal EXT: No edema or tenderness       Data Reviewed:   I have personally reviewed following labs and imaging studies:  Labs: Labs show the following:   Basic Metabolic Panel: Recent Labs  Lab 07/31/22 0815 07/31/22 1108 07/31/22 2315 08/01/22 0508 08/02/22 0647 08/03/22 0517 08/04/22 0637  NA  --    < > 135 138 139 140 140  K  --    < > 3.1* 3.1* 3.9 3.6 3.5  CL  --    < > 101 104 106 108 109  CO2  --    < > 26 27 24 23 23   GLUCOSE  --    < > 130* 119* 104* 88 94  BUN  --    < > 15 14 14 16 13   CREATININE  --    < > 1.35* 1.28* 1.19* 1.07* 1.08*  CALCIUM  --    < > 8.0* 7.9* 8.6* 8.3* 8.5*  MG 1.2*  --  2.1  --  1.8 1.6* 2.0  PHOS  --   --   --   --  3.5 3.5 3.1   < > = values in this interval not displayed.   GFR Estimated Creatinine Clearance: 47.4 mL/min (A) (by C-G formula based on SCr of 1.08 mg/dL (H)). Liver Function Tests: Recent Labs  Lab 07/30/22 2053  AST 34  ALT 15  ALKPHOS 54  BILITOT 1.5*  PROT 6.9  ALBUMIN 3.4*   No results for input(s):  "LIPASE", "AMYLASE" in the last 168 hours. No results for input(s): "AMMONIA" in the last 168 hours. Coagulation profile Recent Labs  Lab 07/30/22 2053  INR 1.1    CBC: Recent Labs  Lab 07/31/22 1108 08/01/22 0508 08/02/22 0647 08/03/22 0517 08/04/22 0637  WBC 18.1* 14.8* 13.0* 11.1* 10.3  HGB 11.8* 12.0 12.1 11.3* 10.8*  HCT 35.8* 36.3 36.5 34.1* 33.0*  MCV 97.0 95.5 96.3 97.7 96.5  PLT 136* 136* 167 186 199   Cardiac Enzymes: No results for input(s): "CKTOTAL", "CKMB", "CKMBINDEX", "TROPONINI" in the last 168 hours. BNP (last 3 results) No results for input(s): "PROBNP" in the last 8760 hours. CBG: Recent Labs  Lab 07/31/22 1814  GLUCAP 134*   D-Dimer: No results for input(s): "DDIMER" in the last 72 hours. Hgb A1c: No results for input(s): "HGBA1C" in the last 72 hours. Lipid Profile: No results for input(s): "CHOL", "HDL", "LDLCALC", "TRIG", "CHOLHDL", "LDLDIRECT" in the last 72 hours. Thyroid function studies: No results for input(s): "TSH", "T4TOTAL", "T3FREE", "THYROIDAB" in the last 72 hours.  Invalid input(s): "FREET3" Anemia work up: No results for input(s): "VITAMINB12", "FOLATE", "FERRITIN", "TIBC", "IRON", "RETICCTPCT" in the last 72 hours. Sepsis Labs: Recent Labs  Lab 07/30/22 2220 07/31/22 0815 07/31/22 1108 08/01/22  1610 08/02/22 0647 08/03/22 0517 08/04/22 0637  PROCALCITON  --  0.53  --   --   --   --   --   WBC  --   --    < > 14.8* 13.0* 11.1* 10.3  LATICACIDVEN 1.6  --   --   --   --   --   --    < > = values in this interval not displayed.    Microbiology Recent Results (from the past 240 hour(s))  Culture, blood (routine x 2)     Status: None   Collection Time: 07/30/22 10:20 PM   Specimen: BLOOD  Result Value Ref Range Status   Specimen Description BLOOD BLOOD RIGHT ARM  Final   Special Requests   Final    BOTTLES DRAWN AEROBIC AND ANAEROBIC Blood Culture results may not be optimal due to an inadequate volume of blood  received in culture bottles   Culture   Final    NO GROWTH 5 DAYS Performed at [facility], [address] [place], [place] [address]    Report Status 08/04/2022 FINAL  Final  Culture, blood (routine x 2)     Status: None   Collection Time: 07/30/22 10:21 PM   Specimen: BLOOD  Result Value Ref Range Status   Specimen Description BLOOD BLOOD LEFT ARM  Final   Special Requests   Final    BOTTLES DRAWN AEROBIC AND ANAEROBIC Blood Culture results may not be optimal due to an inadequate volume of blood received in culture bottles   Culture   Final    NO GROWTH 5 DAYS Performed at [facility], [address] [place], [place] [address]    Report Status 08/04/2022 FINAL  Final  MRSA Next Gen by PCR, Nasal     Status: None   Collection Time: 07/31/22 11:08 AM   Specimen: Nasal Mucosa; Nasal Swab  Result Value Ref Range Status   MRSA by PCR Next Gen NOT DETECTED NOT DETECTED Final    Comment: (NOTE) The GeneXpert MRSA Assay (FDA approved for NASAL specimens only), is one component of a comprehensive MRSA colonization surveillance program. It is not intended to diagnose MRSA infection nor to guide or monitor treatment for MRSA infections. Test performance is not FDA approved in patients less than 29 years old. Performed at [facility], [address] [place], [place] [address]     Procedures and diagnostic studies:  PERIPHERAL VASCULAR CATHETERIZATION  Result Date: 08/02/2022 See surgical note for result.              LOS: 5 days   Maria Gutierrez  [facility]   Pager on [email or web address]. If 7PM-7AM, please contact night-coverage at [email or web address]     08/04/2022, 12:33 PM

## 2022-08-04 NOTE — Progress Notes (Signed)
Rounding Note    Patient Name: Maria Gutierrez Date of Encounter: 08/04/2022  [facility] Cardiologist: Lorine Bears, MD   Subjective   Resting in bed, reports some shortness of breath on exertion Continues on metoprolol 50 twice daily, amiodarone 400 twice daily load started yesterday for rate and rhythm control Transition from heparin to Eliquis yesterday  Inpatient Medications    Scheduled Meds:  amiodarone  400 mg Oral BID   apixaban  10 mg Oral BID   Followed by   Melene Muller ON 08/10/2022] apixaban  5 mg Oral BID   atorvastatin  20 mg Oral QPM   feeding supplement  237 mL Oral TID BM   levalbuterol  0.63 mg Nebulization TID   metoprolol tartrate  25 mg Oral Once   metoprolol tartrate  75 mg Oral BID   multivitamin with minerals  1 tablet Oral Daily   tamoxifen  20 mg Oral Daily   venlafaxine XR  75 mg Oral Q breakfast   Continuous Infusions:  ceFEPime (MAXIPIME) IV 2 g (08/04/22 1007)   PRN Meds: acetaminophen **OR** acetaminophen, guaiFENesin-dextromethorphan, loperamide, metoprolol tartrate, ondansetron **OR** ondansetron (ZOFRAN) IV, mouth rinse, phenol   Vital Signs    Vitals:   08/04/22 0447 08/04/22 0500 08/04/22 0746 08/04/22 0811  BP: 127/79   (!) 127/91  Pulse: 87   69  Resp: 18   16  Temp: 98.2 F (36.8 C)   98.1 F (36.7 C)  TempSrc: Oral     SpO2: 96%  97% 96%  Weight:  106.9 kg    Height:        Intake/Output Summary (Last 24 hours) at 08/04/2022 1203 Last data filed at 08/03/2022 1600 Gross per 24 hour  Intake 290 ml  Output --  Net 290 ml      08/04/2022    5:00 AM 08/03/2022    5:00 AM 08/02/2022    5:00 AM  Last 3 Weights  Weight (lbs) 235 lb 10.8 oz 234 lb 2.1 oz 256 lb 2.8 oz  Weight (kg) 106.9 kg 106.2 kg 116.2 kg      Telemetry    Atrial fibrillation rate 100 up to 110- Personally Reviewed  ECG     - Personally Reviewed  Physical Exam   GEN: No acute distress.  Obese Neck: No JVD Cardiac: Irregularly irregular,  no murmurs, rubs, or gallops.  Respiratory: Clear to auscultation bilaterally.  Scattered Rales GI: Soft, nontender, non-distended  MS: No edema; No deformity. Neuro:  Nonfocal  Psych: Normal affect   Labs    High Sensitivity Troponin:   Recent Labs  Lab 07/31/22 0105 07/31/22 0319  TROPONINIHS 24* 25*     Chemistry Recent Labs  Lab 07/30/22 2053 07/31/22 0815 08/02/22 0647 08/03/22 0517 08/04/22 0637  NA 133*   < > 139 140 140  K 3.0*   < > 3.9 3.6 3.5  CL 100   < > 106 108 109  CO2 22   < > 24 23 23   GLUCOSE 157*   < > 104* 88 94  BUN 20   < > 14 16 13   CREATININE 1.60*   < > 1.19* 1.07* 1.08*  CALCIUM 8.7*   < > 8.6* 8.3* 8.5*  MG  --    < > 1.8 1.6* 2.0  PROT 6.9  --   --   --   --   ALBUMIN 3.4*  --   --   --   --  AST 34  --   --   --   --   ALT 15  --   --   --   --   ALKPHOS 54  --   --   --   --   BILITOT 1.5*  --   --   --   --   GFRNONAA 32*   < > 46* 52* 52*  ANIONGAP 11   < > 9 9 8    < > = values in this interval not displayed.    Lipids No results for input(s): "CHOL", "TRIG", "HDL", "LABVLDL", "LDLCALC", "CHOLHDL" in the last 168 hours.  Hematology Recent Labs  Lab 08/02/22 0647 08/03/22 0517 08/04/22 0637  WBC 13.0* 11.1* 10.3  RBC 3.79* 3.49* 3.42*  HGB 12.1 11.3* 10.8*  HCT 36.5 34.1* 33.0*  MCV 96.3 97.7 96.5  MCH 31.9 32.4 31.6  MCHC 33.2 33.1 32.7  RDW 12.8 12.9 12.9  PLT 167 186 199   Thyroid  Recent Labs  Lab 07/31/22 0815  TSH 5.070*    BNP Recent Labs  Lab 07/30/22 2053  BNP 158.9*    DDimer No results for input(s): "DDIMER" in the last 168 hours.   Radiology    PERIPHERAL VASCULAR CATHETERIZATION  Result Date: 08/02/2022 See surgical note for result.   Cardiac Studies   2D Echocardiogram 6.3.2024    1. Left ventricular ejection fraction, by estimation, is 60 to 65%. The  left ventricle has normal function. Left ventricular endocardial border  not optimally defined to evaluate regional wall motion. mild to  moderate  left ventricular hypertrophy. Left  ventricular diastolic function could not be evaluated.   2. Right ventricular systolic function is normal. The right ventricular  size is mildly enlarged. There is mildly elevated pulmonary artery  systolic pressure.   3. The mitral valve is grossly normal. Trivial mitral valve  regurgitation. No evidence of mitral stenosis.   4. The aortic valve is tricuspid. Aortic valve regurgitation is not  visualized. No aortic stenosis is present.   5. There is borderline dilatation of the ascending aorta, measuring 36  mm.   Patient Profile     82 y.o. female with a hx of HTN, obesity, depression, anxiety, CKD stage 3, breast cancer s/p radiation and chemo 2016, gout, who presented with shortness of breath on June first and was found to have bilateral pulmonary embolism tachycardia.   Assessment & Plan    Bilateral pulmonary emboli Mild right heart strain on CT scan that echocardiogram Mechanical thrombectomy, TPA 2 days ago with improvement of symptoms Has been transition to Eliquis 10 twice daily for 1 week then down to 5 twice daily  Atrial fibrillation with RVR Presenting in sinus tachycardia/MAT, developing atrial fibrillation with RVR June 3rd Difficulty with rate controlled on metoprolol alone Continue amiodarone 400 twice daily 10 days then down to 200 twice daily Metoprolol to tartrate up to 75 twice daily with close monitoring of blood pressure outpatient cardioversion once clinically stable if still in atrial fibrillation  Chronic kidney disease stage III Stable renal function  Husband at the bedside, all questions answered, discussed need to slowly start getting out of bed, slow movements  Total encounter time more than 50 minutes  Greater than 50% was spent in counseling and coordination of care with the patient   For questions or updates, please contact [facility] Please consult [email or web address] for contact info under         Signed, Julien Nordmann, MD  08/04/2022, 12:03 PM

## 2022-08-04 NOTE — Care Management Important Message (Signed)
Important Message  Patient Details  Name: Maria Gutierrez MRN: [id number] Date of Birth: 1940-05-13   Medicare Important Message Given:  Yes     Olegario Messier A Luke Rigsbee 08/04/2022, 1:50 PM

## 2022-08-04 NOTE — Progress Notes (Signed)
PHARMACIST - PHYSICIAN ORDER COMMUNICATION  CONCERNING: Amiodarone and Simvastatin  and risk of rhabdomyolysis  DESCRIPTION:  Patients on amiodarone and simvastatin >20 mg/day have reported cases of rhabdomyolysis. Pharmacy is to assess simvastatin dose. If >20 mg, substitute atorvastatin (Lipitor) 1mg  for each 2mg  simvastatin.  This patient is ordered simvastatin 40mg  and Amiodarone  400mg  BID.    ACTION TAKEN: Per protocol pharmacy has discontinued the patient's order for simvastatin and replaced it with Atorvastatin 20mg .    Gardner Candle, PharmD, BCPS Clinical Pharmacist 08/04/2022 7:45 AM

## 2022-08-05 DIAGNOSIS — I2699 Other pulmonary embolism without acute cor pulmonale: Secondary | ICD-10-CM | POA: Diagnosis not present

## 2022-08-05 DIAGNOSIS — I2694 Multiple subsegmental pulmonary emboli without acute cor pulmonale: Secondary | ICD-10-CM | POA: Diagnosis not present

## 2022-08-05 DIAGNOSIS — R0603 Acute respiratory distress: Secondary | ICD-10-CM | POA: Diagnosis not present

## 2022-08-05 DIAGNOSIS — I4891 Unspecified atrial fibrillation: Secondary | ICD-10-CM | POA: Diagnosis not present

## 2022-08-05 DIAGNOSIS — J189 Pneumonia, unspecified organism: Secondary | ICD-10-CM | POA: Diagnosis not present

## 2022-08-05 LAB — CBC
HCT: 34.9 % — ABNORMAL LOW (ref 36.0–46.0)
Hemoglobin: 11.7 g/dL — ABNORMAL LOW (ref 12.0–15.0)
MCH: 32.4 pg (ref 26.0–34.0)
MCHC: 33.5 g/dL (ref 30.0–36.0)
MCV: 96.7 fL (ref 80.0–100.0)
Platelets: 247 10*3/uL (ref 150–400)
RBC: 3.61 MIL/uL — ABNORMAL LOW (ref 3.87–5.11)
RDW: 12.7 % (ref 11.5–15.5)
WBC: 11.1 10*3/uL — ABNORMAL HIGH (ref 4.0–10.5)
nRBC: 0 % (ref 0.0–0.2)

## 2022-08-05 MED ORDER — APIXABAN 5 MG PO TABS: 5.0000 mg | ORAL_TABLET | Freq: Two times a day (BID) | ORAL | 0 refills | Status: DC

## 2022-08-05 MED ORDER — METOPROLOL TARTRATE 100 MG PO TABS: 100.0000 mg | ORAL_TABLET | Freq: Two times a day (BID) | ORAL | 0 refills | Status: DC

## 2022-08-05 MED ORDER — FUROSEMIDE 20 MG PO TABS: 20.0000 mg | ORAL_TABLET | Freq: Every day | ORAL | Status: DC

## 2022-08-05 MED ORDER — FUROSEMIDE 20 MG PO TABS: 20.0000 mg | ORAL_TABLET | Freq: Every day | ORAL | 1 refills | Status: DC

## 2022-08-05 MED ORDER — DIPHENHYDRAMINE HCL 12.5 MG/5ML PO ELIX
12.5000 mg | ORAL_SOLUTION | Freq: Once | ORAL | Status: DC
  Filled 2022-08-05: qty 5

## 2022-08-05 MED ORDER — AMIODARONE HCL 400 MG PO TABS: ORAL_TABLET | ORAL | 0 refills | Status: DC

## 2022-08-05 MED ORDER — METOPROLOL TARTRATE 50 MG PO TABS
100.0000 mg | ORAL_TABLET | Freq: Two times a day (BID) | ORAL | Status: DC
  Administered 2022-08-05: 100 mg via ORAL
  Filled 2022-08-05: qty 2

## 2022-08-05 MED ORDER — POTASSIUM CHLORIDE CRYS ER 20 MEQ PO TBCR: 20.0000 meq | EXTENDED_RELEASE_TABLET | Freq: Every day | ORAL | 1 refills | Status: DC

## 2022-08-05 MED ORDER — LEVALBUTEROL HCL 0.63 MG/3ML IN NEBU: 0.6300 mg | INHALATION_SOLUTION | Freq: Four times a day (QID) | RESPIRATORY_TRACT | Status: DC | PRN

## 2022-08-05 MED ORDER — METOPROLOL TARTRATE 75 MG PO TABS: 75.0000 mg | ORAL_TABLET | Freq: Two times a day (BID) | ORAL | 0 refills | Status: DC

## 2022-08-05 MED ORDER — POTASSIUM CHLORIDE CRYS ER 20 MEQ PO TBCR: 20.0000 meq | EXTENDED_RELEASE_TABLET | Freq: Every day | ORAL | Status: DC

## 2022-08-05 MED ORDER — APIXABAN 5 MG PO TABS: 10.0000 mg | ORAL_TABLET | Freq: Two times a day (BID) | ORAL | 0 refills | Status: DC

## 2022-08-05 NOTE — Progress Notes (Signed)
Rounding Note    Patient Name: Maria Gutierrez Date of Encounter: 08/05/2022  [facility] Cardiologist: Lorine Bears, MD   Subjective   Reports she did not sleep well last night Reports breathing much better, hoping she can go home today Heart rate elevated on exertion to the bathroom, has not received her morning medication Husband at the bedside  Inpatient Medications    Scheduled Meds:  amiodarone  400 mg Oral BID   apixaban  10 mg Oral BID   Followed by   Melene Muller ON 08/10/2022] apixaban  5 mg Oral BID   atorvastatin  20 mg Oral QPM   diphenhydrAMINE  12.5 mg Oral Once   feeding supplement  237 mL Oral TID BM   metoprolol tartrate  100 mg Oral BID   multivitamin with minerals  1 tablet Oral Daily   tamoxifen  20 mg Oral Daily   venlafaxine XR  75 mg Oral Q breakfast   Continuous Infusions:   PRN Meds: acetaminophen **OR** acetaminophen, guaiFENesin-dextromethorphan, levalbuterol, loperamide, melatonin, metoprolol tartrate, ondansetron **OR** ondansetron (ZOFRAN) IV, mouth rinse, phenol   Vital Signs    Vitals:   08/04/22 1942 08/04/22 1949 08/05/22 0507 08/05/22 0750  BP: (!) 112/93  112/77 128/74  Pulse: (!) 56  95 93  Resp: 18  18 18   Temp: 98.4 F (36.9 C)  97.6 F (36.4 C) 98 F (36.7 C)  TempSrc:   Oral   SpO2: 98% 96% 96% 95%  Weight:      Height:        Intake/Output Summary (Last 24 hours) at 08/05/2022 1143 Last data filed at 08/04/2022 1948 Gross per 24 hour  Intake 240 ml  Output --  Net 240 ml      08/04/2022    5:00 AM 08/03/2022    5:00 AM 08/02/2022    5:00 AM  Last 3 Weights  Weight (lbs) 235 lb 10.8 oz 234 lb 2.1 oz 256 lb 2.8 oz  Weight (kg) 106.9 kg 106.2 kg 116.2 kg      Telemetry    Atrial fibrillation rate 90 to 110, up to 120 with exertion- Personally Reviewed  ECG     - Personally Reviewed  Physical Exam   Constitutional:  oriented to person, place, and time. No distress.  HENT:  Head: Grossly normal Eyes:   no discharge. No scleral icterus.  Neck: No JVD, no carotid bruits  Cardiovascular: Irregularly irregular, tachycardic,, no murmurs appreciated Pulmonary/Chest: Clear to auscultation bilaterally, no wheezes or rails Abdominal: Soft.  no distension.  no tenderness.  Musculoskeletal: Normal range of motion Neurological:  normal muscle tone. Coordination normal. No atrophy Skin: Skin warm and dry Psychiatric: normal affect, pleasant   Labs    High Sensitivity Troponin:   Recent Labs  Lab 07/31/22 0105 07/31/22 0319  TROPONINIHS 24* 25*     Chemistry Recent Labs  Lab 07/30/22 2053 07/31/22 0815 08/02/22 0647 08/03/22 0517 08/04/22 0637  NA 133*   < > 139 140 140  K 3.0*   < > 3.9 3.6 3.5  CL 100   < > 106 108 109  CO2 22   < > 24 23 23   GLUCOSE 157*   < > 104* 88 94  BUN 20   < > 14 16 13   CREATININE 1.60*   < > 1.19* 1.07* 1.08*  CALCIUM 8.7*   < > 8.6* 8.3* 8.5*  MG  --    < > 1.8 1.6* 2.0  PROT 6.9  --   --   --   --   ALBUMIN 3.4*  --   --   --   --   AST 34  --   --   --   --   ALT 15  --   --   --   --   ALKPHOS 54  --   --   --   --   BILITOT 1.5*  --   --   --   --   GFRNONAA 32*   < > 46* 52* 52*  ANIONGAP 11   < > 9 9 8    < > = values in this interval not displayed.    Lipids No results for input(s): "CHOL", "TRIG", "HDL", "LABVLDL", "LDLCALC", "CHOLHDL" in the last 168 hours.  Hematology Recent Labs  Lab 08/03/22 0517 08/04/22 0637 08/05/22 0716  WBC 11.1* 10.3 11.1*  RBC 3.49* 3.42* 3.61*  HGB 11.3* 10.8* 11.7*  HCT 34.1* 33.0* 34.9*  MCV 97.7 96.5 96.7  MCH 32.4 31.6 32.4  MCHC 33.1 32.7 33.5  RDW 12.9 12.9 12.7  PLT 186 199 247   Thyroid  Recent Labs  Lab 07/31/22 0815  TSH 5.070*    BNP Recent Labs  Lab 07/30/22 2053  BNP 158.9*    DDimer No results for input(s): "DDIMER" in the last 168 hours.   Radiology    No results found.  Cardiac Studies   2D Echocardiogram 6.3.2024    1. Left ventricular ejection fraction, by  estimation, is 60 to 65%. The  left ventricle has normal function. Left ventricular endocardial border  not optimally defined to evaluate regional wall motion. mild to moderate  left ventricular hypertrophy. Left  ventricular diastolic function could not be evaluated.   2. Right ventricular systolic function is normal. The right ventricular  size is mildly enlarged. There is mildly elevated pulmonary artery  systolic pressure.   3. The mitral valve is grossly normal. Trivial mitral valve  regurgitation. No evidence of mitral stenosis.   4. The aortic valve is tricuspid. Aortic valve regurgitation is not  visualized. No aortic stenosis is present.   5. There is borderline dilatation of the ascending aorta, measuring 36  mm.   Patient Profile     82 y.o. female with a hx of HTN, obesity, depression, anxiety, CKD stage 3, breast cancer s/p radiation and chemo 2016, gout, who presented with shortness of breath on June first and was found to have bilateral pulmonary embolism tachycardia.   Assessment & Plan    Bilateral pulmonary emboli Mild right heart strain on CT scan, mildly elevated right heart pressures on echo Mechanical thrombectomy, TPA 3 days ago with improvement of symptoms Feels her breathing is back to baseline Eliquis 10 twice daily for 1 week then down to 5 twice daily  Atrial fibrillation with RVR Presenting in sinus tachycardia/MAT, developing atrial fibrillation with RVR June 3rd Difficulty with rate controlled on metoprolol alone -Improved rate on amiodarone 400 twice daily, would continue 1 more week then down to 200 twice daily -Metoprolol tartrate up to 100 twice daily Plan for outpatient cardioversion once clinically stable if still in atrial fibrillation -Will add Lasix 20 daily with potassium 20 daily given high risk for diastolic CHF in the setting of atrial fibrillation, PE  Chronic kidney disease stage III Stable renal function    Total encounter time  more than 50 minutes  Greater than 50% was spent in counseling and coordination of  care with the patient   For questions or updates, please contact [facility] Please consult [email or web address] for contact info under        Signed, Julien Nordmann, MD  08/05/2022, 11:43 AM

## 2022-08-05 NOTE — Discharge Summary (Signed)
Physician Discharge Summary   Patient: Maria Gutierrez MRN: [id number] DOB: 09-03-1940  Admit date:     07/30/2022  Discharge date: 08/05/2022  Discharge Physician: Lurene Shadow   PCP: Jerl Mina, MD   Recommendations at discharge:   Follow-up with PCP in 1 week Follow-up with cardiologist (office will call to schedule appointment)  Discharge Diagnoses: Principal Problem:   Acute massive pulmonary embolism (HCC) Active Problems:   Community acquired pneumonia   Severe sepsis (HCC)   Acute respiratory failure with hypoxia (HCC)   Tachyarrhythmia   Hypotension   Acute renal failure superimposed on stage 3a chronic kidney disease (HCC)   Obesity, Class III, BMI 40-49.9 (morbid obesity) (HCC)   Carcinoma of overlapping sites of right breast in female, estrogen receptor positive (HCC)   Multiple subsegmental pulmonary emboli without acute cor pulmonale (HCC)   Acute respiratory distress   Atrial fibrillation with rapid ventricular response (HCC)  Resolved Problems:   * No resolved hospital problems. [facility] Course:  Maria Gutierrez is a 82 y.o. female  with medical history significant for hypertension, class III obesity, BMI 40-49.9, depression with anxiety, CKD-3a, breast cancer (s/p of radiation and chemotherapy 2016), gout, recently hospitalized from 06/07/2022 to 06/10/2022 with severe sepsis secondary to acute colitis.  She presented to the hospital with shortness of breath, cough, congestion, fever and chills.  She was tachycardic, tachypneic in the ED.  She also had leukocytosis with WBC of 19,000.     She was found to have acute pulmonary embolism with right heart strain, acute hypoxic respiratory failure and severe sepsis secondary to pneumonia.    Assessment and Plan:   Acute pulmonary embolism with right heart strain: S/p mechanical thrombectomy to bilateral lobar pulmonary arteries on 08/02/2023.  Continue Eliquis     Severe sepsis secondary to pneumonia: Completed  antibiotics. No growth on blood cultures.     Acute hypoxic respiratory failure: Improved     Atrial fibrillation with rapid ventricular response.  Continue Eliquis, metoprolol and amiodarone. Metoprolol has been increased from 50 mg bid to 100 mg bid     Hypomagnesemia and hypokalemia: Improved.      AKI on CKD stage IIIa: Improved     Loose stools: Improved     Thrombocytopenia: Resolved     History of breast cancer s/p chemotherapy and radiation in 2016    Her condition has improved and she is deemed stable for discharge to home today.  Discharge plan was discussed with the patient and her husband at the bedside.         Consultants: Cardiologist, vascular surgeon Procedures performed: Mechanical thrombectomy bilateral lobar pulmonary arteries on 08/02/2022   Disposition: Home Diet recommendation:  Discharge Diet Orders (From admission, onward)     Start     Ordered   08/05/22 0000  Diet - low sodium heart healthy        08/05/22 1134           Cardiac diet DISCHARGE MEDICATION: Allergies as of 08/05/2022   No Known Allergies      Medication List     STOP taking these medications    aspirin EC 81 MG tablet   fluticasone 50 MCG/ACT nasal spray Commonly known as: Flonase   HYDROcodone-acetaminophen 7.5-325 MG tablet Commonly known as: NORCO   ketorolac 10 MG tablet Commonly known as: TORADOL   Magnesium Oxide (Antacid) 500 MG Caps   triamterene-hydrochlorothiazide 37.5-25 MG tablet Commonly known as: [employer]  TAKE these medications    acetaminophen 325 MG tablet Commonly known as: TYLENOL Take 2 tablets (650 mg total) by mouth every 6 (six) hours as needed for mild pain or fever.   amiodarone 400 MG tablet Commonly known as: PACERONE Take 1 tablet (400 mg total) by mouth 2 (two) times daily for 7 days, THEN 0.5 tablets (200 mg total) 2 (two) times daily for 23 days. Start taking on: August 05, 2022   apixaban 5 MG Tabs  tablet Commonly known as: ELIQUIS Take 2 tablets (10 mg total) by mouth 2 (two) times daily for 4 days.   apixaban 5 MG Tabs tablet Commonly known as: ELIQUIS Take 1 tablet (5 mg total) by mouth 2 (two) times daily. Start taking on: August 10, 2022   calcium carbonate 600 MG Tabs tablet Commonly known as: OS-CAL Take 600 mg by mouth daily.   cyanocobalamin 1000 MCG tablet Take 1,000 mcg by mouth daily.   cyclobenzaprine 10 MG tablet Commonly known as: FLEXERIL 1/2-1 po qHS prn   furosemide 20 MG tablet Commonly known as: LASIX Take 1 tablet (20 mg total) by mouth daily. Start taking on: August 06, 2022   metoprolol succinate 50 MG 24 hr tablet Commonly known as: TOPROL-XL Take 50 mg by mouth every morning. Take with or immediately following a meal.   metoprolol tartrate 100 MG tablet Commonly known as: LOPRESSOR Take 1 tablet (100 mg total) by mouth 2 (two) times daily.   multivitamin with minerals Tabs tablet Take 1 tablet by mouth daily.   pantoprazole 40 MG tablet Commonly known as: PROTONIX Take 40 mg by mouth daily.   potassium chloride SA 20 MEQ tablet Commonly known as: KLOR-CON M Take 1 tablet (20 mEq total) by mouth daily. Start taking on: August 06, 2022   simvastatin 40 MG tablet Commonly known as: ZOCOR Take 40 mg by mouth at bedtime.   tamoxifen 20 MG tablet Commonly known as: NOLVADEX TAKE 1 TABLET BY MOUTH DAILY   venlafaxine XR 75 MG 24 hr capsule Commonly known as: EFFEXOR-XR TAKE 1 CAPSULE BY MOUTH DAILY  WITH BREAKFAST   Vitamin D (Ergocalciferol) 1.25 MG (50000 UNIT) Caps capsule Commonly known as: DRISDOL TAKE 1 CAPSULE BY MOUTH EVERY 7  DAYS TAKE ON MONDAYS        Discharge Exam: Filed Weights   08/02/22 0500 08/03/22 0500 08/04/22 0500  Weight: 116.2 kg 106.2 kg 106.9 kg   GEN: NAD SKIN:Warm and dry EYES: EOMI ENT: MMM CV: RRR PULM: CTA B ABD: soft, obese, NT, +BS CNS: AAO x 3, non focal EXT: No edema or  tenderness   Condition at discharge: good  The results of significant diagnostics from this hospitalization (including imaging, microbiology, ancillary and laboratory) are listed below for reference.   Imaging Studies: PERIPHERAL VASCULAR CATHETERIZATION  Result Date: 08/02/2022 See surgical note for result.  US Venous Img Lower Bilateral (DVT)  Result Date: 08/01/2022 CLINICAL DATA:  Pain and swelling EXAM: Bilateral lower Extremity Venous Doppler Ultrasound TECHNIQUE: Gray-scale sonography with compression, as well as color and duplex ultrasound, were performed to evaluate the deep venous system(s) from the level of the common femoral vein through the popliteal and proximal calf veins. COMPARISON:  None available FINDINGS: VENOUS Normal compressibility of the common femoral, superficial femoral, and popliteal veins, as well as the visualized calf veins. Visualized portions of profunda femoral vein and great saphenous vein unremarkable. No filling defects to suggest DVT on grayscale or color Doppler imaging. Doppler  waveforms show normal direction of venous flow, normal respiratory plasticity and response to augmentation. OTHER None. Limitations: Peroneal veins not well visualized bilaterally. IMPRESSION: No  lower extremity DVT. Electronically Signed   By: Acquanetta Belling M.D.   On: 08/01/2022 16:42   ECHOCARDIOGRAM COMPLETE  Result Date: 08/01/2022    ECHOCARDIOGRAM REPORT   Patient Name:   Maria Gutierrez Date of Exam: 08/01/2022 Medical Rec #:  [id number]     Height:       62.5 in Accession #:    [id number]    Weight:       277.8 lb Date of Birth:  1940-08-12     BSA:          2.211 m Patient Age:    82 years      BP:           111/77 mmHg Patient Gender: F             HR:           109 bpm. Exam Location:  [facility] Procedure: 2D Echo, Cardiac Doppler and Color Doppler Indications:     CHF  History:         Patient has no prior history of Echocardiogram examinations.                  CHF,  Arrythmias:Tachycardia, Signs/Symptoms:Shortness of                  Breath; Risk Factors:Hypertension and Dyslipidemia. CKD, Breast                  CA, Pulmonary embolus.  Sonographer:     Mikki Harbor Referring Phys:  [id number] Andris Baumann Diagnosing Phys: Yvonne Kendall MD  Sonographer Comments: Patient is obese. IMPRESSIONS  1. Left ventricular ejection fraction, by estimation, is 60 to 65%. The left ventricle has normal function. Left ventricular endocardial border not optimally defined to evaluate regional wall motion. mild to moderate left ventricular hypertrophy. Left ventricular diastolic function could not be evaluated.  2. Right ventricular systolic function is normal. The right ventricular size is mildly enlarged. There is mildly elevated pulmonary artery systolic pressure.  3. The mitral valve is grossly normal. Trivial mitral valve regurgitation. No evidence of mitral stenosis.  4. The aortic valve is tricuspid. Aortic valve regurgitation is not visualized. No aortic stenosis is present.  5. There is borderline dilatation of the ascending aorta, measuring 36 mm. FINDINGS  Left Ventricle: Left ventricular ejection fraction, by estimation, is 60 to 65%. The left ventricle has normal function. Left ventricular endocardial border not optimally defined to evaluate regional wall motion. The left ventricular internal cavity size was normal in size. Mild to moderate left ventricular hypertrophy. Left ventricular diastolic function could not be evaluated due to atrial fibrillation. Left ventricular diastolic function could not be evaluated. Right Ventricle: The right ventricular size is mildly enlarged. No increase in right ventricular wall thickness. Right ventricular systolic function is normal. There is mildly elevated pulmonary artery systolic pressure. The tricuspid regurgitant velocity is 2.90 m/s, and with an assumed right atrial pressure of 8 mmHg, the estimated right ventricular systolic  pressure is 41.6 mmHg. Left Atrium: Left atrial size was normal in size. Right Atrium: Right atrial size was normal in size. Pericardium: There is no evidence of pericardial effusion. Mitral Valve: The mitral valve is grossly normal. Mild to moderate mitral annular calcification. Trivial mitral valve regurgitation. No evidence of mitral valve stenosis. MV peak  gradient, 5.8 mmHg. The mean mitral valve gradient is 2.0 mmHg. Tricuspid Valve: The tricuspid valve is normal in structure. Tricuspid valve regurgitation is mild. Aortic Valve: The aortic valve is tricuspid. Aortic valve regurgitation is not visualized. No aortic stenosis is present. Aortic valve mean gradient measures 3.5 mmHg. Aortic valve peak gradient measures 6.7 mmHg. Aortic valve area, by VTI measures 2.27 cm. Pulmonic Valve: The pulmonic valve was grossly normal. Pulmonic valve regurgitation is not visualized. No evidence of pulmonic stenosis. Aorta: The aortic root is normal in size and structure. There is borderline dilatation of the ascending aorta, measuring 36 mm. There is protruding plaque involving the ascending aorta. Pulmonary Artery: The pulmonary artery is of normal size. Venous: The inferior vena cava was not well visualized. IAS/Shunts: The interatrial septum was not well visualized.  LEFT VENTRICLE PLAX 2D LVIDd:         4.20 cm LVIDs:         2.80 cm LV PW:         1.30 cm LV IVS:        1.30 cm LVOT diam:     2.00 cm LV SV:         48 LV SV Index:   22 LVOT Area:     3.14 cm  RIGHT VENTRICLE RV Basal diam:  3.85 cm RV Mid diam:    3.90 cm RV S prime:     27.80 cm/s LEFT ATRIUM           Index        RIGHT ATRIUM           Index LA diam:      3.20 cm 1.45 cm/m   RA Area:     15.80 cm LA Vol (A2C): 30.9 ml 13.97 ml/m  RA Volume:   49.00 ml  22.16 ml/m LA Vol (A4C): 32.1 ml 14.52 ml/m  AORTIC VALVE                    PULMONIC VALVE AV Area (Vmax):    2.29 cm     PV Vmax:       1.20 m/s AV Area (Vmean):   2.16 cm     PV Peak grad:   5.7 mmHg AV Area (VTI):     2.27 cm AV Vmax:           129.00 cm/s AV Vmean:          85.850 cm/s AV VTI:            0.211 m AV Peak Grad:      6.7 mmHg AV Mean Grad:      3.5 mmHg LVOT Vmax:         94.20 cm/s LVOT Vmean:        59.100 cm/s LVOT VTI:          0.152 m LVOT/AV VTI ratio: 0.72  AORTA Ao Root diam: 3.40 cm Ao Asc diam:  3.60 cm MITRAL VALVE                TRICUSPID VALVE MV Area (PHT): 3.80 cm     TR Peak grad:   33.6 mmHg MV Area VTI:   2.06 cm     TR Vmax:        290.00 cm/s MV Peak grad:  5.8 mmHg MV Mean grad:  2.0 mmHg     SHUNTS MV Vmax:       1.20 m/s  Systemic VTI:  0.15 m MV Vmean:      54.5 cm/s    Systemic Diam: 2.00 cm MV Decel Time: 200 msec MV E velocity: 105.55 cm/s Yvonne Kendall MD Electronically signed by Yvonne Kendall MD Signature Date/Time: 08/01/2022/2:54:36 PM    Final    DG Chest Port 1 View  Result Date: 08/01/2022 CLINICAL DATA:  Short of breath, breast cancer EXAM: PORTABLE CHEST 1 VIEW COMPARISON:  07/30/2022 FINDINGS: Single frontal view of the chest demonstrates continued enlargement of the cardiac silhouette. Stable ectasia of the thoracic aorta, accentuated by apical lordotic positioning. Stable areas of parenchymal scarring and subpleural fibrosis. No focal consolidation, effusion, or pneumothorax. No acute bony abnormalities. IMPRESSION: 1. Stable enlarged cardiac silhouette and thoracic aortic ectasia. 2. Bilateral parenchymal lung scarring and fibrosis. No acute airspace disease. Electronically Signed   By: Sharlet Salina M.D.   On: 08/01/2022 01:48   CT HEAD WO CONTRAST ( )  Result Date: 07/31/2022 CLINICAL DATA:  82 year old female with altered mental status. Bilateral pulmonary emboli. EXAM: CT HEAD WITHOUT CONTRAST TECHNIQUE: Contiguous axial images were obtained from the base of the skull through the vertex without intravenous contrast. RADIATION DOSE REDUCTION: This exam was performed according to the departmental dose-optimization program which  includes automated exposure control, adjustment of the mA and/or kV according to patient size and/or use of iterative reconstruction technique. COMPARISON:  Previous head CT 06/21/2013. FINDINGS: Brain: No midline shift, ventriculomegaly, mass effect, evidence of mass lesion, intracranial hemorrhage or evidence of cortically based acute infarction. Patchy mild for age white matter hypodensity has increased since 2015. Otherwise normal gray-white differentiation. Mild basal ganglia vascular calcifications. Vascular: No suspicious intracranial vascular hyperdensity. Mild for age Calcified atherosclerosis at the skull base. Skull: No acute osseous abnormality identified. Sinuses/Orbits: Mild right maxillary alveolar recess mucosal thickening appears to be in conjunction with carious or extracted right maxillary anterior molar. Other visualized paranasal sinuses and mastoids are clear. Other: No acute orbit or scalp soft tissue finding. IMPRESSION: 1. No acute intracranial abnormality. Mild for age white matter changes most commonly due to chronic small vessel disease. 2. Mild odontogenic right maxillary sinus disease. Electronically Signed   By: Odessa Fleming M.D.   On: 07/31/2022 09:55   CT Angio Chest PE W/Cm &/Or Wo Cm  Result Date: 07/30/2022 CLINICAL DATA:  High probability for PE.  Shortness of breath. EXAM: CT ANGIOGRAPHY CHEST WITH CONTRAST TECHNIQUE: Multidetector CT imaging of the chest was performed using the standard protocol during bolus administration of intravenous contrast. Multiplanar CT image reconstructions and MIPs were obtained to evaluate the vascular anatomy. RADIATION DOSE REDUCTION: This exam was performed according to the departmental dose-optimization program which includes automated exposure control, adjustment of the mA and/or kV according to patient size and/or use of iterative reconstruction technique. CONTRAST:  75mL OMNIPAQUE IOHEXOL 350 MG/ML SOLN COMPARISON:  CT abdomen and pelvis  06/23/2022. FINDINGS: Cardiovascular: There is adequate opacification of the pulmonary arteries. There are segmental right lower lobe and right middle lobe pulmonary emboli. There also segmental left lower lobe pulmonary emboli. Heart is normal in size. There is no pericardial effusion. Aorta is normal in size. There are atherosclerotic calcifications of the aorta. Mediastinum/Nodes: No enlarged mediastinal, hilar, or axillary lymph nodes. Thyroid gland, trachea, and esophagus demonstrate no significant findings. Lungs/Pleura: There are minimal scattered ground-glass and peripheral interstitial opacities throughout both lungs. There is no pleural effusion or pneumothorax. Trachea and central airways are patent. Upper Abdomen: No acute abnormality. Left renal cysts partially  imaged. Cholecystectomy clips are present. Musculoskeletal: Vertebroplasty changes are seen at T8. Review of the MIP images confirms the above findings. IMPRESSION: 1. Bilateral segmental pulmonary emboli involving the right middle lobe, right lower lobe and left lower lobe. Positive for acute PE with CTevidence of right heart strain (RV/LV Ratio = 1.1) consistent with at least submassive (intermediate risk) PE. The presence of right heart strain has been associated with an increased risk of morbidity and mortality. 2. Minimal scattered ground-glass and peripheral interstitial opacities throughout both lungs, likely infectious/inflammatory. Aortic Atherosclerosis (ICD10-I70.0). These results were called by telephone at the time of interpretation on 07/30/2022 at 11:16 pm to provider [facility] , who verbally acknowledged these results. Electronically Signed   By: Darliss Cheney M.D.   On: 07/30/2022 23:16   DG Chest Port 1 View  Result Date: 07/30/2022 CLINICAL DATA:  Shortness of breath.  Fever EXAM: PORTABLE CHEST 1 VIEW COMPARISON:  June 07, 2022 FINDINGS: No pneumothorax. No pneumothorax. The cardiomediastinal silhouette is stable.  Bilateral increased lung markings are largely stable. Mildly more focal opacity in the left mid lung. The retrocardiac region on the left is not well assessed due to lack of lateral imaging. No other abnormalities. IMPRESSION: 1. Focal opacity in left mid lung could represent developing infiltrate/pneumonia given history. Recommend short-term follow-up imaging to ensure resolution. 2. Increase interstitial markings bilaterally are stable. This could represent a chronic process given stability, recurrent pulmonary edema, or an atypical infectious process. Electronically Signed   By: Gerome Sam III M.D.   On: 07/30/2022 21:36    Microbiology: Results for orders placed or performed during the hospital encounter of 07/30/22  Culture, blood (routine x 2)     Status: None   Collection Time: 07/30/22 10:20 PM   Specimen: BLOOD  Result Value Ref Range Status   Specimen Description BLOOD BLOOD RIGHT ARM  Final   Special Requests   Final    BOTTLES DRAWN AEROBIC AND ANAEROBIC Blood Culture results may not be optimal due to an inadequate volume of blood received in culture bottles   Culture   Final    NO GROWTH 5 DAYS Performed at [facility], [address] [place], [place] [address]    Report Status 08/04/2022 FINAL  Final  Culture, blood (routine x 2)     Status: None   Collection Time: 07/30/22 10:21 PM   Specimen: BLOOD  Result Value Ref Range Status   Specimen Description BLOOD BLOOD LEFT ARM  Final   Special Requests   Final    BOTTLES DRAWN AEROBIC AND ANAEROBIC Blood Culture results may not be optimal due to an inadequate volume of blood received in culture bottles   Culture   Final    NO GROWTH 5 DAYS Performed at [facility], [address] [place], [place] [address]    Report Status 08/04/2022 FINAL  Final  MRSA Next Gen by PCR, Nasal     Status: None   Collection Time: 07/31/22 11:08 AM   Specimen: Nasal Mucosa; Nasal Swab  Result Value Ref Range Status    MRSA by PCR Next Gen NOT DETECTED NOT DETECTED Final    Comment: (NOTE) The GeneXpert MRSA Assay (FDA approved for NASAL specimens only), is one component of a comprehensive MRSA colonization surveillance program. It is not intended to diagnose MRSA infection nor to guide or monitor treatment for MRSA infections. Test performance is not FDA approved in patients less than 65 years old. Performed at [facility], [address] [place], [place] [address]     Labs: CBC: Recent Labs  Lab 08/01/22 [phone] 08/02/22 [phone] 08/03/22 0517 08/04/22 0637 08/05/22 0716  WBC 14.8* 13.0* 11.1* 10.3 11.1*  HGB 12.0 12.1 11.3* 10.8* 11.7*  HCT 36.3 36.5 34.1* 33.0* 34.9*  MCV 95.5 96.3 97.7 96.5 96.7  PLT 136* 167 186 199 247   Basic Metabolic Panel: Recent Labs  Lab 07/31/22 0815 07/31/22 1108 07/31/22 2315 08/01/22 0508 08/02/22 0647 08/03/22 0517 08/04/22 0637  NA  --    < > 135 138 139 140 140  K  --    < > 3.1* 3.1* 3.9 3.6 3.5  CL  --    < > 101 104 106 108 109  CO2  --    < > 26 27 24 23 23   GLUCOSE  --    < > 130* 119* 104* 88 94  BUN  --    < > 15 14 14 16 13   CREATININE  --    < > 1.35* 1.28* 1.19* 1.07* 1.08*  CALCIUM  --    < > 8.0* 7.9* 8.6* 8.3* 8.5*  MG 1.2*  --  2.1  --  1.8 1.6* 2.0  PHOS  --   --   --   --  3.5 3.5 3.1   < > = values in this interval not displayed.   Liver Function Tests: Recent Labs  Lab 07/30/22 2053  AST 34  ALT 15  ALKPHOS 54  BILITOT 1.5*  PROT 6.9  ALBUMIN 3.4*   CBG: Recent Labs  Lab 07/31/22 1814  GLUCAP 134*    Discharge time spent: greater than 30 minutes.  Signed: Lurene Shadow, MD [facility] 08/05/2022

## 2022-09-02 ENCOUNTER — Encounter: Payer: Self-pay | Admitting: Medical

## 2022-09-02 ENCOUNTER — Other Ambulatory Visit
Admission: RE | Admit: 2022-09-02 | Discharge: 2022-09-02 | Disposition: A | Discharge status: Home / Self Care | Payer: Medicare Other | Source: Ambulatory Visit | Attending: Medical | Admitting: Medical

## 2022-09-02 ENCOUNTER — Ambulatory Visit: Payer: Medicare Other | Attending: Medical | Admitting: Medical

## 2022-09-02 VITALS — BP 110/80 | HR 79 | Ht 62.0 in | Wt 274.0 lb

## 2022-09-02 DIAGNOSIS — I2699 Other pulmonary embolism without acute cor pulmonale: Secondary | ICD-10-CM | POA: Diagnosis not present

## 2022-09-02 DIAGNOSIS — I4891 Unspecified atrial fibrillation: Secondary | ICD-10-CM | POA: Diagnosis present

## 2022-09-02 DIAGNOSIS — N183 Chronic kidney disease, stage 3 unspecified: Secondary | ICD-10-CM

## 2022-09-02 DIAGNOSIS — E876 Hypokalemia: Secondary | ICD-10-CM

## 2022-09-02 DIAGNOSIS — I480 Paroxysmal atrial fibrillation: Secondary | ICD-10-CM

## 2022-09-02 DIAGNOSIS — R0602 Shortness of breath: Secondary | ICD-10-CM | POA: Insufficient documentation

## 2022-09-02 DIAGNOSIS — R7989 Other specified abnormal findings of blood chemistry: Secondary | ICD-10-CM

## 2022-09-02 LAB — CBC
HCT: 38.6 % (ref 36.0–46.0)
Hemoglobin: 12.6 g/dL (ref 12.0–15.0)
MCH: 31.6 pg (ref 26.0–34.0)
MCHC: 32.6 g/dL (ref 30.0–36.0)
MCV: 96.7 fL (ref 80.0–100.0)
Platelets: 159 10*3/uL (ref 150–400)
RBC: 3.99 MIL/uL (ref 3.87–5.11)
RDW: 13.3 % (ref 11.5–15.5)
WBC: 11.7 10*3/uL — ABNORMAL HIGH (ref 4.0–10.5)
nRBC: 0 % (ref 0.0–0.2)

## 2022-09-02 LAB — BASIC METABOLIC PANEL
Anion gap: 10 (ref 5–15)
BUN: 19 mg/dL (ref 8–23)
CO2: 28 mmol/L (ref 22–32)
Calcium: 8.6 mg/dL — ABNORMAL LOW (ref 8.9–10.3)
Chloride: 102 mmol/L (ref 98–111)
Creatinine, Ser: 1.32 mg/dL — ABNORMAL HIGH (ref 0.44–1.00)
GFR, Estimated: 41 mL/min — ABNORMAL LOW (ref >60)
Glucose, Bld: 92 mg/dL (ref 70–99)
Potassium: 3.5 mmol/L (ref 3.5–5.1)
Sodium: 140 mmol/L (ref 135–145)

## 2022-09-02 MED ORDER — AMIODARONE HCL 200 MG PO TABS: 200.0000 mg | ORAL_TABLET | Freq: Every day | ORAL | 0 refills | Status: DC

## 2022-09-02 NOTE — Patient Instructions (Addendum)
Medication Instructions:  DECREASE the Amiodarone to 200 mg once daily  *If you need a refill on your cardiac medications before your next appointment, please call your pharmacy*   Lab Work: Your provider would like for you to have following labs drawn: CBC and BMET.   Please go to the [facility] entrance and check in at the front desk.  You do not need an appointment.  They are open from 7am-6 pm.   If you have labs (blood work) drawn today and your tests are completely normal, you will receive your results only by: MyChart Message (if you have MyChart) OR A paper copy in the mail If you have any lab test that is abnormal or we need to change your treatment, we will call you to review the results.   Testing/Procedures: Your provider has ordered a Lexiscan Myoview Stress test. This will take place at [facility]. Please report to the [facility] medical mall entrance. The volunteers at the first desk will direct you where to go.  [facility] MYOVIEW  Your provider has ordered a Stress Test with nuclear imaging. The purpose of this test is to evaluate the blood supply to your heart muscle. This procedure is referred to as a "Non-Invasive Stress Test." This is because other than having an IV started in your vein, nothing is inserted or "invades" your body. Cardiac stress tests are done to find areas of poor blood flow to the heart by determining the extent of coronary artery disease (CAD). Some patients exercise on a treadmill, which naturally increases the blood flow to your heart, while others who are unable to walk on a treadmill due to physical limitations will have a pharmacologic/chemical stress agent called Lexiscan . This medicine will mimic walking on a treadmill by temporarily increasing your coronary blood flow.   Please note: these test may take anywhere between 2-4 hours to complete  How to prepare for your Myoview test:  Nothing to eat for 6 hours prior to the test No caffeine for 24 hours  prior to test No smoking 24 hours prior to test. Your medication may be taken with water.  If your doctor stopped a medication because of this test, do not take that medication. Hold the Furosemide the morning of test Ladies, please do not wear dresses.  Skirts or pants are appropriate. Please wear a short sleeve shirt. No perfume, cologne or lotion. Wear comfortable walking shoes. No heels!   PLEASE NOTIFY THE OFFICE AT LEAST 24 HOURS IN ADVANCE IF YOU ARE UNABLE TO KEEP YOUR APPOINTMENT.  [phone] AND  PLEASE NOTIFY NUCLEAR MEDICINE AT [facility] AT LEAST 24 HOURS IN ADVANCE IF YOU ARE UNABLE TO KEEP YOUR APPOINTMENT. [phone]    Follow-Up: At [facility], you and your health needs are our priority.  As part of our continuing mission to provide you with exceptional heart care, we have created designated Provider Care Teams.  These Care Teams include your primary Cardiologist (physician) and Advanced Practice Providers (APPs -  Physician Assistants and Nurse Practitioners) who all work together to provide you with the care you need, when you need it.  We recommend signing up for the patient portal called "MyChart".  Sign up information is provided on this After Visit Summary.  MyChart is used to connect with patients for Virtual Visits (Telemedicine).  Patients are able to view lab/test results, encounter notes, upcoming appointments, etc.  Non-urgent messages can be sent to your provider as well.   To learn  more about what you can do with MyChart, go to [email or web address].    Your next appointment:   2 month(s)  Provider:   You may see Lorine Bears, MD or one of the following Advanced Practice Providers on your designated Care Team:   Nicolasa Ducking, NP Eula Listen, PA-C Cadence Fransico Michael, PA-C Charlsie Quest, NP

## 2022-09-02 NOTE — Progress Notes (Signed)
Cardiology Office Note:    Date:  09/02/2022   ID:  Maria Gutierrez, DOB [birth year], MRN [id number]  PCP:  Jerl Mina, MD  [facility] Cardiologist:  Lorine Bears, MD  [facility] Electrophysiologist:  None   Referring MD: Jerl Mina, MD   Chief Complaint: Hospital follow-up  History of Present Illness:    Maria Gutierrez is a 82 y.o. female with a hx of HTN, obesity, depression and anxiety, CKD stage 3, breast cancer s/p radiation and chemotherapy, and gout who present for hospital follow-up.   The patient was recently admitted 6/2 for acute PE, severe sepsis 2/2 PNA, acute respiratiry failure, AKI on CKD stage 3, and new onset Afib. She presented with SOB fount to have bilateral PE started on IV heparin. She was in sinus tach/MAT, and later on developed Afib. Patient underwent mechanical thrombectomy by [facility] on 6/4. Afib rates were uncontrolled with just metoprolol and she was started on amiodarone. HS troponin was elevated 24>25.  Echo showed LVEF 60-65%, mild to mod hypertrophjy, normal RVSF, mildly elevated pulmonary artery pressure, trivial MR, ascending aorta measuring 36mm. She was transitioned to oral amiodarone and started on Eliquis 5mg  BID.   Today, the patient reports she is doing better. She felt weak and tired the first two weeks out of the hospital, but is doing better now. She is NSR today. She denies any palpitations or heart skipping a beat. She cannot take the potassium pills she was given. Also cannot tolerate the packet or liquid version. Has not followed up with vascular surgery. Denies bleeding issues with Eliquis. She is taking Lopressor 100mg  BID.   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 extensive intermediate  grade DCIS.    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   DDD (degenerative disc disease), lumbar    Dyspnea    GERD (gastroesophageal reflux disease)    Heart murmur    History of blood transfusion    Hyperlipidemia    Hypertension    Personal history of chemotherapy 2016   right breast ca   Personal history of radiation therapy 2016   mammosite   SOB (shortness of breath) 12/03/2014    Past Surgical History:  Procedure Laterality Date   ABDOMINAL HYSTERECTOMY     AXILLARY LYMPH NODE BIOPSY Right 07/14/2014   Procedure: AXILLARY LYMPH NODE BIOPSY;  Surgeon: Earline Mayotte, MD;  Location: [facility] ORS;  Service: General;  Laterality: Right;   BACK SURGERY  12/29/2020   BOTOX INJECTION N/A 01/13/2020   Procedure: BOTOX INJECTION;  Surgeon: Vanna Scotland, MD;  Location: [facility] ORS;  Service: Urology;  Laterality: N/A;   BREAST BIOPSY Left 2012   benign   BREAST BIOPSY Right 05/26/2014   DCIS    BREAST EXCISIONAL BIOPSY Right 06/28/2017    ULCERATED SKIN WITH UNDERLYING FAT NECROSIS, ACUTE INFLAMMATION AND REACTIVE EPITHELIAL ATYPIA   BREAST LUMPECTOMY Right 06/10/2014   DCIS and Invasive ductal carcinoma, clear margins   BREAST SURGERY Right 06/10/2014   Wide excision for intermediate grade DCIS, identification of a 10  millimeter area of invasive mammary carcinoma.   CHOLECYSTECTOMY  2012   COLONOSCOPY  2009   EYE  SURGERY Bilateral 2014   cataract   IR KYPHO EA ADDL LEVEL THORACIC OR LUMBAR  02/12/2021   JOINT REPLACEMENT Left 2001   knee   JOINT REPLACEMENT Right 2003   knee   JOINT REPLACEMENT Left 2004   replacement joint broken   kneee surgery Left    PORT-A-CATH REMOVAL  02/11/2016   Dr Lemar Livings   PORTACATH PLACEMENT Left 08/27/2014   Procedure: INSERTION PORT-A-CATH;  Surgeon: Earline Mayotte, MD;  Location: [facility] ORS;  Service: General;  Laterality: Left;   PULMONARY THROMBECTOMY Bilateral 08/02/2022   Procedure: PULMONARY THROMBECTOMY;   Surgeon: Renford Dills, MD;  Location: [facility] INVASIVE CV LAB;  Service: Cardiovascular;  Laterality: Bilateral;    Current Medications: Current Meds  Medication Sig   acetaminophen (TYLENOL) 325 MG tablet Take 2 tablets (650 mg total) by mouth every 6 (six) hours as needed for mild pain or fever.   apixaban (ELIQUIS) 5 MG TABS tablet Take 1 tablet (5 mg total) by mouth 2 (two) times daily.   calcium carbonate (OS-CAL) 600 MG TABS tablet Take 600 mg by mouth daily.   cyanocobalamin 1000 MCG tablet Take 1,000 mcg by mouth daily.   cyclobenzaprine (FLEXERIL) 10 MG tablet 1/2-1 po qHS prn   furosemide (LASIX) 20 MG tablet Take 1 tablet (20 mg total) by mouth daily.   metoprolol tartrate (LOPRESSOR) 100 MG tablet Take 1 tablet (100 mg total) by mouth 2 (two) times daily.   Multiple Vitamin (MULTIVITAMIN WITH MINERALS) TABS tablet Take 1 tablet by mouth daily.   pantoprazole (PROTONIX) 40 MG tablet Take 40 mg by mouth daily.   potassium chloride SA (KLOR-CON M) 20 MEQ tablet Take 1 tablet (20 mEq total) by mouth daily.   simvastatin (ZOCOR) 40 MG tablet Take 40 mg by mouth at bedtime.    tamoxifen (NOLVADEX) 20 MG tablet TAKE 1 TABLET BY MOUTH DAILY   venlafaxine XR (EFFEXOR-XR) 75 MG 24 hr capsule TAKE 1 CAPSULE BY MOUTH DAILY  WITH BREAKFAST   Vitamin D, Ergocalciferol, (DRISDOL) 1.25 MG (50000 UNIT) CAPS capsule TAKE 1 CAPSULE BY MOUTH EVERY 7  DAYS TAKE ON MONDAYS   [DISCONTINUED] amiodarone (PACERONE) 400 MG tablet Take 1 tablet (400 mg total) by mouth 2 (two) times daily for 7 days, THEN 0.5 tablets (200 mg total) 2 (two) times daily for 23 days.   [DISCONTINUED] metoprolol succinate (TOPROL-XL) 50 MG 24 hr tablet Take 50 mg by mouth every morning. Take with or immediately following a meal.     Allergies:   Patient has no known allergies.   Social History   Socioeconomic History   Marital status: Married    Spouse name: Alinda Money   Number of children: 1   Years of education: Not on  file   Highest education level: Not on file  Occupational History   Occupation: house cleaner    Comment: retired  Tobacco Use   Smoking status: Never   Smokeless tobacco: Never  Substance and Sexual Activity   Alcohol use: No    Alcohol/week: 0.0 standard drinks of alcohol   Drug use: No   Sexual activity: Not Currently  Other Topics Concern   Not on file  Social History Narrative   Patient lives with husband and feels safe in her home.   Remains active, drives and is self sufficient.   Social Determinants of Health   Financial Resource Strain: Not on file  Food Insecurity: No Food Insecurity (07/31/2022)   Hunger Vital Sign  Worried About Programme researcher, broadcasting/film/video in the Last Year: Never true    Ran Out of Food in the Last Year: Never true  Transportation Needs: No Transportation Needs (07/31/2022)   PRAPARE - Administrator, Civil Service (Medical): No    Lack of Transportation (Non-Medical): No  Physical Activity: Not on file  Stress: Not on file  Social Connections: Not on file     Family History: The patient's family history includes Heart disease in her sister. There is no history of Breast cancer.  ROS:   Please see the history of present illness.     All other systems reviewed and are negative.  EKGs/Labs/Other Studies Reviewed:    The following studies were reviewed today:  Echo 07/2022 1. Left ventricular ejection fraction, by estimation, is 60 to 65%. The  left ventricle has normal function. Left ventricular endocardial border  not optimally defined to evaluate regional wall motion. mild to moderate  left ventricular hypertrophy. Left  ventricular diastolic function could not be evaluated.   2. Right ventricular systolic function is normal. The right ventricular  size is mildly enlarged. There is mildly elevated pulmonary artery  systolic pressure.   3. The mitral valve is grossly normal. Trivial mitral valve  regurgitation. No evidence of mitral  stenosis.   4. The aortic valve is tricuspid. Aortic valve regurgitation is not  visualized. No aortic stenosis is present.   5. There is borderline dilatation of the ascending aorta, measuring 36  mm.   EKG:  EKG is ordered today.  The ekg ordered today demonstrates NSR 79bpm, 1st degree AV block, LAD, nonspecific T wave changes  Recent Labs: 07/30/2022: ALT 15; B Natriuretic Peptide 158.9 07/31/2022: TSH 5.070 08/04/2022: Magnesium 2.0 09/02/2022: BUN 19; Creatinine, Ser 1.32; Hemoglobin 12.6; Platelets 159; Potassium 3.5; Sodium 140  Recent Lipid Panel No results found for: "CHOL", "TRIG", "HDL", "CHOLHDL", "VLDL", "LDLCALC", "LDLDIRECT"   Physical Exam:    VS:  BP 110/80 (BP Location: Left Arm, Patient Position: Sitting, Cuff Size: Large)   Pulse 79   Ht 5\' 2"  (1.575 m)   Wt 274 lb (124.3 kg)   SpO2 94%   BMI 50.12 kg/m     Wt Readings from Last 3 Encounters:  09/02/22 274 lb (124.3 kg)  08/04/22 235 lb 10.8 oz (106.9 kg)  06/07/22 250 lb (113.4 kg)     GEN:  Well nourished, well developed in no acute distress HEENT: Normal NECK: No JVD; No carotid bruits LYMPHATICS: No lymphadenopathy CARDIAC: RRR, no murmurs, rubs, gallops RESPIRATORY:  Clear to auscultation without rales, wheezing or rhonchi  ABDOMEN: Soft, non-tender, non-distended MUSCULOSKELETAL:  No edema; No deformity  SKIN: Warm and dry NEUROLOGIC:  Alert and oriented x 3 PSYCHIATRIC:  Normal affect   ASSESSMENT:    1. Paroxysmal atrial fibrillation (HCC)   2. Bilateral pulmonary embolism (HCC)   3. SOB (shortness of breath)   4. Stage 3 chronic kidney disease, unspecified whether stage 3a or 3b CKD (HCC)   5. Elevated troponin   6. Hypokalemia    PLAN:    In order of problems listed above:  Atrial fibrillation Patient is in NSR today on EKG, heart rate 79bpm. She denies bleeding issues with Eliquis. Continue Eliquis 5mg  BID. CBC today. Continue Lopressor 100mg  BID for rate control. She is taking  amiodarone 200mg  BID, I will decrease this ti 200mg  daily.   Bilateral pulmonary embolism S/p mechanical thrombectomy to bilateral lobar pulmonary arteries. She is on  Eliquis 5mg  BID. They will call to follow-up with vascular surgery.   CKD Baseline around 1.2, most recent check showed BMET 1.8. The patient is on lasix daily. Re-check BMET today.  Elevated troponin HS trop mildly elevated in the hospital, suspect supply demand mismatch. No chest pain reported. She does have DOE that is improving. Given CKD I will order a Myoview lexiscan.   Hypokalemia 3.2 on last re-check. She is unable to take klor-con, packets or liquid as they make her sick. She is requesting Micro-K id needed. Re-check BMET today.   Disposition: Follow up in 2 month(s) with MD/APP   Shared Decision Making/Informed Consent   Informed Consent   Shared Decision Making/Informed Consent The risks [chest pain, shortness of breath, cardiac arrhythmias, dizziness, blood pressure fluctuations, myocardial infarction, stroke/transient ischemic attack, nausea, vomiting, allergic reaction, radiation exposure, metallic taste sensation and life-threatening complications (estimated to be 1 in 10,000)], benefits (risk stratification, diagnosing coronary artery disease, treatment guidance) and alternatives of a nuclear stress test were discussed in detail with Ms. Kattner and she agrees to proceed.       Signed, Torben Soloway David Stall, PA-C  09/02/2022 4:28 PM    [facility]

## 2022-09-05 ENCOUNTER — Other Ambulatory Visit: Payer: Self-pay | Admitting: *Deleted

## 2022-09-05 ENCOUNTER — Telehealth: Payer: Self-pay | Admitting: Medical

## 2022-09-05 ENCOUNTER — Other Ambulatory Visit: Payer: Self-pay

## 2022-09-05 DIAGNOSIS — I4891 Unspecified atrial fibrillation: Secondary | ICD-10-CM

## 2022-09-05 MED ORDER — APIXABAN 5 MG PO TABS
5.0000 mg | ORAL_TABLET | Freq: Two times a day (BID) | ORAL | 5 refills | Status: DC
Start: 2022-09-05 — End: 2022-12-12

## 2022-09-05 NOTE — Telephone Encounter (Signed)
Refill request

## 2022-09-05 NOTE — Telephone Encounter (Signed)
Eliquis 5mg  refill request received. Patient is 82 years old, weight-124.3kg, Crea-1.32 on 09/02/22, Diagnosis-Afib, and last seen by Cadence Furth on 09/02/22. Dose is appropriate based on dosing criteria. Will send in refill to requested pharmacy.

## 2022-09-05 NOTE — Telephone Encounter (Signed)
*  STAT* If patient is at the pharmacy, call can be transferred to refill team.   1. Which medications need to be refilled? (please list name of each medication and dose if known)   apixaban (ELIQUIS) 5 MG TABS tablet    2. Which pharmacy/location (including street and city if local pharmacy) is medication to be sent to? [facility] [address] [place] - [address]   3. Do they need a 30 day or 90 day supply? 30 day  Patient is completely out

## 2022-09-05 NOTE — Telephone Encounter (Signed)
Eliquis 5mg  refill request received. Patient is 82 years old, weight-124.3kg, Crea-1.32 on 09/02/22, Diagnosis-Afib, and last seen by Cadence Furth on 09/02/22. Dose is appropriate based on dosing criteria.   Refill sent via refill encounter.

## 2022-09-07 ENCOUNTER — Other Ambulatory Visit: Payer: Self-pay | Admitting: Internal Medicine

## 2022-09-07 DIAGNOSIS — Z1231 Encounter for screening mammogram for malignant neoplasm of breast: Secondary | ICD-10-CM

## 2022-09-15 ENCOUNTER — Encounter
Admission: RE | Admit: 2022-09-15 | Discharge: 2022-09-15 | Disposition: A | Discharge status: Home / Self Care | Payer: Medicare Other | Source: Ambulatory Visit | Attending: Medical | Admitting: Medical

## 2022-09-15 DIAGNOSIS — R7989 Other specified abnormal findings of blood chemistry: Secondary | ICD-10-CM | POA: Insufficient documentation

## 2022-09-15 DIAGNOSIS — I480 Paroxysmal atrial fibrillation: Secondary | ICD-10-CM | POA: Insufficient documentation

## 2022-09-15 LAB — NM MYOCAR MULTI W/SPECT W/WALL MOTION / EF
LV dias vol: 52 mL (ref 46–106)
LV sys vol: 17 mL
Nuc Stress EF: 67 %
Peak HR: 72 {beats}/min
Rest HR: 60 {beats}/min
Rest Nuclear Isotope Dose: 10.3 mCi
SDS: 0
SRS: 1
SSS: 2
ST Depression (mm): 0 mm
Stress Nuclear Isotope Dose: 28.9 mCi
TID: 1

## 2022-09-15 MED ORDER — REGADENOSON 0.4 MG/5ML IV SOLN
0.4000 mg | Freq: Once | INTRAVENOUS | Status: AC
  Administered 2022-09-15: 0.4 mg via INTRAVENOUS

## 2022-09-15 MED ORDER — TECHNETIUM TC 99M TETROFOSMIN IV KIT
30.0000 | PACK | Freq: Once | INTRAVENOUS | Status: AC
  Administered 2022-09-15: 28.89 via INTRAVENOUS

## 2022-09-15 MED ORDER — REGADENOSON 0.4 MG/5ML IV SOLN: 0.4000 mg | Freq: Once | INTRAVENOUS | Status: DC

## 2022-09-15 MED ORDER — TECHNETIUM TC 99M TETROFOSMIN IV KIT
10.0000 | PACK | Freq: Once | INTRAVENOUS | Status: AC | PRN
  Administered 2022-09-15: 10.33 via INTRAVENOUS

## 2022-09-19 ENCOUNTER — Encounter (INDEPENDENT_AMBULATORY_CARE_PROVIDER_SITE_OTHER): Payer: Self-pay | Admitting: Vascular Surgery

## 2022-09-19 ENCOUNTER — Ambulatory Visit (INDEPENDENT_AMBULATORY_CARE_PROVIDER_SITE_OTHER): Payer: Medicare Other | Admitting: Vascular Surgery

## 2022-09-19 ENCOUNTER — Ambulatory Visit: Payer: Medicare Other | Admitting: Internal Medicine

## 2022-09-19 VITALS — BP 130/72 | HR 68 | Resp 18 | Wt 274.0 lb

## 2022-09-19 DIAGNOSIS — I2694 Multiple subsegmental pulmonary emboli without acute cor pulmonale: Secondary | ICD-10-CM

## 2022-09-19 DIAGNOSIS — E785 Hyperlipidemia, unspecified: Secondary | ICD-10-CM | POA: Diagnosis not present

## 2022-09-19 DIAGNOSIS — I10 Essential (primary) hypertension: Secondary | ICD-10-CM | POA: Diagnosis not present

## 2022-09-19 DIAGNOSIS — I4891 Unspecified atrial fibrillation: Secondary | ICD-10-CM

## 2022-09-19 NOTE — Progress Notes (Unsigned)
MRN : [id number]  Maria Gutierrez is a 82 y.o. (01-22-1941) female who presents with chief complaint of legs hurt and swell.  History of Present Illness:  The patient presents to the office for evaluation of PE.  PE was identified at [facility] by CT angiogram on 07/30/2022.  The initial symptoms were SOB and chest pain.  Procedure 08/02/2022:  Mechanical thrombectomy bilateral lobar pulmonary arteries for removal of pulmonary emboli using the Penumbra CAT 8 thrombectomy catheter.   She continues to be severely SOB and is having difficulty performing her daily activities at home.  She denies pleuritic chest pains.  She continues to have a persistent cough but no hemoptysis.  No blood per rectum or blood in any sputum.  No excessive bruising per the patient.   The patient has not been using compression therapy at this point.  No history of rest pain symptoms. No new ulcers or wounds of the lower extremities have occurred.  The patient denies amaurosis fugax or recent TIA symptoms. There are no recent neurological changes noted. No recent episodes of angina or shortness of breath documented.   CT angiogram obtained July 30, 2022 demonstrates bilateral PE   No outpatient medications have been marked as taking for the 09/19/22 encounter (Appointment) with Gilda Crease, Latina Craver, MD.    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 extensive intermediate grade DCIS.    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   DDD (degenerative disc disease), lumbar    Dyspnea    GERD (gastroesophageal reflux disease)    Heart murmur    History of blood transfusion    Hyperlipidemia    Hypertension    Personal history of chemotherapy 2016    right breast ca   Personal history of radiation therapy 2016   mammosite   SOB (shortness of breath) 12/03/2014    Past Surgical History:  Procedure Laterality Date   ABDOMINAL HYSTERECTOMY     AXILLARY LYMPH NODE BIOPSY Right 07/14/2014   Procedure: AXILLARY LYMPH NODE BIOPSY;  Surgeon: Earline Mayotte, MD;  Location: [facility] ORS;  Service: General;  Laterality: Right;   BACK SURGERY  12/29/2020   BOTOX INJECTION N/A 01/13/2020   Procedure: BOTOX INJECTION;  Surgeon: Vanna Scotland, MD;  Location: [facility] ORS;  Service: Urology;  Laterality: N/A;   BREAST BIOPSY Left 2012   benign   BREAST BIOPSY Right 05/26/2014   DCIS    BREAST EXCISIONAL BIOPSY Right 06/28/2017    ULCERATED SKIN WITH UNDERLYING FAT NECROSIS, ACUTE INFLAMMATION AND REACTIVE EPITHELIAL ATYPIA   BREAST LUMPECTOMY Right 06/10/2014   DCIS and Invasive ductal carcinoma, clear margins   BREAST SURGERY Right 06/10/2014   Wide excision for intermediate grade DCIS, identification of a 10  millimeter area of invasive mammary carcinoma.   CHOLECYSTECTOMY  2012   COLONOSCOPY  2009   EYE SURGERY Bilateral 2014   cataract   IR KYPHO EA ADDL LEVEL THORACIC OR LUMBAR  02/12/2021   JOINT REPLACEMENT Left 2001   knee   JOINT REPLACEMENT Right 2003   knee   JOINT REPLACEMENT Left 2004   replacement joint broken   kneee  surgery Left    PORT-A-CATH REMOVAL  02/11/2016   Dr Lemar Livings   PORTACATH PLACEMENT Left 08/27/2014   Procedure: INSERTION PORT-A-CATH;  Surgeon: Earline Mayotte, MD;  Location: [facility] ORS;  Service: General;  Laterality: Left;   PULMONARY THROMBECTOMY Bilateral 08/02/2022   Procedure: PULMONARY THROMBECTOMY;  Surgeon: Renford Dills, MD;  Location: [facility] INVASIVE CV LAB;  Service: Cardiovascular;  Laterality: Bilateral;    Social History Social History   Tobacco Use   Smoking status: Never   Smokeless tobacco: Never  Substance Use Topics   Alcohol use: No    Alcohol/week: 0.0 standard drinks of  alcohol   Drug use: No    Family History Family History  Problem Relation Age of Onset   Heart disease Sister    Breast cancer Neg Hx     No Known Allergies   REVIEW OF SYSTEMS (Negative unless checked)  Constitutional: [] Weight loss  [] Fever  [] Chills Cardiac: [] Chest pain   [] Chest pressure   [] Palpitations   [] Shortness of breath when laying flat   [] Shortness of breath with exertion. Vascular:  [] Pain in legs with walking   [x] Pain in legs at rest  [] History of DVT   [] Phlebitis   [x] Swelling in legs   [] Varicose veins   [] Non-healing ulcers Pulmonary:   [] Uses home oxygen   [] Productive cough   [] Hemoptysis   [] Wheeze  [] COPD   [] Asthma Neurologic:  [] Dizziness   [] Seizures   [] History of stroke   [] History of TIA  [] Aphasia   [] Vissual changes   [] Weakness or numbness in arm   [] Weakness or numbness in leg Musculoskeletal:   [] Joint swelling   [] Joint pain   [] Low back pain Hematologic:  [] Easy bruising  [] Easy bleeding   [] Hypercoagulable state   [] Anemic Gastrointestinal:  [] Diarrhea   [] Vomiting  [] Gastroesophageal reflux/heartburn   [] Difficulty swallowing. Genitourinary:  [] Chronic kidney disease   [] Difficult urination  [] Frequent urination   [] Blood in urine Skin:  [] Rashes   [] Ulcers  Psychological:  [] History of anxiety   []  History of major depression.  Physical Examination  There were no vitals filed for this visit. There is no height or weight on file to calculate BMI. Gen: WD/WN, NAD Head: [place]/AT, No temporalis wasting.  Ear/Nose/Throat: Hearing grossly intact, nares w/o erythema or drainage, pinna without lesions Eyes: PER, EOMI, sclera nonicteric.  Neck: Supple, no gross masses.  No JVD.  Pulmonary:  Good air movement, no audible wheezing, no use of accessory muscles.  Cardiac: RRR, precordium not hyperdynamic. Vascular:  scattered varicosities present bilaterally.  Moderate venous stasis changes to the legs bilaterally.  2+ soft pitting edema. CEAP  C4sEpAsPr   Vessel Right Left  Radial Palpable Palpable  Gastrointestinal: soft, non-distended. No guarding/no peritoneal signs.  Musculoskeletal: M/S 5/5 throughout.  No deformity.  Neurologic: CN 2-12 intact. Pain and light touch intact in extremities.  Symmetrical.  Speech is fluent. Motor exam as listed above. Psychiatric: Judgment intact, Mood & affect appropriate for pt's clinical situation. Dermatologic: Venous rashes no ulcers noted.  No changes consistent with cellulitis. Lymph : No lichenification or skin changes of chronic lymphedema.  CBC Lab Results  Component Value Date   WBC 11.7 (H) 09/02/2022   HGB 12.6 09/02/2022   HCT 38.6 09/02/2022   MCV 96.7 09/02/2022   PLT 159 09/02/2022    BMET    Component Value Date/Time   NA 140 09/02/2022 1600   NA 141 06/05/2014 0843   K 3.5 09/02/2022 1600  K 3.5 06/05/2014 0843   CL 102 09/02/2022 1600   CL 105 06/05/2014 0843   CO2 28 09/02/2022 1600   CO2 27 06/05/2014 0843   GLUCOSE 92 09/02/2022 1600   GLUCOSE 94 06/05/2014 0843   BUN 19 09/02/2022 1600   BUN 22 (H) 06/05/2014 0843   CREATININE 1.32 (H) 09/02/2022 1600   CREATININE 1.15 (H) 02/05/2021 1018   CALCIUM 8.6 (L) 09/02/2022 1600   CALCIUM 9.2 06/05/2014 0843   GFRNONAA 41 (L) 09/02/2022 1600   GFRNONAA 58 (L) 06/05/2014 0843   GFRAA 44 (L) 08/05/2019 1315   GFRAA >60 06/05/2014 0843   CrCl cannot be calculated (Unknown ideal weight.).  COAG Lab Results  Component Value Date   INR 1.1 07/30/2022   INR 1.1 06/07/2022   INR 0.9 11/21/2008    Radiology NM Myocar Multi W/Spect W/Wall Motion / EF  Result Date: 09/15/2022   Abnormal pharmacologic myocardial perfusion stress test.   There is a small in size, mild in severity, fixed mid inferolateral and apical lateral defect that may represent subtle scar and cannot rule out artifact.   No significant ischemia is identified.   Left ventricular systolic function is normal (LVEF > 65%).   Coronary artery  calcification and aortic atherosclerosis are noted on the attenuation correction CT.   This is a low-risk study.     Assessment/Plan 1. Multiple subsegmental pulmonary emboli without acute cor pulmonale (HCC) This does seem somewhat unusual that she would continue to have such disability status post her thrombectomy.  Given her persistent symptoms I will obtain a follow-up CT angiogram to assess residual thrombus burden.  She will follow-up with me in the office after the study.  - CT Angio Chest Pulmonary Embolism (PE) W or WO Contrast; Future  2. Atrial fibrillation with rapid ventricular response (HCC) Continue antiarrhythmia medications as already ordered, these medications have been reviewed and there are no changes at this time.  Continue anticoagulation as ordered by Cardiology Service  3. Primary hypertension Continue antihypertensive medications as already ordered, these medications have been reviewed and there are no changes at this time.  4. Hyperlipidemia, unspecified hyperlipidemia type Continue statin as ordered and reviewed, no changes at this time    Levora Dredge, MD  09/19/2022 1:13 PM

## 2022-09-20 ENCOUNTER — Telehealth (INDEPENDENT_AMBULATORY_CARE_PROVIDER_SITE_OTHER): Payer: Self-pay

## 2022-09-20 ENCOUNTER — Encounter (INDEPENDENT_AMBULATORY_CARE_PROVIDER_SITE_OTHER): Payer: Self-pay | Admitting: Vascular Surgery

## 2022-09-20 NOTE — Telephone Encounter (Signed)
Spoke with pt about her CT and she states she will schedule it

## 2022-09-21 ENCOUNTER — Telehealth: Payer: Self-pay | Admitting: Cardiovascular Disease

## 2022-09-21 NOTE — Telephone Encounter (Signed)
The patient has been notified of the result along with recommendations. Pt verbalized understanding. All questions (if any) were answered     Cadence David Stall, PA-C 09/16/2022 10:41 AM EDT     Abnormal stress test, small defect that may be subtle scar, normal pump function. Coronary artery calcification and aortic calcifications are noted. Overall low risk.

## 2022-09-21 NOTE — Telephone Encounter (Signed)
Patient was returning call, for results. Please advise

## 2022-09-23 ENCOUNTER — Ambulatory Visit
Admission: RE | Admit: 2022-09-23 | Discharge: 2022-09-23 | Disposition: A | Discharge status: Home / Self Care | Payer: Medicare Other | Source: Ambulatory Visit | Attending: Vascular Surgery | Admitting: Vascular Surgery

## 2022-09-23 DIAGNOSIS — I2694 Multiple subsegmental pulmonary emboli without acute cor pulmonale: Secondary | ICD-10-CM | POA: Insufficient documentation

## 2022-09-23 MED ORDER — IOHEXOL 350 MG/ML SOLN
75.0000 mL | Freq: Once | INTRAVENOUS | Status: AC | PRN
  Administered 2022-09-23: 60 mL via INTRAVENOUS

## 2022-09-26 ENCOUNTER — Telehealth (INDEPENDENT_AMBULATORY_CARE_PROVIDER_SITE_OTHER): Payer: Self-pay | Admitting: Vascular Surgery

## 2022-09-26 NOTE — Telephone Encounter (Signed)
Less than 48 I contacted the patient by phone.  Regarding the CT scan which is resulted dated since then 09/23/2022.   CT scan shows complete resolution of the previous pulmonary emboli.  There is no active pulmonary process such as effusions or pneumonias.  Patient states she actually is feeling much better compared to when she was in the office.  I have asked that she follow-up for further review of the scan with Dr. Burnett Sheng her PCP.  She will follow-up with me as arranged.

## 2022-10-02 ENCOUNTER — Other Ambulatory Visit: Payer: Self-pay | Admitting: Cardiovascular Disease

## 2022-10-10 ENCOUNTER — Telehealth: Payer: Self-pay | Admitting: Cardiovascular Disease

## 2022-10-10 NOTE — Telephone Encounter (Signed)
*  STAT* If patient is at the pharmacy, call can be transferred to refill team.   1. Which medications need to be refilled? (please list name of each medication and dose if known) metoprolol tartrate (LOPRESSOR) 100 MG tablet    2. Would you like to learn more about the convenience, safety, & potential cost savings by using the [facility]?  3. Are you open to using the [facility] (Type [facility].  ).   4. Which pharmacy/location (including street and city if local pharmacy) is medication to be sent to? [facility] [address], [place] - [address]    5. Do they need a 30 day or 90 day supply? 90 day

## 2022-10-10 NOTE — Telephone Encounter (Signed)
Hi,   Please advise if ok to fill under Dr. Kirke Corin as the prescribing physician. Meds was written by Lurene Shadow, MD, while the patient was in the hospital. Patient has seen Dr. Kirke Corin in the hospital and has also seen Cadence in clinic. Thank you so much.

## 2022-10-11 NOTE — Telephone Encounter (Signed)
Pt calling for an update on this medication. Informed her message was sent this morning to provider, we will call once we hear back

## 2022-10-12 MED ORDER — METOPROLOL TARTRATE 100 MG PO TABS: 100.0000 mg | ORAL_TABLET | Freq: Two times a day (BID) | ORAL | 1 refills | Status: DC

## 2022-10-12 NOTE — Addendum Note (Signed)
Addended by: Auburn Bilberry D on: 10/12/2022 04:33 PM   Modules accepted: Orders

## 2022-10-12 NOTE — Telephone Encounter (Signed)
Refills sent

## 2022-10-12 NOTE — Telephone Encounter (Signed)
Pt is calling back about her refill again. Please advise. Pt is completely out.

## 2022-10-18 ENCOUNTER — Ambulatory Visit: Payer: Medicare Other | Admitting: Internal Medicine

## 2022-10-21 ENCOUNTER — Ambulatory Visit
Admission: RE | Admit: 2022-10-21 | Discharge: 2022-10-21 | Disposition: A | Discharge status: Home / Self Care | Payer: Medicare Other | Source: Ambulatory Visit | Attending: Internal Medicine | Admitting: Internal Medicine

## 2022-10-21 DIAGNOSIS — Z1231 Encounter for screening mammogram for malignant neoplasm of breast: Secondary | ICD-10-CM | POA: Diagnosis present

## 2022-11-18 ENCOUNTER — Ambulatory Visit: Payer: Medicare Other | Attending: Medical | Admitting: Medical

## 2022-11-18 ENCOUNTER — Encounter: Payer: Self-pay | Admitting: Medical

## 2022-11-18 VITALS — BP 124/70 | HR 58 | Ht 63.0 in | Wt 275.3 lb

## 2022-11-18 DIAGNOSIS — I2699 Other pulmonary embolism without acute cor pulmonale: Secondary | ICD-10-CM | POA: Diagnosis not present

## 2022-11-18 DIAGNOSIS — N183 Chronic kidney disease, stage 3 unspecified: Secondary | ICD-10-CM

## 2022-11-18 DIAGNOSIS — I480 Paroxysmal atrial fibrillation: Secondary | ICD-10-CM

## 2022-11-18 DIAGNOSIS — R7989 Other specified abnormal findings of blood chemistry: Secondary | ICD-10-CM | POA: Diagnosis not present

## 2022-11-18 NOTE — Progress Notes (Signed)
Cardiology Office Note:    Date:  11/18/2022   ID:  Maria Gutierrez, DOB 1940-09-28, MRN [id number]  PCP:  No primary care provider on file.  [facility] Cardiologist:  Yvonne Kendall, MD  [facility] Electrophysiologist:  None   Referring MD: Jerl Mina, MD   Chief Complaint: 2 month follow-up  History of Present Illness:    Maria Gutierrez is a 82 y.o. female with a hx of  HTN, obesity, depression and anxiety, CKD stage 3, breast cancer s/p radiation and chemotherapy, and gout who present for 2 month follow-up.    The patient was admitted 6/2 for acute PE, severe sepsis 2/2 PNA, acute respiratiry failure, AKI on CKD stage 3, and new onset Afib. She presented with SOB fount to have bilateral PE started on IV heparin. She was in sinus tach/MAT, and later on developed Afib. Patient underwent mechanical thrombectomy by [facility] on 6/4. Afib rates were uncontrolled with just metoprolol and she was started on amiodarone. HS troponin was elevated 24>25.  Echo showed LVEF 60-65%, mild to mod hypertrophjy, normal RVSF, mildly elevated pulmonary artery pressure, trivial MR, ascending aorta measuring 36mm. She was transitioned to oral amiodarone and started on Eliquis 5mg  BID.   Patient was last seen August 30, 2022 is overall doing better.  She was feeling tired being out of the hospital.  She was in normal sinus rhythm.  Due to mildly elevated troponin in the hospital a Myoview Lexiscan was ordered.  This was low risk normal from logical myocardial perfusion test with a small in size, mild in severity, fixed mid inferolateral and apical lateral defect that may represent subtle scarring cannot rule out artifact.  No ischemia noted.  EF 65%.  30 calcification and aortic atherosclerosis were noted on attenuation correction CT.  Today, the patient reports she is overall doing well. She still gets SOB when she is walking. Heart is beating fast. No chest pain. She has occasional dependent edema at the end of  the day. No orthopnea or pnd.  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 extensive intermediate grade DCIS.    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   DDD (degenerative disc disease), lumbar    Dyspnea    GERD (gastroesophageal reflux disease)    Heart murmur    History of blood transfusion    Hyperlipidemia    Hypertension    Personal history of chemotherapy 2016   right breast ca   Personal history of radiation therapy 2016   mammosite   SOB (shortness of breath) 12/03/2014    Past Surgical History:  Procedure Laterality Date   ABDOMINAL HYSTERECTOMY     AXILLARY LYMPH NODE BIOPSY Right 07/14/2014   Procedure: AXILLARY LYMPH NODE BIOPSY;  Surgeon: Earline Mayotte, MD;  Location: [facility] ORS;  Service: General;  Laterality: Right;   BACK SURGERY  12/29/2020   BOTOX INJECTION N/A 01/13/2020   Procedure: BOTOX INJECTION;  Surgeon: Vanna Scotland, MD;  Location: [facility] OR[facility];  Service: Urology;  Laterality: N/A;   BREAST BIOPSY Left 2012   benign   BREAST BIOPSY Right 05/26/2014   DCIS    BREAST EXCISIONAL BIOPSY Right 06/28/2017  ULCERATED SKIN WITH UNDERLYING FAT NECROSIS, ACUTE INFLAMMATION AND REACTIVE EPITHELIAL ATYPIA   BREAST LUMPECTOMY Right 06/10/2014   DCIS and Invasive ductal carcinoma, clear margins   BREAST SURGERY Right 06/10/2014   Wide excision for intermediate grade DCIS, identification of a 10  millimeter area of invasive mammary carcinoma.   CHOLECYSTECTOMY  2012   COLONOSCOPY  2009   EYE SURGERY Bilateral 2014   cataract   IR KYPHO EA ADDL LEVEL THORACIC OR LUMBAR  02/12/2021   JOINT REPLACEMENT Left 2001   knee   JOINT REPLACEMENT Right 2003   knee   JOINT REPLACEMENT Left 2004    replacement joint broken   kneee surgery Left    PORT-A-CATH REMOVAL  02/11/2016   Dr Lemar Livings   PORTACATH PLACEMENT Left 08/27/2014   Procedure: INSERTION PORT-A-CATH;  Surgeon: Earline Mayotte, MD;  Location: [facility] OR[facility];  Service: General;  Laterality: Left;   PULMONARY THROMBECTOMY Bilateral 08/02/2022   Procedure: PULMONARY THROMBECTOMY;  Surgeon: Renford Dills, MD;  Location: [facility];  Service: Cardiovascular;  Laterality: Bilateral;    Current Medications: Current Meds  Medication Sig   acetaminophen (TYLENOL) 325 MG tablet Take 2 tablets (650 mg total) by mouth every 6 (six) hours as needed for mild pain or fever.   amiodarone (PACERONE) 200 MG tablet Take 1 tablet (200 mg total) by mouth daily.   apixaban (ELIQUIS) 5 MG TABS tablet Take 1 tablet (5 mg total) by mouth 2 (two) times daily.   febuxostat (ULORIC) 40 MG tablet Take 40 mg by mouth daily.   furosemide (LASIX) 20 MG tablet Take 1 tablet by mouth once daily   metoprolol tartrate (LOPRESSOR) 100 MG tablet Take 1 tablet (100 mg total) by mouth 2 (two) times daily.   Multiple Vitamin (MULTIVITAMIN WITH MINERALS) TABS tablet Take 1 tablet by mouth daily.   simvastatin (ZOCOR) 40 MG tablet Take 40 mg by mouth at bedtime.    tamoxifen (NOLVADEX) 20 MG tablet TAKE 1 TABLET BY MOUTH DAILY   venlafaxine XR (EFFEXOR-XR) 75 MG 24 hr capsule TAKE 1 CAPSULE BY MOUTH DAILY  WITH BREAKFAST   Vitamin D, Ergocalciferol, (DRISDOL) 1.25 MG (50000 UNIT) CAPS capsule TAKE 1 CAPSULE BY MOUTH EVERY 7  DAYS TAKE ON MONDAYS     Allergies:   Patient has no known allergies.   Social History   Socioeconomic History   Marital status: Married    Spouse name: Alinda Money   Number of children: 1   Years of education: Not on file   Highest education level: Not on file  Occupational History   Occupation: house cleaner    Comment: retired  Tobacco Use   Smoking status: Never   Smokeless tobacco: Never  Substance and Sexual Activity    Alcohol use: No    Alcohol/week: 0.0 standard drinks of alcohol   Drug use: No   Sexual activity: Not Currently  Other Topics Concern   Not on file  Social History Narrative   Patient lives with husband and feels safe in her home.   Remains active, drives and is self sufficient.   Social Determinants of Health   Financial Resource Strain: Low Risk  (03/16/2022)   Received from [facility]   Overall Financial Resource Strain (CARDIA)    Difficulty of Paying Living Expenses: Not hard at all  Food Insecurity: No Food Insecurity (07/31/2022)   Hunger Vital Sign    Worried About Running Out of  Food in the Last Year: Never true    Ran Out of Food in the Last Year: Never true  Transportation Needs: No Transportation Needs (07/31/2022)   PRAPARE - Administrator, Civil Service (Medical): No    Lack of Transportation (Non-Medical): No  Physical Activity: Not on file  Stress: Not on file  Social Connections: Not on file     Family History: The patient's family history includes Heart disease in her sister. There is no history of Breast cancer.  ROS:   Please see the history of present illness.     All other systems reviewed and are negative.  EKGs/Labs/Other Studies Reviewed:    The following studies were reviewed today:  Myoview lexiscan 08/2022    Abnormal pharmacologic myocardial perfusion stress test.   There is a small in size, mild in severity, fixed mid inferolateral and apical lateral defect that may represent subtle scar and cannot rule out artifact.   No significant ischemia is identified.   Left ventricular systolic function is normal (LVEF > 65%).   Coronary artery calcification and aortic atherosclerosis are noted on the attenuation correction CT.   This is a low-risk study.  Echo 07/2022 1. Left ventricular ejection fraction, by estimation, is 60 to 65%. The  left ventricle has normal function. Left  ventricular endocardial border  not optimally defined to evaluate regional wall motion. mild to moderate  left ventricular hypertrophy. Left  ventricular diastolic function could not be evaluated.   2. Right ventricular systolic function is normal. The right ventricular  size is mildly enlarged. There is mildly elevated pulmonary artery  systolic pressure.   3. The mitral valve is grossly normal. Trivial mitral valve  regurgitation. No evidence of mitral stenosis.   4. The aortic valve is tricuspid. Aortic valve regurgitation is not  visualized. No aortic stenosis is present.   5. There is borderline dilatation of the ascending aorta, measuring 36  mm.   EKG:  EKG is  ordered today.  The ekg ordered today demonstrates NSR, 79bpm PAC  Recent Labs: 07/30/2022: ALT 15; B Natriuretic Peptide 158.9 07/31/2022: TSH 5.070 08/04/2022: Magnesium 2.0 09/02/2022: BUN 19; Creatinine, Ser 1.32; Hemoglobin 12.6; Platelets 159; Potassium 3.5; Sodium 140  Recent Lipid Panel No results found for: "CHOL", "TRIG", "HDL", "CHOLHDL", "VLDL", "LDLCALC", "LDLDIRECT"   Physical Exam:    VS:  BP 124/70 (BP Location: Left Arm, Patient Position: Sitting)   Pulse (!) 58   Ht 5\' 3"  (1.6 m)   Wt 275 lb 4.8 oz (124.9 kg)   SpO2 95%   BMI 48.77 kg/m     Wt Readings from Last 3 Encounters:  11/18/22 275 lb 4.8 oz (124.9 kg)  09/19/22 274 lb (124.3 kg)  09/02/22 274 lb (124.3 kg)     GEN:  Well nourished, well developed in no acute distress HEENT: Normal NECK: No JVD; No carotid bruits LYMPHATICS: No lymphadenopathy CARDIAC: RRR, no murmurs, rubs, gallops RESPIRATORY:  Clear to auscultation without rales, wheezing or rhonchi  ABDOMEN: Soft, non-tender, non-distended MUSCULOSKELETAL:  No edema; No deformity  SKIN: Warm and dry NEUROLOGIC:  Alert and oriented x 3 PSYCHIATRIC:  Normal affect   ASSESSMENT:    1. Paroxysmal atrial fibrillation (HCC)   2. Bilateral pulmonary embolism (HCC)   3. Stage 3  chronic kidney disease, unspecified whether stage 3a or 3b CKD (HCC)   4. Elevated troponin    PLAN:    In order of problems listed above:  Afib Patient  remains in NSR. She is on amiodarone 200mg  daily and metoprolol 100mg  BID. She is wondering about coming off one medication. May be able to come off amiodarone if she remains in NSR. Continue Eliquis 5mg  BID for stroke ppx.   Bilateral pulmonary embolism She follows up with [facility]. She is on Eliquis 5mg  BID. Follow-up Chest CTA showed no residual PE. She reports persistent DOE, which is slowly improving.   CKD Scr baseline around 1.2.   Elevated troponin Elevated troponin in the setting of bilateral PE and afib. Recent myoview lexiscan was abnormal, small in size, fixed mid inferolateral and apical lateral defect that may represent subtle scar but cannot rule out artifact, no ischemia, normal LVEF, low risk study, coronary artery calcification and aortic atherosclerosis noted on CT images. Patient denies chest pain.   Disposition: Follow up in 3 month(s) with MD/APP    Signed, Frazier Balfour David Stall, PA-C  11/18/2022 1:12 PM    [facility]

## 2022-11-18 NOTE — Patient Instructions (Signed)
Medication Instructions:  Your physician recommends that you continue on your current medications as directed. Please refer to the Current Medication list given to you today.   *If you need a refill on your cardiac medications before your next appointment, please call your pharmacy*   Lab Work: No labs ordered today    Testing/Procedures: No test ordered today    Follow-Up: At [facility], you and your health needs are our priority.  As part of our continuing mission to provide you with exceptional heart care, we have created designated Provider Care Teams.  These Care Teams include your primary Cardiologist (physician) and Advanced Practice Providers (APPs -  Physician Assistants and Nurse Practitioners) who all work together to provide you with the care you need, when you need it.  We recommend signing up for the patient portal called "MyChart".  Sign up information is provided on this After Visit Summary.  MyChart is used to connect with patients for Virtual Visits (Telemedicine).  Patients are able to view lab/test results, encounter notes, upcoming appointments, etc.  Non-urgent messages can be sent to your provider as well.   To learn more about what you can do with MyChart, go to [email or web address].    Your next appointment:   3 month(s)  Provider:   You may see Yvonne Kendall, MD or one of the following Advanced Practice Providers on your designated Care Team:   Nicolasa Ducking, NP Eula Listen, PA-C Cadence Fransico Michael, PA-C Charlsie Quest, NP

## 2022-11-28 ENCOUNTER — Other Ambulatory Visit: Payer: Self-pay | Admitting: Cardiovascular Disease

## 2022-11-28 ENCOUNTER — Other Ambulatory Visit: Payer: Self-pay | Admitting: Medical

## 2022-12-02 ENCOUNTER — Encounter: Payer: Self-pay | Admitting: Internal Medicine

## 2022-12-02 ENCOUNTER — Inpatient Hospital Stay: Payer: Medicare Other | Admitting: Nurse Practitioner

## 2022-12-02 ENCOUNTER — Inpatient Hospital Stay: Payer: Medicare Other | Attending: Internal Medicine | Admitting: Internal Medicine

## 2022-12-02 VITALS — BP 128/82 | HR 65 | Temp 98.3°F | Ht 63.0 in | Wt 277.4 lb

## 2022-12-02 DIAGNOSIS — N1832 Chronic kidney disease, stage 3b: Secondary | ICD-10-CM | POA: Insufficient documentation

## 2022-12-02 DIAGNOSIS — Z79899 Other long term (current) drug therapy: Secondary | ICD-10-CM | POA: Diagnosis not present

## 2022-12-02 DIAGNOSIS — M8580 Other specified disorders of bone density and structure, unspecified site: Secondary | ICD-10-CM | POA: Diagnosis not present

## 2022-12-02 DIAGNOSIS — Z79810 Long term (current) use of selective estrogen receptor modulators (SERMs): Secondary | ICD-10-CM | POA: Insufficient documentation

## 2022-12-02 DIAGNOSIS — R5383 Other fatigue: Secondary | ICD-10-CM | POA: Diagnosis not present

## 2022-12-02 DIAGNOSIS — I129 Hypertensive chronic kidney disease with stage 1 through stage 4 chronic kidney disease, or unspecified chronic kidney disease: Secondary | ICD-10-CM | POA: Diagnosis not present

## 2022-12-02 DIAGNOSIS — Z923 Personal history of irradiation: Secondary | ICD-10-CM | POA: Insufficient documentation

## 2022-12-02 DIAGNOSIS — Z170 Estrogen receptor positive status [ER+]: Secondary | ICD-10-CM | POA: Diagnosis not present

## 2022-12-02 DIAGNOSIS — M109 Gout, unspecified: Secondary | ICD-10-CM | POA: Insufficient documentation

## 2022-12-02 DIAGNOSIS — Z6841 Body Mass Index (BMI) 40.0 and over, adult: Secondary | ICD-10-CM | POA: Insufficient documentation

## 2022-12-02 DIAGNOSIS — I2699 Other pulmonary embolism without acute cor pulmonale: Secondary | ICD-10-CM | POA: Diagnosis not present

## 2022-12-02 DIAGNOSIS — Z90710 Acquired absence of both cervix and uterus: Secondary | ICD-10-CM | POA: Insufficient documentation

## 2022-12-02 DIAGNOSIS — Z1721 Progesterone receptor positive status: Secondary | ICD-10-CM | POA: Insufficient documentation

## 2022-12-02 DIAGNOSIS — Z9049 Acquired absence of other specified parts of digestive tract: Secondary | ICD-10-CM | POA: Diagnosis not present

## 2022-12-02 DIAGNOSIS — M549 Dorsalgia, unspecified: Secondary | ICD-10-CM | POA: Insufficient documentation

## 2022-12-02 DIAGNOSIS — C50811 Malignant neoplasm of overlapping sites of right female breast: Secondary | ICD-10-CM | POA: Diagnosis present

## 2022-12-02 DIAGNOSIS — M79672 Pain in left foot: Secondary | ICD-10-CM | POA: Diagnosis not present

## 2022-12-02 DIAGNOSIS — M79671 Pain in right foot: Secondary | ICD-10-CM | POA: Diagnosis not present

## 2022-12-02 DIAGNOSIS — Z7901 Long term (current) use of anticoagulants: Secondary | ICD-10-CM | POA: Insufficient documentation

## 2022-12-02 DIAGNOSIS — Z8249 Family history of ischemic heart disease and other diseases of the circulatory system: Secondary | ICD-10-CM | POA: Diagnosis not present

## 2022-12-02 DIAGNOSIS — E785 Hyperlipidemia, unspecified: Secondary | ICD-10-CM | POA: Diagnosis not present

## 2022-12-02 DIAGNOSIS — Z9221 Personal history of antineoplastic chemotherapy: Secondary | ICD-10-CM | POA: Insufficient documentation

## 2022-12-02 DIAGNOSIS — M2550 Pain in unspecified joint: Secondary | ICD-10-CM | POA: Diagnosis not present

## 2022-12-02 LAB — CBC WITH DIFFERENTIAL/PLATELET
Abs Immature Granulocytes: 0.04 10*3/uL (ref 0.00–0.07)
Basophils Absolute: 0.1 10*3/uL (ref 0.0–0.1)
Basophils Relative: 1 %
Eosinophils Absolute: 0.2 10*3/uL (ref 0.0–0.5)
Eosinophils Relative: 1 %
HCT: 41.8 % (ref 36.0–46.0)
Hemoglobin: 14 g/dL (ref 12.0–15.0)
Immature Granulocytes: 0 %
Lymphocytes Relative: 34 %
Lymphs Abs: 4.1 10*3/uL — ABNORMAL HIGH (ref 0.7–4.0)
MCH: 32.5 pg (ref 26.0–34.0)
MCHC: 33.5 g/dL (ref 30.0–36.0)
MCV: 97 fL (ref 80.0–100.0)
Monocytes Absolute: 0.9 10*3/uL (ref 0.1–1.0)
Monocytes Relative: 8 %
Neutro Abs: 6.8 10*3/uL (ref 1.7–7.7)
Neutrophils Relative %: 56 %
Platelets: 191 10*3/uL (ref 150–400)
RBC: 4.31 MIL/uL (ref 3.87–5.11)
RDW: 12.7 % (ref 11.5–15.5)
WBC: 12.1 10*3/uL — ABNORMAL HIGH (ref 4.0–10.5)
nRBC: 0 % (ref 0.0–0.2)

## 2022-12-02 LAB — COMPREHENSIVE METABOLIC PANEL
ALT: 18 U/L (ref 0–44)
AST: 30 U/L (ref 15–41)
Albumin: 4 g/dL (ref 3.5–5.0)
Alkaline Phosphatase: 43 U/L (ref 38–126)
Anion gap: 9 (ref 5–15)
BUN: 17 mg/dL (ref 8–23)
CO2: 31 mmol/L (ref 22–32)
Calcium: 8.8 mg/dL — ABNORMAL LOW (ref 8.9–10.3)
Chloride: 101 mmol/L (ref 98–111)
Creatinine, Ser: 1.33 mg/dL — ABNORMAL HIGH (ref 0.44–1.00)
GFR, Estimated: 40 mL/min — ABNORMAL LOW (ref >60)
Glucose, Bld: 94 mg/dL (ref 70–99)
Potassium: 3.9 mmol/L (ref 3.5–5.1)
Sodium: 141 mmol/L (ref 135–145)
Total Bilirubin: 0.8 mg/dL (ref 0.3–1.2)
Total Protein: 7 g/dL (ref 6.5–8.1)

## 2022-12-02 LAB — VITAMIN D 25 HYDROXY (VIT D DEFICIENCY, FRACTURES): Vit D, 25-Hydroxy: 107.95 ng/mL — ABNORMAL HIGH (ref 30–100)

## 2022-12-02 NOTE — Progress Notes (Signed)
Having b/l foot pain, gout, 5/10. She states her pain is in the top of her feet.  Tx for blood clot, lung, pneumonia and afib in June 2024. [facility]. Still having SOB.

## 2022-12-02 NOTE — Assessment & Plan Note (Addendum)
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  No evidnce of recurrence; currently on extended tamoxifen for now for 7 year .I would recommend discontinuation of tamoxifen at this time givenacute PE-see below. Mammogram in August 2024- BIL WNL.  Continue surveillance at this time.  # Hot flashes grade 1-Stable. continue Effexor.   # JUNE 1st, 2024-ACUTE PE- Bilateral segmental pulmonary emboli involving the right middle lobe, right lower lobe and left lower lobe. Positive for acute PE with at least submassive (intermediate risk) PEs/p thrombectomy [Dr.Schneir]- curretly on eliquis indefinitely [vascular]; multifactrial [age, obesity, limited mobility, tamoxifen- stopped see above]  # AUG 2023-- BMD- osteopenia- -1.4 [stable from 2018];Continue calcium plus vitamin D daily- stable. Discussed the potential risk factors for osteoporosis- age/gender/postmenopausal status/use of anti-estrogen treatments. Discussed multiple options including exercise/ calcium and vitamin D supplementation/ and also use of bisphosphonates. Discussed oral bisphosphonates versus parenteral bisphosphonate like Reclast/ RANK ligand inhibitors- desnosumab. Discussed the potential benefits and/side effects  Including but not limited to Osteonecrosis of jaw/ hypocalcemia. Will check labs today including vitamin D- if calcium is ok will proceed with Reclast.    # Hypokalmeia- 3.2; ? HCTZ; defer to PCP.  Recommend compliance with K-Dur-  stable.   # CKD stage III-stable.  # vaccination: ok with flu shot; ok with Covid shot  # Need call re: reclast infusion after labs.   # DISPOSITION:   # labs today-ordered-  # follow up TBD- Dr.B

## 2022-12-02 NOTE — Progress Notes (Signed)
[facility] OFFICE PROGRESS NOTE  Patient Care Team: Pcp, No as PCP - General End, Cristal Deer, MD as PCP - Cardiology (Cardiology) Jerl Mina, MD as Referring Physician (Family Medicine) Lemar Livings, Merrily Pew, MD (General Surgery) Earna Coder, MD as Consulting Physician (Oncology)   Cancer Staging  No matching staging information was found for the patient.    Oncology History Overview Note  1.  Carcinoma of right breast.  Status post lumpectomy and sentinel lymph node evaluation.  Tumor size 1 cm.  Estrogen and progesterone receptor positive.  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  Diagnosis in April of 2016 2.  Accelerated partial breast radiation in June of 2016 3.  Weekly Taxol and Herceptin therapy for adjuvant treatment starting from July of 2016; Finished herceptin July 2017   # 5.Patient has been taken off letrozole because of bony pain and started on tamoxifen 20 mg by mouth daily from May 12, 2015; Tamoxifen [Hx of TAH]  # BMD sep 2018- Osteopenia- 1.3;  # STOP taxmoxifen OCT 2024- [x 7 years; Acute PE in June 2024- on eliquis].     DIAGNOSIS: BREAST CA ER/PR/her 2 NEU POSITIVE  STAGE:  I ;GOALS: cure    Carcinoma of overlapping sites of right breast in female, estrogen receptor positive (HCC)      INTERVAL HISTORY: Walks with a cane.  Ambulating independently with a cane.  Alone.  Maria Gutierrez 82 y.o.  female pleasant patient above history of stage I breast cancer ER PR positive HER-2/neu positive on tamoxifen; history of PE June 2024 on Eliquis; compression fractures status post kyphoplasty 2023 is here for follow-up.  Patient is having b/l foot pain, gout, 5/10. She states her pain is in the top of her feet.  Currently on medication followed by PCP.   In the interim patient was diagnosed with acute PE June 2024.  Patient s/p thrombectomy.  Patient continues to complain of chronic fatigue.  Denies any nausea vomiting abdominal pain.  Patient  denies any new lumps or bumps.   Review of Systems  Constitutional:  Positive for malaise/fatigue. Negative for chills, diaphoresis, fever and weight loss.  HENT:  Negative for nosebleeds and sore throat.   Eyes:  Negative for double vision.  Respiratory:  Negative for cough, hemoptysis, sputum production, shortness of breath and wheezing.   Cardiovascular:  Negative for chest pain, palpitations, orthopnea and leg swelling.  Gastrointestinal:  Negative for abdominal pain, blood in stool, constipation, diarrhea, heartburn, melena, nausea and vomiting.  Genitourinary:  Negative for dysuria, frequency and urgency.  Musculoskeletal:  Positive for back pain and joint pain.  Skin: Negative.  Negative for itching and rash.  Neurological:  Negative for dizziness, tingling, focal weakness, weakness and headaches.  Endo/Heme/Allergies:  Does not bruise/bleed easily.  Psychiatric/Behavioral:  Negative for depression. The patient is not nervous/anxious and does not have insomnia.       PAST MEDICAL HISTORY :  Past Medical History:  Diagnosis Date   Anemia    B12, d2 and magnesium deficiencies   Anxiety    Arthritis    OSTEOARTHRITIS   Breast cancer of upper-outer quadrant of right female breast (HCC) 06/10/2014   10 mm invasive mammary carcinoma, T1b,Nx; ER 90%, 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 extensive intermediate grade DCIS.    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   DDD (degenerative disc disease), lumbar    Dyspnea    GERD (gastroesophageal reflux disease)    Heart murmur  History of blood transfusion    Hyperlipidemia    Hypertension    Personal history of chemotherapy 2016   right breast ca   Personal history of radiation therapy 2016   mammosite   SOB (shortness of breath) 12/03/2014    PAST SURGICAL HISTORY :   Past Surgical History:  Procedure Laterality Date   ABDOMINAL HYSTERECTOMY     AXILLARY LYMPH NODE BIOPSY Right 07/14/2014   Procedure:  AXILLARY LYMPH NODE BIOPSY;  Surgeon: Earline Mayotte, MD;  Location: [facility] ORS;  Service: General;  Laterality: Right;   BACK SURGERY  12/29/2020   BOTOX INJECTION N/A 01/13/2020   Procedure: BOTOX INJECTION;  Surgeon: Vanna Scotland, MD;  Location: [facility] ORS;  Service: Urology;  Laterality: N/A;   BREAST BIOPSY Left 2012   benign   BREAST BIOPSY Right 05/26/2014   DCIS    BREAST EXCISIONAL BIOPSY Right 06/28/2017    ULCERATED SKIN WITH UNDERLYING FAT NECROSIS, ACUTE INFLAMMATION AND REACTIVE EPITHELIAL ATYPIA   BREAST LUMPECTOMY Right 06/10/2014   DCIS and Invasive ductal carcinoma, clear margins   BREAST SURGERY Right 06/10/2014   Wide excision for intermediate grade DCIS, identification of a 10  millimeter area of invasive mammary carcinoma.   CHOLECYSTECTOMY  2012   COLONOSCOPY  2009   EYE SURGERY Bilateral 2014   cataract   IR KYPHO EA ADDL LEVEL THORACIC OR LUMBAR  02/12/2021   JOINT REPLACEMENT Left 2001   knee   JOINT REPLACEMENT Right 2003   knee   JOINT REPLACEMENT Left 2004   replacement joint broken   kneee surgery Left    PORT-A-CATH REMOVAL  02/11/2016   Dr Lemar Livings   PORTACATH PLACEMENT Left 08/27/2014   Procedure: INSERTION PORT-A-CATH;  Surgeon: Earline Mayotte, MD;  Location: [facility] ORS;  Service: General;  Laterality: Left;   PULMONARY THROMBECTOMY Bilateral 08/02/2022   Procedure: PULMONARY THROMBECTOMY;  Surgeon: Renford Dills, MD;  Location: [facility] IN[facility]IVE [facility];  Service: Cardiovascular;  Laterality: Bilateral;    FAMILY HISTORY :   Family History  Problem Relation Age of Onset   Heart disease Sister    Breast cancer Neg Hx     SOCIAL HISTORY:   Social History   Tobacco Use   Smoking status: Never   Smokeless tobacco: Never  Substance Use Topics   Alcohol use: No    Alcohol/week: 0.0 standard drinks of alcohol   Drug use: No    ALLERGIES:  has No Known Allergies.  MEDICATIONS:  Current Outpatient Medications  Medication Sig Dispense  Refill   acetaminophen (TYLENOL) 325 MG tablet Take 2 tablets (650 mg total) by mouth every 6 (six) hours as needed for mild pain or fever. 20 tablet 0   amiodarone (PACERONE) 200 MG tablet Take 1 tablet by mouth once daily 90 tablet 0   apixaban (ELIQUIS) 5 MG TABS tablet Take 1 tablet (5 mg total) by mouth 2 (two) times daily. 60 tablet 5   calcium carbonate (OS-CAL) 600 MG TABS tablet Take 600 mg by mouth daily.     cyclobenzaprine (FLEXERIL) 10 MG tablet      febuxostat (ULORIC) 40 MG tablet Take 40 mg by mouth daily.     furosemide (LASIX) 20 MG tablet Take 1 tablet by mouth once daily 30 tablet 2   metoprolol tartrate (LOPRESSOR) 100 MG tablet Take 1 tablet (100 mg total) by mouth 2 (two) times daily. 180 tablet 1   Multiple Vitamin (MULTIVITAMIN WITH MINERALS) TABS tablet Take 1  tablet by mouth daily.     pantoprazole (PROTONIX) 40 MG tablet Take 40 mg by mouth daily.     simvastatin (ZOCOR) 40 MG tablet Take 40 mg by mouth at bedtime.      tamoxifen (NOLVADEX) 20 MG tablet TAKE 1 TABLET BY MOUTH DAILY 70 tablet 4   venlafaxine XR (EFFEXOR-XR) 75 MG 24 hr capsule TAKE 1 CAPSULE BY MOUTH DAILY  WITH BREAKFAST 90 capsule 3   Vitamin D, Ergocalciferol, (DRISDOL) 1.25 MG (50000 UNIT) CAPS capsule TAKE 1 CAPSULE BY MOUTH EVERY 7  DAYS TAKE ON MONDAYS 15 capsule 2   cyanocobalamin 1000 MCG tablet Take 1,000 mcg by mouth daily. (Patient not taking: Reported on 09/19/2022)     No current facility-administered medications for this visit.   Facility-Administered Medications Ordered in Other Visits  Medication Dose Route Frequency Provider Last Rate Last Admin   sodium chloride 0.9 % injection 10 mL  10 mL Intravenous PRN Choksi, Valarie Cones, MD   10 mL at 01/27/15 1416   sodium chloride flush (NS) 0.9 % injection 10 mL  10 mL Intravenous PRN Earna Coder, MD   10 mL at 12/10/15 1347    PHYSICAL EXAMINATION: ECOG PERFORMANCE STATUS: 0 - Asymptomatic  BP 128/82 (BP Location: Left Arm,  Patient Position: Sitting, Cuff Size: Normal)   Pulse 65   Temp 98.3 F (36.8 C) (Tympanic)   Ht 5\' 3"  (1.6 m)   Wt 277 lb 6.4 oz (125.8 kg)   SpO2 94%   BMI 49.14 kg/m   Filed Weights   12/02/22 1046  Weight: 277 lb 6.4 oz (125.8 kg)   Physical Exam Constitutional:      Comments: Obese. Alone.   HENT:     Head: Normocephalic and atraumatic.     Mouth/Throat:     Pharynx: No oropharyngeal exudate.  Eyes:     Pupils: Pupils are equal, round, and reactive to light.  Cardiovascular:     Rate and Rhythm: Normal rate and regular rhythm.  Pulmonary:     Effort: No respiratory distress.     Breath sounds: Normal breath sounds. No wheezing.  Abdominal:     General: Bowel sounds are normal. There is no distension.     Palpations: Abdomen is soft. There is no mass.     Tenderness: There is no abdominal tenderness. There is no guarding or rebound.  Musculoskeletal:        General: No tenderness. Normal range of motion.     Cervical back: Normal range of motion and neck supple.  Skin:    General: Skin is warm.  Neurological:     Mental Status: She is alert and oriented to person, place, and time.  Psychiatric:        Mood and Affect: Affect normal.        LABORATORY DATA:  I have reviewed the data as listed    Component Value Date/Time   NA 140 09/02/2022 1600   NA 141 06/05/2014 0843   K 3.5 09/02/2022 1600   K 3.5 06/05/2014 0843   CL 102 09/02/2022 1600   CL 105 06/05/2014 0843   CO2 28 09/02/2022 1600   CO2 27 06/05/2014 0843   GLUCOSE 92 09/02/2022 1600   GLUCOSE 94 06/05/2014 0843   BUN 19 09/02/2022 1600   BUN 22 (H) 06/05/2014 0843   CREATININE 1.32 (H) 09/02/2022 1600   CREATININE 1.15 (H) 02/05/2021 1018   CALCIUM 8.6 (L) 09/02/2022 1600   CALCIUM 9.2  06/05/2014 0843   PROT 6.9 07/30/2022 2053   ALBUMIN 3.4 (L) 07/30/2022 2053   AST 34 07/30/2022 2053   ALT 15 07/30/2022 2053   ALKPHOS 54 07/30/2022 2053   BILITOT 1.5 (H) 07/30/2022 2053    GFRNONAA 41 (L) 09/02/2022 1600   GFRNONAA 58 (L) 06/05/2014 0843   GFRAA 44 (L) 08/05/2019 1315   GFRAA >60 06/05/2014 0843    No results found for: "SPEP", "UPEP"  Lab Results  Component Value Date   WBC 11.7 (H) 09/02/2022   NEUTROABS 6.8 06/10/2022   HGB 12.6 09/02/2022   HCT 38.6 09/02/2022   MCV 96.7 09/02/2022   PLT 159 09/02/2022      Chemistry      Component Value Date/Time   NA 140 09/02/2022 1600   NA 141 06/05/2014 0843   K 3.5 09/02/2022 1600   K 3.5 06/05/2014 0843   CL 102 09/02/2022 1600   CL 105 06/05/2014 0843   CO2 28 09/02/2022 1600   CO2 27 06/05/2014 0843   BUN 19 09/02/2022 1600   BUN 22 (H) 06/05/2014 0843   CREATININE 1.32 (H) 09/02/2022 1600   CREATININE 1.15 (H) 02/05/2021 1018      Component Value Date/Time   CALCIUM 8.6 (L) 09/02/2022 1600   CALCIUM 9.2 06/05/2014 0843   ALKPHOS 54 07/30/2022 2053   AST 34 07/30/2022 2053   ALT 15 07/30/2022 2053   BILITOT 1.5 (H) 07/30/2022 2053       RADIOGRAPHIC STUDIES: I have personally reviewed the radiological images as listed and agreed with the findings in the report. No results found.   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  No evidnce of recurrence; currently on extended tamoxifen for now for 7 year .I would recommend discontinuation of tamoxifen at this time givenacute PE-see below. Mammogram in August 2024- BIL WNL.  Continue surveillance at this time.  # Hot flashes grade 1-Stable. continue Effexor.   # JUNE 1st, 2024-ACUTE PE- Bilateral segmental pulmonary emboli involving the right middle lobe, right lower lobe and left lower lobe. Positive for acute PE with at least submassive (intermediate risk) PEs/p thrombectomy [Dr.Schneir]- curretly on eliquis indefinitely [vascular]; multifactrial [age, obesity, limited  mobility, tamoxifen- stopped see above]  # AUG 2023-- BMD- osteopenia- -1.4 [stable from 2018];Continue calcium plus vitamin D daily- stable. Discussed the potential risk factors for osteoporosis- age/gender/postmenopausal status/use of anti-estrogen treatments. Discussed multiple options including exercise/ calcium and vitamin D supplementation/ and also use of bisphosphonates. Discussed oral bisphosphonates versus parenteral bisphosphonate like Reclast/ RANK ligand inhibitors- desnosumab. Discussed the potential benefits and/side effects  Including but not limited to Osteonecrosis of jaw/ hypocalcemia. Will check labs today including vitamin D- if calcium is ok will proceed with Reclast.    # Hypokalmeia- 3.2; ? HCTZ; defer to PCP.  Recommend compliance with K-Dur-  stable.   # CKD stage III-stable.  # vaccination: ok with flu shot; ok with Covid shot  # Need call re: reclast infusion after labs.   # DISPOSITION:   # labs today-ordered-  # follow up TBD- Dr.B   Orders Placed This Encounter  Procedures   CBC with Differential/Platelet    Standing Status:   Future    Number of Occurrences:   1    Standing Expiration Date:   12/02/2023   Comprehensive metabolic panel  Standing Status:   Future    Number of Occurrences:   1    Standing Expiration Date:   12/02/2023   VITAMIN D 25 Hydroxy (Vit-D Deficiency, Fractures)    Standing Status:   Future    Number of Occurrences:   1    Standing Expiration Date:   12/02/2023   All questions were answered. The patient knows to call the clinic with any problems, questions or concerns.      Earna Coder, MD 12/02/2022 11:59 AM

## 2022-12-12 ENCOUNTER — Other Ambulatory Visit: Payer: Self-pay | Admitting: Medical

## 2022-12-12 DIAGNOSIS — I4891 Unspecified atrial fibrillation: Secondary | ICD-10-CM

## 2022-12-12 NOTE — Telephone Encounter (Signed)
Prescription refill request for Eliquis received. Indication: PAF Last office visit: 11/18/22  Peggyann Juba PA-C Scr: 1.33 on 12/02/22  Epic Age: 82 Weight: 124.9kg  Based on above findings Eliquis 5mg  twice daily is the appropriate dose.  Refill approved.

## 2022-12-19 ENCOUNTER — Other Ambulatory Visit: Payer: Self-pay | Admitting: Internal Medicine

## 2022-12-19 NOTE — Progress Notes (Signed)
Please have pt follow up in 6 months- MD; labs- cbc/cmp; vit 25-OH; and possible reclast- GB

## 2022-12-20 ENCOUNTER — Other Ambulatory Visit: Payer: Self-pay

## 2022-12-20 DIAGNOSIS — Z170 Estrogen receptor positive status [ER+]: Secondary | ICD-10-CM

## 2022-12-20 NOTE — Progress Notes (Signed)
Labs entered.

## 2022-12-21 ENCOUNTER — Encounter: Payer: Self-pay | Admitting: Internal Medicine

## 2023-02-14 ENCOUNTER — Other Ambulatory Visit: Payer: Self-pay | Admitting: Cardiovascular Disease

## 2023-02-14 ENCOUNTER — Other Ambulatory Visit: Payer: Self-pay | Admitting: Medical

## 2023-02-17 ENCOUNTER — Ambulatory Visit: Payer: Medicare Other | Attending: Medical | Admitting: Medical

## 2023-02-17 ENCOUNTER — Encounter: Payer: Self-pay | Admitting: Medical

## 2023-02-17 VITALS — BP 116/71 | HR 66 | Ht 61.5 in | Wt 275.0 lb

## 2023-02-17 DIAGNOSIS — I480 Paroxysmal atrial fibrillation: Secondary | ICD-10-CM

## 2023-02-17 DIAGNOSIS — I2699 Other pulmonary embolism without acute cor pulmonale: Secondary | ICD-10-CM

## 2023-02-17 DIAGNOSIS — N183 Chronic kidney disease, stage 3 unspecified: Secondary | ICD-10-CM

## 2023-02-17 DIAGNOSIS — R0602 Shortness of breath: Secondary | ICD-10-CM | POA: Diagnosis not present

## 2023-02-17 DIAGNOSIS — I2510 Atherosclerotic heart disease of native coronary artery without angina pectoris: Secondary | ICD-10-CM | POA: Diagnosis not present

## 2023-02-17 MED ORDER — ALBUTEROL SULFATE HFA 108 (90 BASE) MCG/ACT IN AERS: 2.0000 | INHALATION_SPRAY | Freq: Four times a day (QID) | RESPIRATORY_TRACT | 0 refills | Status: DC | PRN

## 2023-02-17 NOTE — Progress Notes (Signed)
Cardiology Office Note:    Date:  02/17/2023   ID:  Maria Gutierrez, DOB 11/26/1940, MRN [id number]  PCP:  Jerl Mina, MD  [facility] Cardiologist:  Yvonne Kendall, MD  [facility] Electrophysiologist:  None   Referring MD: No ref. provider found   Chief Complaint: 3 month follow-up  History of Present Illness:    Maria Gutierrez is a 82 y.o. female with a hx of  HTN, obesity, depression and anxiety, CKD stage 3, PE in 07/2022, breast cancer s/p radiation and chemotherapy, and gout who present for follow-up.    The patient was admitted 07/2022 for acute PE, severe sepsis 2/2 PNA, acute respiratiry failure, AKI on CKD stage 3, and new onset Afib. She presented with SOB fount to have bilateral PE started on IV heparin. She was in sinus tach/MAT, and later on developed Afib. Patient underwent mechanical thrombectomy by [facility] on 6/4. Afib rates were uncontrolled with just metoprolol and she was started on amiodarone. HS troponin was elevated 24>25.  Echo showed LVEF 60-65%, mild to mod hypertrophjy, normal RVSF, mildly elevated pulmonary artery pressure, trivial MR, ascending aorta measuring 36mm. She was transitioned to oral amiodarone and started on Eliquis 5mg  BID.    Follow-up Myoview Maria Gutierrez was low risk normal from logical myocardial perfusion test with a small in size, mild in severity, fixed mid inferolateral and apical lateral defect that may represent subtle scarring cannot rule out artifact.  No ischemia noted.  EF 65%.  30 calcification and aortic atherosclerosis were noted on attenuation correction CT.  The patient was last seen 11/18/22 and was overall doing well from a cardiac perspective.   Today, the patient reports fatigue for the last few months. Feels breathing has gotten worse. She reports wheezing at night. She ised to have allergies. She has been using a cane. She denies chest pain. She has DOE. No lower leg edema. No fever, chills, nausea and vomiting.    Past  Medical History:  Diagnosis Date   Anemia    B12, d2 and magnesium deficiencies   Anxiety    Arthritis    OSTEOARTHRITIS   Breast cancer of upper-outer quadrant of right female breast (HCC) 06/10/2014   10 mm invasive mammary carcinoma, T1b,Nx; ER 90%, 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 extensive intermediate grade DCIS.    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   DDD (degenerative disc disease), lumbar    Dyspnea    GERD (gastroesophageal reflux disease)    Heart murmur    History of blood transfusion    Hyperlipidemia    Hypertension    Personal history of chemotherapy 2016   right breast ca   Personal history of radiation therapy 2016   mammosite   SOB (shortness of breath) 12/03/2014    Past Surgical History:  Procedure Laterality Date   ABDOMINAL HYSTERECTOMY     AXILLARY LYMPH NODE BIOPSY Right 07/14/2014   Procedure: AXILLARY LYMPH NODE BIOPSY;  Surgeon: Earline Mayotte, MD;  Location: [facility] ORS;  Service: General;  Laterality: Right;   BACK SURGERY  12/29/2020   BOTOX INJECTION N/A 01/13/2020   Procedure: BOTOX INJECTION;  Surgeon: Vanna Scotland, MD;  Location: [facility] ORS;  Service: Urology;  Laterality: N/A;   BREAST BIOPSY Left 2012   benign   BREAST BIOPSY Right 05/26/2014   DCIS    BREAST EXCISIONAL BIOPSY Right 06/28/2017    ULCERATED SKIN WITH UNDERLYING FAT NECROSIS, ACUTE INFLAMMATION AND REACTIVE EPITHELIAL  ATYPIA   BREAST LUMPECTOMY Right 06/10/2014   DCIS and Invasive ductal carcinoma, clear margins   BREAST SURGERY Right 06/10/2014   Wide excision for intermediate grade DCIS, identification of a 10  millimeter area of invasive mammary carcinoma.   CHOLECYSTECTOMY  2012   COLONOSCOPY  2009   EYE SURGERY Bilateral 2014   cataract   IR KYPHO EA ADDL LEVEL THORACIC OR LUMBAR  02/12/2021   JOINT REPLACEMENT Left 2001   knee   JOINT REPLACEMENT Right 2003   knee   JOINT REPLACEMENT Left 2004   replacement joint broken   kneee  surgery Left    PORT-A-CATH REMOVAL  02/11/2016   Dr Lemar Livings   PORTACATH PLACEMENT Left 08/27/2014   Procedure: INSERTION PORT-A-CATH;  Surgeon: Earline Mayotte, MD;  Location: [facility] ORS;  Service: General;  Laterality: Left;   PULMONARY THROMBECTOMY Bilateral 08/02/2022   Procedure: PULMONARY THROMBECTOMY;  Surgeon: Renford Dills, MD;  Location: [facility] INVASIVE CV LAB;  Service: Cardiovascular;  Laterality: Bilateral;    Current Medications: Current Meds  Medication Sig   acetaminophen (TYLENOL) 325 MG tablet Take 2 tablets (650 mg total) by mouth every 6 (six) hours as needed for mild pain or fever.   albuterol (VENTOLIN HFA) 108 (90 Base) MCG/ACT inhaler Inhale 2 puffs into the lungs every 6 (six) hours as needed for wheezing or shortness of breath.   amiodarone (PACERONE) 200 MG tablet Take 1 tablet by mouth once daily   apixaban (ELIQUIS) 5 MG TABS tablet Take 1 tablet by mouth twice daily   calcium carbonate (OS-CAL) 600 MG TABS tablet Take 600 mg by mouth daily.   cyanocobalamin 1000 MCG tablet Take 1,000 mcg by mouth daily.   cyclobenzaprine (FLEXERIL) 10 MG tablet    febuxostat (ULORIC) 40 MG tablet Take 40 mg by mouth daily.   furosemide (LASIX) 20 MG tablet Take 1 tablet by mouth once daily   metoprolol tartrate (LOPRESSOR) 100 MG tablet Take 1 tablet (100 mg total) by mouth 2 (two) times daily.   Multiple Vitamin (MULTIVITAMIN WITH MINERALS) TABS tablet Take 1 tablet by mouth daily.   pantoprazole (PROTONIX) 40 MG tablet Take 40 mg by mouth daily.   simvastatin (ZOCOR) 40 MG tablet Take 40 mg by mouth at bedtime.    tamoxifen (NOLVADEX) 20 MG tablet TAKE 1 TABLET BY MOUTH DAILY   venlafaxine XR (EFFEXOR-XR) 75 MG 24 hr capsule TAKE 1 CAPSULE BY MOUTH DAILY  WITH BREAKFAST   Vitamin D, Ergocalciferol, (DRISDOL) 1.25 MG (50000 UNIT) CAPS capsule TAKE 1 CAPSULE BY MOUTH EVERY 7  DAYS TAKE ON MONDAYS     Allergies:   Patient has no known allergies.   Social History    Socioeconomic History   Marital status: Married    Spouse name: Alinda Money   Number of children: 1   Years of education: Not on file   Highest education level: Not on file  Occupational History   Occupation: house cleaner    Comment: retired  Tobacco Use   Smoking status: Never   Smokeless tobacco: Never  Substance and Sexual Activity   Alcohol use: No    Alcohol/week: 0.0 standard drinks of alcohol   Drug use: No   Sexual activity: Not Currently  Other Topics Concern   Not on file  Social History Narrative   Patient lives with husband and feels safe in her home.   Remains active, drives and is self sufficient.   Social Drivers of Health  Financial Resource Strain: Low Risk  (03/16/2022)   Received [facility], [facility]   Overall Financial Resource Strain (CARDIA)    Difficulty of Paying Living Expenses: Not hard at all  Food Insecurity: No Food Insecurity (07/31/2022)   Hunger Vital Sign    Worried About Running Out of Food in the Last Year: Never true    Ran Out of Food in the Last Year: Never true  Transportation Needs: No Transportation Needs (07/31/2022)   PRAPARE - Administrator, Civil Service (Medical): No    Lack of Transportation (Non-Medical): No  Physical Activity: Not on file  Stress: Not on file  Social Connections: Not on file     Family History: The patient's family history includes Heart disease in her sister. There is no history of Breast cancer.  ROS:   Please see the history of present illness.     All other systems reviewed and are negative.  EKGs/Labs/Other Studies Reviewed:    The following studies were reviewed today:  Myoview lexiscan 08/2022     Abnormal pharmacologic myocardial perfusion stress test.   There is a small in size, mild in severity, fixed mid inferolateral and apical lateral defect that may represent subtle scar and cannot rule out artifact.   No significant ischemia is  identified.   Left ventricular systolic function is normal (LVEF > 65%).   Coronary artery calcification and aortic atherosclerosis are noted on the attenuation correction CT.   This is a low-risk study.   Echo 07/2022 1. Left ventricular ejection fraction, by estimation, is 60 to 65%. The  left ventricle has normal function. Left ventricular endocardial border  not optimally defined to evaluate regional wall motion. mild to moderate  left ventricular hypertrophy. Left  ventricular diastolic function could not be evaluated.   2. Right ventricular systolic function is normal. The right ventricular  size is mildly enlarged. There is mildly elevated pulmonary artery  systolic pressure.   3. The mitral valve is grossly normal. Trivial mitral valve  regurgitation. No evidence of mitral stenosis.   4. The aortic valve is tricuspid. Aortic valve regurgitation is not  visualized. No aortic stenosis is present.   5. There is borderline dilatation of the ascending aorta, measuring 36  mm.   EKG:  EKG is not ordered today.    Recent Labs: 07/30/2022: B Natriuretic Peptide 158.9 07/31/2022: TSH 5.070 08/04/2022: Magnesium 2.0 12/02/2022: ALT 18; BUN 17; Creatinine, Ser 1.33; Hemoglobin 14.0; Platelets 191; Potassium 3.9; Sodium 141  Recent Lipid Panel No results found for: "CHOL", "TRIG", "HDL", "CHOLHDL", "VLDL", "LDLCALC", "LDLDIRECT"   Physical Exam:    VS:  BP 116/71 (BP Location: Left Arm, Patient Position: Sitting, Cuff Size: Large)   Pulse 66   Ht 5' 1.5" (1.562 m)   Wt 275 lb (124.7 kg)   SpO2 93%   BMI 51.12 kg/m     Wt Readings from Last 3 Encounters:  02/17/23 275 lb (124.7 kg)  12/02/22 277 lb 6.4 oz (125.8 kg)  11/18/22 275 lb 4.8 oz (124.9 kg)     GEN:  Well nourished, well developed in no acute distress HEENT: Normal NECK: No JVD; No carotid bruits LYMPHATICS: No lymphadenopathy CARDIAC: RRR, no murmurs, rubs, gallops RESPIRATORY:  minimal wheezing ABDOMEN: Soft,  non-tender, non-distended MUSCULOSKELETAL:  No edema; No deformity  SKIN: Warm and dry NEUROLOGIC:  Alert and oriented x 3 PSYCHIATRIC:  Normal affect   ASSESSMENT:    1.  SOB (shortness of breath)   2. Coronary artery calcification   3. Bilateral pulmonary embolism (HCC)   4. Stage 3 chronic kidney disease, unspecified whether stage 3a or 3b CKD (HCC)   5. Paroxysmal atrial fibrillation (HCC)    PLAN:    In order of problems listed above:  DOE/fatigue Reports worsening fatigue and DOE for the last 3 months. She had a PE in June 2024 and overall feels she recovered from that. She appears euvolemic on exam. She denies chest pain. She has wheezing for which I will send in an albuterol inhaler.  On exam she is in NSR.  Prior MPI was low risk with no ischemia. Echo showed normal LVEF , mild to mod LVH. I will check CBC, CMET, BNP and TSH. I will refer to pulmonology. If symptoms persist, may need to pursue further ischemic testing. Suspect a level of deconditioning is contributing.  Coronary artery calcifications Can consider further ischemic testing if symptoms persist, have to consider CKD.  Bilateral PE Continue Eliquis 5mg  BID. Follow-up chest CT showed no residual PE  Paroxysmal Afib Afib in the setting of bilateral PE in 07/2022. In NSR on exam. Continue amiodarone 200mg  daily and metoprolol 100mg  BID. May be able to stop amiodarone at follow-up. Continue Eliquis 5mg  BID.  CKD stage 3 Baseline scr around 1.2.   Disposition: Follow up in 3 month(s) with MD/APP     Signed, Judie Hollick David Stall, PA-C  02/17/2023 12:40 PM    [facility]

## 2023-02-17 NOTE — Patient Instructions (Addendum)
Medication Instructions:  Your physician has recommended you make the following change in your medication:   USE INHALER as needed  *If you need a refill on your cardiac medications before your next appointment, please call your pharmacy*   Lab Work: CBC, BMET, BNP, TSH  If you have labs (blood work) drawn today and your tests are completely normal, you will receive your results only by: MyChart Message (if you have MyChart) OR A paper copy in the mail If you have any lab test that is abnormal or we need to change your treatment, we will call you to review the results.   Testing/Procedures: None   Follow-Up: At [facility], you and your health needs are our priority.  As part of our continuing mission to provide you with exceptional heart care, we have created designated Provider Care Teams.  These Care Teams include your primary Cardiologist (physician) and Advanced Practice Providers (APPs -  Physician Assistants and Nurse Practitioners) who all work together to provide you with the care you need, when you need it.  We recommend signing up for the patient portal called "MyChart".  Sign up information is provided on this After Visit Summary.  MyChart is used to connect with patients for Virtual Visits (Telemedicine).  Patients are able to view lab/test results, encounter notes, upcoming appointments, etc.  Non-urgent messages can be sent to your provider as well.   To learn more about what you can do with MyChart, go to [email or web address].    Your next appointment:   3 month(s)  Provider:   Yvonne Kendall, MD or Cadence Fransico Michael, [place]

## 2023-02-18 ENCOUNTER — Other Ambulatory Visit: Payer: Self-pay | Admitting: Medical

## 2023-02-18 ENCOUNTER — Other Ambulatory Visit: Payer: Self-pay | Admitting: Cardiovascular Disease

## 2023-02-18 LAB — BASIC METABOLIC PANEL
BUN/Creatinine Ratio: 11 — ABNORMAL LOW (ref 12–28)
BUN: 16 mg/dL (ref 8–27)
CO2: 27 mmol/L (ref 20–29)
Calcium: 9.3 mg/dL (ref 8.7–10.3)
Chloride: 98 mmol/L (ref 96–106)
Creatinine, Ser: 1.51 mg/dL — ABNORMAL HIGH (ref 0.57–1.00)
Glucose: 81 mg/dL (ref 70–99)
Potassium: 3.9 mmol/L (ref 3.5–5.2)
Sodium: 142 mmol/L (ref 134–144)
eGFR: 35 mL/min/{1.73_m2} — ABNORMAL LOW (ref >59)

## 2023-02-18 LAB — CBC
Hematocrit: 42.2 % (ref 34.0–46.6)
Hemoglobin: 13.9 g/dL (ref 11.1–15.9)
MCH: 32.6 pg (ref 26.6–33.0)
MCHC: 32.9 g/dL (ref 31.5–35.7)
MCV: 99 fL — ABNORMAL HIGH (ref 79–97)
Platelets: 223 10*3/uL (ref 150–450)
RBC: 4.27 x10E6/uL (ref 3.77–5.28)
RDW: 12.1 % (ref 11.7–15.4)
WBC: 12.7 10*3/uL — ABNORMAL HIGH (ref 3.4–10.8)

## 2023-02-18 LAB — TSH: TSH: 69 u[IU]/mL — ABNORMAL HIGH (ref 0.450–4.500)

## 2023-02-18 LAB — BRAIN NATRIURETIC PEPTIDE: BNP: 64.7 pg/mL (ref 0.0–100.0)

## 2023-02-21 ENCOUNTER — Telehealth: Payer: Self-pay | Admitting: Cardiovascular Disease

## 2023-02-21 DIAGNOSIS — R7989 Other specified abnormal findings of blood chemistry: Secondary | ICD-10-CM

## 2023-02-21 NOTE — Telephone Encounter (Signed)
Patient has been made aware. She will come Thursday for labs and call her PCP.   Cadence David Stall, PA-C 02/20/2023  3:52 PM EST     Tsh is significantly elevated. We need FT4 and FT3 and she NEEDS to see PCP ASAP

## 2023-02-21 NOTE — Telephone Encounter (Signed)
Patient returned RN's call regarding lab results.

## 2023-02-24 ENCOUNTER — Encounter: Payer: Self-pay | Admitting: Pulmonary Disease

## 2023-02-24 ENCOUNTER — Other Ambulatory Visit: Payer: Self-pay

## 2023-02-24 DIAGNOSIS — Z79899 Other long term (current) drug therapy: Secondary | ICD-10-CM

## 2023-02-24 LAB — T4, FREE: Free T4: 0.56 ng/dL — ABNORMAL LOW (ref 0.82–1.77)

## 2023-02-24 LAB — T3, FREE: T3, Free: 2 pg/mL (ref 2.0–4.4)

## 2023-03-12 ENCOUNTER — Other Ambulatory Visit: Payer: Self-pay | Admitting: Internal Medicine

## 2023-03-14 ENCOUNTER — Encounter: Payer: Self-pay | Admitting: Internal Medicine

## 2023-03-16 DIAGNOSIS — R7303 Prediabetes: Secondary | ICD-10-CM | POA: Diagnosis not present

## 2023-03-16 DIAGNOSIS — I10 Essential (primary) hypertension: Secondary | ICD-10-CM | POA: Diagnosis not present

## 2023-03-16 DIAGNOSIS — E559 Vitamin D deficiency, unspecified: Secondary | ICD-10-CM | POA: Diagnosis not present

## 2023-03-16 DIAGNOSIS — M1A0790 Idiopathic chronic gout, unspecified ankle and foot, without tophus (tophi): Secondary | ICD-10-CM | POA: Diagnosis not present

## 2023-03-16 DIAGNOSIS — E785 Hyperlipidemia, unspecified: Secondary | ICD-10-CM | POA: Diagnosis not present

## 2023-03-17 ENCOUNTER — Encounter: Payer: Self-pay | Admitting: Internal Medicine

## 2023-03-17 DIAGNOSIS — R8290 Unspecified abnormal findings in urine: Secondary | ICD-10-CM | POA: Diagnosis not present

## 2023-03-21 ENCOUNTER — Encounter: Payer: Self-pay | Admitting: Internal Medicine

## 2023-03-23 DIAGNOSIS — M1990 Unspecified osteoarthritis, unspecified site: Secondary | ICD-10-CM | POA: Diagnosis not present

## 2023-03-23 DIAGNOSIS — Z1331 Encounter for screening for depression: Secondary | ICD-10-CM | POA: Diagnosis not present

## 2023-03-23 DIAGNOSIS — Z170 Estrogen receptor positive status [ER+]: Secondary | ICD-10-CM | POA: Diagnosis not present

## 2023-03-23 DIAGNOSIS — R000 Tachycardia, unspecified: Secondary | ICD-10-CM | POA: Diagnosis not present

## 2023-03-23 DIAGNOSIS — C50811 Malignant neoplasm of overlapping sites of right female breast: Secondary | ICD-10-CM | POA: Diagnosis not present

## 2023-03-23 DIAGNOSIS — R0602 Shortness of breath: Secondary | ICD-10-CM | POA: Diagnosis not present

## 2023-03-23 DIAGNOSIS — M25531 Pain in right wrist: Secondary | ICD-10-CM | POA: Diagnosis not present

## 2023-03-23 DIAGNOSIS — I10 Essential (primary) hypertension: Secondary | ICD-10-CM | POA: Diagnosis not present

## 2023-03-23 DIAGNOSIS — I4891 Unspecified atrial fibrillation: Secondary | ICD-10-CM | POA: Diagnosis not present

## 2023-03-23 DIAGNOSIS — N1832 Chronic kidney disease, stage 3b: Secondary | ICD-10-CM | POA: Diagnosis not present

## 2023-03-23 DIAGNOSIS — E785 Hyperlipidemia, unspecified: Secondary | ICD-10-CM | POA: Diagnosis not present

## 2023-03-23 DIAGNOSIS — Z0000 Encounter for general adult medical examination without abnormal findings: Secondary | ICD-10-CM | POA: Diagnosis not present

## 2023-03-23 NOTE — Progress Notes (Signed)
 QUICK REFERENCE INFORMATION: CMS.gov Medicare Wellness Visits  Medicare Annual Wellness Visit  Subjective:   Maria Gutierrez is a 83 y.o. Female who presents for an Annual Wellness Visit.  Patient Active Problem List  Diagnosis  . Hypertension  . Seasonal allergies  . Hyperlipidemia  . Osteoarthritis (GWK)  . Closed displaced fracture of base of fifth metacarpal bone of right hand, initial encounter  . Carcinoma of overlapping sites of right breast in female, estrogen receptor positive (CMS/HHS-HCC)  . Allergic rhinitis, seasonal  . Bone pain     Outpatient Medications Prior to Visit  Medication Sig Dispense Refill  . AMIOdarone  (PACERONE ) 200 MG tablet Take 200 mg by mouth once daily    . apixaban  (ELIQUIS ) 5 mg tablet Take by mouth    . calcium  carbonate 600 mg (1,500 mg) Tab tablet Take 600 mg by mouth.    . cyanocobalamin (VITAMIN B12) 1000 MCG tablet Take 1,000 mcg by mouth once daily    . cyclobenzaprine (FLEXERIL) 10 MG tablet TAKE 1/2 TO 1 TABLET BY MOUTH AT BEDTIME AS NEEDED 90 tablet 2  . ergocalciferol , vitamin D2, 50,000 unit capsule Take 50,000 Units by mouth once a week      . Febuxostat (ULORIC) 80 mg tablet Take 1 tablet (80 mg total) by mouth once daily for 360 days 90 tablet 3  . FUROsemide  (LASIX ) 20 MG tablet Take 20 mg by mouth once daily    . GLUC SU/CHONDRO SU A/VIT C/MN (GLUCOSAMINE 1500 COMPLEX ORAL) Take by mouth    . HYDROcodone -acetaminophen  (NORCO) 7.5-325 mg tablet Take 1 tablet by mouth 2 (two) times daily as needed 60 tablet 0  . magnesium  oxide 500 mg Tab Take by mouth once daily    . meclizine  (ANTIVERT ) 25 mg tablet TAKE 1 TABLET BY MOUTH THREE TIMES DAILY AS NEEDED FOR DIZZINESS 30 tablet 0  . metoprolol  succinate (TOPROL -XL) 100 MG XL tablet TAKE 1 TABLET BY MOUTH ONCE  DAILY 100 tablet 2  . multivitamin with iron (ONE DAILY) 18-0.4 mg Tab tablet Take 1 tablet by mouth once daily      . pantoprazole  (PROTONIX ) 40 MG DR tablet Take 1 tablet (40 mg  total) by mouth once daily 90 tablet 3  . simvastatin  (ZOCOR ) 40 MG tablet TAKE 1 TABLET BY MOUTH EVERY  NIGHT 100 tablet 2  . venlafaxine  (EFFEXOR -XR) 150 MG XR capsule Take 1 capsule (150 mg total) by mouth once daily 90 capsule 3  . albuterol  90 mcg/actuation inhaler Inhale 2 inhalations into the lungs every 4 (four) hours as needed for Wheezing or Shortness of Breath 1 each 0  . predniSONE  (DELTASONE ) 10 MG tablet Taper 6, 5, 4, 3, 2, 1, off 21 tablet 0  . tamoxifen  (NOLVADEX ) 20 MG tablet Take 20 mg by mouth once daily.     No facility-administered medications prior to visit.    Social History   Socioeconomic History  . Marital status: Married  Tobacco Use  . Smoking status: Never    Passive exposure: Never  . Smokeless tobacco: Never  Vaping Use  . Vaping status: Never Used  Substance and Sexual Activity  . Alcohol use: No    Alcohol/week: 0.0 standard drinks of alcohol  . Drug use: No  . Sexual activity: Never   Social Drivers of Corporate investment banker Strain: Patient Declined (03/23/2023)   Overall Financial Resource Strain (CARDIA)   . Difficulty of Paying Living Expenses: Patient declined  Food Insecurity: Patient Declined (03/23/2023)  Hunger Vital Sign   . Worried About Programme researcher, broadcasting/film/video in the Last Year: Patient declined   . Ran Out of Food in the Last Year: Patient declined  Transportation Needs: Patient Declined (03/23/2023)   PRAPARE - Transportation   . Lack of Transportation (Medical): Patient declined   . Lack of Transportation (Non-Medical): Patient declined  Housing Stability: Patient Declined (03/23/2023)   Housing Stability Vital Sign   . Unable to Pay for Housing in the Last Year: Patient declined   . Homeless in the Last Year: Patient declined     Family History  Problem Relation Age of Onset  . Myocardial Infarction (Heart attack) Mother   . Stroke Mother      Past Medical History:  Diagnosis Date  . Hyperlipidemia   . Hypertension    . 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 Osteoarthritis (GWK) 12/23/2013   a.  Hands    . Seasonal allergies      Recent Hospitalizations? No  Health Habits: Current exercise activities include: housecleaning Exercise: 1-2 times/week. Diet: in general, a healthy diet    How many times in the past year has patient used an illegal drug or used a prescription medication for non-medical reasons? 0 (>=1 is positive) Has the patient used opioid medication within the last year? Yes, has currently active opioid use disorder.  The pain score documented today has been reviewed with the patient? Yes Pain Information (Last Filed)     Score Location Comments Edu?   0-No pain None None None     Reviewed risk factors for opioid use disorder: no identified risk factors Opioids are currently managed by Physiatry. Reviewed plan for pain management including non-opioid treatment options: Yes  Current Medical Providers and Suppliers: [facility] Patient Care Team: Valora Lynwood FALCON, MD as PCP - General (Family Medicine) Future Appointments     Date/Time Provider Department Center Visit Type   06/27/2023 3:00 PM Valora Lynwood FALCON, MD [facility] OFFICE VISIT      dentist eye doctor Cardiology/Vascular/Physiatry/Gastro  Age-appropriate Screening Schedule: The list below includes current immunization status and future screening recommendations based on patient's age. Orders for these recommended tests are listed in the plan section. The patient has been provided with a written plan. Immunization History  Administered Date(s) Administered  . COVID-19 Moderna Vaccine (1st,2nd,3rd dose = 0.28ml) 03/14/2019, 04/11/2019, 12/29/2019  . Influenza, IM unspecified 12/09/2014, 12/10/2015  . PNEUMOCOCCAL (PCV13) (BIRTH-19YR) VACCINE (PREVNAR 13)  12/22/2017    Health Maintenance Topics with due status: Overdue     Topic Date Due   DXA Bone Density Scan Never done   Annual Urine Albumin Creatinine Ratio Never done   Adult Tetanus (Td And Tdap) Never done   Shingrix Never done   RSV Immunization Pregnant or 60+ Never done   COVID-19 Vaccine 10/30/2022   Influenza Vaccine 10/30/2022   Health Maintenance Topics with due status: Not Due     Topic Last Completion Date   Creatinine Level 03/16/2023   Potassium Level 03/16/2023   Lipid Panel 03/16/2023   Serum Calcium  03/16/2023   Diabetes Screening 03/16/2023   Depression Screening 03/23/2023   Medicare Initial or AWV 03/23/2023   Health Maintenance Topics with due status: Completed     Topic Last Completion Date   Pneumococcal Vaccine: 50+ 12/22/2017   Health Maintenance Topics with due status: Aged  Out     Topic Date Due   Hib Vaccines Aged Out   Hepatitis A Vaccines Aged Out   Meningococcal B Vaccine Aged Out   Meningococcal ACWY Vaccine Aged Out   HPV Vaccines Aged Out     Depression Screen-PHQ2/9 completed today  PHQ-2 Over the past 2 weeks, how often have you been bothered by any of the following problems? Little interest or pleasure in doing things: Not at all Feeling down, depressed, or hopeless: Not at all Patient Health Questionnaire-2 Score: 0 PHQ-2 Over the last 2 weeks, how often have you been bothered by any of the following problems? Little interest or pleasure in doing things: Not at all Feeling down, depressed, or hopeless: Not at all Patient Health Questionnaire-2 Score: 0  PHQ-9 (if PHQ >=3)    PHQ-2 Interpretation Values between 0-3 are considered not significant for depression  PHQ-9 Interpretation and Treatment Recommendations:  0-4= None  5-9= Mild / Treatment: Support, educate to call if worse; return in one month  10-14= Moderate / Treatment: Support, watchful waiting; Antidepressant or Psychotherapy  15-19= Moderately severe /  Treatment: Antidepressant OR Psychotherapy  >= 20 = Major depression, severe / Antidepressant AND Psychotherapy  Patient Health Risk Assessment questionnaire ( HRA <redacted file path>) (if patient completed in MyChart or added in flowsheet)    * No data to display          Functional Ability/Safety Screen: 1. Was the patient's timed Get Up and Go Test unsteady or longer than 30 sec? No  How to perform Timed Up and Go test (TUG): [email or web address].[email or web address]  2. Does the patient have difficulty with: Shaune index)  Bathing: No   Dressing: No Toileting: No   Transferring: No  Continence: No  Feeding: No   3. Does the home have: Rugs in the hallway: No Grab bars in the bathroom: Yes Stairs in home: Yes Handrails on the stairs: yes Poor lighting: No  Hearing Evaluation: Does the patient have trouble hearing the television when others do not? No Does the patient strain to hear/understand conversations? No  Advance Care Planning: Patient has executed an Advance Directive: no  If no, patient was given the opportunity to execute an Advance Directive today? yes  Are the patient's advanced directives in [place]? no This patient has the ability to prepare an Advance Directive: Yes Provider is willing to follow the patient's wishes: Yes  Cognitive Assessment: Cognitive screen used: no problems Results: The patient does not have any evidence of any cognitive problems and denies any change in mood/affect, appearance, speech, memory or motor skills.  Identification of Risk Factors: Risk factors include: weight , cardiovascular risk, and inactivity  Review of Systems  Objective:   Vitals:   03/23/23 1508  BP: 122/88  Pulse: 70  Weight: (!) 126.6 kg (279 lb)  Height: 157.5 cm (5' 2)   Body mass index is 51.03 kg/m. Home vitals:     BP 122/88   Pulse 70   Ht 157.5 cm (5' 2)   Wt (!) 126.6 kg (279 lb)   BMI 51.03 kg/m   General Appearance:     Alert, cooperative, no distress, appears stated age  Head:    Normocephalic, without obvious abnormality, atraumatic  Eyes:    PERRL, conjunctiva/corneas clear, EOM's intact, fundi    benign, both eyes  Ears:    Normal TM's and external ear canals, both ears  Nose:   Nares normal, septum midline, mucosa normal, no drainage  or sinus tenderness  Throat:   Lips, mucosa, and tongue normal; teeth and gums normal  Neck:   Supple, symmetrical, trachea midline, no adenopathy;    thyroid :  no enlargement/tenderness/nodules; no carotid   bruit or JVD  Back:     Symmetric, no curvature, ROM normal, no CVA tenderness  Lungs:     Clear to auscultation bilaterally, respirations unlabored  Chest Wall:    No tenderness or deformity   Heart:    Regular rate and rhythm, S1 and S2 normal, no murmur, rub   or gallop  Breast Exam:  Deferred  Abdomen:     Soft, non-tender, bowel sounds active all four quadrants,    no masses, no organomegaly  Genitalia:  Deferred  Extremities:   Extremities normal, atraumatic, no cyanosis.  Patient has swelling, increased warmth, erythema of the right wrist  Pulses:   2+ and symmetric all extremities  Skin:   Skin color, texture, turgor normal, no rashes or lesions  Lymph nodes:   Cervical, supraclavicular, and axillary nodes normal  Neurologic:   CNII-XII intact, normal strength, sensation and reflexes    throughout      Assessment/Plan:   Patient Self-Management and Personalized Health Advice The patient has been provided with information about: exercise, weight management, prevention of cardiac or vascular disease, and designing advance directives  During the course of the visit the patient was educated and counseled about appropriate screening and preventive services including:  Influenza vaccine Shingrix vaccine (Herpes Zoster)-advised to get at pharmacy due to coverage  The patient's BMI is above the acceptable range; discussed or provided materials on  diet/exercise  Diagnoses and all orders for this visit:  Medicare annual wellness visit, subsequent  Routine adult health maintenance Encouraged healthy diet, regular exercise, regular health maintenance Hypertension, unspecified type Good control, continue current medication, continue to monitor Hyperlipidemia, unspecified hyperlipidemia type Good control, continue simvastatin  Osteoarthritis, unspecified osteoarthritis type, unspecified site Unsure if this is affecting her wrist or possibly gout Carcinoma of overlapping sites of right breast in female, estrogen receptor positive (CMS/HHS-HCC) Continue to follow with surgery, oncology Atrial fibrillation, unspecified type (CMS/HHS-HCC) Currently in sinus rhythm, continue to follow with cardiology Obesity, Class III, BMI 40-49.9 (morbid obesity) (CMS/HHS-HCC) Work on diet, exercise as tolerated, weight loss Stage 3b chronic kidney disease (CMS/HHS-HCC) Some improvement, continue adequate fluids Right wrist pain -     X-ray wrist right 3 plus views; Future X-ray today, monitor Tachycardia -     CARD holter monitoring from 3 to 7 days - hook up; Future We will put a 7-day monitor on as she is seeing some racing episodes daily, has cardiology follow-up pending SOB (shortness of breath) on exertion -     CARD holter monitoring from 3 to 7 days - hook up; Future As above Other orders -     Follow up in Primary Care -     Follow up in Primary Care; Future           Follow-up     Normal Orders This Visit   Follow up in Primary Care [[id number] Custom]    Questions:   Does this order need to be coordinated with another visit or should it be hidden from the patient portal? If yes to either, the patient will need to stop by the front desk or call to schedule.: No   Who is this follow-up with?: PCP   What type of follow up is needed?: Physical   What's the reason  for follow up?:     Future Labs/Procedures Expected by Expires    Follow up in Primary Care [[id number] Custom]  06/21/2023 08/21/2023   Questions:     Does this order need to be coordinated with another visit or should it be hidden from the patient portal? If yes to either, the patient will need to stop by the front desk or call to schedule.: No   Who is this follow-up with?: Me   Can this appointment be overbooked by a scheduler?: No   What type of follow up is needed?: Office Visit   What's the reason for follow up?: Chronic Medical Problems   Allow telemedicine?: In Person Only            Future Appointments     Date/Time Provider Department Center Visit Type   06/27/2023 3:00 PM Valora Lynwood FALCON, MD [facility] OFFICE VISIT       An after visit summary was provided for the patient either in written format or through MyChart  *Some images could not be shown.

## 2023-03-24 ENCOUNTER — Encounter: Payer: Self-pay | Admitting: Student in an Organized Health Care Education/Training Program

## 2023-03-24 ENCOUNTER — Ambulatory Visit: Payer: Self-pay | Admitting: Student in an Organized Health Care Education/Training Program

## 2023-03-24 VITALS — BP 120/80 | HR 63 | Temp 98.1°F | Ht 61.5 in | Wt 279.0 lb

## 2023-03-24 DIAGNOSIS — I2694 Multiple subsegmental pulmonary emboli without acute cor pulmonale: Secondary | ICD-10-CM

## 2023-03-24 DIAGNOSIS — R0602 Shortness of breath: Secondary | ICD-10-CM

## 2023-03-24 NOTE — Progress Notes (Signed)
Assessment & Plan:   1. Shortness of breath (Primary) 2. History of PE  Presenting for the evaluation of shortness of breath with exertion following episode of intermediate high risk pulmonary embolism requiring mechanical thrombectomy.  At the time of her June admission, echocardiography showed a dilated RV with mildly elevated PASP.  She was started on anticoagulation as well as rate and rhythm control for her atrial fibrillation but continues to experience dyspnea.  I suspect symptoms are secondary to a combination of deconditioning, recent episode of pulmonary embolism, and likely atrial fibrillation.  She does not have any risk factors for obstructive lung disease. I have personally reviewed her chest CT, and there is peripheral subpleural reticulation that could represent atelectasis, and less likely ILD.  We will obtain a pulmonary function test to assess spirometry, lung volumes, and DLCO.  Should there be a precipitous drop in DLCO, will obtain high-resolution chest CT to assess for interstitial lung disease.  I will also obtain a repeat echocardiogram to reevaluate RV function and assess for group 4 WHO pulm hypertension (CTEPH). Should we evaluate for ILD, will take into consideration her history of arthritis and expand workup to include auto-immune disease. Finally, she is indicated for life long anti-coagulation (extended phase for VTE and for Afib) so long as her risk of bleeding doesn't increase.  - Pulmonary Function Test [facility] Only; Future - ECHOCARDIOGRAM COMPLETE; Future   Return in about 3 months (around 06/22/2023).  I spent 60 minutes caring for this patient today, including preparing to see the patient, obtaining a medical history , reviewing a separately obtained history, performing a medically appropriate examination and/or evaluation, counseling and educating the patient/family/caregiver, ordering medications, tests, or procedures, documenting clinical information in the  electronic health record, and independently interpreting results (not separately reported/billed) and communicating results to the patient/family/caregiver  Raechel Chute, MD [facility]   End of visit medications:  No orders of the defined types were placed in this encounter.    Current Outpatient Medications:    acetaminophen (TYLENOL) 325 MG tablet, Take 2 tablets (650 mg total) by mouth every 6 (six) hours as needed for mild pain or fever., Disp: 20 tablet, Rfl: 0   albuterol (VENTOLIN HFA) 108 (90 Base) MCG/ACT inhaler, Inhale 2 puffs into the lungs every 6 (six) hours as needed for wheezing or shortness of breath., Disp: 8 g, Rfl: 0   apixaban (ELIQUIS) 5 MG TABS tablet, Take 1 tablet by mouth twice daily, Disp: 180 tablet, Rfl: 1   calcium carbonate (OS-CAL) 600 MG TABS tablet, Take 600 mg by mouth daily., Disp: , Rfl:    cyanocobalamin 1000 MCG tablet, Take 1,000 mcg by mouth daily., Disp: , Rfl:    cyclobenzaprine (FLEXERIL) 10 MG tablet, , Disp: , Rfl:    febuxostat (ULORIC) 40 MG tablet, Take 40 mg by mouth daily., Disp: , Rfl:    furosemide (LASIX) 20 MG tablet, Take 1 tablet by mouth once daily, Disp: 90 tablet, Rfl: 0   metoprolol tartrate (LOPRESSOR) 100 MG tablet, Take 1 tablet (100 mg total) by mouth 2 (two) times daily., Disp: 180 tablet, Rfl: 1   Multiple Vitamin (MULTIVITAMIN WITH MINERALS) TABS tablet, Take 1 tablet by mouth daily., Disp: , Rfl:    pantoprazole (PROTONIX) 40 MG tablet, Take 40 mg by mouth daily., Disp: , Rfl:    simvastatin (ZOCOR) 40 MG tablet, Take 40 mg by mouth at bedtime. , Disp: , Rfl:    tamoxifen (NOLVADEX) 20  MG tablet, TAKE 1 TABLET BY MOUTH DAILY, Disp: 70 tablet, Rfl: 4   venlafaxine XR (EFFEXOR-XR) 75 MG 24 hr capsule, TAKE 1 CAPSULE BY MOUTH DAILY  WITH BREAKFAST, Disp: 90 capsule, Rfl: 3   Vitamin D, Ergocalciferol, (DRISDOL) 1.25 MG (50000 UNIT) CAPS capsule, TAKE 1 CAPSULE BY MOUTH EVERY 7  DAYS TAKE ON MONDAYS,  Disp: 15 capsule, Rfl: 2 No current facility-administered medications for this visit.  Facility-Administered Medications Ordered in Other Visits:    sodium chloride 0.9 % injection 10 mL, 10 mL, Intravenous, PRN, Choksi, Janak, MD, 10 mL at 01/27/15 1416   sodium chloride flush (NS) 0.9 % injection 10 mL, 10 mL, Intravenous, PRN, Earna Coder, MD, 10 mL at 12/10/15 1347   Subjective:   PATIENT ID: Maria Gutierrez GENDER: female DOB: 08-03-1940, MRN: [id number]  Chief Complaint  Patient presents with   Consult    Shortness of breath on exertion. Cough and wheezing.     HPI  Patient is a pleasant 83 year old female presenting to clinic for the evaluation of shortness of breath.  Patient's symptoms started around June 2024.  At that time, she was admitted to the hospital with venous thromboembolism and was found to have a pulmonary embolus.  Given RV dilation, she underwent thrombectomy with vascular surgery.  During that admission, she was also found to have atrial fibrillation and was started on metoprolol and amiodarone given rapid ventricular response.  She was discharged on apixaban which she continues to this date.  She experienced shortness of breath with exertion following this episode which has only mildly improved since.  Prior to this whole episode, she did not have any symptoms to speak of.  She did not have any shortness of breath before June nor did she have any chest pain, chest tightness, palpitations, or cough.  At this point, she experiences shortness of breath with minimal exertion as well as palpitations.  She experiences a fluttering sensation in her chest.  She does not have any cough, chest pain, chest tightness, sputum production, fevers, chills, night sweats, weight loss, or hemoptysis. She does report history of osteoarthritis and gout.  Patient has been followed closely by cardiology, with a most recent visit in December.  She has had a loop recorder placed and  continues on metoprolol, amiodarone, and Eliquis.  Pharmacological myocardial perfusion stress test noted small, mild, fixed mid inferior lateral and apical lateral defect.  Repeat CT scan of the chest did not show any residual pulmonary embolism.  Given persistent symptoms, she was referred to pulmonary for further evaluation.  Patient denies any history of smoking or any other inhalational exposure.  She does not have any occupational exposures.  Ancillary information including prior medications, full medical/surgical/family/social histories, and PFTs (when available) are listed below and have been reviewed.   Review of Systems  Constitutional:  Negative for chills, fever and weight loss.  Respiratory:  Positive for shortness of breath. Negative for cough, hemoptysis, sputum production and wheezing.   Cardiovascular:  Negative for chest pain.     Objective:   Vitals:   03/24/23 1027  BP: 120/80  Pulse: 63  Temp: 98.1 F (36.7 C)  TempSrc: Temporal  SpO2: 94%  Weight: 279 lb (126.6 kg)  Height: 5' 1.5" (1.562 m)   94% on RA BMI Readings from Last 3 Encounters:  03/24/23 51.86 kg/m  02/17/23 51.12 kg/m  12/02/22 49.14 kg/m   Wt Readings from Last 3 Encounters:  03/24/23 279 lb (126.6 kg)  02/17/23 275 lb (124.7 kg)  12/02/22 277 lb 6.4 oz (125.8 kg)    Physical Exam Constitutional:      Appearance: She is obese. She is not ill-appearing.  Cardiovascular:     Rate and Rhythm: Normal rate and regular rhythm.     Pulses: Normal pulses.     Heart sounds: Normal heart sounds.  Pulmonary:     Effort: Pulmonary effort is normal.     Breath sounds: Normal breath sounds.  Neurological:     General: No focal deficit present.     Mental Status: She is alert and oriented to person, place, and time. Mental status is at baseline.       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 extensive intermediate grade DCIS.    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   DDD (degenerative disc disease), lumbar    Dyspnea    GERD (gastroesophageal reflux disease)    Heart murmur    History of blood transfusion    Hyperlipidemia    Hypertension    Personal history of chemotherapy 2016   right breast ca   Personal history of radiation therapy 2016   mammosite   SOB (shortness of breath) 12/03/2014     Family History  Problem Relation Age of Onset   Heart disease Sister    Breast cancer Neg Hx      Past Surgical History:  Procedure Laterality Date   ABDOMINAL HYSTERECTOMY     AXILLARY LYMPH NODE BIOPSY Right 07/14/2014   Procedure: AXILLARY LYMPH NODE BIOPSY;  Surgeon: Earline Mayotte, MD;  Location: [facility] ORS;  Service: General;  Laterality: Right;   BACK SURGERY  12/29/2020   BOTOX INJECTION N/A 01/13/2020   Procedure: BOTOX INJECTION;  Surgeon: Vanna Scotland, MD;  Location: [facility] ORS;  Service: Urology;  Laterality: N/A;   BREAST BIOPSY Left 2012   benign   BREAST BIOPSY Right 05/26/2014   DCIS    BREAST EXCISIONAL BIOPSY Right 06/28/2017    ULCERATED SKIN WITH UNDERLYING FAT NECROSIS, ACUTE INFLAMMATION AND REACTIVE EPITHELIAL ATYPIA   BREAST LUMPECTOMY Right 06/10/2014   DCIS and Invasive ductal carcinoma, clear margins   BREAST SURGERY Right 06/10/2014   Wide excision for intermediate grade DCIS, identification of a 10  millimeter area of invasive mammary carcinoma.   CHOLECYSTECTOMY  2012   COLONOSCOPY  2009   EYE SURGERY Bilateral 2014   cataract   IR KYPHO EA ADDL LEVEL THORACIC OR LUMBAR  02/12/2021   JOINT REPLACEMENT Left 2001   knee   JOINT REPLACEMENT Right 2003   knee   JOINT REPLACEMENT Left 2004   replacement joint broken    kneee surgery Left    PORT-A-CATH REMOVAL  02/11/2016   Dr Lemar Livings   PORTACATH PLACEMENT Left 08/27/2014   Procedure: INSERTION PORT-A-CATH;  Surgeon: Earline Mayotte, MD;  Location: [facility] ORS;  Service: General;  Laterality: Left;   PULMONARY THROMBECTOMY Bilateral 08/02/2022   Procedure: PULMONARY THROMBECTOMY;  Surgeon: Renford Dills, MD;  Location: [facility] INVASIVE [facility];  Service: Cardiovascular;  Laterality: Bilateral;    Social History   Socioeconomic History  Marital status: Married    Spouse name: Alinda Money   Number of children: 1   Years of education: Not on file   Highest education level: Not on file  Occupational History   Occupation: house cleaner    Comment: retired  Tobacco Use   Smoking status: Never   Smokeless tobacco: Never  Substance and Sexual Activity   Alcohol use: No    Alcohol/week: 0.0 standard drinks of alcohol   Drug use: No   Sexual activity: Not Currently  Other Topics Concern   Not on file  Social History Narrative   Patient lives with husband and feels safe in her home.   Remains active, drives and is self sufficient.   Social Drivers of Health   Financial Resource Strain: Patient Declined (03/23/2023)   Received from [facility]   Overall Financial Resource Strain (CARDIA)    Difficulty of Paying Living Expenses: Patient declined  Food Insecurity: Patient Declined (03/23/2023)   Received from [facility]   Hunger Vital Sign    Worried About Running Out of Food in the Last Year: Patient declined    Ran Out of Food in the Last Year: Patient declined  Transportation Needs: Patient Declined (03/23/2023)   Received from [facility]   PRAPARE - Transportation    In the past 12 months, has lack of transportation kept you from medical appointments or from getting medications?: Patient declined    Lack of Transportation (Non-Medical): Patient declined  Physical Activity: Not on file  Stress:  Not on file  Social Connections: Not on file  Intimate Partner Violence: Not At Risk (07/31/2022)   Humiliation, Afraid, Rape, and Kick questionnaire    Fear of Current or Ex-Partner: No    Emotionally Abused: No    Physically Abused: No    Sexually Abused: No     No Known Allergies   CBC    Component Value Date/Time   WBC 12.7 (H) 02/17/2023 1202   WBC 12.1 (H) 12/02/2022 1158   RBC 4.27 02/17/2023 1202   RBC 4.31 12/02/2022 1158   HGB 13.9 02/17/2023 1202   HCT 42.2 02/17/2023 1202   PLT 223 02/17/2023 1202   MCV 99 (H) 02/17/2023 1202   MCV 94 06/05/2014 0843   MCH 32.6 02/17/2023 1202   MCH 32.5 12/02/2022 1158   MCHC 32.9 02/17/2023 1202   MCHC 33.5 12/02/2022 1158   RDW 12.1 02/17/2023 1202   RDW 13.3 06/05/2014 0843   LYMPHSABS 4.1 (H) 12/02/2022 1158   LYMPHSABS 1.8 06/05/2014 0843   MONOABS 0.9 12/02/2022 1158   MONOABS 0.8 06/05/2014 0843   EOSABS 0.2 12/02/2022 1158   EOSABS 0.1 06/05/2014 0843   BASOSABS 0.1 12/02/2022 1158   BASOSABS 0.0 06/05/2014 0843    Pulmonary Functions Testing Results:     No data to display          Outpatient Medications Prior to Visit  Medication Sig Dispense Refill   acetaminophen (TYLENOL) 325 MG tablet Take 2 tablets (650 mg total) by mouth every 6 (six) hours as needed for mild pain or fever. 20 tablet 0   albuterol (VENTOLIN HFA) 108 (90 Base) MCG/ACT inhaler Inhale 2 puffs into the lungs every 6 (six) hours as needed for wheezing or shortness of breath. 8 g 0   apixaban (ELIQUIS) 5 MG TABS tablet Take 1 tablet by mouth twice daily 180 tablet 1   calcium carbonate (OS-CAL) 600 MG TABS tablet Take 600  mg by mouth daily.     cyanocobalamin 1000 MCG tablet Take 1,000 mcg by mouth daily.     cyclobenzaprine (FLEXERIL) 10 MG tablet      febuxostat (ULORIC) 40 MG tablet Take 40 mg by mouth daily.     furosemide (LASIX) 20 MG tablet Take 1 tablet by mouth once daily 90 tablet 0   metoprolol tartrate (LOPRESSOR) 100 MG  tablet Take 1 tablet (100 mg total) by mouth 2 (two) times daily. 180 tablet 1   Multiple Vitamin (MULTIVITAMIN WITH MINERALS) TABS tablet Take 1 tablet by mouth daily.     pantoprazole (PROTONIX) 40 MG tablet Take 40 mg by mouth daily.     simvastatin (ZOCOR) 40 MG tablet Take 40 mg by mouth at bedtime.      tamoxifen (NOLVADEX) 20 MG tablet TAKE 1 TABLET BY MOUTH DAILY 70 tablet 4   venlafaxine XR (EFFEXOR-XR) 75 MG 24 hr capsule TAKE 1 CAPSULE BY MOUTH DAILY  WITH BREAKFAST 90 capsule 3   Vitamin D, Ergocalciferol, (DRISDOL) 1.25 MG (50000 UNIT) CAPS capsule TAKE 1 CAPSULE BY MOUTH EVERY 7  DAYS TAKE ON MONDAYS 15 capsule 2   Facility-Administered Medications Prior to Visit  Medication Dose Route Frequency Provider Last Rate Last Admin   sodium chloride 0.9 % injection 10 mL  10 mL Intravenous PRN Choksi, Janak, MD   10 mL at 01/27/15 1416   sodium chloride flush (NS) 0.9 % injection 10 mL  10 mL Intravenous PRN Earna Coder, MD   10 mL at 12/10/15 1347

## 2023-04-07 DIAGNOSIS — M1811 Unilateral primary osteoarthritis of first carpometacarpal joint, right hand: Secondary | ICD-10-CM | POA: Diagnosis not present

## 2023-04-07 DIAGNOSIS — M25531 Pain in right wrist: Secondary | ICD-10-CM | POA: Diagnosis not present

## 2023-04-07 DIAGNOSIS — M109 Gout, unspecified: Secondary | ICD-10-CM | POA: Diagnosis not present

## 2023-04-11 DIAGNOSIS — M5416 Radiculopathy, lumbar region: Secondary | ICD-10-CM | POA: Diagnosis not present

## 2023-04-11 DIAGNOSIS — M48062 Spinal stenosis, lumbar region with neurogenic claudication: Secondary | ICD-10-CM | POA: Diagnosis not present

## 2023-04-11 DIAGNOSIS — Z79899 Other long term (current) drug therapy: Secondary | ICD-10-CM | POA: Diagnosis not present

## 2023-04-17 ENCOUNTER — Telehealth: Payer: Self-pay | Admitting: Cardiovascular Disease

## 2023-04-17 NOTE — Telephone Encounter (Signed)
Patient wants a call back to discuss heart monitor results.

## 2023-05-10 ENCOUNTER — Encounter: Payer: Self-pay | Admitting: Internal Medicine

## 2023-05-11 ENCOUNTER — Encounter: Payer: Self-pay | Admitting: Internal Medicine

## 2023-05-11 NOTE — Progress Notes (Unsigned)
 Cardiology Office Note:  .   Date:  05/12/2023  ID:  Maria Gutierrez, DOB Mar 20, 1940, MRN [id number] PCP: Jerl Mina, MD  [facility] Providers Cardiologist:  Yvonne Kendall, MD     History of Present Illness: [name]   Maria Gutierrez is a 83 y.o. female with a hx of  HTN, obesity, depression and anxiety, CKD stage 3, PE in 07/2022, breast cancer s/p radiation and chemotherapy, and gout who present for follow-up.    The patient was admitted 07/2022 for acute PE, severe sepsis 2/2 PNA, acute respiratiry failure, AKI on CKD stage 3, and new onset Afib. She presented with SOB fount to have bilateral PE started on IV heparin. She was in sinus tach/MAT, and later on developed Afib. Patient underwent mechanical thrombectomy by [facility] on 6/4. Afib rates were uncontrolled with just metoprolol and she was started on amiodarone. HS troponin was elevated 24>25.  Echo showed LVEF 60-65%, mild to mod hypertrophjy, normal RVSF, mildly elevated pulmonary artery pressure, trivial MR, ascending aorta measuring 36mm. She was transitioned to oral amiodarone and started on Eliquis 5mg  BID. Follow-up Myoview Eugenie Birks was low risk normal from logical myocardial perfusion test with a small in size, mild in severity, fixed mid inferolateral and apical lateral defect that may represent subtle scarring cannot rule out artifact.  No ischemia noted.  EF 65%.  30 calcification and aortic atherosclerosis were noted on attenuation correction CT.   The patient was last seen 01/2023 and reported fatigue. Her inhaler was refilled and she was referred to pulmonology. She saw pulmonology and PFTs and echo were ordered. TSH came back at 69 and amiodarone was stopped.   Today, the patient reports strength is improving. She feels heart rate is very high when she walks. Heart monitor done by PCP showed no serious arrhythmias. Still feels very short of breath still when she is walking. No chest pain. She is in NSR today.   Studies Reviewed:  [name]   EKG Interpretation Date/Time:  Friday May 12 2023 11:26:07 EDT Ventricular Rate:  63 PR Interval:  204 QRS Duration:  88 QT Interval:  408 QTC Calculation: 417 R Axis:   4  Text Interpretation: Normal sinus rhythm with 1st degree A-V block Low voltage QRS When compared with ECG of 18-Nov-2022 11:57, QT has shortened Confirmed by Fransico Michael, Galvin Aversa ([id number]) on 05/12/2023 11:31:15 AM    Myoview lexiscan 08/2022    Abnormal pharmacologic myocardial perfusion stress test.   There is a small in size, mild in severity, fixed mid inferolateral and apical lateral defect that may represent subtle scar and cannot rule out artifact.   No significant ischemia is identified.   Left ventricular systolic function is normal (LVEF > 65%).   Coronary artery calcification and aortic atherosclerosis are noted on the attenuation correction CT.   This is a low-risk study.   Echo 07/2022 1. Left ventricular ejection fraction, by estimation, is 60 to 65%. The  left ventricle has normal function. Left ventricular endocardial border  not optimally defined to evaluate regional wall motion. mild to moderate  left ventricular hypertrophy. Left  ventricular diastolic function could not be evaluated.   2. Right ventricular systolic function is normal. The right ventricular  size is mildly enlarged. There is mildly elevated pulmonary artery  systolic pressure.   3. The mitral valve is grossly normal. Trivial mitral valve  regurgitation. No evidence of mitral stenosis.   4. The aortic valve is tricuspid. Aortic valve regurgitation is not  visualized. No aortic stenosis is present.   5. There is borderline dilatation of the ascending aorta, measuring 36  mm.       Physical Exam:   VS:  BP 124/67 (BP Location: Left Arm, Patient Position: Sitting, Cuff Size: Normal)   Pulse 63   Ht 5\' 2"  (1.575 m)   Wt 284 lb 12.8 oz (129.2 kg)   SpO2 95%   BMI 52.09 kg/m    Wt Readings from Last 3 Encounters:  05/12/23 284  lb 12.8 oz (129.2 kg)  03/24/23 279 lb (126.6 kg)  02/17/23 275 lb (124.7 kg)    GEN: Well nourished, well developed in no acute distress NECK: No JVD; No carotid bruits CARDIAC: RRR, no murmurs, rubs, gallops RESPIRATORY:  Clear to auscultation without rales, wheezing or rhonchi  ABDOMEN: Soft, non-tender, non-distended EXTREMITIES:  No edema; No deformity   ASSESSMENT AND PLAN: .    DOE Patient with worsening DOE for >3 months. She had a PE in June 2024 and follow-up Chest CTA 08/2022 showed no residual PE. She had MPI 08/2022 that was low risk with no ischemia. Echo at that time showed normal LVEF, mild to mod LVH. Pulmonology ordered PFTs and echo, suspected deconditioning and PE contributing to breathing issues. Heart monitor by PCP showed no serious arrhythmias. TSH also found to be elevated, with low FT4. I will order Cardiac PET stress to evaluate for ischemia. If this is unremarkable, but symptoms persist, may need R/L heart cath.   Coronary calcifications No chest pain reported. Plan for Cardiac PET stress test as above. Continue simvastatin and metoprolol.  Paroxysmal Afib She is off amiodarone d/t abnormal TSH/FT4. She remains in NSR. Continue metoprolol 100mg  BID and Eliquis mg BID.   CKD stage 3 Scr baseline around 1.2.   Abnormal thyroid TSH came back at 69 and FT4 0.56. Amiodarone was stopped. Repeat labs today. Recommended PCP follow-up.   Dispo: Follow-up in 2 months  Signed, Assia Meanor David Stall, PA-C

## 2023-05-12 ENCOUNTER — Ambulatory Visit: Payer: Medicare Other | Attending: Medical | Admitting: Medical

## 2023-05-12 ENCOUNTER — Encounter: Payer: Self-pay | Admitting: Medical

## 2023-05-12 ENCOUNTER — Encounter: Payer: Self-pay | Admitting: Internal Medicine

## 2023-05-12 ENCOUNTER — Telehealth: Payer: Self-pay

## 2023-05-12 VITALS — BP 124/67 | HR 63 | Ht 62.0 in | Wt 284.8 lb

## 2023-05-12 DIAGNOSIS — N183 Chronic kidney disease, stage 3 unspecified: Secondary | ICD-10-CM | POA: Diagnosis not present

## 2023-05-12 DIAGNOSIS — R0609 Other forms of dyspnea: Secondary | ICD-10-CM

## 2023-05-12 DIAGNOSIS — Z79899 Other long term (current) drug therapy: Secondary | ICD-10-CM

## 2023-05-12 DIAGNOSIS — R7989 Other specified abnormal findings of blood chemistry: Secondary | ICD-10-CM

## 2023-05-12 DIAGNOSIS — I480 Paroxysmal atrial fibrillation: Secondary | ICD-10-CM

## 2023-05-12 DIAGNOSIS — I2510 Atherosclerotic heart disease of native coronary artery without angina pectoris: Secondary | ICD-10-CM | POA: Diagnosis not present

## 2023-05-12 NOTE — Telephone Encounter (Signed)
 Called patient, advised that per Cadence Fransico Michael, PA-C- she would like for her to see PCP to discuss TSH-   LVM to call back to discuss.

## 2023-05-12 NOTE — Patient Instructions (Signed)
 Medication Instructions:  The current medical regimen is effective;  continue present plan and medications as directed. Please refer to the Current Medication list given to you today.   *If you need a refill on your cardiac medications before your next appointment, please call your pharmacy*   Testing/Procedures:  CARDIAC PET- Your physician has requested that you have a Cardiac Pet Stress Test.   This testing is completed at [facility] ([address] [place], [place] [address]) or [facility] ([address], [place], [place]). Please arrive 30 minutes prior to your scheduled time.  The schedulers will call you to get this scheduled. Please follow further testing instructions below.    Follow-Up: At [facility], you and your health needs are our priority.  As part of our continuing mission to provide you with exceptional heart care, we have created designated Provider Care Teams.  These Care Teams include your primary Cardiologist (physician) and Advanced Practice Providers (APPs -  Physician Assistants and Nurse Practitioners) who all work together to provide you with the care you need, when you need it.  We recommend signing up for the patient portal called "MyChart".  Sign up information is provided on this After Visit Summary.  MyChart is used to connect with patients for Virtual Visits (Telemedicine).  Patients are able to view lab/test results, encounter notes, upcoming appointments, etc.  Non-urgent messages can be sent to your provider as well.   To learn more about what you can do with MyChart, go to [email or web address].    Your next appointment:   2 month(s)  Provider:   You may see Yvonne Kendall, MD or one of the following Advanced Practice Providers on your designated Care Team:   Nicolasa Ducking, NP Eula Listen, PA-C Cadence Fransico Michael, PA-C Charlsie Quest, NP Carlos Levering, NP    Other Instructions     Please report to Radiology at the [facility] Main Entrance 30 minutes early for your test.  [address] [place], [place] [address]                         OR   Please report to Radiology at [facility] Main Entrance, medical mall, 30 mins prior to your test.  [address]  [place], [place]  How to Prepare for Your Cardiac PET/CT Stress Test:  Nothing to eat or drink, except water, 3 hours prior to arrival time.  NO caffeine/decaffeinated products, or chocolate 12 hours prior to arrival. (Please note decaffeinated beverages (teas/coffees) still contain caffeine).  If you have caffeine within 12 hours prior, the test will need to be rescheduled.  Medication instructions: Do not take erectile dysfunction medications for 72 hours prior to test (sildenafil, tadalafil) Do not take nitrates (isosorbide mononitrate, Ranexa) the day before or day of test Do not take tamsulosin the day before or morning of test Hold theophylline containing medications for 12 hours. Hold Dipyridamole 48 hours prior to the test.  Diabetic Preparation: If able to eat breakfast prior to 3 hour fasting, you may take all medications, including your insulin. Do not worry if you miss your breakfast dose of insulin - start at your next meal. If you do not eat prior to 3 hour fast-Hold all diabetes (oral and insulin) medications. Patients who wear a continuous glucose monitor MUST remove the device prior to scanning.  You may take your remaining medications with water.  NO  perfume, cologne or lotion on chest or abdomen area. FEMALES - Please avoid wearing dresses to this appointment.  Total time is 1 to 2 hours; you may want to bring reading material for the waiting time.  IF YOU THINK YOU MAY BE PREGNANT, OR ARE NURSING PLEASE INFORM THE TECHNOLOGIST.  In preparation for your appointment, medication and supplies will be purchased.  Appointment availability is limited, so  if you need to cancel or reschedule, please call the Radiology Department Scheduler at [phone] 24 hours in advance to avoid a cancellation fee of $100.00  What to Expect When you Arrive:  Once you arrive and check in for your appointment, you will be taken to a preparation room within the Radiology Department.  A technologist or Nurse will obtain your medical history, verify that you are correctly prepped for the exam, and explain the procedure.  Afterwards, an IV will be started in your arm and electrodes will be placed on your skin for EKG monitoring during the stress portion of the exam. Then you will be escorted to the PET/CT scanner.  There, staff will get you positioned on the scanner and obtain a blood pressure and EKG.  During the exam, you will continue to be connected to the EKG and blood pressure machines.  A small, safe amount of a radioactive tracer will be injected in your IV to obtain a series of pictures of your heart along with an injection of a stress agent.    After your Exam:  It is recommended that you eat a meal and drink a caffeinated beverage to counter act any effects of the stress agent.  Drink plenty of fluids for the remainder of the day and urinate frequently for the first couple of hours after the exam.  Your doctor will inform you of your test results within 7-10 business days.  For more information and frequently asked questions, please visit our website: [email or web address]  For questions about your test or how to prepare for your test, please call: Cardiac Imaging Nurse Navigators Office: [phone]

## 2023-05-15 NOTE — Telephone Encounter (Signed)
 Pt called back and advised of suggestion for pt to follow-up with PCP for TSH levels - pt verbalized understanding and reported that she would call PCP

## 2023-05-15 NOTE — Telephone Encounter (Signed)
 Patient was returning call. Please advise ?

## 2023-05-16 DIAGNOSIS — R946 Abnormal results of thyroid function studies: Secondary | ICD-10-CM | POA: Diagnosis not present

## 2023-05-30 ENCOUNTER — Ambulatory Visit: Payer: Self-pay | Attending: Student in an Organized Health Care Education/Training Program

## 2023-05-30 DIAGNOSIS — R0602 Shortness of breath: Secondary | ICD-10-CM

## 2023-05-30 LAB — ECHOCARDIOGRAM COMPLETE
AR max vel: 2.42 cm2
AV Area VTI: 2.38 cm2
AV Area mean vel: 2.32 cm2
AV Mean grad: 3 mmHg
AV Peak grad: 6 mmHg
Ao pk vel: 1.22 m/s
Area-P 1/2: 3.53 cm2
S' Lateral: 3.8 cm

## 2023-06-20 ENCOUNTER — Encounter: Payer: Self-pay | Admitting: Internal Medicine

## 2023-06-20 ENCOUNTER — Other Ambulatory Visit: Payer: Self-pay

## 2023-06-20 ENCOUNTER — Inpatient Hospital Stay: Payer: Medicare Other | Admitting: Internal Medicine

## 2023-06-20 ENCOUNTER — Inpatient Hospital Stay: Payer: Medicare Other | Attending: Internal Medicine

## 2023-06-20 ENCOUNTER — Inpatient Hospital Stay: Payer: Medicare Other

## 2023-06-20 VITALS — BP 118/75 | HR 70 | Temp 98.0°F | Resp 28 | Ht 62.0 in | Wt 293.8 lb

## 2023-06-20 DIAGNOSIS — N183 Chronic kidney disease, stage 3 unspecified: Secondary | ICD-10-CM | POA: Diagnosis not present

## 2023-06-20 DIAGNOSIS — Z7901 Long term (current) use of anticoagulants: Secondary | ICD-10-CM | POA: Diagnosis not present

## 2023-06-20 DIAGNOSIS — Z170 Estrogen receptor positive status [ER+]: Secondary | ICD-10-CM | POA: Insufficient documentation

## 2023-06-20 DIAGNOSIS — Z86711 Personal history of pulmonary embolism: Secondary | ICD-10-CM | POA: Insufficient documentation

## 2023-06-20 DIAGNOSIS — R000 Tachycardia, unspecified: Secondary | ICD-10-CM

## 2023-06-20 DIAGNOSIS — C50811 Malignant neoplasm of overlapping sites of right female breast: Secondary | ICD-10-CM | POA: Diagnosis not present

## 2023-06-20 DIAGNOSIS — R0600 Dyspnea, unspecified: Secondary | ICD-10-CM | POA: Diagnosis not present

## 2023-06-20 LAB — CMP (CANCER CENTER ONLY)
ALT: 14 U/L (ref 0–44)
AST: 23 U/L (ref 15–41)
Albumin: 3.6 g/dL (ref 3.5–5.0)
Alkaline Phosphatase: 59 U/L (ref 38–126)
Anion gap: 10 (ref 5–15)
BUN: 20 mg/dL (ref 8–23)
CO2: 28 mmol/L (ref 22–32)
Calcium: 8.9 mg/dL (ref 8.9–10.3)
Chloride: 102 mmol/L (ref 98–111)
Creatinine: 1.33 mg/dL — ABNORMAL HIGH (ref 0.44–1.00)
GFR, Estimated: 40 mL/min — ABNORMAL LOW (ref >60)
Glucose, Bld: 119 mg/dL — ABNORMAL HIGH (ref 70–99)
Potassium: 3.8 mmol/L (ref 3.5–5.1)
Sodium: 140 mmol/L (ref 135–145)
Total Bilirubin: 1.1 mg/dL (ref 0.0–1.2)
Total Protein: 6.5 g/dL (ref 6.5–8.1)

## 2023-06-20 LAB — CBC WITH DIFFERENTIAL (CANCER CENTER ONLY)
Abs Immature Granulocytes: 0.02 10*3/uL (ref 0.00–0.07)
Basophils Absolute: 0.1 10*3/uL (ref 0.0–0.1)
Basophils Relative: 1 %
Eosinophils Absolute: 0.1 10*3/uL (ref 0.0–0.5)
Eosinophils Relative: 2 %
HCT: 38.7 % (ref 36.0–46.0)
Hemoglobin: 12.7 g/dL (ref 12.0–15.0)
Immature Granulocytes: 0 %
Lymphocytes Relative: 35 %
Lymphs Abs: 2.7 10*3/uL (ref 0.7–4.0)
MCH: 33.8 pg (ref 26.0–34.0)
MCHC: 32.8 g/dL (ref 30.0–36.0)
MCV: 102.9 fL — ABNORMAL HIGH (ref 80.0–100.0)
Monocytes Absolute: 0.7 10*3/uL (ref 0.1–1.0)
Monocytes Relative: 9 %
Neutro Abs: 4 10*3/uL (ref 1.7–7.7)
Neutrophils Relative %: 53 %
Platelet Count: 184 10*3/uL (ref 150–400)
RBC: 3.76 MIL/uL — ABNORMAL LOW (ref 3.87–5.11)
RDW: 12.6 % (ref 11.5–15.5)
WBC Count: 7.6 10*3/uL (ref 4.0–10.5)
nRBC: 0 % (ref 0.0–0.2)

## 2023-06-20 LAB — BRAIN NATRIURETIC PEPTIDE: B Natriuretic Peptide: 125.2 pg/mL — ABNORMAL HIGH (ref 0.0–100.0)

## 2023-06-20 LAB — VITAMIN D 25 HYDROXY (VIT D DEFICIENCY, FRACTURES): Vit D, 25-Hydroxy: 103.92 ng/mL — ABNORMAL HIGH (ref 30–100)

## 2023-06-20 MED ORDER — ZOLEDRONIC ACID 5 MG/100ML IV SOLN
5.0000 mg | Freq: Once | INTRAVENOUS | Status: AC
  Administered 2023-06-20: 5 mg via INTRAVENOUS
  Filled 2023-06-20: qty 100

## 2023-06-20 MED ORDER — SODIUM CHLORIDE 0.9 % IV SOLN
Freq: Once | INTRAVENOUS | Status: AC
  Filled 2023-06-20: qty 250

## 2023-06-20 NOTE — Assessment & Plan Note (Addendum)
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  No evidnce of recurrence; currently on extended tamoxifen  for now for 7 year. Discontinued  tamoxifen  in OCT 2024. [ acute PE-see below]. Mammogram in August 2024- BIL WNL.  Continue surveillance at this time.  # Hot flashes grade 1-Stable. continue Effexor .   # JUNE 1st, 2024-ACUTE PE- Bilateral segmental pulmonary emboli involving the right middle lobe, right lower lobe and left lower lobe. Positive for acute PE with at least submassive (intermediate risk) PEs/p thrombectomy [Dr.Schneir]- curretly on eliquis  indefinitely [vascular]; multifactrial [age, obesity, limited mobility, tamoxifen - stopped see above]   # AUG 2023-- BMD- osteopenia- -1.4 [stable from 2018];Continue calcium  plus vitamin D  daily- stable. Proceed with Reclast .    # Hypokalmeia- 3.2; ? HCTZ; defer to PCP.  Recommend compliance with K-Dur-  stable.   # CKD stage III-stable.  # Dyspnea: currently awaiting cardiac work- defer to pulmonary, Dr.Dgyali. check BNP today- on lasix  20 mg/day=   # vaccination: ok with flu shot; ok with Covid shot  # Falls: I again counseled the patient do her best to avoid falls.'s especially given her age and also being on blood thinner.  # DISPOSITION:   # I added BNP to labs today- inform lab # ok with reclast   # follow up in 12 months- MD: labs- cbc/cmp; vit D 25-OH possible reclast - Dr.B

## 2023-06-20 NOTE — Progress Notes (Signed)
 C/o sob worse, seeing Dr. Darnelle Elders. Stress test next week. She had an EKG.  Maria Gutierrez 06/16/23 trying to vacuum, no injuries only soreness.  Dr. Dean Every xrayed knot on right hand.

## 2023-06-20 NOTE — Patient Instructions (Signed)

## 2023-06-20 NOTE — Progress Notes (Signed)
  [facility] Center OFFICE PROGRESS NOTE  Patient Care Team: Lyle San, MD as PCP - General (Family Medicine) End, Veryl Gottron, MD as PCP - Cardiology (Cardiology) Lyle San, MD as Referring Physician (Family Medicine) Marquita Situ, Magali Schmitz, MD (General Surgery) Gwyn Leos, MD as Consulting Physician (Oncology)   Cancer Staging  No matching staging information was found for the patient.    Oncology History Overview Note  1.  Carcinoma of right breast.  Status post lumpectomy and sentinel lymph node evaluation.  Tumor size 1 cm.  Estrogen and progesterone receptor positive.  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  Diagnosis in April of 2016 2.  Accelerated partial breast radiation in June of 2016 3.  Weekly Taxol  and Herceptin  therapy for adjuvant treatment starting from July of 2016; Finished herceptin  July 2017   # 5.Patient has been taken off letrozole  because of bony pain and started on tamoxifen  20 mg by mouth daily from May 12, 2015; Tamoxifen  [Hx of TAH]  # BMD sep 2018- Osteopenia- 1.3;  # STOP taxmoxifen OCT 2024- [x 7 years; Acute PE in June 2024- on eliquis ].     DIAGNOSIS: BREAST CA ER/PR/her 2 NEU POSITIVE  STAGE:  I ;GOALS: cure    Carcinoma of overlapping sites of right breast in female, estrogen receptor positive (HCC)    INTERVAL HISTORY: Walks with a cane.  Ambulating with wheel chair/cane. With family.   Maria Gutierrez 83 y.o.  female pleasant patient above history of stage I breast cancer ER PR positive HER-2/neu positive on surveillance history; history of PE June 2024 on Eliquis ; compression fractures status post kyphoplasty 2023 is here for follow-up.  Patient complains of ongoing shortness of breath.  She complains of worsening difficulty breathing with exertion.  She is currently awaiting a cardiac stress test next week.  Patient also status post evaluation with pulmonary.  Awaiting the echo and PFTs.  Of note patient had a mechanical  fell 06/16/23 trying to vacuum, no injuries only soreness. Pt major trauma.   Patient continues to complain of chronic fatigue.  Denies any nausea vomiting abdominal pain.  Patient denies any new lumps or bumps.  She notes to have gained weight from last few months.  Denies any swelling in the legs.  Review of Systems  Constitutional:  Positive for malaise/fatigue. Negative for chills, diaphoresis, fever and weight loss.  HENT:  Negative for nosebleeds and sore throat.   Eyes:  Negative for double vision.  Respiratory:  Positive for shortness of breath. Negative for cough, hemoptysis, sputum production and wheezing.   Cardiovascular:  Negative for chest pain, palpitations, orthopnea and leg swelling.  Gastrointestinal:  Negative for abdominal pain, blood in stool, constipation, diarrhea, heartburn, melena, nausea and vomiting.  Genitourinary:  Negative for dysuria, frequency and urgency.  Musculoskeletal:  Positive for back pain and joint pain.  Skin: Negative.  Negative for itching and rash.  Neurological:  Negative for dizziness, tingling, focal weakness, weakness and headaches.  Endo/Heme/Allergies:  Does not bruise/bleed easily.  Psychiatric/Behavioral:  Negative for depression. The patient is not nervous/anxious and does not have insomnia.       PAST MEDICAL HISTORY :  Past Medical History:  Diagnosis Date   Anemia    B12, d2 and magnesium  deficiencies   Anxiety    Arthritis    OSTEOARTHRITIS   Breast cancer of upper-outer quadrant of right female breast (HCC) 06/10/2014   10 mm invasive mammary carcinoma, T1b,Nx; ER 90%, 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 extensive intermediate grade  DCIS.    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   DDD (degenerative disc disease), lumbar    Dyspnea    GERD (gastroesophageal reflux disease)    Heart murmur    History of blood transfusion    Hyperlipidemia    Hypertension    Personal history of chemotherapy 2016   right  breast ca   Personal history of radiation therapy 2016   mammosite   SOB (shortness of breath) 12/03/2014    PAST SURGICAL HISTORY :   Past Surgical History:  Procedure Laterality Date   ABDOMINAL HYSTERECTOMY     AXILLARY LYMPH NODE BIOPSY Right 07/14/2014   Procedure: AXILLARY LYMPH NODE BIOPSY;  Surgeon: Marshall Skeeter, MD;  Location: [facility] ORS;  Service: General;  Laterality: Right;   BACK SURGERY  12/29/2020   BOTOX  INJECTION N/A 01/13/2020   Procedure: BOTOX  INJECTION;  Surgeon: Dustin Gimenez, MD;  Location: [facility] ORS;  Service: Urology;  Laterality: N/A;   BREAST BIOPSY Left 2012   benign   BREAST BIOPSY Right 05/26/2014   DCIS    BREAST EXCISIONAL BIOPSY Right 06/28/2017    ULCERATED SKIN WITH UNDERLYING FAT NECROSIS, ACUTE INFLAMMATION AND REACTIVE EPITHELIAL ATYPIA   BREAST LUMPECTOMY Right 06/10/2014   DCIS and Invasive ductal carcinoma, clear margins   BREAST SURGERY Right 06/10/2014   Wide excision for intermediate grade DCIS, identification of a 10  millimeter area of invasive mammary carcinoma.   CHOLECYSTECTOMY  2012   COLONOSCOPY  2009   EYE SURGERY Bilateral 2014   cataract   IR KYPHO EA ADDL LEVEL THORACIC OR LUMBAR  02/12/2021   JOINT REPLACEMENT Left 2001   knee   JOINT REPLACEMENT Right 2003   knee   JOINT REPLACEMENT Left 2004   replacement joint broken   kneee surgery Left    PORT-A-CATH REMOVAL  02/11/2016   Dr Marquita Situ   PORTACATH PLACEMENT Left 08/27/2014   Procedure: INSERTION PORT-A-CATH;  Surgeon: Marshall Skeeter, MD;  Location: [facility] ORS;  Service: General;  Laterality: Left;   PULMONARY THROMBECTOMY Bilateral 08/02/2022   Procedure: PULMONARY THROMBECTOMY;  Surgeon: Jackquelyn Mass, MD;  Location: [facility] INVASIVE CV LAB;  Service: Cardiovascular;  Laterality: Bilateral;    FAMILY HISTORY :   Family History  Problem Relation Age of Onset   Heart disease Sister    Breast cancer Neg Hx     SOCIAL HISTORY:   Social History   Tobacco  Use   Smoking status: Never   Smokeless tobacco: Never  Substance Use Topics   Alcohol use: No    Alcohol/week: 0.0 standard drinks of alcohol   Drug use: No    ALLERGIES:  has no known allergies.  MEDICATIONS:  Current Outpatient Medications  Medication Sig Dispense Refill   acetaminophen  (TYLENOL ) 325 MG tablet Take 2 tablets (650 mg total) by mouth every 6 (six) hours as needed for mild pain or fever. 20 tablet 0   albuterol  (VENTOLIN  HFA) 108 (90 Base) MCG/ACT inhaler Inhale 2 puffs into the lungs every 6 (six) hours as needed for wheezing or shortness of breath. 8 g 0   apixaban  (ELIQUIS ) 5 MG TABS tablet Take 1 tablet by mouth twice daily 180 tablet 1   calcium  carbonate (OS-CAL) 600 MG TABS tablet Take 600 mg by mouth daily.     cyclobenzaprine (FLEXERIL) 10 MG tablet      febuxostat (ULORIC) 40 MG tablet Take 40 mg by mouth daily.  furosemide  (LASIX ) 20 MG tablet Take 1 tablet by mouth once daily 90 tablet 0   metoprolol  tartrate (LOPRESSOR ) 100 MG tablet Take 1 tablet (100 mg total) by mouth 2 (two) times daily. 180 tablet 1   Multiple Vitamin (MULTIVITAMIN WITH MINERALS) TABS tablet Take 1 tablet by mouth daily.     simvastatin  (ZOCOR ) 40 MG tablet Take 40 mg by mouth at bedtime.      venlafaxine  XR (EFFEXOR -XR) 75 MG 24 hr capsule TAKE 1 CAPSULE BY MOUTH DAILY  WITH BREAKFAST 90 capsule 3   Vitamin D , Ergocalciferol , (DRISDOL ) 1.25 MG (50000 UNIT) CAPS capsule TAKE 1 CAPSULE BY MOUTH EVERY 7  DAYS TAKE ON MONDAYS 15 capsule 2   No current facility-administered medications for this visit.   Facility-Administered Medications Ordered in Other Visits  Medication Dose Route Frequency Provider Last Rate Last Admin   sodium chloride  0.9 % injection 10 mL  10 mL Intravenous PRN Choksi, Harriet Limber, MD   10 mL at 01/27/15 1416   sodium chloride  flush (NS) 0.9 % injection 10 mL  10 mL Intravenous PRN Aleria Maheu R, MD   10 mL at 12/10/15 1347    PHYSICAL EXAMINATION: ECOG  PERFORMANCE STATUS: 0 - Asymptomatic  BP 118/75 (BP Location: Left Arm, Patient Position: Sitting, Cuff Size: Normal)   Pulse 70   Temp 98 F (36.7 C) (Tympanic)   Resp (!) 28   Ht 5\' 2"  (1.575 m)   Wt 293 lb 12.8 oz (133.3 kg)   SpO2 96%   BMI 53.74 kg/m   Filed Weights   06/20/23 1310  Weight: 293 lb 12.8 oz (133.3 kg)   Physical Exam HENT:     Head: Normocephalic and atraumatic.     Mouth/Throat:     Pharynx: No oropharyngeal exudate.  Eyes:     Pupils: Pupils are equal, round, and reactive to light.  Cardiovascular:     Rate and Rhythm: Normal rate and regular rhythm.  Pulmonary:     Effort: No respiratory distress.     Breath sounds: Normal breath sounds. No wheezing.  Abdominal:     General: Bowel sounds are normal. There is no distension.     Palpations: Abdomen is soft. There is no mass.     Tenderness: There is no abdominal tenderness. There is no guarding or rebound.  Musculoskeletal:        General: No tenderness. Normal range of motion.     Cervical back: Normal range of motion and neck supple.  Skin:    General: Skin is warm.  Neurological:     Mental Status: She is alert and oriented to person, place, and time.  Psychiatric:        Mood and Affect: Affect normal.        LABORATORY DATA:  I have reviewed the data as listed    Component Value Date/Time   NA 140 06/20/2023 1303   NA 142 02/17/2023 1202   NA 141 06/05/2014 0843   K 3.8 06/20/2023 1303   K 3.5 06/05/2014 0843   CL 102 06/20/2023 1303   CL 105 06/05/2014 0843   CO2 28 06/20/2023 1303   CO2 27 06/05/2014 0843   GLUCOSE 119 (H) 06/20/2023 1303   GLUCOSE 94 06/05/2014 0843   BUN 20 06/20/2023 1303   BUN 16 02/17/2023 1202   BUN 22 (H) 06/05/2014 0843   CREATININE 1.33 (H) 06/20/2023 1303   CREATININE 1.15 (H) 02/05/2021 1018   CALCIUM  8.9 06/20/2023 1303  CALCIUM  9.2 06/05/2014 0843   PROT 6.5 06/20/2023 1303   ALBUMIN 3.6 06/20/2023 1303   AST 23 06/20/2023 1303   ALT 14  06/20/2023 1303   ALKPHOS 59 06/20/2023 1303   BILITOT 1.1 06/20/2023 1303   GFRNONAA 40 (L) 06/20/2023 1303   GFRNONAA 58 (L) 06/05/2014 0843   GFRAA 44 (L) 08/05/2019 1315   GFRAA >60 06/05/2014 0843    No results found for: "SPEP", "UPEP"  Lab Results  Component Value Date   WBC 7.6 06/20/2023   NEUTROABS 4.0 06/20/2023   HGB 12.7 06/20/2023   HCT 38.7 06/20/2023   MCV 102.9 (H) 06/20/2023   PLT 184 06/20/2023      Chemistry      Component Value Date/Time   NA 140 06/20/2023 1303   NA 142 02/17/2023 1202   NA 141 06/05/2014 0843   K 3.8 06/20/2023 1303   K 3.5 06/05/2014 0843   CL 102 06/20/2023 1303   CL 105 06/05/2014 0843   CO2 28 06/20/2023 1303   CO2 27 06/05/2014 0843   BUN 20 06/20/2023 1303   BUN 16 02/17/2023 1202   BUN 22 (H) 06/05/2014 0843   CREATININE 1.33 (H) 06/20/2023 1303   CREATININE 1.15 (H) 02/05/2021 1018      Component Value Date/Time   CALCIUM  8.9 06/20/2023 1303   CALCIUM  9.2 06/05/2014 0843   ALKPHOS 59 06/20/2023 1303   AST 23 06/20/2023 1303   ALT 14 06/20/2023 1303   BILITOT 1.1 06/20/2023 1303       RADIOGRAPHIC STUDIES: I have personally reviewed the radiological images as listed and agreed with the findings in the report. No results found.   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  No evidnce of recurrence; currently on extended tamoxifen  for now for 7 year. Discontinued  tamoxifen  in OCT 2024. [ acute PE-see below]. Mammogram in August 2024- BIL WNL.  Continue surveillance at this time.  # Hot flashes grade 1-Stable. continue Effexor .   # JUNE 1st, 2024-ACUTE PE- Bilateral segmental pulmonary emboli involving the right middle lobe, right lower lobe and left lower lobe. Positive for acute PE with at least submassive (intermediate risk) PEs/p thrombectomy  [Dr.Schneir]- curretly on eliquis  indefinitely [vascular]; multifactrial [age, obesity, limited mobility, tamoxifen - stopped see above]   # AUG 2023-- BMD- osteopenia- -1.4 [stable from 2018];Continue calcium  plus vitamin D  daily- stable. Proceed with Reclast .    # Hypokalmeia- 3.2; ? HCTZ; defer to PCP.  Recommend compliance with K-Dur-  stable.   # CKD stage III-stable.  # Dyspnea: currently awaiting cardiac work- defer to pulmonary, Dr.Dgyali. check BNP today- on lasix  20 mg/day=   # vaccination: ok with flu shot; ok with Covid shot  # Falls: I again counseled the patient do her best to avoid falls.'s especially given her age and also being on blood thinner.  # DISPOSITION:   # I added BNP to labs today- inform lab # ok with reclast   # follow up in 12 months- MD: labs- cbc/cmp; vit D 25-OH possible reclast - Dr.B    Orders Placed This Encounter  Procedures   Brain natriuretic peptide    Standing Status:   Future    Expected Date:   06/20/2023    Expiration Date:   06/19/2024   CBC with Differential (Cancer Center Only)    Standing  Status:   Future    Expected Date:   06/18/2024    Expiration Date:   06/19/2024   CMP (Cancer Center only)    Standing Status:   Future    Expected Date:   06/18/2024    Expiration Date:   06/19/2024   VITAMIN D  25 Hydroxy (Vit-D Deficiency, Fractures)    Standing Status:   Future    Expected Date:   06/18/2024    Expiration Date:   06/19/2024   All questions were answered. The patient knows to call the clinic with any problems, questions or concerns.      Gwyn Leos, MD 06/20/2023 1:55 PM

## 2023-06-22 NOTE — Progress Notes (Signed)
 Please check with patient if she is taking vit D. If she is taking vit D- recommend that she stop VitD as her levels are high.   Order repeat vit D at next visit-  Thanks GB

## 2023-06-25 ENCOUNTER — Other Ambulatory Visit: Payer: Self-pay | Admitting: Cardiovascular Disease

## 2023-06-28 ENCOUNTER — Telehealth (HOSPITAL_COMMUNITY): Payer: Self-pay | Admitting: *Deleted

## 2023-06-28 ENCOUNTER — Other Ambulatory Visit (HOSPITAL_COMMUNITY): Payer: Self-pay | Admitting: *Deleted

## 2023-06-28 DIAGNOSIS — R0609 Other forms of dyspnea: Secondary | ICD-10-CM

## 2023-06-28 NOTE — Telephone Encounter (Signed)
 Received call from patient regarding upcoming cardiac imaging study; pt verbalizes understanding of appt date/time, parking situation and where to check in, pre-test NPO status and medications ordered, and verified current allergies; name and call back number provided for further questions should they arise Johney Frame RN Navigator Cardiac Imaging [facility] [phone] office [phone] cell  Patient aware to avoid caffeine for 12 hours prior to test.

## 2023-06-28 NOTE — Telephone Encounter (Signed)
 Attempted to call patient regarding upcoming cardiac PET appointment. Left message on voicemail with name and callback number Johney Frame RN Navigator Cardiac Imaging [facility] [phone] Office  Advised to avoid caffeine for 12 hours prior to test.

## 2023-06-29 ENCOUNTER — Ambulatory Visit
Admission: RE | Admit: 2023-06-29 | Discharge: 2023-06-29 | Disposition: A | Discharge status: Home / Self Care | Source: Ambulatory Visit | Attending: Medical | Admitting: Medical

## 2023-06-29 DIAGNOSIS — R0609 Other forms of dyspnea: Secondary | ICD-10-CM | POA: Diagnosis not present

## 2023-06-29 LAB — NM PET CT CARDIAC PERFUSION MULTI W/ABSOLUTE BLOODFLOW
LV dias vol: 82 mL (ref 46–106)
LV sys vol: 20 mL
MBFR: 1.84
Nuc Rest EF: 74 %
Nuc Stress EF: 76 %
Peak HR: 91 {beats}/min
Rest HR: 87 {beats}/min
Rest MBF: 1.41 ml/g/min
Rest Nuclear Isotope Dose: 25 mCi
SRS: 0
SSS: 0
Stress MBF: 2.6 ml/g/min
Stress Nuclear Isotope Dose: 25 mCi
TID: 1.02

## 2023-06-29 MED ORDER — REGADENOSON 0.4 MG/5ML IV SOLN
INTRAVENOUS | Status: AC
  Filled 2023-06-29: qty 5

## 2023-06-29 MED ORDER — RUBIDIUM RB82 GENERATOR (RUBYFILL)
25.0000 | PACK | Freq: Once | INTRAVENOUS | Status: AC
  Administered 2023-06-29: 24.96 via INTRAVENOUS

## 2023-06-29 MED ORDER — RUBIDIUM RB82 GENERATOR (RUBYFILL)
25.0000 | PACK | Freq: Once | INTRAVENOUS | Status: AC
  Administered 2023-06-29: 24.99 via INTRAVENOUS

## 2023-06-29 MED ORDER — REGADENOSON 0.4 MG/5ML IV SOLN
0.4000 mg | Freq: Once | INTRAVENOUS | Status: AC
  Administered 2023-06-29: 0.4 mg via INTRAVENOUS
  Filled 2023-06-29: qty 5

## 2023-06-29 NOTE — Progress Notes (Signed)
 Patient presents for a cardiac PET stress test and tolerated procedure without incident. Patient maintained acceptable vital signs throughout the test and was offered caffeine after test.  Patient ambulated out of department with a steady gait with walker.

## 2023-06-30 ENCOUNTER — Telehealth: Payer: Self-pay | Admitting: Internal Medicine

## 2023-06-30 DIAGNOSIS — E039 Hypothyroidism, unspecified: Secondary | ICD-10-CM | POA: Diagnosis not present

## 2023-06-30 NOTE — Telephone Encounter (Signed)
 Left voicemail message to call back

## 2023-06-30 NOTE — Telephone Encounter (Addendum)
 Spoke with patient and she is having wheezing and is SOB. Reviewed medications with her and discussed her symptoms. Encouraged her to call her pulmonary provider but she wanted cardiology to help with this. She had appointment on the 15th but moved it up to next week. She ask if she had to suffer like this until then. Advised that I would send message to provider for her review and recommendations.   She did mention she had inhaler.

## 2023-06-30 NOTE — Telephone Encounter (Signed)
 Reviewed provider recommendations and she will reach out to them. Confirmed her appointment for next week. She verbalized understanding with no further questions at this time.     Durward Gillis, PA-C  Sent: Fri Jun 30, 2023  4:31 PM   Message  OK, well stress test looked OK. I agree with seeing pulmonology. We can see her next week and discuss further. If she feels breathing is really bad, she needs to go to the ER.

## 2023-06-30 NOTE — Telephone Encounter (Signed)
 Pt c/o Shortness Of Breath: STAT if SOB developed within the last 24 hours or pt is noticeably SOB on the phone  1. Are you currently SOB (can you hear that pt is SOB on the phone)?   Yes  2. How long have you been experiencing SOB?   Ongoing  3. Are you SOB when sitting or when up moving around?  Up  4. Are you currently experiencing any other symptoms?  No  Patient wants to know what she can do about her SOB until she has her next visit.

## 2023-06-30 NOTE — Telephone Encounter (Signed)
 Patient is returning call.

## 2023-07-04 DIAGNOSIS — I4891 Unspecified atrial fibrillation: Secondary | ICD-10-CM | POA: Diagnosis not present

## 2023-07-04 DIAGNOSIS — E039 Hypothyroidism, unspecified: Secondary | ICD-10-CM | POA: Diagnosis not present

## 2023-07-04 DIAGNOSIS — I10 Essential (primary) hypertension: Secondary | ICD-10-CM | POA: Diagnosis not present

## 2023-07-04 DIAGNOSIS — R5381 Other malaise: Secondary | ICD-10-CM | POA: Diagnosis not present

## 2023-07-04 DIAGNOSIS — R062 Wheezing: Secondary | ICD-10-CM | POA: Diagnosis not present

## 2023-07-04 DIAGNOSIS — N1832 Chronic kidney disease, stage 3b: Secondary | ICD-10-CM | POA: Diagnosis not present

## 2023-07-04 DIAGNOSIS — R5383 Other fatigue: Secondary | ICD-10-CM | POA: Diagnosis not present

## 2023-07-04 DIAGNOSIS — R7303 Prediabetes: Secondary | ICD-10-CM | POA: Diagnosis not present

## 2023-07-06 ENCOUNTER — Encounter: Payer: Self-pay | Admitting: Medical

## 2023-07-06 ENCOUNTER — Ambulatory Visit: Attending: Medical | Admitting: Medical

## 2023-07-06 ENCOUNTER — Telehealth: Payer: Self-pay | Admitting: Student in an Organized Health Care Education/Training Program

## 2023-07-06 VITALS — BP 102/72 | HR 77 | Ht 62.0 in | Wt 282.0 lb

## 2023-07-06 DIAGNOSIS — I480 Paroxysmal atrial fibrillation: Secondary | ICD-10-CM | POA: Diagnosis not present

## 2023-07-06 DIAGNOSIS — I2510 Atherosclerotic heart disease of native coronary artery without angina pectoris: Secondary | ICD-10-CM

## 2023-07-06 DIAGNOSIS — R0609 Other forms of dyspnea: Secondary | ICD-10-CM

## 2023-07-06 DIAGNOSIS — N183 Chronic kidney disease, stage 3 unspecified: Secondary | ICD-10-CM

## 2023-07-06 DIAGNOSIS — Z79899 Other long term (current) drug therapy: Secondary | ICD-10-CM | POA: Diagnosis not present

## 2023-07-06 NOTE — H&P (View-Only) (Signed)
 Cardiology Office Note:  .   Date:  07/06/2023  ID:  DAVEEN ROSENZWEIG, DOB 1940-04-18, MRN [id number] PCP: Lyle San, MD  [facility] Providers Cardiologist:  Sammy Crisp, MD     History of Present Illness: Aaron Aas   BRECK CHRISLEY is a 83 y.o. female with a hx of  HTN, obesity, depression and anxiety, CKD stage 3, PE in 07/2022, breast cancer s/p radiation and chemotherapy, and gout who present for follow-up for DOE.    The patient was admitted 07/2022 for acute PE, severe sepsis 2/2 PNA, acute respiratiry failure, AKI on CKD stage 3, and new onset Afib. She presented with SOB fount to have bilateral PE started on IV heparin . She was in sinus tach/MAT, and later on developed Afib. Patient underwent mechanical thrombectomy by [facility] on 6/4. Afib rates were uncontrolled with just metoprolol  and she was started on amiodarone . HS troponin was elevated 24>25.  Echo showed LVEF 60-65%, mild to mod hypertrophjy, normal RVSF, mildly elevated pulmonary artery pressure, trivial MR, ascending aorta measuring 36mm. She was transitioned to oral amiodarone  and started on Eliquis  5mg  BID. Follow-up Myoview  Lexiscan  was low risk normal from logical myocardial perfusion test with a small in size, mild in severity, fixed mid inferolateral and apical lateral defect that may represent subtle scarring cannot rule out artifact.  No ischemia noted.  EF 65%.  30 calcification and aortic atherosclerosis were noted on attenuation correction CT.   The patient was seen 01/2023 and reported fatigue. Her inhaler was refilled and she was referred to pulmonology. She saw pulmonology and PFTs and echo were ordered. TSH came back at 69 and amiodarone  was stopped. Thyroid  function is improving.   The patient was last seen 05/12/23 and reporting DOE. PCP did a heart monitor that showed no serious arrhythmias. Cardiac PET stress test  showed small area infarction with per-infarct ischemia, but cannot rule out artifact, intermediate risk  study.   Today, the stress test was discussed. she says she still has breathing issues, it's worse at night when she lays down.  Patient denies any chest pain.  She feels breathing is not getting any better and she would like a right and left heart cath.  This was discussed in detail today. She denies significant lower leg edema.    Studies Reviewed: .        Cardiac PET stress 06/2023   LV perfusion is abnormal. Defect 1: There is a small defect with mild reduction in uptake present in the apical inferior location(s) that is partially reversible. This suspicious for suble scar and peri-infarct ischemia but cannot rule out artifact.  There is normal wall motion in the defect area.   Rest left ventricular function is normal. Rest EF: 74%. Stress left ventricular function is normal. Stress EF: 76%. End diastolic cavity size is normal.   Myocardial blood flow was computed to be 1.69ml/g/min at rest and 2.60ml/g/min at stress. Global myocardial blood flow reserve was 1.84 and was mildly abnormal.   Coronary calcium  was present on the attenuation correction CT images. Moderate coronary calcifications were present. Coronary calcifications were present in the left anterior descending artery, left circumflex artery and right coronary artery distribution(s).   Findings are consistent with small area infarction with peri-infarct ischemia but cannot rule out artifact. The study is intermediate risk.   Electronically signed by: Sammy Crisp, MD.  Myoview  lexiscan  08/2022    Abnormal pharmacologic myocardial perfusion stress test.   There is a small in size,  mild in severity, fixed mid inferolateral and apical lateral defect that may represent subtle scar and cannot rule out artifact.   No significant ischemia is identified.   Left ventricular systolic function is normal (LVEF > 65%).   Coronary artery calcification and aortic atherosclerosis are noted on the attenuation correction CT.   This is a low-risk  study.   Echo 07/2022 1. Left ventricular ejection fraction, by estimation, is 60 to 65%. The  left ventricle has normal function. Left ventricular endocardial border  not optimally defined to evaluate regional wall motion. mild to moderate  left ventricular hypertrophy. Left  ventricular diastolic function could not be evaluated.   2. Right ventricular systolic function is normal. The right ventricular  size is mildly enlarged. There is mildly elevated pulmonary artery  systolic pressure.   3. The mitral valve is grossly normal. Trivial mitral valve  regurgitation. No evidence of mitral stenosis.   4. The aortic valve is tricuspid. Aortic valve regurgitation is not  visualized. No aortic stenosis is present.   5. There is borderline dilatation of the ascending aorta, measuring 36  mm.      Physical Exam:   VS:  BP 102/72   Pulse 77   Ht 5\' 2"  (1.575 m)   Wt 282 lb (127.9 kg)   SpO2 95%   BMI 51.58 kg/m    Wt Readings from Last 3 Encounters:  07/06/23 282 lb (127.9 kg)  06/20/23 293 lb 12.8 oz (133.3 kg)  05/12/23 284 lb 12.8 oz (129.2 kg)    GEN: Well nourished, well developed in no acute distress NECK: No JVD; No carotid bruits CARDIAC: RRR, no murmurs, rubs, gallops RESPIRATORY:  Clear to auscultation without rales, wheezing or rhonchi  ABDOMEN: Soft, non-tender, non-distended EXTREMITIES:  No edema; No deformity   ASSESSMENT AND PLAN: .    DOE Abnormal stress test Coronary artery calcifications Patient reports worsening DOE for 3 to 6 months.  She had a PE in June 2024 and follow-up chest CT in July 2024 showed no residual PE.  She had an MPI in July 2024 that was low risk with no ischemia.  Echo at that time showed normal LVEF, mild to moderate LVH.  She is also following with pulmonology who ordered PFTs and a repeat echo, they suspect deconditioning and PE contributing to breathing issues.  Heart monitor by PCP showed no serious arrhythmias.  Recent cardiac PET stress  was intermediate risk with prior infarct, possible peri-infarct versus artifact.  Patient reports unchanged symptoms.  She would like to pursue a right and a left heart cath.  Need to monitor kidney function given CKD stage III with baseline creatinine around 1.2-1.3. Need to hold Eliquis  prior to procedure.   Paroxysmal Afib Amiodarone  stopped due to abnormal TSH/free T4, which is improving.  Continue metoprolol  100 mg twice daily for rate control.  Continue Eliquis  5 5 mg twice daily for stroke prophylaxis, will need to hold prior to heart cath.  CKD stage III Serum creatinine baseline around 1.2     Informed Consent   Shared Decision Making/Informed Consent The risks [stroke (1 in 1000), death (1 in 1000), kidney failure [usually temporary] (1 in 500), bleeding (1 in 200), allergic reaction [possibly serious] (1 in 200)], benefits (diagnostic support and management of coronary artery disease) and alternatives of a cardiac catheterization were discussed in detail with Ms. Lego and she is willing to proceed.     Dispo: Follow-up in 3 weeks  Signed, Nathasha Fiorillo H  Gennaro Khat, PA-C

## 2023-07-06 NOTE — Progress Notes (Signed)
 Cardiology Office Note:  .   Date:  07/06/2023  ID:  DAVEEN ROSENZWEIG, DOB 1940-04-18, MRN [id number] PCP: Lyle San, MD  [facility] Providers Cardiologist:  Sammy Crisp, MD     History of Present Illness: Aaron Aas   BRECK CHRISLEY is a 83 y.o. female with a hx of  HTN, obesity, depression and anxiety, CKD stage 3, PE in 07/2022, breast cancer s/p radiation and chemotherapy, and gout who present for follow-up for DOE.    The patient was admitted 07/2022 for acute PE, severe sepsis 2/2 PNA, acute respiratiry failure, AKI on CKD stage 3, and new onset Afib. She presented with SOB fount to have bilateral PE started on IV heparin . She was in sinus tach/MAT, and later on developed Afib. Patient underwent mechanical thrombectomy by [facility]VS on 6/4. Afib rates were uncontrolled with just metoprolol  and she was started on amiodarone . HS troponin was elevated 24>25.  Echo showed LVEF 60-65%, mild to mod hypertrophjy, normal RVSF, mildly elevated pulmonary artery pressure, trivial MR, ascending aorta measuring 36mm. She was transitioned to oral amiodarone  and started on Eliquis  5mg  BID. Follow-up Myoview  Lexiscan  was low risk normal from logical myocardial perfusion test with a small in size, mild in severity, fixed mid inferolateral and apical lateral defect that may represent subtle scarring cannot rule out artifact.  No ischemia noted.  EF 65%.  30 calcification and aortic atherosclerosis were noted on attenuation correction CT.   The patient was seen 01/2023 and reported fatigue. Her inhaler was refilled and she was referred to pulmonology. She saw pulmonology and PFTs and echo were ordered. TSH came back at 69 and amiodarone  was stopped. Thyroid  function is improving.   The patient was last seen 05/12/23 and reporting DOE. PCP did a heart monitor that showed no serious arrhythmias. Cardiac PET stress test  showed small area infarction with per-infarct ischemia, but cannot rule out artifact, intermediate risk  study.   Today, the stress test was discussed. she says she still has breathing issues, it's worse at night when she lays down.  Patient denies any chest pain.  She feels breathing is not getting any better and she would like a right and left heart cath.  This was discussed in detail today. She denies significant lower leg edema.    Studies Reviewed: .        Cardiac PET stress 06/2023   LV perfusion is abnormal. Defect 1: There is a small defect with mild reduction in uptake present in the apical inferior location(s) that is partially reversible. This suspicious for suble scar and peri-infarct ischemia but cannot rule out artifact.  There is normal wall motion in the defect area.   Rest left ventricular function is normal. Rest EF: 74%. Stress left ventricular function is normal. Stress EF: 76%. End diastolic cavity size is normal.   Myocardial blood flow was computed to be 1.69ml/g/min at rest and 2.60ml/g/min at stress. Global myocardial blood flow reserve was 1.84 and was mildly abnormal.   Coronary calcium  was present on the attenuation correction CT images. Moderate coronary calcifications were present. Coronary calcifications were present in the left anterior descending artery, left circumflex artery and right coronary artery distribution(s).   Findings are consistent with small area infarction with peri-infarct ischemia but cannot rule out artifact. The study is intermediate risk.   Electronically signed by: Sammy Crisp, MD.  Myoview  lexiscan  08/2022    Abnormal pharmacologic myocardial perfusion stress test.   There is a small in size,  mild in severity, fixed mid inferolateral and apical lateral defect that may represent subtle scar and cannot rule out artifact.   No significant ischemia is identified.   Left ventricular systolic function is normal (LVEF > 65%).   Coronary artery calcification and aortic atherosclerosis are noted on the attenuation correction CT.   This is a low-risk  study.   Echo 07/2022 1. Left ventricular ejection fraction, by estimation, is 60 to 65%. The  left ventricle has normal function. Left ventricular endocardial border  not optimally defined to evaluate regional wall motion. mild to moderate  left ventricular hypertrophy. Left  ventricular diastolic function could not be evaluated.   2. Right ventricular systolic function is normal. The right ventricular  size is mildly enlarged. There is mildly elevated pulmonary artery  systolic pressure.   3. The mitral valve is grossly normal. Trivial mitral valve  regurgitation. No evidence of mitral stenosis.   4. The aortic valve is tricuspid. Aortic valve regurgitation is not  visualized. No aortic stenosis is present.   5. There is borderline dilatation of the ascending aorta, measuring 36  mm.      Physical Exam:   VS:  BP 102/72   Pulse 77   Ht 5\' 2"  (1.575 m)   Wt 282 lb (127.9 kg)   SpO2 95%   BMI 51.58 kg/m    Wt Readings from Last 3 Encounters:  07/06/23 282 lb (127.9 kg)  06/20/23 293 lb 12.8 oz (133.3 kg)  05/12/23 284 lb 12.8 oz (129.2 kg)    GEN: Well nourished, well developed in no acute distress NECK: No JVD; No carotid bruits CARDIAC: RRR, no murmurs, rubs, gallops RESPIRATORY:  Clear to auscultation without rales, wheezing or rhonchi  ABDOMEN: Soft, non-tender, non-distended EXTREMITIES:  No edema; No deformity   ASSESSMENT AND PLAN: .    DOE Abnormal stress test Coronary artery calcifications Patient reports worsening DOE for 3 to 6 months.  She had a PE in June 2024 and follow-up chest CT in July 2024 showed no residual PE.  She had an MPI in July 2024 that was low risk with no ischemia.  Echo at that time showed normal LVEF, mild to moderate LVH.  She is also following with pulmonology who ordered PFTs and a repeat echo, they suspect deconditioning and PE contributing to breathing issues.  Heart monitor by PCP showed no serious arrhythmias.  Recent cardiac PET stress  was intermediate risk with prior infarct, possible peri-infarct versus artifact.  Patient reports unchanged symptoms.  She would like to pursue a right and a left heart cath.  Need to monitor kidney function given CKD stage III with baseline creatinine around 1.2-1.3. Need to hold Eliquis  prior to procedure.   Paroxysmal Afib Amiodarone  stopped due to abnormal TSH/free T4, which is improving.  Continue metoprolol  100 mg twice daily for rate control.  Continue Eliquis  5 5 mg twice daily for stroke prophylaxis, will need to hold prior to heart cath.  CKD stage III Serum creatinine baseline around 1.2     Informed Consent   Shared Decision Making/Informed Consent The risks [stroke (1 in 1000), death (1 in 1000), kidney failure [usually temporary] (1 in 500), bleeding (1 in 200), allergic reaction [possibly serious] (1 in 200)], benefits (diagnostic support and management of coronary artery disease) and alternatives of a cardiac catheterization were discussed in detail with Ms. Lego and she is willing to proceed.     Dispo: Follow-up in 3 weeks  Signed, Nathasha Fiorillo H  Gennaro Khat, PA-C

## 2023-07-06 NOTE — Telephone Encounter (Signed)
 Pt is having a heart cath done on 07/11/23 and is asking if she should push back her PFT/follow up with Dr. Darnelle Elders? If so how far out should it be.

## 2023-07-06 NOTE — Patient Instructions (Signed)
 Medication Instructions:  Your Physician recommend you continue on your current medication as directed.    *If you need a refill on your cardiac medications before your next appointment, please call your pharmacy*  Lab Work: Your provider would like for you to have following labs drawn today BMeT and CBC.   If you have labs (blood work) drawn today and your tests are completely normal, you will receive your results only by: MyChart Message (if you have MyChart) OR A paper copy in the mail If you have any lab test that is abnormal or we need to change your treatment, we will call you to review the results.  Testing/Procedures:  [place] [facility] DEPT OF [place] [address] Maria Gut[address] [place] [address] Dept: [phone] Loc: [phone]  Maria Gutierrez  07/06/2023  You are scheduled for a Right and Left Cardiac Catheterization on Tuesday, May 13 with Dr. Veryl Gottron End.  1. Please arrive at the [facility] Entrance of [facility], [address] [place], [place] [address] at 7:30 AM (This is 1 hour(s) prior to your procedure time).  Proceed to the Check-In Desk directly inside the entrance.  Procedure Parking: Use the entrance off of the [address] side of the hospital. Turn right upon entering and follow the driveway to parking that is directly in front of the [facility]. There is no valet parking available at this entrance, however there is an awning directly in front of the [facility] Center for drop off/ pick up for patients.  Special note: Every effort is made to have your procedure done on time. Please understand that emergencies sometimes delay scheduled procedures.  2. Diet: Do not eat solid foods after midnight.  The patient may have clear liquids until 5am upon the day of the procedure.  3. Labs: You will need to have blood drawn today.  4. Medication instructions in  preparation for your procedure:   Contrast Allergy: No Stop taking Eliquis  (Apixiban) on Saturday, May 10.  Stop taking, Lasix  (Furosemide )  Monday, May 12,  5. Plan to go home the same day, you will only stay overnight if medically necessary. 6. Bring a current list of your medications and current insurance cards. 7. You MUST have a responsible person to drive you home. 8. Someone MUST be with you the first 24 hours after you arrive home or your discharge will be delayed. 9. Please wear clothes that are easy to get on and off and wear slip-on shoes.  Thank you for allowing us  to care for you!   -- [facility]   Follow-Up: At [facility], you and your health needs are our priority.  As part of our continuing mission to provide you with exceptional heart care, our providers are all part of one team.  This team includes your primary Cardiologist (physician) and Advanced Practice Providers or APPs (Physician Assistants and Nurse Practitioners) who all work together to provide you with the care you need, when you need it.  Your next appointment:   2 week(s) after cath on 13 May  Provider:   You may see Sammy Crisp, MD or Cadence Gennaro Khat, PA-C

## 2023-07-07 LAB — BASIC METABOLIC PANEL WITH GFR
BUN/Creatinine Ratio: 13 (ref 12–28)
BUN: 17 mg/dL (ref 8–27)
CO2: 24 mmol/L (ref 20–29)
Calcium: 9 mg/dL (ref 8.7–10.3)
Chloride: 104 mmol/L (ref 96–106)
Creatinine, Ser: 1.33 mg/dL — ABNORMAL HIGH (ref 0.57–1.00)
Glucose: 92 mg/dL (ref 70–99)
Potassium: 3.5 mmol/L (ref 3.5–5.2)
Sodium: 146 mmol/L — ABNORMAL HIGH (ref 134–144)
eGFR: 40 mL/min/{1.73_m2} — ABNORMAL LOW (ref >59)

## 2023-07-07 LAB — CBC
Hematocrit: 41.8 % (ref 34.0–46.6)
Hemoglobin: 14 g/dL (ref 11.1–15.9)
MCH: 33.7 pg — ABNORMAL HIGH (ref 26.6–33.0)
MCHC: 33.5 g/dL (ref 31.5–35.7)
MCV: 101 fL — ABNORMAL HIGH (ref 79–97)
Platelets: 240 10*3/uL (ref 150–450)
RBC: 4.15 x10E6/uL (ref 3.77–5.28)
RDW: 11.7 % (ref 11.7–15.4)
WBC: 9.6 10*3/uL (ref 3.4–10.8)

## 2023-07-10 ENCOUNTER — Telehealth: Payer: Self-pay | Admitting: *Deleted

## 2023-07-10 NOTE — Telephone Encounter (Signed)
 Appt has been rescheduled. NFN.

## 2023-07-10 NOTE — Telephone Encounter (Signed)
 Called to review patients procedure information for tomorrow morning. There was no answer on home number and voicemail box for mobile number was full.   Scheduled for 07/11/23 at 08:30 AM with arrival time of 07:30 AM.

## 2023-07-11 ENCOUNTER — Encounter: Payer: Self-pay | Admitting: Internal Medicine

## 2023-07-11 ENCOUNTER — Other Ambulatory Visit: Payer: Self-pay

## 2023-07-11 ENCOUNTER — Ambulatory Visit
Admission: RE | Admit: 2023-07-11 | Discharge: 2023-07-11 | Disposition: A | Discharge status: Home / Self Care | Attending: Internal Medicine | Admitting: Internal Medicine

## 2023-07-11 ENCOUNTER — Encounter
Admission: RE | Disposition: A | Discharge status: Home / Self Care | Payer: Self-pay | Source: Home / Self Care | Attending: Internal Medicine

## 2023-07-11 DIAGNOSIS — N1832 Chronic kidney disease, stage 3b: Secondary | ICD-10-CM | POA: Insufficient documentation

## 2023-07-11 DIAGNOSIS — F32A Depression, unspecified: Secondary | ICD-10-CM | POA: Diagnosis not present

## 2023-07-11 DIAGNOSIS — I5033 Acute on chronic diastolic (congestive) heart failure: Secondary | ICD-10-CM | POA: Diagnosis not present

## 2023-07-11 DIAGNOSIS — R0600 Dyspnea, unspecified: Secondary | ICD-10-CM | POA: Diagnosis not present

## 2023-07-11 DIAGNOSIS — R0609 Other forms of dyspnea: Secondary | ICD-10-CM | POA: Diagnosis present

## 2023-07-11 DIAGNOSIS — E669 Obesity, unspecified: Secondary | ICD-10-CM | POA: Diagnosis not present

## 2023-07-11 DIAGNOSIS — I130 Hypertensive heart and chronic kidney disease with heart failure and stage 1 through stage 4 chronic kidney disease, or unspecified chronic kidney disease: Secondary | ICD-10-CM | POA: Insufficient documentation

## 2023-07-11 DIAGNOSIS — Z7901 Long term (current) use of anticoagulants: Secondary | ICD-10-CM | POA: Insufficient documentation

## 2023-07-11 DIAGNOSIS — F419 Anxiety disorder, unspecified: Secondary | ICD-10-CM | POA: Diagnosis not present

## 2023-07-11 DIAGNOSIS — Z853 Personal history of malignant neoplasm of breast: Secondary | ICD-10-CM | POA: Insufficient documentation

## 2023-07-11 DIAGNOSIS — I2510 Atherosclerotic heart disease of native coronary artery without angina pectoris: Secondary | ICD-10-CM | POA: Insufficient documentation

## 2023-07-11 DIAGNOSIS — I272 Pulmonary hypertension, unspecified: Secondary | ICD-10-CM | POA: Diagnosis not present

## 2023-07-11 DIAGNOSIS — Z923 Personal history of irradiation: Secondary | ICD-10-CM | POA: Diagnosis not present

## 2023-07-11 DIAGNOSIS — I5032 Chronic diastolic (congestive) heart failure: Secondary | ICD-10-CM | POA: Diagnosis not present

## 2023-07-11 DIAGNOSIS — I480 Paroxysmal atrial fibrillation: Secondary | ICD-10-CM | POA: Diagnosis not present

## 2023-07-11 DIAGNOSIS — Z6841 Body Mass Index (BMI) 40.0 and over, adult: Secondary | ICD-10-CM | POA: Diagnosis not present

## 2023-07-11 DIAGNOSIS — Z79899 Other long term (current) drug therapy: Secondary | ICD-10-CM | POA: Insufficient documentation

## 2023-07-11 DIAGNOSIS — Z9221 Personal history of antineoplastic chemotherapy: Secondary | ICD-10-CM | POA: Diagnosis not present

## 2023-07-11 DIAGNOSIS — R9439 Abnormal result of other cardiovascular function study: Secondary | ICD-10-CM

## 2023-07-11 DIAGNOSIS — Z86711 Personal history of pulmonary embolism: Secondary | ICD-10-CM | POA: Insufficient documentation

## 2023-07-11 HISTORY — PX: RIGHT HEART CATH AND CORONARY ANGIOGRAPHY: CATH118264

## 2023-07-11 LAB — POCT I-STAT EG7
Acid-base deficit: 1 mmol/L (ref 0.0–2.0)
Bicarbonate: 25.6 mmol/L (ref 20.0–28.0)
Calcium, Ion: 1.15 mmol/L (ref 1.15–1.40)
HCT: 36 % (ref 36.0–46.0)
Hemoglobin: 12.2 g/dL (ref 12.0–15.0)
O2 Saturation: 55 %
Potassium: 3.4 mmol/L — ABNORMAL LOW (ref 3.5–5.1)
Sodium: 142 mmol/L (ref 135–145)
TCO2: 27 mmol/L (ref 22–32)
pCO2, Ven: 48.3 mmHg (ref 44–60)
pH, Ven: 7.332 (ref 7.25–7.43)
pO2, Ven: 31 mmHg — CL (ref 32–45)

## 2023-07-11 LAB — POCT I-STAT 7, (LYTES, BLD GAS, ICA,H+H)
Acid-base deficit: 1 mmol/L (ref 0.0–2.0)
Bicarbonate: 23.9 mmol/L (ref 20.0–28.0)
Calcium, Ion: 1.16 mmol/L (ref 1.15–1.40)
HCT: 36 % (ref 36.0–46.0)
Hemoglobin: 12.2 g/dL (ref 12.0–15.0)
O2 Saturation: 92 %
Potassium: 3.5 mmol/L (ref 3.5–5.1)
Sodium: 144 mmol/L (ref 135–145)
TCO2: 25 mmol/L (ref 22–32)
pCO2 arterial: 41.7 mmHg (ref 32–48)
pH, Arterial: 7.366 (ref 7.35–7.45)
pO2, Arterial: 65 mmHg — ABNORMAL LOW (ref 83–108)

## 2023-07-11 SURGERY — RIGHT HEART CATH AND CORONARY ANGIOGRAPHY
Anesthesia: Moderate Sedation | Laterality: Bilateral

## 2023-07-11 MED ORDER — FENTANYL CITRATE (PF) 100 MCG/2ML IJ SOLN
INTRAMUSCULAR | Status: AC
  Filled 2023-07-11: qty 2

## 2023-07-11 MED ORDER — HEPARIN (PORCINE) IN NACL 1000-0.9 UT/500ML-% IV SOLN
INTRAVENOUS | Status: DC | PRN
  Administered 2023-07-11: 1000 mL

## 2023-07-11 MED ORDER — LIDOCAINE HCL (PF) 1 % IJ SOLN
INTRAMUSCULAR | Status: DC | PRN
  Administered 2023-07-11 (×3): 2 mL

## 2023-07-11 MED ORDER — ASPIRIN 81 MG PO CHEW
CHEWABLE_TABLET | ORAL | Status: AC
  Filled 2023-07-11: qty 1

## 2023-07-11 MED ORDER — SODIUM CHLORIDE 0.9 % WEIGHT BASED INFUSION
3.0000 mL/kg/h | INTRAVENOUS | Status: AC
  Administered 2023-07-11: 3 mL/kg/h via INTRAVENOUS

## 2023-07-11 MED ORDER — SODIUM CHLORIDE 0.9 % IV SOLN: 250.0000 mL | INTRAVENOUS | Status: DC | PRN

## 2023-07-11 MED ORDER — SODIUM CHLORIDE 0.9% FLUSH: 3.0000 mL | Freq: Two times a day (BID) | INTRAVENOUS | Status: DC

## 2023-07-11 MED ORDER — SODIUM CHLORIDE 0.9 % WEIGHT BASED INFUSION: 1.0000 mL/kg/h | INTRAVENOUS | Status: DC

## 2023-07-11 MED ORDER — VERAPAMIL HCL 2.5 MG/ML IV SOLN
INTRAVENOUS | Status: AC
  Filled 2023-07-11: qty 2

## 2023-07-11 MED ORDER — FENTANYL CITRATE (PF) 100 MCG/2ML IJ SOLN
INTRAMUSCULAR | Status: DC | PRN
  Administered 2023-07-11: 12.5 ug via INTRAVENOUS

## 2023-07-11 MED ORDER — LABETALOL HCL 5 MG/ML IV SOLN: 10.0000 mg | INTRAVENOUS | Status: DC | PRN

## 2023-07-11 MED ORDER — SODIUM CHLORIDE 0.9% FLUSH: 3.0000 mL | INTRAVENOUS | Status: DC | PRN

## 2023-07-11 MED ORDER — IOHEXOL 300 MG/ML  SOLN
INTRAMUSCULAR | Status: DC | PRN
  Administered 2023-07-11: 31 mL

## 2023-07-11 MED ORDER — MIDAZOLAM HCL 2 MG/2ML IJ SOLN
INTRAMUSCULAR | Status: AC
Start: 2023-07-11 — End: ?
  Filled 2023-07-11: qty 2

## 2023-07-11 MED ORDER — FUROSEMIDE 40 MG PO TABS: 40.0000 mg | ORAL_TABLET | Freq: Every day | ORAL | 5 refills | Status: DC

## 2023-07-11 MED ORDER — ACETAMINOPHEN 325 MG PO TABS: 650.0000 mg | ORAL_TABLET | ORAL | Status: DC | PRN

## 2023-07-11 MED ORDER — HYDRALAZINE HCL 20 MG/ML IJ SOLN: 10.0000 mg | INTRAMUSCULAR | Status: DC | PRN

## 2023-07-11 MED ORDER — VERAPAMIL HCL 2.5 MG/ML IV SOLN
INTRAVENOUS | Status: DC | PRN
  Administered 2023-07-11 (×2): 2.5 mg via INTRA_ARTERIAL

## 2023-07-11 MED ORDER — HEPARIN SODIUM (PORCINE) 1000 UNIT/ML IJ SOLN
INTRAMUSCULAR | Status: DC | PRN
  Administered 2023-07-11: 5000 [IU] via INTRAVENOUS

## 2023-07-11 MED ORDER — ONDANSETRON HCL 4 MG/2ML IJ SOLN: 4.0000 mg | Freq: Four times a day (QID) | INTRAMUSCULAR | Status: DC | PRN

## 2023-07-11 MED ORDER — HEPARIN (PORCINE) IN NACL 1000-0.9 UT/500ML-% IV SOLN
INTRAVENOUS | Status: AC
  Filled 2023-07-11: qty 1000

## 2023-07-11 MED ORDER — MIDAZOLAM HCL 2 MG/2ML IJ SOLN
INTRAMUSCULAR | Status: DC | PRN
Start: 2023-07-11 — End: 2023-07-11
  Administered 2023-07-11: .5 mg via INTRAVENOUS

## 2023-07-11 MED ORDER — ASPIRIN 81 MG PO CHEW
81.0000 mg | CHEWABLE_TABLET | ORAL | Status: AC
  Administered 2023-07-11: 81 mg via ORAL

## 2023-07-11 MED ORDER — LIDOCAINE HCL 1 % IJ SOLN
INTRAMUSCULAR | Status: AC
  Filled 2023-07-11: qty 20

## 2023-07-11 MED ORDER — HEPARIN SODIUM (PORCINE) 1000 UNIT/ML IJ SOLN
INTRAMUSCULAR | Status: AC
  Filled 2023-07-11: qty 10

## 2023-07-11 SURGICAL SUPPLY — 14 items
CATH INFINITI 5FR JL4 (CATHETERS) IMPLANT
CATH INFINITI JR4 5F (CATHETERS) IMPLANT
CATH SWAN GANZ 7F STRAIGHT (CATHETERS) IMPLANT
DEVICE RAD COMP TR BAND LRG (VASCULAR PRODUCTS) IMPLANT
DRAPE BRACHIAL (DRAPES) IMPLANT
GLIDESHEATH SLEND SS 6F .021 (SHEATH) IMPLANT
GUIDEWIRE EMER 3M J .025X150CM (WIRE) IMPLANT
GUIDEWIRE INQWIRE 1.5J.035X260 (WIRE) IMPLANT
PACK CARDIAC CATH (CUSTOM PROCEDURE TRAY) ×1 IMPLANT
PAD ELECT DEFIB RADIOL ZOLL (MISCELLANEOUS) IMPLANT
SET ATX-X65L (MISCELLANEOUS) IMPLANT
SHEATH AVANTI 7FRX11 (SHEATH) IMPLANT
SHEATH GLIDE SLENDER 4/5FR (SHEATH) IMPLANT
STATION PROTECTION PRESSURIZED (MISCELLANEOUS) IMPLANT

## 2023-07-11 NOTE — Brief Op Note (Signed)
 BRIEF CARDIAC CATHETERIZATION NOTE  DATE: 07/11/2023  TIME: 11:32 AM  PATIENT:  Maria Gutierrez  83 y.o. female  PRE-OPERATIVE DIAGNOSIS:  Dyspnea on exertion and abnormal stress test  POST-OPERATIVE DIAGNOSIS:  HFpEF  PROCEDURE:  Procedure(s): RIGHT/LEFT HEART CATH AND CORONARY ANGIOGRAPHY (Bilateral)  SURGEON:  Surgeons and Role:    Sammy Crisp, MD - Primary  FINDINGS: Mild, non-obstructive CAD. Moderately to severely elevated left and right heart filling pressures. Normal to mildly reduced cardiac output/index.  RECOMMENDATIONS: Escalate diuresis. Continue optimization of chronic HFpEF. Consider AHF consultation  Sammy Crisp, MD [facility]

## 2023-07-11 NOTE — Interval H&P Note (Signed)
 History and Physical Interval Note:  07/11/2023 8:54 AM  [name]  has presented today for surgery, with the diagnosis of shortness of breath and abnormal stress test.  The various methods of treatment have been discussed with the patient and family. After consideration of risks, benefits and other options for treatment, the patient has consented to  Procedure(s): RIGHT/LEFT HEART CATH AND CORONARY ANGIOGRAPHY (Bilateral) as a surgical intervention.  The patient's history has been reviewed, patient examined, no change in status, stable for surgery.  I have reviewed the patient's chart and labs.  Questions were answered to the patient's satisfaction.    Cath Lab Visit (complete for each Cath Lab visit)  Clinical Evaluation Leading to the Procedure:   ACS: No.  Non-ACS:    Anginal/Heart Failure Classification: NYHA class III  Anti-ischemic medical therapy: Minimal Therapy (1 class of medications)  Non-Invasive Test Results: Intermediate-risk stress test findings: cardiac mortality 1-3%/year  Prior CABG: No previous CABG  Maria Gutierrez

## 2023-07-12 ENCOUNTER — Encounter: Payer: Self-pay | Admitting: Internal Medicine

## 2023-07-13 ENCOUNTER — Ambulatory Visit: Admitting: Medical

## 2023-07-14 ENCOUNTER — Encounter

## 2023-07-14 ENCOUNTER — Ambulatory Visit: Admitting: Student in an Organized Health Care Education/Training Program

## 2023-07-17 ENCOUNTER — Ambulatory Visit: Payer: Self-pay | Admitting: Medical

## 2023-07-21 ENCOUNTER — Telehealth: Payer: Self-pay | Admitting: Medical

## 2023-07-21 NOTE — Telephone Encounter (Signed)
 Unable to leave voicemail, pt needs appt on 5/28 rescheduled due to provider being out of office.

## 2023-07-26 ENCOUNTER — Ambulatory Visit: Admitting: Medical

## 2023-08-04 ENCOUNTER — Ambulatory Visit: Attending: Medical | Admitting: Medical

## 2023-08-04 ENCOUNTER — Encounter: Payer: Self-pay | Admitting: Medical

## 2023-08-04 VITALS — BP 118/78 | HR 66 | Ht 62.0 in | Wt 290.4 lb

## 2023-08-04 DIAGNOSIS — I480 Paroxysmal atrial fibrillation: Secondary | ICD-10-CM | POA: Diagnosis not present

## 2023-08-04 DIAGNOSIS — Z79899 Other long term (current) drug therapy: Secondary | ICD-10-CM

## 2023-08-04 DIAGNOSIS — I272 Pulmonary hypertension, unspecified: Secondary | ICD-10-CM

## 2023-08-04 DIAGNOSIS — I5030 Unspecified diastolic (congestive) heart failure: Secondary | ICD-10-CM | POA: Diagnosis not present

## 2023-08-04 DIAGNOSIS — N183 Chronic kidney disease, stage 3 unspecified: Secondary | ICD-10-CM

## 2023-08-04 DIAGNOSIS — R0602 Shortness of breath: Secondary | ICD-10-CM | POA: Diagnosis not present

## 2023-08-04 MED ORDER — DAPAGLIFLOZIN PROPANEDIOL 10 MG PO TABS: 10.0000 mg | ORAL_TABLET | Freq: Every day | ORAL | 3 refills | Status: DC

## 2023-08-04 NOTE — Progress Notes (Signed)
 Cardiology Office Note   Date:  08/04/2023  ID:  Maria Gutierrez, DOB 12/18/1940, MRN [id number] PCP: Lyle San, MD  [facility] Providers Cardiologist:  Sammy Crisp, MD   History of Present Illness Maria Gutierrez is a 83 y.o. female with a hx of  HTN, obesity, depression and anxiety, CKD stage 3, PE in 07/2022, breast cancer s/p radiation and chemotherapy, and gout who present for follow-up for heart catheterization   The patient was admitted 07/2022 for acute PE, severe sepsis 2/2 PNA, acute respiratiry failure, AKI on CKD stage 3, and new onset Afib. She presented with SOB fount to have bilateral PE started on IV heparin . She was in sinus tach/MAT, and later on developed Afib. Patient underwent mechanical thrombectomy by [facility]VS on 6/4. Afib rates were uncontrolled with just metoprolol  and she was started on amiodarone . HS troponin was elevated 24>25.  Echo showed LVEF 60-65%, mild to mod hypertrophjy, normal RVSF, mildly elevated pulmonary artery pressure, trivial MR, ascending aorta measuring 36mm. She was transitioned to oral amiodarone  and started on Eliquis  5mg  BID. Follow-up Myoview  Lexiscan  was low risk normal from logical myocardial perfusion test with a small in size, mild in severity, fixed mid inferolateral and apical lateral defect that may represent subtle scarring cannot rule out artifact.  No ischemia noted.  EF 65%.  30 calcification and aortic atherosclerosis were noted on attenuation correction CT.   The patient was seen 01/2023 and reported fatigue. Her inhaler was refilled and she was referred to pulmonology. She saw pulmonology and PFTs and echo were ordered. TSH came back at 69 and amiodarone  was stopped. Thyroid  function is improving.    The patient was seen 05/12/23 and reporting DOE. PCP did a heart monitor that showed no serious arrhythmias. Cardiac PET stress test  showed small area infarction with per-infarct ischemia, but cannot rule out artifact, intermediate  risk study.   She was seen back 07/06/23 reporting persistent symptoms and was set up for R/L heart cath. This showed minimal luminal irregularities in the LAD, no cignificant CAD, moderately to severely elevated left heart, right heart, and pulmonary artery pressures. Lasix  was increased to 40mg  daily.   Today, the patient reports she is still very short of breath. Heart cath was reviewed. She has been taking lasix  40mg  daily. Says she can hardly walk. She denies chest pain. No lower leg edema. Walk O2 test showed level went down to 92% and she was unable to continue.   Studies Reviewed EKG Interpretation Date/Time:  Friday August 04 2023 11:00:35 EDT Ventricular Rate:  66 PR Interval:  168 QRS Duration:  82 QT Interval:  404 QTC Calculation: 423 R Axis:   -2  Text Interpretation: Normal sinus rhythm Nonspecific ST and T wave abnormality When compared with ECG of 11-Jul-2023 08:05, PREVIOUS ECG IS PRESENT Confirmed by Gennaro Khat, Lorren Rossetti ([id number]) on 08/04/2023 11:12:08 AM    R/L heart cath 06/2023 Conclusions: Minimal luminal irregularities noted in the LAD.  No angiographically significant coronary artery disease identified. Moderately to severely elevated left heart, right heart, and pulmonary artery pressures. Normal to mildly reduced cardiac output/index (Fick CO/CI 4.9 L/min, 2.2 L/min/m^2; thermodilution CO/CI 7.1 L/min, 3.1 L/min/m^2).   Recommendations: Escalate diuresis. Consider referral to advanced heart failure team for further workup and management of HFpEF and pulmonary hypertension. Primary prevention of coronary artery disease. If no evidence of bleeding or vascular injury at catheterization sites, apixaban  can be resumed tomorrow morning.   Sammy Crisp, MD [facility]  Right Heart  Right Heart Pressures RA (mean): 15 mmHg RV (S/EDP): 70/20 mmHg PA (S/D, mean): 70/35 (47) mmHg PCWP (mean): 32 mmHg  Ao sat: 92% PA sat: 55%  Fick CO: 4.9 L/min Fick CI: 2.2  L/min/m^2  Thermodilution CO: 7.1 L/min Thermodilution CI: 3.1 L/min/m^2  PVR:  -Fick: 3.1 Wood units -Thermodilution: 2.1 Wood units   Echo 05/2023 1. Left ventricular ejection fraction, by estimation, is 55 to 60%. The  left ventricle has normal function. The left ventricle has no regional  wall motion abnormalities. Left ventricular diastolic parameters are  consistent with Grade I diastolic  dysfunction (impaired relaxation).   2. Right ventricular systolic function is normal. The right ventricular  size is normal.   3. The mitral valve is degenerative. Trivial mitral valve regurgitation.   4. The aortic valve is tricuspid. Aortic valve regurgitation is not  visualized.      Physical Exam VS:  BP 118/78   Pulse 66   Ht 5\' 2"  (1.575 m)   Wt 290 lb 6.4 oz (131.7 kg)   SpO2 96%   BMI 53.11 kg/m    Wt Readings from Last 3 Encounters:  08/04/23 290 lb 6.4 oz (131.7 kg)  07/11/23 296 lb 1.6 oz (134.3 kg)  07/06/23 282 lb (127.9 kg)    GEN: Well nourished, well developed in no acute distress NECK: No JVD; No carotid bruits CARDIAC: RRR, no murmurs, rubs, gallops RESPIRATORY:  Clear to auscultation without rales, wheezing or rhonchi  ABDOMEN: Soft, non-tender, non-distended EXTREMITIES:  No edema; No deformity   ASSESSMENT AND PLAN  SOB Chronic HFpEF Pulmonary HTN H/o PE 07/2022 Heart cath showed minimal luminal irregularities in the LAD and no significant CAD, moderate to severely elevated left heart, right heart and pulmonary artery pressures. Lasix  was increased to 40mg  daily. BMET today. She is still very short of breath, says she can hardly take a few steps before feeling SOB. O2 Walk test showed level went down to 92% and was unable to walk any more due to SOB. She may need portable O2. I will start Farxgia 10mg  daily, BMET in 2 weeks. I will refer to advanced heart failure team HTN and pulmonary HTN management.she had PFTS and pulmonary follow-up next month. .    Paroxysmal Afib Amiodarone  stopped due to abnormal TSH/free T4, which has improved, but is not normal. Continue Eliquis  for storke ppx.   CKD stage 3 BMET as above.      Dispo: Follow-up in 3 months  Signed, Devaney Segers Rebekah Canada, PA-C

## 2023-08-04 NOTE — Patient Instructions (Signed)
 Medication Instructions:  Your physician recommends the following medication changes.  START TAKING: Farxiga 10 mg by mouth daily  *If you need a refill on your cardiac medications before your next appointment, please call your pharmacy*  Lab Work: Your provider would like for you to have following labs drawn today (BMP)  Your provider would like for you to return in 2 weeks to have the following labs drawn: (BMP).   Please go to [facility] [address] ([facility] [address], [place] [address] You do not need an appointment.  They are open from 8 am- 4:30 pm.  Lunch from 1:00 pm- 2:00 pm You will not need to be fasting. .    Testing/Procedures: No test ordered today   Follow-Up: At [facility], you and your health needs are our priority.  As part of our continuing mission to provide you with exceptional heart care, our providers are all part of one team.  This team includes your primary Cardiologist (physician) and Advanced Practice Providers or APPs (Physician Assistants and Nurse Practitioners) who all work together to provide you with the care you need, when you need it.  Your next appointment:   3 month(s)  Provider:   You may see Sammy Crisp, MD or one of the following Advanced Practice Providers on your designated Care Team:    Cadence Knobel, PA-C

## 2023-08-05 LAB — BASIC METABOLIC PANEL WITH GFR
BUN/Creatinine Ratio: 13 (ref 12–28)
BUN: 17 mg/dL (ref 8–27)
CO2: 26 mmol/L (ref 20–29)
Calcium: 9 mg/dL (ref 8.7–10.3)
Chloride: 101 mmol/L (ref 96–106)
Creatinine, Ser: 1.35 mg/dL — ABNORMAL HIGH (ref 0.57–1.00)
Glucose: 81 mg/dL (ref 70–99)
Potassium: 3.9 mmol/L (ref 3.5–5.2)
Sodium: 143 mmol/L (ref 134–144)
eGFR: 39 mL/min/{1.73_m2} — ABNORMAL LOW (ref >59)

## 2023-08-07 ENCOUNTER — Ambulatory Visit: Payer: Self-pay

## 2023-08-09 DIAGNOSIS — M48062 Spinal stenosis, lumbar region with neurogenic claudication: Secondary | ICD-10-CM | POA: Diagnosis not present

## 2023-08-09 DIAGNOSIS — M5416 Radiculopathy, lumbar region: Secondary | ICD-10-CM | POA: Diagnosis not present

## 2023-08-22 DIAGNOSIS — Z79899 Other long term (current) drug therapy: Secondary | ICD-10-CM | POA: Diagnosis not present

## 2023-08-22 DIAGNOSIS — E039 Hypothyroidism, unspecified: Secondary | ICD-10-CM | POA: Diagnosis not present

## 2023-08-23 ENCOUNTER — Encounter: Admitting: Cardiology

## 2023-08-23 LAB — BASIC METABOLIC PANEL WITH GFR
BUN/Creatinine Ratio: 12 (ref 12–28)
BUN: 18 mg/dL (ref 8–27)
CO2: 27 mmol/L (ref 20–29)
Calcium: 9.3 mg/dL (ref 8.7–10.3)
Chloride: 99 mmol/L (ref 96–106)
Creatinine, Ser: 1.45 mg/dL — ABNORMAL HIGH (ref 0.57–1.00)
Glucose: 64 mg/dL — ABNORMAL LOW (ref 70–99)
Potassium: 4.1 mmol/L (ref 3.5–5.2)
Sodium: 142 mmol/L (ref 134–144)
eGFR: 36 mL/min/{1.73_m2} — ABNORMAL LOW (ref >59)

## 2023-08-29 ENCOUNTER — Other Ambulatory Visit: Payer: Self-pay

## 2023-08-29 DIAGNOSIS — R0602 Shortness of breath: Secondary | ICD-10-CM

## 2023-08-30 ENCOUNTER — Encounter

## 2023-08-30 ENCOUNTER — Encounter: Payer: Self-pay | Admitting: Student in an Organized Health Care Education/Training Program

## 2023-08-30 ENCOUNTER — Encounter: Payer: Self-pay | Admitting: Internal Medicine

## 2023-08-30 ENCOUNTER — Ambulatory Visit (INDEPENDENT_AMBULATORY_CARE_PROVIDER_SITE_OTHER): Admitting: Student in an Organized Health Care Education/Training Program

## 2023-08-30 ENCOUNTER — Ambulatory Visit: Admitting: Student in an Organized Health Care Education/Training Program

## 2023-08-30 VITALS — BP 126/80 | HR 79 | Temp 97.3°F | Ht 62.0 in | Wt 292.0 lb

## 2023-08-30 DIAGNOSIS — I2699 Other pulmonary embolism without acute cor pulmonale: Secondary | ICD-10-CM

## 2023-08-30 DIAGNOSIS — Z87891 Personal history of nicotine dependence: Secondary | ICD-10-CM

## 2023-08-30 DIAGNOSIS — I2694 Multiple subsegmental pulmonary emboli without acute cor pulmonale: Secondary | ICD-10-CM | POA: Diagnosis not present

## 2023-08-30 DIAGNOSIS — I272 Pulmonary hypertension, unspecified: Secondary | ICD-10-CM

## 2023-08-30 DIAGNOSIS — I5032 Chronic diastolic (congestive) heart failure: Secondary | ICD-10-CM | POA: Diagnosis not present

## 2023-08-30 DIAGNOSIS — R0602 Shortness of breath: Secondary | ICD-10-CM

## 2023-08-30 NOTE — Progress Notes (Signed)
 Assessment & Plan:   #SOB (shortness of breath) #History of PE #HFpEF #Pulmonary Hypertension  Presents for follow up of exertional dyspnea following intermediate high risk PE requiring mechanical thrombectomy in June of 2024. Echo at that time showed a dilated RV with elevated PASP, with repeat TTE in April of 2025 showing normalization of RV size and function. She is on anti-coagulation with Apixaban  (for afib and PE).  She has had a right heart cath that showed decompensated heart failure, with significantly elevated PCWP. While I suspect her symptoms are mostly driven by HFpEF, consideration is given to other etiologies. 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 Deconditioning secondary to PE and Afib could also be contributing to her symptoms. We are unable to rule out obstructive lung disease with her inability to perform pulmonary function testing.  I have personally reviewed her chest CT, and there is peripheral subpleural reticulation that could represent atelectasis, and less likely ILD. Given we aren't able to obtain PFT's to measure her DLCO, and the finding of mild rales on exam, I will obtain a high resolution chest CT to rule out interstitial lung disease (given arthritis, previous amiodarone  use) as this could also contribute to group 3 pHTN; I will also send for an auto-immune panel today.   Finally, she is indicated for life long anti-coagulation (extended phase for VTE and for Afib) so long as her risk of bleeding doesn't increase.  #OSA  Given signs suggestive of OSA (Sleepiness, snoring, increased neck circumference, oropharyngeal crowding), I will test for sleep apnea with a home sleep study.   - Home sleep test; Future - ANA 12 Plus Profile (RDL) - CT CHEST HIGH RESOLUTION; Future - ANCA Profile - Anti-CCP Ab, IgG + IgA (RDL) -  Rheumatoid factor  Return in about 3 months (around 11/30/2023).  I spent 33 minutes caring for this patient today, including preparing to see the patient, obtaining a medical history , reviewing a separately obtained history, performing a medically appropriate examination and/or evaluation, counseling and educating the patient/family/caregiver, ordering medications, tests, or procedures, documenting clinical information in the electronic health record, and independently interpreting results (not separately reported/billed) and communicating results to the patient/family/caregiver  Belva November, MD [facility]  End of visit medications:  No orders of the defined types were placed in this encounter.    Current Outpatient Medications:    acetaminophen  (TYLENOL ) 325 MG tablet, Take 2 tablets (650 mg total) by mouth every 6 (six) hours as needed for mild pain or fever. (Patient taking differently: Take 1,000 mg by mouth every 6 (six) hours as needed for mild pain (pain score 1-3) or fever.), Disp: 20 tablet, Rfl: 0   albuterol  (VENTOLIN  HFA) 108 (90 Base) MCG/ACT inhaler, Inhale 2 puffs into the lungs every 6 (six) hours as needed for wheezing or shortness of breath., Disp: 8 g, Rfl: 0   apixaban  (ELIQUIS ) 5 MG TABS tablet, Take 1 tablet by mouth twice daily, Disp: 180 tablet, Rfl: 1   calcium  carbonate (OS-CAL) 600 MG TABS tablet, Take 600 mg by mouth daily., Disp: , Rfl:    cyclobenzaprine (FLEXERIL) 10 MG tablet, Take 10 mg by mouth at bedtime as needed for muscle spasms., Disp: , Rfl:    dapagliflozin  propanediol (FARXIGA ) 10 MG TABS tablet, Take 1 tablet (10 mg total) by mouth daily before  breakfast., Disp: 90 tablet, Rfl: 3   fluticasone  (FLONASE ) 50 MCG/ACT nasal spray, Place 1 spray into both nostrils daily as needed for rhinitis., Disp: , Rfl:    furosemide  (LASIX ) 40 MG tablet, Take 1 tablet (40 mg total) by mouth daily., Disp: 30 tablet, Rfl: 5   levothyroxine  (SYNTHROID) 112 MCG tablet, Take 112 mcg by mouth daily before breakfast., Disp: , Rfl:    metoprolol  tartrate (LOPRESSOR ) 100 MG tablet, Take 1 tablet (100 mg total) by mouth 2 (two) times daily., Disp: 180 tablet, Rfl: 1   Multiple Vitamin (MULTIVITAMIN WITH MINERALS) TABS tablet, Take 1 tablet by mouth daily., Disp: , Rfl:    simvastatin  (ZOCOR ) 40 MG tablet, Take 40 mg by mouth at bedtime. , Disp: , Rfl:    venlafaxine  XR (EFFEXOR -XR) 75 MG 24 hr capsule, TAKE 1 CAPSULE BY MOUTH DAILY  WITH BREAKFAST (Patient taking differently: Take 150 mg by mouth daily with breakfast.), Disp: 90 capsule, Rfl: 3   Vitamin D , Ergocalciferol , (DRISDOL ) 1.25 MG (50000 UNIT) CAPS capsule, TAKE 1 CAPSULE BY MOUTH EVERY 7  DAYS TAKE ON MONDAYS, Disp: 15 capsule, Rfl: 2 No current facility-administered medications for this visit.  Facility-Administered Medications Ordered in Other Visits:    sodium chloride  0.9 % injection 10 mL, 10 mL, Intravenous, PRN, Choksi, Janak, MD, 10 mL at 01/27/15 1416   sodium chloride  flush (NS) 0.9 % injection 10 mL, 10 mL, Intravenous, PRN, Brahmanday, Govinda R, MD, 10 mL at 12/10/15 1347   Subjective:   PATIENT ID: Maria Gutierrez: female DOB: 07-10-1940, MRN: [id number]  Chief Complaint  Patient presents with   Follow-up    DOE. Wheezing. Cough, dry.     HPI  Patient is a pleasant 83 year old female presenting for follow up of shortness of breath.  Patient's symptoms started around June 2024.  At that time, she was admitted to the hospital with venous thromboembolism and was found to have a pulmonary embolus.  Given RV dilation, she underwent thrombectomy with vascular surgery.  During that admission, she was also found to have atrial fibrillation and was started on metoprolol  and amiodarone  given rapid ventricular response.  She was discharged on apixaban .   Initial Visit 03/24/2023: She experienced shortness of breath with exertion following this episode which has  only mildly improved since.  Prior to this episode, she did not have any symptoms to speak of.  She did not have any shortness of breath before June 2024 nor did she have any chest pain, chest tightness, palpitations, or cough.  At this point, she experiences shortness of breath with minimal exertion as well as palpitations.  She experiences a fluttering sensation in her chest.  She does not have any cough, chest pain, chest tightness, sputum production, fevers, chills, night sweats, weight loss, or hemoptysis. She does report history of osteoarthritis and gout.   Patient has been followed closely by cardiology, with a most recent visit in December 2024.  She has had a loop recorder placed and continues on metoprolol , amiodarone , and Eliquis .  Pharmacological myocardial perfusion stress test noted small, mild, fixed mid inferior lateral and apical lateral defect.  Repeat CT scan of the chest did not show any residual pulmonary embolism.  Given persistent symptoms, she was referred to pulmonary for further evaluation.  Return Visit 08/30/2023: She was unable to get her PFT's done today and attempt was aborted. She continues to experience similar symptoms with shortness of breath as well as a cough.  She reports snoring at night and awakes tired. She did undergo LHC and RHC in May of 2025.   RHC 07/11/2023: RA 15, RV 70/20, PA 70/35 (47), PCWP 32, CO/CI 4.9/2.2, PVR 3.1 wood.   Patient denies any history of smoking or any other inhalational exposure.  She does not have any occupational exposures.  Ancillary information including prior medications, full medical/surgical/family/social histories, and PFTs (when available) are listed below and have been reviewed.   Review of Systems  Constitutional:  Negative for chills, fever and weight loss.  Respiratory:  Positive for cough and shortness of breath. Negative for hemoptysis, sputum production and wheezing.   Cardiovascular:  Negative for chest pain.      Objective:   Vitals:   08/30/23 1524  BP: 126/80  Pulse: 79  Temp: (!) 97.3 F (36.3 C)  SpO2: 93%  Weight: 292 lb (132.5 kg)  Height: 5' 2 (1.575 m)   93% on RA BMI Readings from Last 3 Encounters:  08/30/23 53.41 kg/m  08/30/23 54.39 kg/m  08/04/23 53.11 kg/m   Wt Readings from Last 3 Encounters:  08/30/23 292 lb (132.5 kg)  08/30/23 292 lb 9.6 oz (132.7 kg)  08/04/23 290 lb 6.4 oz (131.7 kg)    Physical Exam Constitutional:      Appearance: She is obese. She is not ill-appearing.  HENT:     Mouth/Throat:     Comments: Crowded oropharynx Neck:     Comments: Increased neck circumference Cardiovascular:     Rate and Rhythm: Normal rate and regular rhythm.     Pulses: Normal pulses.     Heart sounds: Normal heart sounds.  Pulmonary:     Effort: Pulmonary effort is normal.     Breath sounds: Rales (mild bibasilar) present.  Neurological:     General: No focal deficit present.     Mental Status: She is alert and oriented to person, place, and time. Mental status is at baseline.       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 extensive intermediate grade DCIS.    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   DDD (degenerative disc disease), lumbar    Dyspnea    GERD (gastroesophageal reflux disease)    Heart murmur    History of blood transfusion    Hyperlipidemia    Hypertension    Personal history of chemotherapy 2016   right breast ca   Personal history of radiation therapy 2016   mammosite   SOB (shortness of breath) 12/03/2014     Family History  Problem Relation Age of Onset   Heart disease Sister    Breast cancer Neg Hx      Past  Surgical History:  Procedure Laterality Date   ABDOMINAL HYSTERECTOMY     AXILLARY LYMPH NODE BIOPSY Right 07/14/2014   Procedure: AXILLARY LYMPH NODE BIOPSY;  Surgeon: Reyes LELON Cota, MD;  Location: [facility] ORS;  Service: General;  Laterality: Right;   BACK SURGERY  12/29/2020   BOTOX  INJECTION N/A 01/13/2020   Procedure: BOTOX  INJECTION;  Surgeon: Penne Knee, MD;  Location: [facility] ORS;  Service: Urology;  Laterality: N/A;   BREAST BIOPSY  Left 2012   benign   BREAST BIOPSY Right 05/26/2014   DCIS    BREAST EXCISIONAL BIOPSY Right 06/28/2017    ULCERATED SKIN WITH UNDERLYING FAT NECROSIS, ACUTE INFLAMMATION AND REACTIVE EPITHELIAL ATYPIA   BREAST LUMPECTOMY Right 06/10/2014   DCIS and Invasive ductal carcinoma, clear margins   BREAST SURGERY Right 06/10/2014   Wide excision for intermediate grade DCIS, identification of a 10  millimeter area of invasive mammary carcinoma.   CHOLECYSTECTOMY  2012   COLONOSCOPY  2009   EYE SURGERY Bilateral 2014   cataract   IR KYPHO EA ADDL LEVEL THORACIC OR LUMBAR  02/12/2021   JOINT REPLACEMENT Left 2001   knee   JOINT REPLACEMENT Right 2003   knee   JOINT REPLACEMENT Left 2004   replacement joint broken   kneee surgery Left    PORT-A-CATH REMOVAL  02/11/2016   Dr Dessa   PORTACATH PLACEMENT Left 08/27/2014   Procedure: INSERTION PORT-A-CATH;  Surgeon: Reyes LELON Dessa, MD;  Location: [facility] ORS;  Service: General;  Laterality: Left;   PULMONARY THROMBECTOMY Bilateral 08/02/2022   Procedure: PULMONARY THROMBECTOMY;  Surgeon: Jama Cordella MATSU, MD;  Location: [facility] INVASIVE CV LAB;  Service: Cardiovascular;  Laterality: Bilateral;   RIGHT HEART CATH AND CORONARY ANGIOGRAPHY Bilateral 07/11/2023   Procedure: RIGHT HEART CATH AND CORONARY ANGIOGRAPHY;  Surgeon: Mady Bruckner, MD;  Location: [facility] INVASIVE CV LAB;  Service: Cardiovascular;  Laterality: Bilateral;    Social History   Socioeconomic History   Marital status: Married    Spouse  name: Seena   Number of children: 1   Years of education: Not on file   Highest education level: Not on file  Occupational History   Occupation: house cleaner    Comment: retired  Tobacco Use   Smoking status: Never   Smokeless tobacco: Never  Substance and Sexual Activity   Alcohol use: No    Alcohol/week: 0.0 standard drinks of alcohol   Drug use: No   Sexual activity: Not Currently  Other Topics Concern   Not on file  Social History Narrative   Patient lives with husband and feels safe in her home.   Remains active, drives and is self sufficient.   Social Drivers of Corporate investment banker Strain: Low Risk  (04/07/2023)   Received from [facility]   Overall Financial Resource Strain (CARDIA)    Difficulty of Paying Living Expenses: Not very hard  Food Insecurity: No Food Insecurity (04/07/2023)   [facility] from [facility]   Hunger Vital Sign    Within the past 12 months, you worried that your food would run out before you got the money to buy more.: Never true    Within the past 12 months, the food you bought just didn't last and you didn't have money to get more.: Never true  Transportation Needs: No Transportation Needs (04/07/2023)   Received from [facility] - Transportation    In the past 12 months, has lack of transportation kept you from medical appointments or from getting medications?: No    Lack of Transportation (Non-Medical): No  Physical Activity: Not on file  Stress: Not on file  Social Connections: Not on file  Intimate Partner Violence: Not At Risk (07/31/2022)   Humiliation, Afraid, Rape, and Kick questionnaire    Fear of Current or Ex-Partner: No    Emotionally Abused: No    Physically Abused: No    Sexually Abused: No  No Known Allergies   CBC    Component Value Date/Time   WBC 9.6 07/06/2023 1559   WBC 7.6 06/20/2023 1303   WBC 12.1 (H) 12/02/2022 1158   RBC 4.15 07/06/2023  1559   RBC 3.76 (L) 06/20/2023 1303   HGB 12.2 07/11/2023 0947   HGB 14.0 07/06/2023 1559   HCT 36.0 07/11/2023 0947   HCT 41.8 07/06/2023 1559   PLT 240 07/06/2023 1559   MCV 101 (H) 07/06/2023 1559   MCV 94 06/05/2014 0843   MCH 33.7 (H) 07/06/2023 1559   MCH 33.8 06/20/2023 1303   MCHC 33.5 07/06/2023 1559   MCHC 32.8 06/20/2023 1303   RDW 11.7 07/06/2023 1559   RDW 13.3 06/05/2014 0843   LYMPHSABS 2.7 06/20/2023 1303   LYMPHSABS 1.8 06/05/2014 0843   MONOABS 0.7 06/20/2023 1303   MONOABS 0.8 06/05/2014 0843   EOSABS 0.1 06/20/2023 1303   EOSABS 0.1 06/05/2014 0843   BASOSABS 0.1 06/20/2023 1303   BASOSABS 0.0 06/05/2014 0843    Pulmonary Functions Testing Results:     No data to display          Outpatient Medications Prior to Visit  Medication Sig Dispense Refill   acetaminophen  (TYLENOL ) 325 MG tablet Take 2 tablets (650 mg total) by mouth every 6 (six) hours as needed for mild pain or fever. (Patient taking differently: Take 1,000 mg by mouth every 6 (six) hours as needed for mild pain (pain score 1-3) or fever.) 20 tablet 0   albuterol  (VENTOLIN  HFA) 108 (90 Base) MCG/ACT inhaler Inhale 2 puffs into the lungs every 6 (six) hours as needed for wheezing or shortness of breath. 8 g 0   apixaban  (ELIQUIS ) 5 MG TABS tablet Take 1 tablet by mouth twice daily 180 tablet 1   calcium  carbonate (OS-CAL) 600 MG TABS tablet Take 600 mg by mouth daily.     cyclobenzaprine (FLEXERIL) 10 MG tablet Take 10 mg by mouth at bedtime as needed for muscle spasms.     dapagliflozin  propanediol (FARXIGA ) 10 MG TABS tablet Take 1 tablet (10 mg total) by mouth daily before breakfast. 90 tablet 3   fluticasone  (FLONASE ) 50 MCG/ACT nasal spray Place 1 spray into both nostrils daily as needed for rhinitis.     furosemide  (LASIX ) 40 MG tablet Take 1 tablet (40 mg total) by mouth daily. 30 tablet 5   levothyroxine (SYNTHROID) 112 MCG tablet Take 112 mcg by mouth daily before breakfast.      metoprolol  tartrate (LOPRESSOR ) 100 MG tablet Take 1 tablet (100 mg total) by mouth 2 (two) times daily. 180 tablet 1   Multiple Vitamin (MULTIVITAMIN WITH MINERALS) TABS tablet Take 1 tablet by mouth daily.     simvastatin  (ZOCOR ) 40 MG tablet Take 40 mg by mouth at bedtime.      venlafaxine  XR (EFFEXOR -XR) 75 MG 24 hr capsule TAKE 1 CAPSULE BY MOUTH DAILY  WITH BREAKFAST (Patient taking differently: Take 150 mg by mouth daily with breakfast.) 90 capsule 3   Vitamin D , Ergocalciferol , (DRISDOL ) 1.25 MG (50000 UNIT) CAPS capsule TAKE 1 CAPSULE BY MOUTH EVERY 7  DAYS TAKE ON MONDAYS 15 capsule 2   Facility-Administered Medications Prior to Visit  Medication Dose Route Frequency Provider Last Rate Last Admin   sodium chloride  0.9 % injection 10 mL  10 mL Intravenous PRN Choksi, Janak, MD   10 mL at 01/27/15 1416   sodium chloride  flush (NS) 0.9 % injection 10 mL  10 mL Intravenous PRN  Brahmanday, Govinda R, MD   10 mL at 12/10/15 1347

## 2023-08-30 NOTE — Patient Instructions (Signed)
 Today, I ordered blood work. You can get them draw at your preferred [facility] draw station. The nearest one to [facility] is at nearby [facility] ([address] [place], [place], [place] [address]).

## 2023-08-30 NOTE — Progress Notes (Signed)
 Pt attempted to perform PFT test but pt was very sob. Pt tried to perform FVC multiple times but was unsuccessful.

## 2023-08-31 ENCOUNTER — Telehealth: Payer: Self-pay | Admitting: Cardiology

## 2023-08-31 DIAGNOSIS — R0602 Shortness of breath: Secondary | ICD-10-CM | POA: Diagnosis not present

## 2023-08-31 NOTE — Telephone Encounter (Signed)
 Called to confirm/remind patient of their appointment at the [facility] on 09/04/23.   Appointment:   [] Confirmed  [] Left mess   [] No answer/No voice mail  [x] VM Full/unable to leave message  [] Phone not in service  Patient reminded to bring all medications and/or complete list.  Confirmed patient has transportation. Gave directions, instructed to utilize valet parking.

## 2023-09-04 ENCOUNTER — Encounter: Payer: Self-pay | Admitting: Cardiology

## 2023-09-04 ENCOUNTER — Ambulatory Visit: Attending: Cardiology | Admitting: Cardiology

## 2023-09-04 VITALS — BP 132/93 | HR 62 | Wt 293.0 lb

## 2023-09-04 DIAGNOSIS — I5030 Unspecified diastolic (congestive) heart failure: Secondary | ICD-10-CM | POA: Insufficient documentation

## 2023-09-04 DIAGNOSIS — N189 Chronic kidney disease, unspecified: Secondary | ICD-10-CM | POA: Diagnosis not present

## 2023-09-04 DIAGNOSIS — F32A Depression, unspecified: Secondary | ICD-10-CM | POA: Insufficient documentation

## 2023-09-04 DIAGNOSIS — R0609 Other forms of dyspnea: Secondary | ICD-10-CM | POA: Insufficient documentation

## 2023-09-04 DIAGNOSIS — I130 Hypertensive heart and chronic kidney disease with heart failure and stage 1 through stage 4 chronic kidney disease, or unspecified chronic kidney disease: Secondary | ICD-10-CM | POA: Insufficient documentation

## 2023-09-04 DIAGNOSIS — Z9221 Personal history of antineoplastic chemotherapy: Secondary | ICD-10-CM | POA: Insufficient documentation

## 2023-09-04 DIAGNOSIS — Z853 Personal history of malignant neoplasm of breast: Secondary | ICD-10-CM | POA: Diagnosis not present

## 2023-09-04 DIAGNOSIS — E669 Obesity, unspecified: Secondary | ICD-10-CM | POA: Diagnosis not present

## 2023-09-04 DIAGNOSIS — Z923 Personal history of irradiation: Secondary | ICD-10-CM | POA: Insufficient documentation

## 2023-09-04 DIAGNOSIS — Z86711 Personal history of pulmonary embolism: Secondary | ICD-10-CM | POA: Insufficient documentation

## 2023-09-04 DIAGNOSIS — Z7901 Long term (current) use of anticoagulants: Secondary | ICD-10-CM | POA: Diagnosis not present

## 2023-09-04 DIAGNOSIS — I272 Pulmonary hypertension, unspecified: Secondary | ICD-10-CM | POA: Insufficient documentation

## 2023-09-04 DIAGNOSIS — I4891 Unspecified atrial fibrillation: Secondary | ICD-10-CM | POA: Insufficient documentation

## 2023-09-04 MED ORDER — SPIRONOLACTONE 25 MG PO TABS: 25.0000 mg | ORAL_TABLET | Freq: Every day | ORAL | 3 refills | Status: AC

## 2023-09-04 MED ORDER — METOPROLOL TARTRATE 50 MG PO TABS: 100.0000 mg | ORAL_TABLET | Freq: Two times a day (BID) | ORAL | 3 refills | Status: DC

## 2023-09-04 NOTE — Progress Notes (Unsigned)
 ADVANCED HEART FAILURE NEW PATIENT CLINIC NOTE  Referring Physician: Franchester Mikey DEL, PA-C  Primary Care: Valora Agent, MD Primary Cardiologist:  HPI: Maria Gutierrez is a 83 y.o. female with a PMH of HTN, obesity, depression, CKD, prior PE, HFpEF, breast cancer s/p radiation/chemo who presents for initial visit for further evaluation and treatment of heart failure/cardiomyopathy.      Patient was admitted 6/24 for acute pulmonary embolism, requiring mechanical thrombectomy.  Was also found to be in atrial fibrillation with RVR requiring amiodarone  and metoprolol .  She was discharged on medical therapy and anticoagulation.  Subsequent follow-ups were significant for worsening shortness of breath.  She has series of normal stress test and subsequently a right and left heart catheterization that showed mild CAD, moderate group 2 pulmonary hypertension and elevated filling pressures.  She was referred to heart failure clinic for worsening symptoms.  With also seen by pulmonary clinic on 7/3 at which point in time she had autoimmune workup, high-resolution CT was ordered.  She was unable to complete PFTs. Has a loop recorder in place given history of atrial fibrillation.     SUBJECTIVE:  Patient reports that she is having ongoing issues. Chief complaint is shortness of breath, she notes that this has been the same since she had her pulmonary embolism and she feels she is still far away from her baseline. She reports that when taking the farxiga  she felt very dizzy and stopped the medication with some improvement, denies urinary symptoms. No history of autoimmune disease or PH. She was seen by pulmonary who ordered a CT chest as well as autoimmune workup.   PMH, current medications, allergies, social history, and family history reviewed in epic.  PHYSICAL EXAM: Vitals:   09/04/23 1048  BP: (!) 132/93  Pulse: 62  SpO2: 98%   GENERAL: chronically ill appearing PULM:  Normal work of  breathing, clear to auscultation bilaterally. Respirations are unlabored.  CARDIAC:  JVP: mildly elevated         Normal rate with regular rhythm. No murmurs, rubs or gallops.  1+ LE edema. Warm and well perfused extremities. ABDOMEN: Soft, non-tender, non-distended. NEUROLOGIC: Patient is oriented x3 with no focal or lateralizing neurologic deficits.    DATA REVIEW  ECG: 08/04/23: NSR, nonspecific ST abnormality  ECHO: 06/29/2023: LVEF 55 to 60%, grade 1 diastolic dysfunction, normal RV systolic function  CATH: 07/11/2023: Mild nonobstructive coronary disease, RA 15, PA 70/35 (47), PCWP 32, normal cardiac output and index by Fick and thermodilution   ASSESSMENT & PLAN:  HFpEF: Appears to be primary driver of shortness of breath, though significant respiratory variation seen on wedge tracing so may have some precapillary component as well. Appears modestly volume up, weight has been trending up as well. Dizzy symptoms with SGLT-2, will stop and add spironolactone  to augment. 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 Management of HFpEF as above, but will also obtain V/Q scan given history of PE. - Continue eliquis  5mg  BID - diuresis as above - V/Q Scan - CT high res ordered - Autoimmune workup unremarkable thus far  CKD: - Off SGLT-2 consider retrial in the future  PE: Prior history. -  Continue apixaban  5mg  BID  Afib: Currently in NSR. - Continue apixaban    Follow up in 2-3 months  I spent 65 minutes caring for this patient today including face to face time, ordering and reviewing labs, reviewing records from multiple  hospitalizations, personally reviewing echo images and RHC waveforms, discussing diagnosis of PH, seeing the patient, documenting in the record, and arranging follow ups.   [name], MD Advanced Heart Failure Mechanical Circulatory Support 09/04/23

## 2023-09-04 NOTE — Patient Instructions (Addendum)
 Medication Changes:  STOP Farxiga   START Spironolactone  25mg  (1 tab) daily  DECREASE Metoprolol  50mg  (1 tab) two times daily  Lab Work:  Go over to the [facility]. Go pass the gift shop and have your blood work completed IN TWO WEEKS!  We will only call you if the results are abnormal or if the provider would like to make medication changes.   Testing/Procedures:  Your physician has ordered you a VQ Scan. This has been scheduled for July 15th at 1:30pm at the [facility].    Follow-Up in: Please follow up with the [facility] in 2 months with Dr. Zenaida.  At the [facility], you and your health needs are our priority. We have a designated team specialized in the treatment of Heart Failure. This Care Team includes your primary Heart Failure Specialized Cardiologist (physician), Advanced Practice Providers (APPs- Physician Assistants and Nurse Practitioners), and Pharmacist who all work together to provide you with the care you need, when you need it.   You may see any of the following providers on your designated Care Team at your next follow up:  Dr. Toribio Fuel Dr. Ezra Shuck Dr. Ria Commander Dr. Odis Zenaida Ellouise Class, FNP Jaun Bash, RPH-CPP  Please be sure to bring in all your medications bottles to every appointment.   Need to Contact Us :  If you have any questions or concerns before your next appointment please send us  a message through [place] or call our office at [phone].    TO LEAVE A MESSAGE FOR THE NURSE SELECT OPTION 2, PLEASE LEAVE A MESSAGE INCLUDING: YOUR NAME DATE OF BIRTH CALL BACK NUMBER REASON FOR CALL**this is important as we prioritize the call backs  YOU WILL RECEIVE A CALL BACK THE SAME DAY AS LONG AS YOU CALL BEFORE 4:00 PM

## 2023-09-05 ENCOUNTER — Ambulatory Visit: Payer: Self-pay | Admitting: Student in an Organized Health Care Education/Training Program

## 2023-09-05 MED ORDER — METOPROLOL TARTRATE 50 MG PO TABS: 50.0000 mg | ORAL_TABLET | Freq: Two times a day (BID) | ORAL | 3 refills | Status: AC

## 2023-09-08 ENCOUNTER — Ambulatory Visit
Admission: RE | Admit: 2023-09-08 | Discharge: 2023-09-08 | Disposition: A | Discharge status: Home / Self Care | Source: Ambulatory Visit | Attending: Student in an Organized Health Care Education/Training Program | Admitting: Student in an Organized Health Care Education/Training Program

## 2023-09-08 DIAGNOSIS — R0602 Shortness of breath: Secondary | ICD-10-CM | POA: Diagnosis not present

## 2023-09-08 DIAGNOSIS — I700 Atherosclerosis of aorta: Secondary | ICD-10-CM | POA: Diagnosis not present

## 2023-09-08 DIAGNOSIS — J8410 Pulmonary fibrosis, unspecified: Secondary | ICD-10-CM | POA: Diagnosis not present

## 2023-09-12 ENCOUNTER — Other Ambulatory Visit

## 2023-09-12 ENCOUNTER — Other Ambulatory Visit: Payer: Self-pay | Admitting: Medical

## 2023-09-12 ENCOUNTER — Encounter

## 2023-09-12 DIAGNOSIS — I2699 Other pulmonary embolism without acute cor pulmonale: Secondary | ICD-10-CM

## 2023-09-12 DIAGNOSIS — I4891 Unspecified atrial fibrillation: Secondary | ICD-10-CM

## 2023-09-12 NOTE — Telephone Encounter (Signed)
 Eliquis  5mg  refill request received. Patient is 83 years old, weight-132.9kg, Crea-1.45 on 08/22/23, Diagnosis-Afib and PE, and last seen by Dr. Zenaida on 09/04/23. Dose is appropriate based on dosing criteria. Will send in refill to requested pharmacy.

## 2023-09-13 ENCOUNTER — Other Ambulatory Visit

## 2023-09-14 ENCOUNTER — Ambulatory Visit
Admission: RE | Admit: 2023-09-14 | Discharge: 2023-09-14 | Disposition: A | Discharge status: Home / Self Care | Source: Ambulatory Visit | Attending: Cardiology | Admitting: Cardiology

## 2023-09-14 DIAGNOSIS — R0609 Other forms of dyspnea: Secondary | ICD-10-CM

## 2023-09-14 DIAGNOSIS — I5030 Unspecified diastolic (congestive) heart failure: Secondary | ICD-10-CM | POA: Insufficient documentation

## 2023-09-14 DIAGNOSIS — R918 Other nonspecific abnormal finding of lung field: Secondary | ICD-10-CM | POA: Diagnosis not present

## 2023-09-14 DIAGNOSIS — R0602 Shortness of breath: Secondary | ICD-10-CM | POA: Diagnosis not present

## 2023-09-14 MED ORDER — TECHNETIUM TO 99M ALBUMIN AGGREGATED
4.0000 | Freq: Once | INTRAVENOUS | Status: AC | PRN
  Administered 2023-09-14: 4.13 via INTRAVENOUS

## 2023-09-15 LAB — ANA 12 PLUS PROFILE, POSITIVE
Anti-Cardiolipin Ab, IgA (RDL): <12 U/mL (ref <12)
Anti-Cardiolipin Ab, IgG (RDL): <15 GPL U/mL (ref <15)
Anti-Cardiolipin Ab, IgM (RDL): <13 [MPL'U]/mL (ref <13)
Anti-Centromere Ab (RDL): <1:40 {titer}
Anti-Chromatin Ab, IgG (RDL): <20 U (ref <20)
Anti-La (SS-B) Ab (RDL): <20 U (ref <20)
Anti-Ro (SS-A) Ab (RDL): <20 U (ref <20)
Anti-Scl-70 Ab (RDL): <20 U (ref <20)
Anti-Sm Ab (RDL): <20 U (ref <20)
Anti-TPO Ab (RDL): <9 [IU]/mL (ref <9.0)
Anti-U1 RNP Ab (RDL): <20 U (ref <20)
Anti-dsDNA Ab by Farr(RDL): <8 [IU]/mL (ref <8.0)
C3 Complement (RDL): 271 mg/dL — ABNORMAL HIGH (ref 90–180)
C4 Complement (RDL): 40 mg/dL (ref 10–40)
Speckled Pattern: 1:160 {titer} — ABNORMAL HIGH

## 2023-09-15 LAB — ANTI-CCP AB, IGG + IGA (RDL): Anti-CCP Ab, IgG + IgA (RDL): <20 U (ref <20)

## 2023-09-15 LAB — ANCA PROFILE
Anti-MPO Antibodies: <0.2 U (ref 0.0–0.9)
Anti-PR3 Antibodies: <0.2 U (ref 0.0–0.9)
Atypical pANCA: <1:20 {titer}
C-ANCA: <1:20 {titer}
P-ANCA: <1:20 {titer}

## 2023-09-15 LAB — RHEUMATOID FACTOR: Rheumatoid fact SerPl-aCnc: 13.6 [IU]/mL (ref <14.0)

## 2023-09-15 LAB — ANA 12 PLUS PROFILE (RDL): Anti-Nuclear Ab by IFA (RDL): POSITIVE — AB

## 2023-09-18 ENCOUNTER — Other Ambulatory Visit

## 2023-09-19 ENCOUNTER — Ambulatory Visit: Payer: Self-pay | Admitting: Cardiology

## 2023-09-19 ENCOUNTER — Telehealth: Payer: Self-pay | Admitting: Internal Medicine

## 2023-09-19 ENCOUNTER — Telehealth: Payer: Self-pay | Admitting: Cardiology

## 2023-09-19 NOTE — Telephone Encounter (Signed)
 Randine w/ [facility] calling w/ report on Nuc Pulm Perf

## 2023-09-19 NOTE — Telephone Encounter (Signed)
 Called to confirm/remind patient of their appointment at the [facility] on 09/20/23.   Appointment:   [x] Confirmed  [] Left mess   [] No answer/No voice mail  [] VM Full/unable to leave message  [] Phone not in service  Patient reminded to bring all medications and/or complete list.  Confirmed patient has transportation. Gave directions, instructed to utilize valet parking.

## 2023-09-20 ENCOUNTER — Ambulatory Visit: Attending: Cardiology | Admitting: Cardiology

## 2023-09-20 ENCOUNTER — Encounter: Payer: Self-pay | Admitting: Cardiology

## 2023-09-20 VITALS — BP 122/91 | HR 80 | Wt 287.8 lb

## 2023-09-20 DIAGNOSIS — Z79899 Other long term (current) drug therapy: Secondary | ICD-10-CM | POA: Diagnosis not present

## 2023-09-20 DIAGNOSIS — N183 Chronic kidney disease, stage 3 unspecified: Secondary | ICD-10-CM

## 2023-09-20 DIAGNOSIS — I2510 Atherosclerotic heart disease of native coronary artery without angina pectoris: Secondary | ICD-10-CM | POA: Insufficient documentation

## 2023-09-20 DIAGNOSIS — I4891 Unspecified atrial fibrillation: Secondary | ICD-10-CM | POA: Insufficient documentation

## 2023-09-20 DIAGNOSIS — I5030 Unspecified diastolic (congestive) heart failure: Secondary | ICD-10-CM | POA: Diagnosis not present

## 2023-09-20 DIAGNOSIS — Z86711 Personal history of pulmonary embolism: Secondary | ICD-10-CM | POA: Diagnosis not present

## 2023-09-20 DIAGNOSIS — I272 Pulmonary hypertension, unspecified: Secondary | ICD-10-CM | POA: Insufficient documentation

## 2023-09-20 DIAGNOSIS — I480 Paroxysmal atrial fibrillation: Secondary | ICD-10-CM

## 2023-09-20 DIAGNOSIS — Z008 Encounter for other general examination: Secondary | ICD-10-CM | POA: Insufficient documentation

## 2023-09-20 MED ORDER — ALBUTEROL SULFATE HFA 108 (90 BASE) MCG/ACT IN AERS
2.0000 | INHALATION_SPRAY | Freq: Four times a day (QID) | RESPIRATORY_TRACT | 0 refills | Status: DC | PRN
Start: 2023-09-20 — End: 2023-11-29

## 2023-09-20 NOTE — Patient Instructions (Addendum)
 Medication Changes:  No medication changes today!  REFILLED Albuterol   Testing/Procedures:  [facility] Cardiac Cath Instructions  You are scheduled for a Cardiac Cath on:_________________________ Please arrive at _______am on the day of your procedure Please expect a call from our [facility] to pre-register you Do not eat/drink anything after midnight Someone will need to drive you home It is recommended someone be with you for the first 24 hours after your procedure Wear clothes that are easy to get on/off and wear slip on shoes if possible   Medications bring a current list of all medications with you   _X_ Do not take these medications before your procedure:________Furosemide and Spironolactone  ______   Rosine may take the rest of your medications the morning of with a sip of water.  Day of your procedure: Arrive at the Heart and Vascular entrance.  After receiving your armband someone will escort you to the cardiac cath/special procedures waiting area.  The usual length of stay after your procedure is about 2 to 3 hours.  This can vary.  If you have any questions, please call our office at [phone], or you may call the cardiac cath lab at [facility] directly at [phone]   Follow-Up in: Please follow up with the [facility] in 2 months with Dr. Zenaida.   At the [facility], you and your health needs are our priority. We have a designated team specialized in the treatment of Heart Failure. This Care Team includes your primary Heart Failure Specialized Cardiologist (physician), Advanced Practice Providers (APPs- Physician Assistants and Nurse Practitioners), and Pharmacist who all work together to provide you with the care you need, when you need it.   You may see any of the following providers on your designated Care Team at your next follow up:  Dr. Toribio Fuel Dr. Ezra Shuck Dr. Ria Commander Dr. Odis Zenaida Ellouise Class, FNP Jaun Bash, RPH-CPP  Please be sure to bring in all your medications bottles to every appointment.   Need to Contact Us :  If you have any questions or concerns before your next appointment please send us  a message through [place] or call our office at [phone].    TO LEAVE A MESSAGE FOR THE NURSE SELECT OPTION 2, PLEASE LEAVE A MESSAGE INCLUDING: YOUR NAME DATE OF BIRTH CALL BACK NUMBER REASON FOR CALL**this is important as we prioritize the call backs  YOU WILL RECEIVE A CALL BACK THE SAME DAY AS LONG AS YOU CALL BEFORE 4:00 PM

## 2023-09-24 NOTE — Progress Notes (Unsigned)
 ADVANCED HEART FAILURE FOLLOW UP CLINIC NOTE  Referring Physician: Valora Agent, MD  Primary Care: Valora Agent, MD Primary Cardiologist:  HPI: Maria Gutierrez is a 83 y.o. female who presents for follow up of pulmonary hypertension.          Patient was admitted 6/24 for acute pulmonary embolism, requiring mechanical thrombectomy.  Was also found to be in atrial fibrillation with RVR requiring amiodarone  and metoprolol .  She was discharged on medical therapy and anticoagulation.  Subsequent follow-ups were significant for worsening shortness of breath.  She has series of normal stress test and subsequently a right and left heart catheterization that showed mild CAD, moderate group 2 pulmonary hypertension and elevated filling pressures.  She was referred to heart failure clinic for worsening symptoms.  With also seen by pulmonary clinic on 7/3 at which point in time she had autoimmune workup, high-resolution CT was ordered.  She was unable to complete PFTs. Has a loop recorder in place given history of atrial fibrillation.      SUBJECTIVE:  Patient reports that even though she has lost weight since her last clinic visit she continues to be extremely short of breath with any sort of exertion.  She denies any recent lightheadedness or dizziness, denies any active chest pain.  Reviewed her recent V/q. scan which probably shows evidence of chronic clots in the right lobe.  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 She appears near euvolemic currently.   PMH, current medications, allergies, social history, and family history reviewed in epic.  PHYSICAL EXAM: Vitals:   09/20/23 1436  BP: (!) 122/91  Pulse: 80  SpO2: 100%   GENERAL: chronically ill appearing PULM:  Normal work of breathing, clear to auscultation bilaterally. Respirations are unlabored.  CARDIAC:  JVP: mildly elevated         Normal rate with regular  rhythm. No murmurs, rubs or gallops.  1+ LE edema. Warm and well perfused extremities. ABDOMEN: Soft, non-tender, non-distended. NEUROLOGIC: Patient is oriented x3 with no focal or lateralizing neurologic deficits.      DATA REVIEW  ECG: 08/04/23: NSR, nonspecific ST abnormality   ECHO: 06/29/2023: LVEF 55 to 60%, grade 1 diastolic dysfunction, normal RV systolic function   CATH: 07/11/2023: Mild nonobstructive coronary disease, RA 15, PA 70/35 (47), PCWP 32, normal cardiac output and index by Fick and thermodilution   ASSESSMENT & PLAN:  HFpEF: Volume symptoms improving, but remains short of breath. Further evaluation of PH as below, suspect more of a precapillary component given difficulty with diuresis.  - Continue spironolactone  25mg  daily - Continue lasix  40mg  daily - Stop SGLT-2, can retrial in the future (sxs with farxiga ) - Continue metoprolol  50mg  BID - Labs prior to procedure   Pulmonary hypertension: V/Q scan markedly abnormal suggesting some element of chronic disease especially as she has been on anticoagulation. 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 If she has significant disease on angiography may benefit from intervention. Will also obtain wedge sat as exam is not consistent with PCWP of 32.  - Discussed at length with the patient, will proceed with RHC and pulmonary angiography - Continue eliquis  5mg  BID - diuresis as above - V/Q Scan and CT scan reveiwed   CKD: - Off SGLT-2 consider retrial in the future   PE: Prior history. - Continue apixaban  5mg  BID, continue for procedure  Afib: Currently in NSR. - Continue apixaban    I spent 45 minutes caring for this patient today including face to face time, ordering and reviewing labs, reviewing records including multiple recent imaging scans, discussed disease and plan for cardiac catheterization, seeing the patient, documenting in the  record, and arranging follow ups.   [name], MD Advanced Heart Failure Mechanical Circulatory Support 09/24/23

## 2023-10-04 ENCOUNTER — Other Ambulatory Visit: Payer: Self-pay | Admitting: Medical

## 2023-10-09 ENCOUNTER — Other Ambulatory Visit: Payer: Self-pay | Admitting: Cardiology

## 2023-10-12 ENCOUNTER — Other Ambulatory Visit: Payer: Self-pay

## 2023-10-12 DIAGNOSIS — I272 Pulmonary hypertension, unspecified: Secondary | ICD-10-CM

## 2023-10-12 NOTE — Progress Notes (Signed)
 Orders placed for right heart cath with pa angiogram.  No precert required

## 2023-10-31 ENCOUNTER — Other Ambulatory Visit: Payer: Self-pay

## 2023-10-31 ENCOUNTER — Ambulatory Visit
Admission: RE | Admit: 2023-10-31 | Discharge: 2023-10-31 | Disposition: A | Discharge status: Home / Self Care | Attending: Cardiology | Admitting: Cardiology

## 2023-10-31 ENCOUNTER — Encounter: Admitting: Cardiology

## 2023-10-31 ENCOUNTER — Encounter
Admission: RE | Disposition: A | Discharge status: Home / Self Care | Payer: Self-pay | Source: Home / Self Care | Attending: Cardiology

## 2023-10-31 DIAGNOSIS — I5030 Unspecified diastolic (congestive) heart failure: Secondary | ICD-10-CM | POA: Insufficient documentation

## 2023-10-31 DIAGNOSIS — Z79899 Other long term (current) drug therapy: Secondary | ICD-10-CM | POA: Diagnosis not present

## 2023-10-31 DIAGNOSIS — I272 Pulmonary hypertension, unspecified: Secondary | ICD-10-CM | POA: Diagnosis present

## 2023-10-31 DIAGNOSIS — Z95818 Presence of other cardiac implants and grafts: Secondary | ICD-10-CM | POA: Diagnosis not present

## 2023-10-31 DIAGNOSIS — Z86711 Personal history of pulmonary embolism: Secondary | ICD-10-CM | POA: Diagnosis not present

## 2023-10-31 DIAGNOSIS — Z7901 Long term (current) use of anticoagulants: Secondary | ICD-10-CM | POA: Insufficient documentation

## 2023-10-31 DIAGNOSIS — I2722 Pulmonary hypertension due to left heart disease: Secondary | ICD-10-CM | POA: Insufficient documentation

## 2023-10-31 DIAGNOSIS — N189 Chronic kidney disease, unspecified: Secondary | ICD-10-CM | POA: Diagnosis not present

## 2023-10-31 DIAGNOSIS — J984 Other disorders of lung: Secondary | ICD-10-CM | POA: Diagnosis not present

## 2023-10-31 HISTORY — PX: PULMONARY ANGIOGRAPHY: CATH118325

## 2023-10-31 HISTORY — PX: RIGHT HEART CATH: CATH118263

## 2023-10-31 LAB — POCT I-STAT EG7
Acid-Base Excess: 1 mmol/L (ref 0.0–2.0)
Acid-Base Excess: 1 mmol/L (ref 0.0–2.0)
Bicarbonate: 27.3 mmol/L (ref 20.0–28.0)
Bicarbonate: 27.2 mmol/L (ref 20.0–28.0)
Calcium, Ion: 1.24 mmol/L (ref 1.15–1.40)
Calcium, Ion: 1.23 mmol/L (ref 1.15–1.40)
HCT: 40 % (ref 36.0–46.0)
HCT: 40 % (ref 36.0–46.0)
Hemoglobin: 13.6 g/dL (ref 12.0–15.0)
Hemoglobin: 13.6 g/dL (ref 12.0–15.0)
O2 Saturation: 74 %
O2 Saturation: 73 %
Potassium: 3.7 mmol/L (ref 3.5–5.1)
Potassium: 3.7 mmol/L (ref 3.5–5.1)
Sodium: 141 mmol/L (ref 135–145)
Sodium: 142 mmol/L (ref 135–145)
TCO2: 29 mmol/L (ref 22–32)
TCO2: 29 mmol/L (ref 22–32)
pCO2, Ven: 47.8 mmHg (ref 44–60)
pCO2, Ven: 47.3 mmHg (ref 44–60)
pH, Ven: 7.365 (ref 7.25–7.43)
pH, Ven: 7.369 (ref 7.25–7.43)
pO2, Ven: 41 mmHg (ref 32–45)
pO2, Ven: 40 mmHg (ref 32–45)

## 2023-10-31 LAB — CBC
HCT: 43 % (ref 36.0–46.0)
Hemoglobin: 14.2 g/dL (ref 12.0–15.0)
MCH: 31.6 pg (ref 26.0–34.0)
MCHC: 33 g/dL (ref 30.0–36.0)
MCV: 95.8 fL (ref 80.0–100.0)
Platelets: 200 K/uL (ref 150–400)
RBC: 4.49 MIL/uL (ref 3.87–5.11)
RDW: 12.8 % (ref 11.5–15.5)
WBC: 9.3 K/uL (ref 4.0–10.5)
nRBC: 0 % (ref 0.0–0.2)

## 2023-10-31 LAB — BASIC METABOLIC PANEL WITH GFR
Anion gap: 14 (ref 5–15)
BUN: 23 mg/dL (ref 8–23)
CO2: 25 mmol/L (ref 22–32)
Calcium: 9.1 mg/dL (ref 8.9–10.3)
Chloride: 103 mmol/L (ref 98–111)
Creatinine, Ser: 1.32 mg/dL — ABNORMAL HIGH (ref 0.44–1.00)
GFR, Estimated: 40 mL/min — ABNORMAL LOW (ref >60)
Glucose, Bld: 104 mg/dL — ABNORMAL HIGH (ref 70–99)
Potassium: 4.2 mmol/L (ref 3.5–5.1)
Sodium: 142 mmol/L (ref 135–145)

## 2023-10-31 SURGERY — RIGHT HEART CATH
Anesthesia: Moderate Sedation | Laterality: Right

## 2023-10-31 MED ORDER — FREE WATER: 250.0000 mL | Freq: Once | Status: DC

## 2023-10-31 MED ORDER — HEPARIN (PORCINE) IN NACL 1000-0.9 UT/500ML-% IV SOLN
INTRAVENOUS | Status: AC
  Filled 2023-10-31: qty 1000

## 2023-10-31 MED ORDER — HEPARIN (PORCINE) IN NACL 1000-0.9 UT/500ML-% IV SOLN
INTRAVENOUS | Status: DC | PRN
Start: 2023-10-31 — End: 2023-10-31
  Administered 2023-10-31: 1000 mL

## 2023-10-31 MED ORDER — IOHEXOL 300 MG/ML  SOLN
INTRAMUSCULAR | Status: DC | PRN
Start: 2023-10-31 — End: 2023-10-31
  Administered 2023-10-31: 120 mL

## 2023-10-31 MED ORDER — FENTANYL CITRATE (PF) 100 MCG/2ML IJ SOLN
INTRAMUSCULAR | Status: AC
  Filled 2023-10-31: qty 2

## 2023-10-31 MED ORDER — LIDOCAINE HCL 1 % IJ SOLN
INTRAMUSCULAR | Status: AC
  Filled 2023-10-31: qty 20

## 2023-10-31 MED ORDER — MIDAZOLAM HCL 2 MG/2ML IJ SOLN
INTRAMUSCULAR | Status: AC
Start: 2023-10-31 — End: 2023-10-31
  Filled 2023-10-31: qty 2

## 2023-10-31 MED ORDER — SODIUM CHLORIDE 0.9% FLUSH: 3.0000 mL | Freq: Two times a day (BID) | INTRAVENOUS | Status: DC

## 2023-10-31 MED ORDER — SODIUM CHLORIDE 0.9 % IV BOLUS: 500.0000 mL | Freq: Once | INTRAVENOUS | Status: DC

## 2023-10-31 MED ORDER — ASPIRIN 81 MG PO CHEW: 81.0000 mg | CHEWABLE_TABLET | ORAL | Status: DC

## 2023-10-31 MED ORDER — SODIUM CHLORIDE 0.9 % IV SOLN: 250.0000 mL | INTRAVENOUS | Status: DC | PRN

## 2023-10-31 MED ORDER — SODIUM CHLORIDE 0.9% FLUSH: 3.0000 mL | INTRAVENOUS | Status: DC | PRN

## 2023-10-31 MED ORDER — LIDOCAINE HCL (PF) 1 % IJ SOLN
INTRAMUSCULAR | Status: DC | PRN
  Administered 2023-10-31: 2 mL

## 2023-10-31 SURGICAL SUPPLY — 9 items
CATH INFINITI 5FR ANG PIGTAIL (CATHETERS) IMPLANT
CATH SWAN GANZ 7F STRAIGHT (CATHETERS) IMPLANT
DRAPE BRACHIAL (DRAPES) IMPLANT
GLIDESHEATH SLENDER 7FR .021G (SHEATH) IMPLANT
GUIDEWIRE INQWIRE 1.5J.035X260 (WIRE) IMPLANT
KIT MICROPUNCTURE VSI 5F STIFF (SHEATH) IMPLANT
PACK ANGIOGRAPHY (CUSTOM PROCEDURE TRAY) ×2 IMPLANT
SET ATX-X65L (MISCELLANEOUS) IMPLANT
STATION PROTECTION PRESSURIZED (MISCELLANEOUS) IMPLANT

## 2023-10-31 NOTE — H&P (Signed)
    ADVANCED HEART FAILURE H AND P   Referring Physician: Valora Agent, MD  Primary Care: Valora Agent, MD Primary Cardiologist:   HPI: LAQUASIA PINCUS is a 83 y.o. female who presents for RHC and PA angiogram.    Patient was admitted 6/24 for acute pulmonary embolism, requiring mechanical thrombectomy.  Was also found to be in atrial fibrillation with RVR requiring amiodarone  and metoprolol .  She was discharged on medical therapy and anticoagulation.   Subsequent follow-ups were significant for worsening shortness of breath.  She has series of normal stress test and subsequently a right and left heart catheterization that showed mild CAD, moderate group 2 pulmonary hypertension and elevated filling pressures.  She was referred to heart failure clinic for worsening symptoms.  With also seen by pulmonary clinic on 7/3 at which point in time she had autoimmune workup, high-resolution CT was ordered.  She was unable to complete PFTs. Has a loop recorder in place given history of atrial fibrillation.        SUBJECTIVE:   Planned procedure today.    PMH, current medications, allergies, social history, and family history reviewed in epic.   PHYSICAL EXAM:  GENERAL: NAD, chronically ill appearing PULM:  Normal work of breathing, CTAB CARDIAC:  JVP: mildly elevated         Normal rate with regular rhythm. No murmurs, rubs or gallops.  Trace edema. Warm and well perfused extremities. ABDOMEN: Soft, non-tender, non-distended. NEUROLOGIC: Patient is oriented x3 with no focal or lateralizing neurologic deficits.         DATA REVIEW   ECG: 08/04/23: NSR, nonspecific ST abnormality   ECHO: 06/29/2023: LVEF 55 to 60%, grade 1 diastolic dysfunction, normal RV systolic function   CATH: 07/11/2023: Mild nonobstructive coronary disease, RA 15, PA 70/35 (47), PCWP 32, normal cardiac output and index by Fick and thermodilution     ASSESSMENT & PLAN:   HFpEF: Volume symptoms improving, but  remains short of breath. Further evaluation of PH as below, suspect more of a precapillary component given difficulty with diuresis.  - Continue spironolactone  25mg  daily - Continue lasix  40mg  daily - Stop SGLT-2, can retrial in the future (sxs with farxiga ) - Continue metoprolol  50mg  BID - Labs prior to procedure   Pulmonary hypertension:  - Discussed at length with the patient, will proceed with RHC and pulmonary angiography - Continue eliquis  5mg  BID - diuresis as above - V/Q Scan and CT scan reveiwed   CKD: - Off SGLT-2 consider retrial in the future   PE: Prior history. - Continue apixaban  5mg  BID, continue for procedure   Afib: Currently in NSR. - Continue apixaban         [name], MD Advanced Heart Failure Mechanical Circulatory Support

## 2023-11-02 ENCOUNTER — Encounter: Payer: Self-pay | Admitting: Cardiology

## 2023-11-08 ENCOUNTER — Ambulatory Visit: Attending: Medical | Admitting: Medical

## 2023-11-08 NOTE — Progress Notes (Deleted)
 Cardiology Office Note   Date:  11/08/2023  ID:  Maria Gutierrez, DOB Aug 12, 1940, MRN [id number] PCP: Valora Agent, MD  [facility] Providers Cardiologist:  Lonni Hanson, MD { Click to update primary MD,subspecialty MD or APP then REFRESH:1}    History of Present Illness Maria Gutierrez is a 83 y.o. female with a hx of  HTN, obesity, depression and anxiety, CKD stage 3, PE in 07/2022, breast cancer s/p radiation and chemotherapy, and gout who present for follow-up for heart catheterization   The patient was admitted 07/2022 for acute PE, severe sepsis 2/2 PNA, acute respiratiry failure, AKI on CKD stage 3, and new onset Afib. She presented with SOB fount to have bilateral PE started on IV heparin . She was in sinus tach/MAT, and later on developed Afib. Patient underwent mechanical thrombectomy by VVS on 6/4. Afib rates were uncontrolled with just metoprolol  and she was started on amiodarone . HS troponin was elevated 24>25.  Echo showed LVEF 60-65%, mild to mod hypertrophjy, normal RVSF, mildly elevated pulmonary artery pressure, trivial MR, ascending aorta measuring 36mm. She was transitioned to oral amiodarone  and started on Eliquis  5mg  BID. Follow-up Myoview  Lexiscan  was low risk normal from logical myocardial perfusion test with a small in size, mild in severity, fixed mid inferolateral and apical lateral defect that may represent subtle scarring cannot rule out artifact.  No ischemia noted.  EF 65%.  30 calcification and aortic atherosclerosis were noted on attenuation correction CT.   The patient was seen 01/2023 and reported fatigue. Her inhaler was refilled and she was referred to pulmonology. She saw pulmonology and PFTs and echo were ordered. TSH came back at 69 and amiodarone  was stopped. Thyroid  function is improving.    The patient was seen 05/12/23 and reporting DOE. PCP did a heart monitor that showed no serious arrhythmias. Cardiac PET stress test  showed small area infarction  with per-infarct ischemia, but cannot rule out artifact, intermediate risk study.    She was seen back 07/06/23 reporting persistent symptoms and was set up for R/L heart cath. This showed minimal luminal irregularities in the LAD, no cignificant CAD, moderately to severely elevated left heart, right heart, and pulmonary artery pressures. Lasix  was increased to 40mg  daily.  The patient was referred to advanced heart failure team.  Patient was seen by them and VQ scan was ordered.  This showed abnormality suggesting element of chronic disease.  She did not tolerate Farxiga .  Right heart cath 10/31/2023 showed mild pulmonary hypertension.  Patient was started on riociguat.   ROS: ***  Studies Reviewed      *** Risk Assessment/Calculations {Does this patient have ATRIAL FIBRILLATION?[phone]} No BP recorded.  {Refresh Note OR Click here to enter BP  :1}***       Physical Exam VS:  There were no vitals taken for this visit.       Wt Readings from Last 3 Encounters:  10/31/23 290 lb (131.5 kg)  09/20/23 287 lb 12.8 oz (130.5 kg)  09/04/23 293 lb (132.9 kg)    GEN: Well nourished, well developed in no acute distress NECK: No JVD; No carotid bruits CARDIAC: ***RRR, no murmurs, rubs, gallops RESPIRATORY:  Clear to auscultation without rales, wheezing or rhonchi  ABDOMEN: Soft, non-tender, non-distended EXTREMITIES:  No edema; No deformity   ASSESSMENT AND PLAN ***    {Are you ordering a CV Procedure (e.g. stress test, cath, DCCV, TEE, etc)?   Press F2        :[id number]}  Dispo: ***  Signed, Elisah Parmer VEAR Fishman, PA-C

## 2023-11-14 ENCOUNTER — Ambulatory Visit: Attending: Medical | Admitting: Medical

## 2023-11-14 ENCOUNTER — Encounter: Payer: Self-pay | Admitting: Medical

## 2023-11-14 VITALS — BP 120/69 | HR 81 | Ht 63.0 in | Wt 292.4 lb

## 2023-11-14 DIAGNOSIS — I5032 Chronic diastolic (congestive) heart failure: Secondary | ICD-10-CM

## 2023-11-14 DIAGNOSIS — N183 Chronic kidney disease, stage 3 unspecified: Secondary | ICD-10-CM

## 2023-11-14 DIAGNOSIS — I272 Pulmonary hypertension, unspecified: Secondary | ICD-10-CM

## 2023-11-14 DIAGNOSIS — I480 Paroxysmal atrial fibrillation: Secondary | ICD-10-CM | POA: Diagnosis not present

## 2023-11-14 DIAGNOSIS — Z86711 Personal history of pulmonary embolism: Secondary | ICD-10-CM | POA: Diagnosis not present

## 2023-11-14 DIAGNOSIS — R0602 Shortness of breath: Secondary | ICD-10-CM | POA: Diagnosis not present

## 2023-11-14 NOTE — Progress Notes (Signed)
 Cardiology Office Note   Date:  11/14/2023  ID:  DEDRA MATSUO, DOB 1940/08/17, MRN [id number] PCP: Valora Agent, MD  [facility] Providers Cardiologist:  Lonni Hanson, MD  History of Present Illness Maria Gutierrez is a 83 y.o. female with a hx of  HTN, obesity, depression and anxiety, CKD stage 3, PE in 07/2022, breast cancer s/p radiation and chemotherapy, HFpEF, mild pulmonary hypertension, paroxysmal A-fib, and gout who present for follow-up of shortness of breath   The patient was admitted 07/2022 for acute PE, severe sepsis 2/2 PNA, acute respiratiry failure, AKI on CKD stage 3, and new onset Afib. She presented with SOB fount to have bilateral PE started on IV heparin . She was in sinus tach/MAT, and later on developed Afib. Patient underwent mechanical thrombectomy by [facility] on 6/4. Afib rates were uncontrolled with just metoprolol  and she was started on amiodarone . HS troponin was elevated 24>25.  Echo showed LVEF 60-65%, mild to mod hypertrophjy, normal RVSF, mildly elevated pulmonary artery pressure, trivial MR, ascending aorta measuring 36mm. She was transitioned to oral amiodarone  and started on Eliquis  5mg  BID. Follow-up Myoview  Lexiscan  was low risk normal from logical myocardial perfusion test with a small in size, mild in severity, fixed mid inferolateral and apical lateral defect that may represent subtle scarring cannot rule out artifact.  No ischemia noted.  EF 65%.  30 calcification and aortic atherosclerosis were noted on attenuation correction CT.   The patient was seen 01/2023 and reported fatigue. Her inhaler was refilled and she was referred to pulmonology. She saw pulmonology and PFTs and echo were ordered. TSH came back at 69 and amiodarone  was stopped. Thyroid  function is improving.    The patient was seen 05/12/23 and reporting DOE. PCP did a heart monitor that showed no serious arrhythmias. Cardiac PET stress test  showed small area infarction with per-infarct  ischemia, but cannot rule out artifact, intermediate risk study.    She was seen back 07/06/23 reporting persistent symptoms and was set up for R/L heart cath. This showed minimal luminal irregularities in the LAD, no cignificant CAD, moderately to severely elevated left heart, right heart, and pulmonary artery pressures. Lasix  was increased to 40mg  daily.  The patient was referred to advanced heart failure team.  Patient was seen by them and VQ scan was ordered.  This showed abnormality suggesting element of chronic disease.  She did not tolerate Farxiga .  Patient was referred to advanced heart failure team and spironolactone  was added.  Right heart cath Dr. Zenaida 10/31/2023 showed mild pulmonary hypertension.  Patient was started on riociguat. Patient saw pulmonology who ordered autoimmune work-up and home sleep study. She was unable to complete PFTs. Chest CT showed pulmonary fibrosis.   Today, the patient reports persistent SOB, also with wheezing in the upper airways. She denies chest pain. She has mild lower leg edema. She has yet to receive the new medication. She feels she needs portable oxygen.   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 Evidence of mild segmental  disease, mainly right upper lobe and left lower lobe   RECOMMENDATIONS: Given significant improvement in pressures will treat medically, plan to start riociguat   R/L heart cath 06/2023 Conclusions: Minimal luminal irregularities noted in the LAD.  No angiographically significant coronary artery disease identified. Moderately to severely elevated left heart, right heart, and pulmonary artery pressures. Normal to mildly reduced cardiac output/index (Fick CO/CI 4.9 L/min, 2.2 L/min/m^2; thermodilution CO/CI 7.1 L/min, 3.1 L/min/m^2).   Recommendations: Escalate diuresis. Consider referral to advanced heart failure team for further workup and management of HFpEF and pulmonary hypertension. Primary prevention of coronary artery disease. If no evidence of bleeding or vascular injury at catheterization sites, apixaban  can be resumed tomorrow morning.   Lonni Hanson, MD [facility]   Right Heart   Right Heart Pressures RA (mean): 15 mmHg RV (S/EDP): 70/20 mmHg PA (S/D, mean): 70/35 (47) mmHg PCWP (mean): 32 mmHg  Ao sat: 92% PA sat: 55%  Fick CO: 4.9 L/min Fick CI: 2.2 L/min/m^2  Thermodilution CO: 7.1 L/min Thermodilution CI: 3.1 L/min/m^2  PVR:  -Fick: 3.1 Wood units -Thermodilution: 2.1 Wood units    Echo 05/2023 1. Left ventricular ejection fraction, by estimation, is 55 to 60%. The  left ventricle has normal function. The left ventricle has no regional  wall motion abnormalities. Left ventricular diastolic parameters are  consistent with Grade I diastolic  dysfunction (impaired relaxation).   2. Right ventricular systolic function is normal. The right ventricular  size is normal.   3. The mitral valve is degenerative. Trivial mitral valve regurgitation.   4. The aortic valve is tricuspid. Aortic valve regurgitation is not  visualized.       Physical Exam VS:  BP 120/69   Pulse 81   Ht 5' 3 (1.6 m)   Wt 292 lb 6.4 oz (132.6 kg)   SpO2 94%   BMI 51.80  kg/m        Wt Readings from Last 3 Encounters:  11/14/23 292 lb 6.4 oz (132.6 kg)  10/31/23 290 lb (131.5 kg)  09/20/23 287 lb 12.8 oz (130.5 kg)    GEN: Well nourished, well developed in no acute distress NECK: No JVD; No carotid bruits CARDIAC: RRR, no murmurs, rubs, gallops RESPIRATORY:  Clear to auscultation without rales, wheezing or rhonchi  ABDOMEN: Soft, non-tender, non-distended EXTREMITIES:  No edema; No deformity   ASSESSMENT AND PLAN  Chronic shortness of breath HFpEF Patient reports persistent SOB, this is multifactorial given pulmonary HTN, HFpEF, pulmonary fibrosis, h/p PE, deconditioning, Afib, ?OSA. Patient does not appear volume up on exam. Continue lasix  40mg  daily and spironolactone  25mg  daiy. She will continue to follow with AHF team.  Pulmonary HTN Right heart cath showed mild pulmonary HTN with elevated PVR 3.2 wood units. She was started on Riociguat by Dr. Zenaida. She has been following with pulmonology who ordered Chest CT, PFTs and home sleep study.  I recommended she continue to follow with them. Patient and husband feel she needs portable O2, which I think is very reasonable to consider.   CKD stage 3 Scr stable at 1.32.   H/o PE Continue Eliquis  5mg  BID.  Afib Patient is in normal sinus rhythm.  Continue Eliquis  5mg BID.      Dispo: Follow-up in 4 months  Signed, Zannie Runkle H  [place], PA-C

## 2023-11-14 NOTE — Patient Instructions (Addendum)
 Medication Instructions:  Your physician recommends that you continue on your current medications as directed. Please refer to the Current Medication list given to you today.    *If you need a refill on your cardiac medications before your next appointment, please call your pharmacy*  Lab Work: No labs ordered today    Testing/Procedures: No test ordered today   Follow-Up: At [facility], you and your health needs are our priority.  As part of our continuing mission to provide you with exceptional heart care, our providers are all part of one team.  This team includes your primary Cardiologist (physician) and Advanced Practice Providers or APPs (Physician Assistants and Nurse Practitioners) who all work together to provide you with the care you need, when you need it.  Your next appointment:   4 month(s)  Provider:   You may see Lonni Hanson, MD or one of the following Advanced Practice Providers on your designated Care Team:   Cadence Franchester, [place]  Pulmonologist: Dr. Lenda

## 2023-11-15 ENCOUNTER — Ambulatory Visit: Payer: Self-pay | Admitting: Student in an Organized Health Care Education/Training Program

## 2023-11-15 ENCOUNTER — Encounter: Payer: Self-pay | Admitting: Internal Medicine

## 2023-11-15 ENCOUNTER — Telehealth: Payer: Self-pay | Admitting: Pharmacy Technician

## 2023-11-15 ENCOUNTER — Other Ambulatory Visit (HOSPITAL_COMMUNITY): Payer: Self-pay

## 2023-11-15 NOTE — Telephone Encounter (Signed)
 LMTCB. E2C2 please advise when patient calls back.

## 2023-11-15 NOTE — Telephone Encounter (Signed)
-----   Message from Henderson Dgayli sent at 11/15/2023 10:57 AM EDT ----- The chest CT is showing some findings that are consistent with fibrosis. This is mild and mildly progressed compared to prior, and probably contributing to her symptoms of shortness of breath (in  addition to the other causes she has). Will discuss further at follow up.  Please call patient with result.  Thanks [employer] ----- Message ----- From: Interface, Rad Results In Sent: 09/12/2023   8:16 AM EDT To: Belva November, MD

## 2023-11-15 NOTE — Telephone Encounter (Signed)
 From staff  Too soon until 11/20/23 lf 09/13/23. We will run on 11/20/23 and see what copay is and see about assistance once confirmed copay and will talk to patient

## 2023-11-20 ENCOUNTER — Other Ambulatory Visit (HOSPITAL_COMMUNITY): Payer: Self-pay

## 2023-11-20 NOTE — Telephone Encounter (Signed)
 Per test claim cost would be free on ins  I called [facility] [phone] and asked them to fill the eliquis . Cost is indeed free and they are filling now  I called the patient to make her aware   In the future she could get a healthwell grant for cardiomyopathy since she has heart failure per 09/04/23 note

## 2023-11-28 ENCOUNTER — Encounter: Admitting: Cardiology

## 2023-11-28 ENCOUNTER — Encounter: Payer: Self-pay | Admitting: Internal Medicine

## 2023-11-28 ENCOUNTER — Other Ambulatory Visit (HOSPITAL_COMMUNITY): Payer: Self-pay

## 2023-11-29 ENCOUNTER — Encounter: Payer: Self-pay | Admitting: Cardiology

## 2023-11-29 ENCOUNTER — Ambulatory Visit: Attending: Cardiology | Admitting: Cardiology

## 2023-11-29 VITALS — BP 137/74 | HR 84 | Wt 294.5 lb

## 2023-11-29 DIAGNOSIS — I4891 Unspecified atrial fibrillation: Secondary | ICD-10-CM | POA: Insufficient documentation

## 2023-11-29 DIAGNOSIS — J84112 Idiopathic pulmonary fibrosis: Secondary | ICD-10-CM | POA: Insufficient documentation

## 2023-11-29 DIAGNOSIS — N183 Chronic kidney disease, stage 3 unspecified: Secondary | ICD-10-CM

## 2023-11-29 DIAGNOSIS — Z7901 Long term (current) use of anticoagulants: Secondary | ICD-10-CM | POA: Diagnosis not present

## 2023-11-29 DIAGNOSIS — I2510 Atherosclerotic heart disease of native coronary artery without angina pectoris: Secondary | ICD-10-CM | POA: Diagnosis not present

## 2023-11-29 DIAGNOSIS — N189 Chronic kidney disease, unspecified: Secondary | ICD-10-CM | POA: Diagnosis not present

## 2023-11-29 DIAGNOSIS — I2722 Pulmonary hypertension due to left heart disease: Secondary | ICD-10-CM | POA: Insufficient documentation

## 2023-11-29 DIAGNOSIS — I5030 Unspecified diastolic (congestive) heart failure: Secondary | ICD-10-CM | POA: Diagnosis not present

## 2023-11-29 DIAGNOSIS — Z86711 Personal history of pulmonary embolism: Secondary | ICD-10-CM | POA: Insufficient documentation

## 2023-11-29 DIAGNOSIS — I5032 Chronic diastolic (congestive) heart failure: Secondary | ICD-10-CM

## 2023-11-29 DIAGNOSIS — Z79899 Other long term (current) drug therapy: Secondary | ICD-10-CM | POA: Insufficient documentation

## 2023-11-29 DIAGNOSIS — I272 Pulmonary hypertension, unspecified: Secondary | ICD-10-CM

## 2023-11-29 MED ORDER — ALBUTEROL SULFATE HFA 108 (90 BASE) MCG/ACT IN AERS: 2.0000 | INHALATION_SPRAY | Freq: Four times a day (QID) | RESPIRATORY_TRACT | 0 refills | Status: AC | PRN

## 2023-11-29 NOTE — Patient Instructions (Signed)
 Medication Changes:  The heart failure pharmacist is working on starting you on Riociguat.   Follow-Up in: Please follow up with Advanced Heart Failure Clinic in 3 month with Dr. Gardenia.   Thank you for choosing [facility].    At the Advanced Heart Failure Clinic, you and your health needs are our priority. We have a designated team specialized in the treatment of Heart Failure. This Care Team includes your primary Heart Failure Specialized Cardiologist (physician), Advanced Practice Providers (APPs- Physician Assistants and Nurse Practitioners), and Pharmacist who all work together to provide you with the care you need, when you need it.   You may see any of the following providers on your designated Care Team at your next follow up:  Dr. Toribio Fuel Dr. Ezra Shuck Dr. Ria Gardenia Dr. [name] Ellouise Class, FNP Jaun Bash, RPH-CPP  Please be sure to bring in all your medications bottles to every appointment.   Need to Contact Us :  If you have any questions or concerns before your next appointment please send us  a message through [place] or call our office at [phone].    TO LEAVE A MESSAGE FOR THE NURSE SELECT OPTION 2, PLEASE LEAVE A MESSAGE INCLUDING: YOUR NAME DATE OF BIRTH CALL BACK NUMBER REASON FOR CALL**this is important as we prioritize the call backs  YOU WILL RECEIVE A CALL BACK THE SAME DAY AS LONG AS YOU CALL BEFORE 4:00 PM

## 2023-11-29 NOTE — Progress Notes (Signed)
 ADVANCED HEART FAILURE FOLLOW UP CLINIC NOTE  Referring Physician: Stanton Lynwood FALCON, MD  Primary Care: Stanton Lynwood FALCON, MD Primary Cardiologist:  HPI: Maria Gutierrez is a 83 y.o. female who presents for follow up of pulmonary hypertension.      Patient was admitted 6/24 for acute pulmonary embolism, requiring mechanical thrombectomy.  Was also found to be in atrial fibrillation with RVR requiring amiodarone  and metoprolol .  She was discharged on medical therapy and anticoagulation.  Subsequent follow-ups were significant for worsening shortness of breath.  She has series of normal stress test and subsequently a right and left heart catheterization that showed mild CAD, moderate group 2 pulmonary hypertension and elevated filling pressures.  She was referred to heart failure clinic for worsening symptoms.  With also seen by pulmonary clinic on 7/3 at which point in time she had autoimmune workup, high-resolution CT was ordered.  She was unable to complete PFTs. Has a loop recorder in place given history of atrial fibrillation.      SUBJECTIVE:  Remains significantly short of breath.  Should hopefully have riociguat delivered in the near future.  We discussed that her pulmonary hypertension is likely multifactorial including suspected group 3 component with fibrosis on CT imaging as well as suspected sleep apnea.  However, given the lack of significant changes in her CT scan 2016 and acute worsening of her symptoms, I suspect that she has a significant group IV component as well.   PMH, current medications, allergies, social history, and family history reviewed in epic.  PHYSICAL EXAM: Vitals:   11/29/23 1047  BP: 137/74  Pulse: 84  SpO2: 97%   GENERAL: chronically ill appearing PULM:  Normal work of breathing, clear to auscultation bilaterally. Respirations are unlabored.  CARDIAC:  JVP: mildly elevated         Normal rate with regular rhythm. No murmurs, rubs or gallops.  Trace  LE edema. Warm and well perfused extremities. ABDOMEN: Soft, non-tender, non-distended. NEUROLOGIC: Patient is oriented x3 with no focal or lateralizing neurologic deficits.      DATA REVIEW  ECG: 08/04/23: NSR, nonspecific ST abnormality   ECHO: 06/29/2023: LVEF 55 to 60%, grade 1 diastolic dysfunction, normal RV systolic function   CATH: 07/11/2023: Mild nonobstructive coronary disease, RA 15, PA 70/35 (47), PCWP 32, normal cardiac output and index by Fick and thermodilution 10/31/2023: RA 6, PA 40/21 (27), PCWP 8, TD CO/CI 5.8/2.61, PVR 3.2 Wood units, mild bilateral segmental pulmonary disease seen on angiography   ASSESSMENT & PLAN:  HFpEF: Volume status significantly improved based on repeat right heart catheterization.  PA pressure significantly improved as well, but remained moderately elevated. - Continue spironolactone  25 mg daily - Continue Lasix  40 mg daily - May potentially be able to restart SGLT2 in the future and back off her Lasix  given normal filling pressures - Continue metoprolol  50 mg twice daily   Pulmonary hypertension: V/Q scan markedly abnormal suggesting some element of chronic disease given as she has been on anticoagulation. 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  Mild disease on pulmonary angiography, suspect given her age and mild PA pressures would not be an ideal surgical candidate.  Will manage medically by starting low-dose riociguat after discussion with pharmacy.  Would also benefit from pulmonary function test. - Start riociguat, discussion with pharmacy - Continue eliquis  5mg  BID - diuresis as above - Follow-up with pulmonology   CKD: - Off  SGLT-2 consider retrial in the future   PE: Prior history. - Continue apixaban  5mg  BID   Afib: Currently in NSR. - Continue apixaban       [name], MD Advanced Heart Failure Mechanical Circulatory  Support 11/29/23

## 2023-12-04 ENCOUNTER — Encounter: Payer: Self-pay | Admitting: Student in an Organized Health Care Education/Training Program

## 2023-12-04 ENCOUNTER — Ambulatory Visit: Admitting: Student in an Organized Health Care Education/Training Program

## 2023-12-04 VITALS — BP 128/62 | HR 66 | Temp 97.1°F | Ht 63.0 in | Wt 292.8 lb

## 2023-12-04 DIAGNOSIS — I2724 Chronic thromboembolic pulmonary hypertension: Secondary | ICD-10-CM

## 2023-12-04 DIAGNOSIS — J849 Interstitial pulmonary disease, unspecified: Secondary | ICD-10-CM | POA: Diagnosis not present

## 2023-12-04 DIAGNOSIS — I272 Pulmonary hypertension, unspecified: Secondary | ICD-10-CM | POA: Diagnosis not present

## 2023-12-04 DIAGNOSIS — I5032 Chronic diastolic (congestive) heart failure: Secondary | ICD-10-CM | POA: Diagnosis not present

## 2023-12-04 NOTE — Progress Notes (Signed)
 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 Echo at that time showed a dilated RV with elevated PASP, with repeat TTE in April of 2025 showing normalization of RV size and function. She is on anti-coagulation with Apixaban  (for afib and PE).   She had a right heart cath that showed decompensated heart failure, with significantly elevated PCWP back in May of 2025. Perfusion scan was consistent with CTEPH, and she underwent repeat RHC 10/2023 that showed improvement in post capillary pressures, but her pressures remain elevated and are consistent with pre-capillary pulmonary hypertension.  Her pulmonary hypertension is overall multi-factorial, with previous right heart cath showing pre and post capillary pulmonary hypertension. With diuresis, the post capillary component is improved and the RHC showed a pre-capillary component. This is likely Group 3 and 4.  Pulmonary HTN is driven by CTEPH as well as by underlying ILD and OSA. She was unable to perform her PFT's during our last visit, and she does not want to proceed with sleep apnea testing and would not want to wear a NIPPV mask. She was started on Riociguat by Dr. Zenaida from advanced heart failure which she has yet to receive.   I have personally reviewed her chest CT, and there is peripheral subpleural reticulation that is confirmed on her HRCT, reported to be stable since 2016. I would consider adding inhaled treprostinil after Riociguat is initated and titrated, in coordination with cardiology. We will re-attempt her PFT's in the future and will continue to monitor her HRCT to ensure stability. ANA was mildly elevated but I do not think she has ILD with auto-immune features at this point.   Finally, she is indicated for life long anti-coagulation (extended phase for VTE and  for Afib) so long as her risk of bleeding doesn't increase.  - Conduct a walking test to assess oxygen saturation during exertion. - Prescribe supplemental oxygen if saturation drops below qualifying levels during the walking test. - Start Riociguat, titrate up in coordination with cardiology - Consider initiation of inhaled treprostinil   #OSA  Suspected obstructive sleep apnea potentially exacerbating pulmonary hypertension. She is reluctant to pursue further testing due to negative past experiences. Treatment could improve pulmonary hypertension, but she is undecided about further evaluation or treatment. - Encourage reconsideration of a home sleep study to evaluate for obstructive sleep apnea. - Discuss potential benefits of treating sleep apnea on pulmonary hypertension.  Return in about 3 months (around 03/05/2024).  I spent 40 minutes caring for this patient today, including preparing to see the patient, obtaining a medical history , reviewing a separately obtained history, performing a medically appropriate examination and/or evaluation, counseling and educating the patient/family/caregiver, ordering medications, tests, or procedures, documenting clinical information in the electronic health record, and independently interpreting results (not separately reported/billed) and communicating results to the patient/family/caregiver  Belva November, MD [facility]   End of visit medications:  No orders of the defined types were placed in this encounter.    Current Outpatient Medications:    acetaminophen  (TYLENOL ) 325 MG tablet, Take 2 tablets (650 mg total) by mouth every 6 (six) hours as needed for mild pain or fever., Disp: 20 tablet, Rfl: 0   albuterol  (VENTOLIN  HFA) 108 (90 Base) MCG/ACT inhaler, Inhale 2 puffs into the lungs every 6 (six) hours as needed for wheezing or shortness  of breath., Disp: 8 g, Rfl: 0   apixaban  (ELIQUIS ) 5 MG TABS tablet, Take 1 tablet  by mouth twice daily, Disp: 60 tablet, Rfl: 5   calcium  carbonate (OS-CAL) 600 MG TABS tablet, Take 600 mg by mouth daily., Disp: , Rfl:    cyclobenzaprine (FLEXERIL) 10 MG tablet, Take 10 mg by mouth at bedtime as needed for muscle spasms., Disp: , Rfl:    fluticasone  (FLONASE ) 50 MCG/ACT nasal spray, Place 1 spray into both nostrils daily as needed for rhinitis., Disp: , Rfl:    furosemide  (LASIX ) 40 MG tablet, Take 1 tablet (40 mg total) by mouth daily., Disp: 30 tablet, Rfl: 5   levothyroxine (SYNTHROID) 112 MCG tablet, Take 112 mcg by mouth daily before breakfast., Disp: , Rfl:    metoprolol  tartrate (LOPRESSOR ) 50 MG tablet, Take 1 tablet (50 mg total) by mouth 2 (two) times daily., Disp: 180 tablet, Rfl: 3   Multiple Vitamin (MULTIVITAMIN WITH MINERALS) TABS tablet, Take 1 tablet by mouth daily., Disp: , Rfl:    simvastatin  (ZOCOR ) 40 MG tablet, Take 40 mg by mouth at bedtime. , Disp: , Rfl:    spironolactone  (ALDACTONE ) 25 MG tablet, Take 1 tablet (25 mg total) by mouth daily., Disp: 90 tablet, Rfl: 3   venlafaxine  XR (EFFEXOR -XR) 75 MG 24 hr capsule, TAKE 1 CAPSULE BY MOUTH DAILY  WITH BREAKFAST, Disp: 90 capsule, Rfl: 3   Vitamin D , Ergocalciferol , (DRISDOL ) 1.25 MG (50000 UNIT) CAPS capsule, TAKE 1 CAPSULE BY MOUTH EVERY 7  DAYS TAKE ON MONDAYS, Disp: 15 capsule, Rfl: 2 No current facility-administered medications for this visit.  Facility-Administered Medications Ordered in Other Visits:    sodium chloride  0.9 % injection 10 mL, 10 mL, Intravenous, PRN, Choksi, Janak, MD, 10 mL at 01/27/15 1416   sodium chloride  flush (NS) 0.9 % injection 10 mL, 10 mL, Intravenous, PRN, Brahmanday, Govinda R, MD, 10 mL at 12/10/15 1347   Subjective:   PATIENT ID: Maria Gutierrez: female DOB: May 06, 1940, MRN: [id number]  Chief Complaint  Patient presents with   Shortness of Breath    Breathing is not any better. SOD, wheezing and cough.     HPI  Patient is a pleasant 83 year old female  presenting for follow up.   Patient's symptoms started around June 2024.  At that time, she was admitted to the hospital with venous thromboembolism and was found to have a pulmonary embolus.  Given RV dilation, she underwent thrombectomy with vascular surgery.  During that admission, she was also found to have atrial fibrillation and was started on metoprolol  and amiodarone  given rapid ventricular response.  She was discharged on apixaban .    Initial Visit 03/24/2023:   She experienced shortness of breath with exertion following this episode which has only mildly improved since.  Prior to this episode, she did not have any symptoms to speak of.  She did not have any shortness of breath before June 2024 nor did she have any chest pain, chest tightness, palpitations, or cough.  At this point, she experiences shortness of breath with minimal exertion as well as palpitations.  She experiences a fluttering sensation in her chest.  She does not have any cough, chest pain, chest tightness, sputum production, fevers, chills, night sweats, weight loss, or hemoptysis. She does report history of osteoarthritis and gout.   Patient has been followed closely by cardiology, with a most recent visit in December 2024.  She has had a loop recorder placed and continues  on metoprolol , amiodarone , and Eliquis .  Pharmacological myocardial perfusion stress test noted small, mild, fixed mid inferior lateral and apical lateral defect.  Repeat CT scan of the chest did not show any residual pulmonary embolism.  Given persistent symptoms, she was referred to pulmonary for further evaluation.   Return Visit 08/30/2023:   She was unable to get her PFT's done today and attempt was aborted. She continues to experience similar symptoms with shortness of breath as well as a cough. She reports snoring at night and awakes tired. She did undergo LHC and RHC in May of 2025.   Return Visit 12/04/2023:   Discussed the use of AI scribe  software for clinical note transcription with the patient, who gave verbal consent to proceed.  She is reporting worsening shortness of breath. She experiences significant shortness of breath with minimal exertion, requiring frequent pauses to catch her breath. This symptom has persisted for over a month without improvement. She had her RHC and pulmonary angiography with Dr. Zenaida, and was started on Riociguat. She awaits the delivery of the Riociguat. She is also on Furosemide  for diuresis. She has attempted sleep studies twice in the past but was unable to complete them due to discomfort and concern for sinus infections she saw in her husband. She is hesitant to pursue further evaluation or treatment for suspected sleep apnea.    RHC 07/11/2023: RA 15, RV 70/20, PA 70/35 (47), PCWP 32, CO/CI 4.9/2.2, PVR 3.1 wood. RHC 10/31/2023 ; RA 6, RV 40/1, PA 42/21 (27), PCWP 8, CO/CI 6.44/2.88, PVR 3.16 wood Pulmonary Angiography 10/31/2023: Difficult angiogram given limited contrast and difficulty with breath hold. Evidence of mild segmental disease, mainly in the RUL and LLL.   Patient denies any history of smoking or any other inhalational exposure.  She does not have any occupational exposures.  Ancillary information including prior medications, full medical/surgical/family/social histories, and PFTs (when available) are listed below and have been reviewed.   Review of Systems  Constitutional:  Negative for chills, fever and weight loss.  Respiratory:  Positive for cough and shortness of breath. Negative for hemoptysis, sputum production and wheezing.   Cardiovascular:  Negative for chest pain.     Objective:   Vitals:   12/04/23 1126  BP: 128/62  Pulse: 66  Temp: (!) 97.1 F (36.2 C)  SpO2: 93%  Weight: 292 lb 12.8 oz (132.8 kg)  Height: 5' 3 (1.6 m)   93% on RA  BMI Readings from Last 3 Encounters:  12/04/23 51.87 kg/m  11/29/23 52.17 kg/m  11/14/23 51.80 kg/m   Wt Readings from  Last 3 Encounters:  12/04/23 292 lb 12.8 oz (132.8 kg)  11/29/23 294 lb 8 oz (133.6 kg)  11/14/23 292 lb 6.4 oz (132.6 kg)    Physical Exam Constitutional:      Appearance: She is obese. She is not ill-appearing.  HENT:     Mouth/Throat:     Comments: Crowded oropharynx Neck:     Comments: Increased neck circumference Cardiovascular:     Rate and Rhythm: Normal rate and regular rhythm.     Pulses: Normal pulses.     Heart sounds: Normal heart sounds.  Pulmonary:     Effort: Pulmonary effort is normal.     Breath sounds: Rales (mild bibasilar) present.  Musculoskeletal:     Right lower leg: Edema present.     Left lower leg: Edema present.  Neurological:     General: No focal deficit present.  Mental Status: She is alert and oriented to person, place, and time. Mental status is at baseline.       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 extensive intermediate grade DCIS.    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   DDD (degenerative disc disease), lumbar    Dyspnea    GERD (gastroesophageal reflux disease)    Heart murmur    History of blood transfusion    Hyperlipidemia    Hypertension    Personal history of chemotherapy 2016   right breast ca   Personal history of radiation therapy 2016   mammosite   SOB (shortness of breath) 12/03/2014     Family History  Problem Relation Age of Onset   Heart disease Sister    Breast cancer Neg Hx      Past Surgical History:  Procedure Laterality Date   ABDOMINAL HYSTERECTOMY     AXILLARY LYMPH NODE BIOPSY Right 07/14/2014   Procedure: AXILLARY LYMPH NODE BIOPSY;  Surgeon: Reyes LELON Cota, MD;  Location: [facility];   Service: General;  Laterality: Right;   BACK SURGERY  12/29/2020   BOTOX  INJECTION N/A 01/13/2020   Procedure: BOTOX  INJECTION;  Surgeon: Penne Knee, MD;  Location: [facility];  Service: Urology;  Laterality: N/A;   BREAST BIOPSY Left 2012   benign   BREAST BIOPSY Right 05/26/2014   DCIS    BREAST EXCISIONAL BIOPSY Right 06/28/2017    ULCERATED SKIN WITH UNDERLYING FAT NECROSIS, ACUTE INFLAMMATION AND REACTIVE EPITHELIAL ATYPIA   BREAST LUMPECTOMY Right 06/10/2014   DCIS and Invasive ductal carcinoma, clear margins   BREAST SURGERY Right 06/10/2014   Wide excision for intermediate grade DCIS, identification of a 10  millimeter area of invasive mammary carcinoma.   CHOLECYSTECTOMY  2012   COLONOSCOPY  2009   EYE SURGERY Bilateral 2014   cataract   IR KYPHO EA ADDL LEVEL THORACIC OR LUMBAR  02/12/2021   JOINT REPLACEMENT Left 2001   knee   JOINT REPLACEMENT Right 2003   knee   JOINT REPLACEMENT Left 2004   replacement joint broken   kneee surgery Left    PORT-A-CATH REMOVAL  02/11/2016   Dr Cota   PORTACATH PLACEMENT Left 08/27/2014   Procedure: INSERTION PORT-A-CATH;  Surgeon: Reyes LELON Cota, MD;  Location: [facility];  Service: General;  Laterality: Left;   PULMONARY ANGIOGRAPHY N/A 10/31/2023   Procedure: PULMONARY ANGIOGRAPHY;  Surgeon: Zenaida Morene PARAS, MD;  Location: [facility] INVASIVE [facility];  Service: Cardiovascular;  Laterality: N/A;   PULMONARY THROMBECTOMY Bilateral 08/02/2022   Procedure: PULMONARY THROMBECTOMY;  Surgeon: Jama Cordella MATSU, MD;  Location: [facility] IN[facility];  Service: Cardiovascular;  Laterality: Bilateral;   RIGHT HEART CATH Right 10/31/2023   Procedure: RIGHT HEART CATH;  Surgeon: Zenaida Morene PARAS, MD;  Location: [facility] INV[facility] CV LAB;  Service: Cardiovascular;  Laterality: Right;   RIGHT HEART CATH AND CORONARY ANGIOGRAPHY Bilateral 07/11/2023   Procedure: RIGHT HEART CATH AND CORONARY ANGIOGRAPHY;  Surgeon: Mady Bruckner, MD;  Location: [facility]  INVASIVE CV LAB;  Service: Cardiovascular;  Laterality: Bilateral;    Social History   Socioeconomic History   Marital  status: Married    Spouse name: Seena   Number of children: 1   Years of education: Not on file   Highest education level: Not on file  Occupational History   Occupation: house cleaner    Comment: retired  Tobacco Use   Smoking status: Never   Smokeless tobacco: Never  Substance and Sexual Activity   Alcohol use: No    Alcohol/week: 0.0 standard drinks of alcohol   Drug use: No   Sexual activity: Not Currently  Other Topics Concern   Not on file  Social History Narrative   Patient lives with husband and feels safe in her home.   Remains active, drives and is self sufficient.   Social Drivers of Corporate investment banker Strain: Low Risk  (04/07/2023)   Received from [facility]   Overall Financial Resource Strain (CARDIA)    Difficulty of Paying Living Expenses: Not very hard  Food Insecurity: No Food Insecurity (04/07/2023)   Received from [facility]   Hunger Vital Sign    Within the past 12 months, you worried that your food would run out before you got the money to buy more.: Never true    Within the past 12 months, the food you bought just didn't last and you didn't have money to get more.: Never true  Transportation Needs: No Transportation Needs (04/07/2023)   Received from [facility] - Transportation    In the past 12 months, has lack of transportation kept you from medical appointments or from getting medications?: No    Lack of Transportation (Non-Medical): No  Physical Activity: Not on file  Stress: Not on file  Social Connections: Not on file  Intimate Partner Violence: Not At Risk (07/31/2022)   Humiliation, Afraid, Rape, and Kick questionnaire    Fear of Current or Ex-Partner: No    Emotionally Abused: No    Physically Abused: No    Sexually Abused: No     No Known  Allergies   CBC    Component Value Date/Time   WBC 9.3 10/31/2023 0753   RBC 4.49 10/31/2023 0753   HGB 13.6 10/31/2023 0941   HGB 14.0 07/06/2023 1559   HCT 40.0 10/31/2023 0941   HCT 41.8 07/06/2023 1559   PLT 200 10/31/2023 0753   PLT 240 07/06/2023 1559   MCV 95.8 10/31/2023 0753   MCV 101 (H) 07/06/2023 1559   MCV 94 06/05/2014 0843   MCH 31.6 10/31/2023 0753   MCHC 33.0 10/31/2023 0753   RDW 12.8 10/31/2023 0753   RDW 11.7 07/06/2023 1559   RDW 13.3 06/05/2014 0843   LYMPHSABS 2.7 06/20/2023 1303   LYMPHSABS 1.8 06/05/2014 0843   MONOABS 0.7 06/20/2023 1303   MONOABS 0.8 06/05/2014 0843   EOSABS 0.1 06/20/2023 1303   EOSABS 0.1 06/05/2014 0843   BASOSABS 0.1 06/20/2023 1303   BASOSABS 0.0 06/05/2014 0843    Pulmonary Functions Testing Results:     No data to display          Outpatient Medications Prior to Visit  Medication Sig Dispense Refill   acetaminophen  (TYLENOL ) 325 MG tablet Take 2 tablets (650 mg total) by mouth every 6 (six) hours as needed for mild pain or fever. 20 tablet 0   albuterol  (VENTOLIN  HFA) 108 (90 Base) MCG/ACT inhaler Inhale 2 puffs into the lungs every 6 (six) hours as needed for wheezing or shortness of breath. 8 g 0  apixaban  (ELIQUIS ) 5 MG TABS tablet Take 1 tablet by mouth twice daily 60 tablet 5   calcium  carbonate (OS-CAL) 600 MG TABS tablet Take 600 mg by mouth daily.     cyclobenzaprine (FLEXERIL) 10 MG tablet Take 10 mg by mouth at bedtime as needed for muscle spasms.     fluticasone  (FLONASE ) 50 MCG/ACT nasal spray Place 1 spray into both nostrils daily as needed for rhinitis.     furosemide  (LASIX ) 40 MG tablet Take 1 tablet (40 mg total) by mouth daily. 30 tablet 5   levothyroxine (SYNTHROID) 112 MCG tablet Take 112 mcg by mouth daily before breakfast.     metoprolol  tartrate (LOPRESSOR ) 50 MG tablet Take 1 tablet (50 mg total) by mouth 2 (two) times daily. 180 tablet 3   Multiple Vitamin (MULTIVITAMIN WITH MINERALS) TABS  tablet Take 1 tablet by mouth daily.     simvastatin  (ZOCOR ) 40 MG tablet Take 40 mg by mouth at bedtime.      spironolactone  (ALDACTONE ) 25 MG tablet Take 1 tablet (25 mg total) by mouth daily. 90 tablet 3   venlafaxine  XR (EFFEXOR -XR) 75 MG 24 hr capsule TAKE 1 CAPSULE BY MOUTH DAILY  WITH BREAKFAST 90 capsule 3   Vitamin D , Ergocalciferol , (DRISDOL ) 1.25 MG (50000 UNIT) CAPS capsule TAKE 1 CAPSULE BY MOUTH EVERY 7  DAYS TAKE ON MONDAYS 15 capsule 2   Facility-Administered Medications Prior to Visit  Medication Dose Route Frequency Provider Last Rate Last Admin   sodium chloride  0.9 % injection 10 mL  10 mL Intravenous PRN Choksi, Janak, MD   10 mL at 01/27/15 1416   sodium chloride  flush (NS) 0.9 % injection 10 mL  10 mL Intravenous PRN Brahmanday, Govinda R, MD   10 mL at 12/10/15 1347

## 2023-12-11 ENCOUNTER — Other Ambulatory Visit: Payer: Self-pay | Admitting: Internal Medicine

## 2023-12-11 DIAGNOSIS — Z1231 Encounter for screening mammogram for malignant neoplasm of breast: Secondary | ICD-10-CM

## 2023-12-15 NOTE — Progress Notes (Signed)
 Maria Gutierrez                                          MRN: [id number]   12/15/2023   The [facility] Quality Team Specialist reviewed this patient medical record for the purposes of chart review for care gap closure. The following were reviewed: abstraction for care gap closure-controlling blood pressure.    [facility]BCI Quality Team

## 2023-12-18 ENCOUNTER — Telehealth (HOSPITAL_COMMUNITY): Payer: Self-pay | Admitting: Pharmacist

## 2023-12-18 DIAGNOSIS — M48062 Spinal stenosis, lumbar region with neurogenic claudication: Secondary | ICD-10-CM | POA: Diagnosis not present

## 2023-12-18 DIAGNOSIS — Z79899 Other long term (current) drug therapy: Secondary | ICD-10-CM | POA: Diagnosis not present

## 2023-12-18 DIAGNOSIS — M5416 Radiculopathy, lumbar region: Secondary | ICD-10-CM | POA: Diagnosis not present

## 2023-12-18 NOTE — Telephone Encounter (Signed)
 [facility] called to relay that Maria Gutierrez experienced some symptomatic hypotension with SBP in the 90s over this past weekend. Dr. Zenaida would like to hold Adempas at this time and reevaluate at next appointment. Patient called and informed by [facility].

## 2023-12-19 ENCOUNTER — Telehealth: Payer: Self-pay | Admitting: *Deleted

## 2023-12-19 ENCOUNTER — Other Ambulatory Visit: Payer: Self-pay | Admitting: *Deleted

## 2023-12-19 MED ORDER — VITAMIN D (ERGOCALCIFEROL) 1.25 MG (50000 UNIT) PO CAPS: 50000.0000 [IU] | ORAL_CAPSULE | ORAL | 3 refills | Status: AC

## 2023-12-19 NOTE — Telephone Encounter (Signed)
 Patient called and said that she needs to have her vitamin D  sent over to [facility] on [address]. Vitamin D  Drisdol  1.25( 50,000 units)

## 2023-12-24 ENCOUNTER — Other Ambulatory Visit: Payer: Self-pay | Admitting: Internal Medicine

## 2023-12-29 ENCOUNTER — Encounter: Payer: Self-pay | Admitting: Internal Medicine

## 2024-01-09 ENCOUNTER — Ambulatory Visit
Admission: RE | Admit: 2024-01-09 | Discharge: 2024-01-09 | Disposition: A | Discharge status: Home / Self Care | Source: Ambulatory Visit | Attending: Internal Medicine | Admitting: Internal Medicine

## 2024-01-09 DIAGNOSIS — Z1231 Encounter for screening mammogram for malignant neoplasm of breast: Secondary | ICD-10-CM | POA: Diagnosis not present

## 2024-01-31 ENCOUNTER — Telehealth: Payer: Self-pay | Admitting: Family

## 2024-01-31 ENCOUNTER — Encounter: Admitting: Cardiology

## 2024-01-31 NOTE — Telephone Encounter (Signed)
 Called to confirm/remind patient of their [facility] on [facility]/01/24.   Appointment:   [x] Confirmed  [] Left mess   [] No answer/No voice mail  [] VM Full/unable to leave message  [] Phone not in service  Patient reminded to bring all medications and/or complete list.  Confirmed patient has transportation. Gave directions, instructed to utilize valet parking.

## 2024-02-01 ENCOUNTER — Ambulatory Visit: Attending: Family | Admitting: Family

## 2024-02-01 ENCOUNTER — Encounter: Payer: Self-pay | Admitting: Family

## 2024-02-01 VITALS — BP 127/78 | HR 83 | Wt 293.8 lb

## 2024-02-01 DIAGNOSIS — I272 Pulmonary hypertension, unspecified: Secondary | ICD-10-CM

## 2024-02-01 DIAGNOSIS — G479 Sleep disorder, unspecified: Secondary | ICD-10-CM | POA: Diagnosis not present

## 2024-02-01 DIAGNOSIS — R002 Palpitations: Secondary | ICD-10-CM | POA: Diagnosis not present

## 2024-02-01 DIAGNOSIS — Z86711 Personal history of pulmonary embolism: Secondary | ICD-10-CM | POA: Diagnosis not present

## 2024-02-01 DIAGNOSIS — R600 Localized edema: Secondary | ICD-10-CM | POA: Diagnosis not present

## 2024-02-01 DIAGNOSIS — N183 Chronic kidney disease, stage 3 unspecified: Secondary | ICD-10-CM

## 2024-02-01 DIAGNOSIS — Z79899 Other long term (current) drug therapy: Secondary | ICD-10-CM | POA: Diagnosis not present

## 2024-02-01 DIAGNOSIS — I5032 Chronic diastolic (congestive) heart failure: Secondary | ICD-10-CM

## 2024-02-01 DIAGNOSIS — R0602 Shortness of breath: Secondary | ICD-10-CM | POA: Diagnosis not present

## 2024-02-01 DIAGNOSIS — I5030 Unspecified diastolic (congestive) heart failure: Secondary | ICD-10-CM | POA: Diagnosis not present

## 2024-02-01 DIAGNOSIS — Z7901 Long term (current) use of anticoagulants: Secondary | ICD-10-CM | POA: Diagnosis not present

## 2024-02-01 DIAGNOSIS — R5383 Other fatigue: Secondary | ICD-10-CM | POA: Diagnosis not present

## 2024-02-01 DIAGNOSIS — I480 Paroxysmal atrial fibrillation: Secondary | ICD-10-CM | POA: Diagnosis not present

## 2024-02-01 DIAGNOSIS — N189 Chronic kidney disease, unspecified: Secondary | ICD-10-CM | POA: Diagnosis not present

## 2024-02-01 DIAGNOSIS — I4891 Unspecified atrial fibrillation: Secondary | ICD-10-CM | POA: Diagnosis not present

## 2024-02-01 DIAGNOSIS — I2510 Atherosclerotic heart disease of native coronary artery without angina pectoris: Secondary | ICD-10-CM | POA: Diagnosis not present

## 2024-02-01 NOTE — Progress Notes (Signed)
 ADVANCED HEART FAILURE FOLLOW UP CLINIC NOTE  Referring Physician: Valora Lynwood FALCON, MD  Primary Care: Valora Lynwood FALCON, MD HF provider: Odis Brownie, MD  Chief Complaint: Shortness of breath  HPI: Maria Gutierrez is a 83 y.o. female who presents for follow up of pulmonary hypertension.   Patient was admitted 6/24 for acute pulmonary embolism, requiring mechanical thrombectomy. Was also found to be in atrial fibrillation with RVR requiring amiodarone  and metoprolol . She was discharged on medical therapy and anticoagulation.  Subsequent follow-ups were significant for worsening shortness of breath. She has series of normal stress test and subsequently a right and left heart catheterization that showed mild CAD, moderate group 2 pulmonary hypertension and elevated filling pressures.  She was referred to heart failure clinic for worsening symptoms.  With also seen by pulmonary clinic on 7/3 at which point in time she had autoimmune workup, high-resolution CT was ordered.  She was unable to complete PFTs. Has a loop recorder in place given history of atrial fibrillation.   SUBJECTIVE:  She presents today, with her husband, for a HF follow-up visit with a chief complaint of moderate shortness of breath with exertion. Denies SOB at rest. Has moderate fatigue, palpitations, occasional dizziness in the mornings. Chronic difficulty sleeping. Did not tolerate farxiga  due to not feeling well/ lighheadedness.  Says that she was unable to tolerate riociguat as she felt awful. She voices frustration in all the medication tries and doesn't understand why someone can't just figure this out.   Most likely her pulmonary hypertension is multifactorial including suspected group 3 component with fibrosis on CT imaging as well as suspected sleep apnea.  However, given the lack of significant changes in her CT scan 2016 and acute worsening of her symptoms, she probably also has a significant group IV component as  well.   ROS: All systems negative except what is listed in HPI, PMH and Problem List  PMH, current medications, allergies, social history, and family history reviewed in epic.  PHYSICAL EXAM:  General: Chronically ill appearing. NAD Cor: No JVD. Regular rhythm, rate.  Lungs: clear Abdomen: soft, nontender, nondistended. Extremities: trace pitting edema around ankles Neuro:. Affect pleasant   Vitals:   02/01/24 1100  BP: 127/78  Pulse: 83  SpO2: 99%  Weight: 293 lb 12.8 oz (133.3 kg)   Wt Readings from Last 3 Encounters:  02/01/24 293 lb 12.8 oz (133.3 kg)  12/04/23 292 lb 12.8 oz (132.8 kg)  11/29/23 294 lb 8 oz (133.6 kg)   Lab Results  Component Value Date   CREATININE 1.32 (H) 10/31/2023   CREATININE 1.45 (H) 08/22/2023   CREATININE 1.35 (H) 08/04/2023     DATA REVIEW  ECG: 08/04/23: NSR, nonspecific ST abnormality 11/14/23: NSR nonspecific ST   ECHO: 05/30/2023: LVEF 55 to 60%, grade 1 diastolic dysfunction, normal RV systolic function   CATH: 07/11/2023: Mild nonobstructive coronary disease, RA 15, PA 70/35 (47), PCWP 32, normal cardiac output and index by Fick and thermodilution 10/31/2023: RA 6, PA 40/21 (27), PCWP 8, TD CO/CI 5.8/2.61, PVR 3.2 Wood units, mild bilateral segmental pulmonary disease seen on angiography   ASSESSMENT & PLAN:  HFpEF: Volume status significantly improved based on repeat right heart catheterization.  PA pressure significantly improved as well, but remained moderately elevated. - Continue Lasix  40 mg daily - Continue metoprolol  50 mg twice daily - Continue spironolactone  25 mg daily - had intolerance to farxiga  but would be willing to try jardiance. Would need to decrease lasix   if we trial this. Will defer until we see if she can tolerate inhaled treposteril for her pHTN - weight stable from last visit here 2 months ago   Pulmonary hypertension: V/Q scan markedly abnormal suggesting some element of chronic disease given as she has  been on anticoagulation. 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  Mild disease on pulmonary angiography, suspect given her age and mild PA pressures would not be an ideal surgical candidate.  She was unable to tolerated riociguat as she says that it made her feel awful.  - After discussing with Dr Zenaida, will begin Inhaled tyvaso 16mcg. Since she has intolerances to medications recently tried, will start it as TID. Titrate up as tolerated - Continue eliquis  5mg  BID - Saw pulmonology (Dgayli) 10/25 - not interested in getting sleep study re-done due to discomfort as well as her husband had chronic sinus infections with his.  - unable to complete PFT's 07/25 due to symptoms   CKD: - Off SGLT-2 consider retrial in the future - BMET 10/31/23 reviewed: sodium 142, potassium 4.2, creatinine 1.32, GFR 40   PE: Prior history. - Continue apixaban  5mg  BID   Afib:  - rate controlled today - Continue apixaban     Return in 2-3 weeks to meet with HF pharm for titration of tyvaso, sooner if needed.   I spent 38 minutes reviewing records, interviewing/ examing patient and managing plan/ orders.   [name] Class [facility] 02/01/24

## 2024-02-01 NOTE — Patient Instructions (Signed)
 Medication Changes:  The pharmacist will be in contact with you about your Tyvaso prescription   Follow-Up in: Please follow up with the [facility] pharmacist in 3 weeks and gain with Dr. Zenaida in [facility] February of 2026.   Thank you for choosing [facility].    At the [facility] Clinic, you and your health needs are our priority. We have a designated team specialized in the treatment of Heart Failure. This Care Team includes your primary Heart Failure Specialized Cardiologist (physician), Advanced Practice Providers (APPs- Physician Assistants and Nurse Practitioners), and Pharmacist who all work together to provide you with the care you need, when you need it.   You may see any of the following providers on your designated Care Team at your next follow up:  Dr. Toribio Fuel Dr. Ezra Shuck Dr. Ria Commander Dr. Morene Zenaida Ellouise Class, FNP Jaun Bash, RPH-CPP  Please be sure to bring in all your medications bottles to every appointment.   Need to Contact Us :  If you have any questions or concerns before your next appointment please send us  a message through [place] or call our office at [phone].    TO LEAVE A MESSAGE FOR THE NURSE SELECT OPTION 2, PLEASE LEAVE A MESSAGE INCLUDING: YOUR NAME DATE OF BIRTH CALL BACK NUMBER REASON FOR CALL**this is important as we prioritize the call backs  YOU WILL RECEIVE A CALL BACK THE SAME DAY AS LONG AS YOU CALL BEFORE 4:00 PM

## 2024-02-05 ENCOUNTER — Encounter: Payer: Self-pay | Admitting: Internal Medicine

## 2024-02-05 ENCOUNTER — Other Ambulatory Visit (HOSPITAL_COMMUNITY): Payer: Self-pay

## 2024-02-06 ENCOUNTER — Other Ambulatory Visit (HOSPITAL_COMMUNITY): Payer: Self-pay

## 2024-02-06 ENCOUNTER — Telehealth: Payer: Self-pay

## 2024-02-06 ENCOUNTER — Encounter: Payer: Self-pay | Admitting: Internal Medicine

## 2024-02-08 ENCOUNTER — Other Ambulatory Visit (HOSPITAL_COMMUNITY): Payer: Self-pay

## 2024-02-12 ENCOUNTER — Telehealth: Payer: Self-pay

## 2024-02-12 ENCOUNTER — Ambulatory Visit: Attending: Family | Admitting: Family

## 2024-02-12 ENCOUNTER — Encounter: Payer: Self-pay | Admitting: Family

## 2024-02-12 VITALS — BP 131/39 | HR 105 | Wt 269.2 lb

## 2024-02-12 DIAGNOSIS — I5032 Chronic diastolic (congestive) heart failure: Secondary | ICD-10-CM | POA: Diagnosis not present

## 2024-02-12 DIAGNOSIS — Z86711 Personal history of pulmonary embolism: Secondary | ICD-10-CM | POA: Diagnosis not present

## 2024-02-12 DIAGNOSIS — I2510 Atherosclerotic heart disease of native coronary artery without angina pectoris: Secondary | ICD-10-CM | POA: Diagnosis not present

## 2024-02-12 DIAGNOSIS — N189 Chronic kidney disease, unspecified: Secondary | ICD-10-CM | POA: Diagnosis not present

## 2024-02-12 DIAGNOSIS — I272 Pulmonary hypertension, unspecified: Secondary | ICD-10-CM

## 2024-02-12 DIAGNOSIS — N183 Chronic kidney disease, stage 3 unspecified: Secondary | ICD-10-CM | POA: Diagnosis not present

## 2024-02-12 DIAGNOSIS — I4891 Unspecified atrial fibrillation: Secondary | ICD-10-CM | POA: Diagnosis not present

## 2024-02-12 DIAGNOSIS — R0602 Shortness of breath: Secondary | ICD-10-CM | POA: Diagnosis present

## 2024-02-12 DIAGNOSIS — R059 Cough, unspecified: Secondary | ICD-10-CM | POA: Diagnosis present

## 2024-02-12 DIAGNOSIS — I480 Paroxysmal atrial fibrillation: Secondary | ICD-10-CM | POA: Diagnosis not present

## 2024-02-12 DIAGNOSIS — J40 Bronchitis, not specified as acute or chronic: Secondary | ICD-10-CM

## 2024-02-12 DIAGNOSIS — Z79899 Other long term (current) drug therapy: Secondary | ICD-10-CM | POA: Diagnosis not present

## 2024-02-12 MED ORDER — BENZONATATE 100 MG PO CAPS: 100.0000 mg | ORAL_CAPSULE | Freq: Three times a day (TID) | ORAL | 0 refills | Status: AC | PRN

## 2024-02-12 MED ORDER — DOXYCYCLINE HYCLATE 100 MG PO CAPS: 100.0000 mg | ORAL_CAPSULE | Freq: Two times a day (BID) | ORAL | 0 refills | Status: AC

## 2024-02-12 NOTE — Telephone Encounter (Signed)
 Callers name: self Relation to patient:   Call back phone #:   Pharmacy (if applicable):   Issue/reason for call: pt called to request medication for cough. Pt states that she has been coughing all night and unable to sleep. States that the cough started around the time she was diagnosed with hfpef and but has gotten worse over the last 2 days. She has tried multiple over the counter remedies. States that her sob has increased as well as her swelling. Has not weighed. Would like advice?

## 2024-02-12 NOTE — Patient Instructions (Addendum)
 Medication Changes:  START Tessalon  Perles (1 tab) up to 3 times daily as needed for a cough  START Doxycycline  100mg  (1 tab) two times daily for 10 days only   Follow-Up in: Please follow up with the [facility] Clinic in January 2026 with Dr. Zenaida.    Thank you for choosing [facility].    At the Advanced [facility] Failure Clinic, you and your health needs are our priority. We have a designated team specialized in the treatment of Heart Failure. This Care Team includes your primary Heart Failure Specialized Cardiologist (physician), Advanced Practice Providers (APPs- Physician Assistants and Nurse Practitioners), and Pharmacist who all work together to provide you with the care you need, when you need it.   You may see any of the following providers on your designated Care Team at your next follow up:  Dr. Toribio Fuel Dr. Ezra Shuck Dr. Ria Commander Dr. Morene Zenaida Ellouise Class, FNP Jaun Bash, RPH-CPP  Please be sure to bring in all your medications bottles to every appointment.   Need to Contact Us :  If you have any questions or concerns before your next appointment please send us  a message through [place] or call our office at [phone].    TO LEAVE A MESSAGE FOR THE NURSE SELECT OPTION 2, PLEASE LEAVE A MESSAGE INCLUDING: YOUR NAME DATE OF BIRTH CALL BACK NUMBER REASON FOR CALL**this is important as we prioritize the call backs  YOU WILL RECEIVE A CALL BACK THE SAME DAY AS LONG AS YOU CALL BEFORE 4:00 PM

## 2024-02-12 NOTE — Progress Notes (Signed)
 ADVANCED HEART FAILURE FOLLOW UP CLINIC NOTE  Referring Physician: Valora Lynwood FALCON, MD  Primary Care: Valora Lynwood FALCON, MD HF provider: Odis Brownie, MD  Chief Complaint: shortness of breath / cough   HPI: Maria Gutierrez is a 83 y.o. female who presents for follow up of pulmonary hypertension.   Patient was admitted 6/24 for acute pulmonary embolism, requiring mechanical thrombectomy. Was also found to be in atrial fibrillation with RVR requiring amiodarone  and metoprolol . She was discharged on medical therapy and anticoagulation.  Subsequent follow-ups were significant for worsening shortness of breath. She has series of normal stress test and subsequently a right and left heart catheterization that showed mild CAD, moderate group 2 pulmonary hypertension and elevated filling pressures.  She was referred to heart failure clinic for worsening symptoms.  With also seen by pulmonary clinic on 7/3 at which point in time she had autoimmune workup, high-resolution CT was ordered.  She was unable to complete PFTs. Has a loop recorder in place given history of atrial fibrillation.   SUBJECTIVE:  She presents today, with her husband, for an acute HF visit with a chief complaint of worsening SOB. Has associated dry cough that worsened yesterday. Has intermittent dizziness, pedal edema. Cough is mostly dry with occasional productivity. Has some chest pain but says that it's from coughing so hard. Husband is also sick and will be starting medication from his PCP. She hasn't tried to take any OTC medications because she didn't know what to take. Unsure if she has any fevers and doesn't think she's been having chills. Has not been able to sleep due to coughing.   Did not tolerate farxiga  due to not feeling well/ lighheadedness.  Says that she was unable to tolerate riociguat as she felt awful. She voices frustration in all the medication tries and doesn't understand why someone can't just figure this  out.   Most likely her pulmonary hypertension is multifactorial including suspected group 3 component with fibrosis on CT imaging as well as suspected sleep apnea.  However, given the lack of significant changes in her CT scan 2016 and acute worsening of her symptoms, she probably also has a significant group IV component as well.   ROS: All systems negative except what is listed in HPI, PMH and Problem List  PMH, current medications, allergies, social history, and family history reviewed in epic.  PHYSICAL EXAM:  General: Chronically ill appearing female in a wheelchair. Harsh, barky cough  Cor: No JVD. Regular rhythm, tachycardic.  Lungs: expiratory wheezing in bilateral lower lobes. No rales/ rhonchi Abdomen: soft, nontender, nondistended. Extremities: trace pitting edema around ankles Neuro:. Affect pleasant  Vitals:   02/12/24 1354  BP: (!) 131/39  Pulse: (!) 105  SpO2: 94%  Weight: 269 lb 3.2 oz (122.1 kg)   Wt Readings from Last 3 Encounters:  02/12/24 269 lb 3.2 oz (122.1 kg)  02/01/24 293 lb 12.8 oz (133.3 kg)  12/04/23 292 lb 12.8 oz (132.8 kg)   Lab Results  Component Value Date   CREATININE 1.32 (H) 10/31/2023   CREATININE 1.45 (H) 08/22/2023   CREATININE 1.35 (H) 08/04/2023     DATA REVIEW  ECG: 08/04/23: NSR, nonspecific ST abnormality 11/14/23: NSR nonspecific ST   ECHO: 05/30/2023: LVEF 55 to 60%, grade 1 diastolic dysfunction, normal RV systolic function   CATH: 07/11/2023: Mild nonobstructive coronary disease, RA 15, PA 70/35 (47), PCWP 32, normal cardiac output and index by Fick and thermodilution 10/31/2023: RA 6, PA 40/21 (27),  PCWP 8, TD CO/CI 5.8/2.61, PVR 3.2 Wood units, mild bilateral segmental pulmonary disease seen on angiography   ASSESSMENT & PLAN:  HFpEF: Volume status significantly improved based on repeat right heart catheterization.  PA pressure significantly improved as well, but remained moderately elevated. NYHA class III. Euvolemic  today - Continue Lasix  40 mg daily - Continue metoprolol  50 mg twice daily for rate control - Continue spironolactone  25 mg daily - had intolerance to farxiga  but would be willing to try jardiance. Would need to decrease lasix  if we trial this. Will defer until we see if she can tolerate inhaled treposteril for her pHTN   Pulmonary hypertension: V/Q scan markedly abnormal suggesting some element of chronic disease given as she has been on anticoagulation. 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  Mild disease on pulmonary angiography, suspect given her age and mild PA pressures would not be an ideal surgical candidate.  She was unable to tolerated riociguat as she says that it made her feel awful.  - After discussing with Dr Zenaida, will begin Inhaled tyvaso 16mcg. Since she has intolerances to medications recently tried, will start it as TID. Titrate up as tolerated. Still waiting on insurance approval.  - Continue eliquis  5mg  BID - Saw pulmonology (Dgayli) 10/25 - not interested in getting sleep study re-done due to discomfort as well as her husband had chronic sinus infections with his.  - unable to complete PFT's 07/25 due to symptoms   CKD: - Off SGLT-2 consider retrial in the future - BMET 10/31/23 reviewed: sodium 142, potassium 4.2, creatinine 1.32, GFR 40   PE: Prior history. - Continue apixaban  5mg  BID   Afib:  - rate controlled today - Continue apixaban    Bronchitis: - will treat with tessalon  perles TID as needed - begin doxycycline  100mg  BID X 10 days. If not improvement, f/u with PCP   Return in 2 weeks at already scheduled appointment, sooner if needed.   I spent 20 minutes reviewing records, interviewing/ examing patient and managing plan/ orders.   [name] Class [facility] 02/12/2024

## 2024-02-12 NOTE — Telephone Encounter (Signed)
 Advanced Heart Failure Patient Advocate Encounter  Prior authorization for Tyvaso [id number]) initially denied as documentation of diagnosis, and trial / failure of formulary alternative(s) were not sufficient. Pharmacist is aware and is working on first level appeal forms.

## 2024-02-13 ENCOUNTER — Other Ambulatory Visit (HOSPITAL_COMMUNITY): Payer: Self-pay

## 2024-02-13 ENCOUNTER — Encounter: Payer: Self-pay | Admitting: Internal Medicine

## 2024-02-14 NOTE — Telephone Encounter (Signed)
 Appeal faxed to [employer] on 02/14/2024

## 2024-02-15 ENCOUNTER — Other Ambulatory Visit: Payer: Self-pay | Admitting: Medical

## 2024-02-15 DIAGNOSIS — I2699 Other pulmonary embolism without acute cor pulmonale: Secondary | ICD-10-CM

## 2024-02-15 DIAGNOSIS — I4891 Unspecified atrial fibrillation: Secondary | ICD-10-CM

## 2024-02-15 NOTE — Telephone Encounter (Signed)
 Prescription refill request for Eliquis  received. Indication: PAF Last office visit: 02/12/24  ONEIDA Class NP Scr: 1.32 on 10/31/23  Epic Age: 83 Weight: 122.1kg  Based on above findings Eliquis  5mg  twice daily is the appropriate dose.  Refill approved.

## 2024-02-15 NOTE — Telephone Encounter (Signed)
 Refill request

## 2024-02-20 ENCOUNTER — Other Ambulatory Visit (HOSPITAL_COMMUNITY): Payer: Self-pay

## 2024-02-20 ENCOUNTER — Encounter: Payer: Self-pay | Admitting: Internal Medicine

## 2024-02-20 NOTE — Telephone Encounter (Signed)
 Prior authorization for Tyvaso has been approved 02/16/2024 until 02/28/2024. Test billing returns $0 copay.

## 2024-02-26 ENCOUNTER — Telehealth: Payer: Self-pay

## 2024-02-26 NOTE — Telephone Encounter (Signed)
 Advanced Heart Failure Patient Advocate Encounter  Attempted to contact patient to obtain consent for Tyvaso enrollment forms. Patient declined at this time and requested I reach back out to her in apx 1 week. Will continue to follow up.  Rachel DEL, CPhT Rx Patient Advocate Phone: [phone]

## 2024-02-27 ENCOUNTER — Ambulatory Visit

## 2024-03-13 ENCOUNTER — Telehealth: Payer: Self-pay | Admitting: Pharmacist

## 2024-03-13 NOTE — Telephone Encounter (Signed)
 Have called both listed phone numbers multiple times to discuss Tyvaso start with patient, however, she has not answered or returned the calls.

## 2024-03-13 NOTE — Telephone Encounter (Signed)
 Patient expressed concern with medication, declined to start enrollment process at this time. Pharmacist has made multiple attempts to contact patient that have gone unanswered. Will be available for future needs or enrollment process if patient and provider would like to start at a later date.

## 2024-03-15 ENCOUNTER — Encounter: Payer: Self-pay | Admitting: Medical

## 2024-03-15 ENCOUNTER — Ambulatory Visit: Attending: Medical | Admitting: Medical

## 2024-03-15 VITALS — BP 128/80 | HR 69 | Ht 62.0 in | Wt 289.0 lb

## 2024-03-15 DIAGNOSIS — I4891 Unspecified atrial fibrillation: Secondary | ICD-10-CM

## 2024-03-15 DIAGNOSIS — Z86711 Personal history of pulmonary embolism: Secondary | ICD-10-CM | POA: Diagnosis not present

## 2024-03-15 DIAGNOSIS — I5032 Chronic diastolic (congestive) heart failure: Secondary | ICD-10-CM

## 2024-03-15 DIAGNOSIS — I272 Pulmonary hypertension, unspecified: Secondary | ICD-10-CM

## 2024-03-15 DIAGNOSIS — I480 Paroxysmal atrial fibrillation: Secondary | ICD-10-CM | POA: Diagnosis not present

## 2024-03-15 DIAGNOSIS — R0602 Shortness of breath: Secondary | ICD-10-CM | POA: Diagnosis not present

## 2024-03-15 DIAGNOSIS — N183 Chronic kidney disease, stage 3 unspecified: Secondary | ICD-10-CM

## 2024-03-15 MED ORDER — EMPAGLIFLOZIN 10 MG PO TABS: 10.0000 mg | ORAL_TABLET | Freq: Every day | ORAL | 3 refills | Status: AC

## 2024-03-15 NOTE — Patient Instructions (Signed)
 Medication Instructions:  Your physician recommends the following medication changes.  START TAKING: Jardiance  10 mg daily  *If you need a refill on your cardiac medications before your next appointment, please call your pharmacy*  Lab Work: No labs ordered today  If you have labs (blood work) drawn today and your tests are completely normal, you will receive your results only by: MyChart Message (if you have MyChart) OR A paper copy in the mail If you have any lab test that is abnormal or we need to change your treatment, we will call you to review the results.  Testing/Procedures: No test ordered today   Follow-Up: At [facility], you and your health needs are our priority.  As part of our continuing mission to provide you with exceptional heart care, our providers are all part of one team.  This team includes your primary Cardiologist (physician) and Advanced Practice Providers or APPs (Physician Assistants and Nurse Practitioners) who all work together to provide you with the care you need, when you need it.  Your next appointment:   4 month(s)  Call to schedule with Dr. Lenda - Pulmonology  Provider:   You may see Lonni Hanson, MD or one of the following Advanced Practice Providers on your designated Care Team:   Cadence [place]hester, [place]   We recommend signing up for the patient portal called MyChart.  Sign up information is provided on this After Visit Summary.  MyChart is used to connect with patients for Virtual Visits (Telemedicine).  Patients are able to view lab/test results, encounter notes, upcoming appointments, etc.  Non-urgent messages can be sent to your provider as well.   To learn more about what you can do with MyChart, go to [email or web address].

## 2024-03-15 NOTE — Progress Notes (Signed)
 " Cardiology Office Note   Date:  03/15/2024  ID:  GWENNETH WHITEMAN, DOB 10/01/1940, MRN [id number] PCP: Valora Lynwood FALCON, MD  [facility] Providers Cardiologist:  Lonni Hanson, MD    History of Present Illness Maria Gutierrez is a 84 y.o. female  with a hx of  HTN, obesity, depression and anxiety, CKD stage 3, PE in 07/2022, breast cancer s/p radiation and chemotherapy, HFpEF, mild pulmonary hypertension, paroxysmal A-fib, and gout who present for follow-up of shortness of breath.   The patient was admitted 07/2022 for acute PE, severe sepsis 2/2 PNA, acute respiratiry failure, AKI on CKD stage 3, and new onset Afib. She presented with SOB fount to have bilateral PE started on IV heparin . She was in sinus tach/MAT, and later on developed Afib. Patient underwent mechanical thrombectomy by [facility] on 6/4. Afib rates were uncontrolled with just metoprolol  and she was started on amiodarone . HS troponin was elevated 24>25.  Echo showed LVEF 60-65%, mild to mod hypertrophjy, normal RVSF, mildly elevated pulmonary artery pressure, trivial MR, ascending aorta measuring 36mm. She was transitioned to oral amiodarone  and started on Eliquis  5mg  BID. Follow-up Myoview  Lexiscan  was low risk normal from logical myocardial perfusion test with a small in size, mild in severity, fixed mid inferolateral and apical lateral defect that may represent subtle scarring cannot rule out artifact.  No ischemia noted.  EF 65%.  30 calcification and aortic atherosclerosis were noted on attenuation correction CT.   The patient was seen 01/2023 and reported fatigue. Her inhaler was refilled and she was referred to pulmonology. She saw pulmonology and PFTs and echo were ordered. TSH came back at 69 and amiodarone  was stopped. Thyroid  function is improving.    The patient was seen 05/12/23 and reporting DOE. PCP did a heart monitor that showed no serious arrhythmias. Cardiac PET stress test  showed small area infarction with  per-infarct ischemia, but cannot rule out artifact, intermediate risk study.  She was seen back 07/06/23 reporting persistent symptoms and was set up for R/L heart cath. This showed minimal luminal irregularities in the LAD, no cignificant CAD, moderately to severely elevated left heart, right heart, and pulmonary artery pressures. Lasix  was increased to 40mg  daily.  The patient was referred to advanced heart failure team.  Patient was seen by them and VQ scan was ordered.  This showed abnormality suggesting element of chronic disease.  She did not tolerate Farxiga .  Patient was referred to advanced heart failure team and spironolactone  was added.  Right heart cath Dr. Zenaida 10/31/2023 showed mild pulmonary hypertension.  Patient was started on riociguat. Patient saw pulmonology who ordered autoimmune work-up and home sleep study. She was unable to complete PFTs. Chest CT showed pulmonary fibrosis.   Today, the patient reports she had low BP on Riociguat and stopped this. She was told they want to start Tyvaso. She reports chronic shortness of breath that is the same. She reports she was sick most of December and she was given doxy for 10 days. She denies chest pain. Pharmacy have been trying to get ahold of patient to discuss Tyvaso.   Studies Reviewed      Right heart cath 10/2023 IMPRESSION: Right heart catheterization and pulmonary angiography for evaluation of Group IV PH Mild pulmonary hypertension with elevated PVR 3.2 wood units Normal filling pressures and cardiac output Difficult angiogram given limited contrast and difficult with breath holding. Evidence of mild segmental disease, mainly right upper lobe and left lower lobe  RECOMMENDATIONS: Given significant improvement in pressures will treat medically, plan to start riociguat     R/L heart cath 06/2023 Conclusions: Minimal luminal irregularities noted in the LAD.  No angiographically significant coronary artery disease  identified. Moderately to severely elevated left heart, right heart, and pulmonary artery pressures. Normal to mildly reduced cardiac output/index (Fick CO/CI 4.9 L/min, 2.2 L/min/m^2; thermodilution CO/CI 7.1 L/min, 3.1 L/min/m^2).   Recommendations: Escalate diuresis. Consider referral to advanced heart failure team for further workup and management of HFpEF and pulmonary hypertension. Primary prevention of coronary artery disease. If no evidence of bleeding or vascular injury at catheterization sites, apixaban  can be resumed tomorrow morning.   Lonni Hanson, MD [facility]   Right Heart   Right Heart Pressures RA (mean): 15 mmHg RV (S/EDP): 70/20 mmHg PA (S/D, mean): 70/35 (47) mmHg PCWP (mean): 32 mmHg  Ao sat: 92% PA sat: 55%  Fick CO: 4.9 L/min Fick CI: 2.2 L/min/m^2  Thermodilution CO: 7.1 L/min Thermodilution CI: 3.1 L/min/m^2  PVR:  -Fick: 3.1 Wood units -Thermodilution: 2.1 Wood units    Echo 05/2023 1. Left ventricular ejection fraction, by estimation, is 55 to 60%. The  left ventricle has normal function. The left ventricle has no regional  wall motion abnormalities. Left ventricular diastolic parameters are  consistent with Grade I diastolic  dysfunction (impaired relaxation).   2. Right ventricular systolic function is normal. The right ventricular  size is normal.   3. The mitral valve is degenerative. Trivial mitral valve regurgitation.   4. The aortic valve is tricuspid. Aortic valve regurgitation is not  visualized.      Physical Exam VS:  There were no vitals taken for this visit.       Wt Readings from Last 3 Encounters:  02/12/24 269 lb 3.2 oz (122.1 kg)  02/01/24 293 lb 12.8 oz (133.3 kg)  12/04/23 292 lb 12.8 oz (132.8 kg)    GEN: Well nourished, well developed in no acute distress NECK: No JVD; No carotid bruits CARDIAC: RRR, no murmurs, rubs, gallops RESPIRATORY:  Clear to auscultation without rales, wheezing or rhonchi  ABDOMEN:  Soft, non-tender, non-distended EXTREMITIES:  No edema; No deformity   ASSESSMENT AND PLAN  Chronic SOB HFpEF Patient reports persistent SOB. Prior LHC showed minimal luminal irregularities noted in the LAD, no significant CAD. Most recent RHC showed mild PH, and she was started on Riociguat, but she did not tolerate this. She denies chest pain. She appears euvolemic on exam. We will re-trial SGLT2i with Jardiance  10mg  daily. She has an apt with CHF team next week at which time BMET can be checked and lasix  can be adjusted (if Jardiance  is tolerated). Continue lasix  40mg  daily, spiro 25mg  daily, and metoprolol  50mg  BID.   Pulmonary HTN Idiopathic Pulmonary fibrosis Chest CT showed moderate pulmonary fibrosis. RHC showed mild pulmonary HTN. Patient reports Riociguat caused severe hypotension. ACHF team recommended Tyvaso, but she is reluctant to start this. Pharmacy has been trying to contact the patient to discuss Tyvaso. She has follow up with ACHF team next week. I also recommended follow-up with pulmonology.  CKD stage 3 Scr stable on most recent check.   H/o PE Continue Eliquis  5mg  BID  Afib She is in NSR. Continue Eliquis  5mg  BID.    Dispo: Follow-up in 4 months  Signed, Brynlei Klausner VEAR Fishman, PA-C   "

## 2024-03-25 ENCOUNTER — Ambulatory Visit: Admitting: Cardiology

## 2024-03-28 ENCOUNTER — Ambulatory Visit: Admitting: Cardiology

## 2024-06-19 ENCOUNTER — Other Ambulatory Visit

## 2024-06-19 ENCOUNTER — Ambulatory Visit

## 2024-06-19 ENCOUNTER — Ambulatory Visit: Admitting: Internal Medicine

## 2024-07-01 ENCOUNTER — Ambulatory Visit: Admitting: Cardiology

## 2024-07-16 ENCOUNTER — Ambulatory Visit: Admitting: Medical
